# Patient Record
Sex: Female | Born: 1960 | Hispanic: No | Marital: Married | Smoking: Never smoker
Health system: Southern US, Community
[De-identification: ages and names within clinical notes are randomized; demographics above are authoritative.]

## PROBLEM LIST (undated history)

## (undated) DIAGNOSIS — D649 Anemia, unspecified: Secondary | ICD-10-CM

## (undated) DIAGNOSIS — Z9889 Other specified postprocedural states: Secondary | ICD-10-CM

## (undated) DIAGNOSIS — H269 Unspecified cataract: Secondary | ICD-10-CM

## (undated) DIAGNOSIS — G4730 Sleep apnea, unspecified: Secondary | ICD-10-CM

## (undated) DIAGNOSIS — R112 Nausea with vomiting, unspecified: Secondary | ICD-10-CM

## (undated) DIAGNOSIS — C801 Malignant (primary) neoplasm, unspecified: Secondary | ICD-10-CM

## (undated) DIAGNOSIS — K760 Fatty (change of) liver, not elsewhere classified: Secondary | ICD-10-CM

## (undated) DIAGNOSIS — E119 Type 2 diabetes mellitus without complications: Secondary | ICD-10-CM

## (undated) DIAGNOSIS — I10 Essential (primary) hypertension: Secondary | ICD-10-CM

## (undated) DIAGNOSIS — K219 Gastro-esophageal reflux disease without esophagitis: Secondary | ICD-10-CM

## (undated) DIAGNOSIS — J45909 Unspecified asthma, uncomplicated: Secondary | ICD-10-CM

## (undated) DIAGNOSIS — IMO0001 Reserved for inherently not codable concepts without codable children: Secondary | ICD-10-CM

## (undated) DIAGNOSIS — D259 Leiomyoma of uterus, unspecified: Secondary | ICD-10-CM

## (undated) DIAGNOSIS — M1990 Unspecified osteoarthritis, unspecified site: Secondary | ICD-10-CM

## (undated) DIAGNOSIS — T7840XA Allergy, unspecified, initial encounter: Secondary | ICD-10-CM

## (undated) DIAGNOSIS — Z531 Procedure and treatment not carried out because of patient's decision for reasons of belief and group pressure: Secondary | ICD-10-CM

## (undated) HISTORY — PX: BREAST EXCISIONAL BIOPSY: SUR124

## (undated) HISTORY — DX: Procedure and treatment not carried out because of patient's decision for reasons of belief and group pressure: Z53.1

## (undated) HISTORY — DX: Unspecified osteoarthritis, unspecified site: M19.90

## (undated) HISTORY — DX: Leiomyoma of uterus, unspecified: D25.9

## (undated) HISTORY — DX: Unspecified cataract: H26.9

## (undated) HISTORY — DX: Anemia, unspecified: D64.9

## (undated) HISTORY — DX: Allergy, unspecified, initial encounter: T78.40XA

## (undated) HISTORY — DX: Gastro-esophageal reflux disease without esophagitis: K21.9

## (undated) HISTORY — DX: Type 2 diabetes mellitus without complications: E11.9

## (undated) HISTORY — PX: BREAST CYST ASPIRATION: SHX578

## (undated) HISTORY — DX: Reserved for inherently not codable concepts without codable children: IMO0001

## (undated) HISTORY — DX: Fatty (change of) liver, not elsewhere classified: K76.0

## (undated) HISTORY — PX: COLONOSCOPY: SHX174

---

## 1998-08-21 ENCOUNTER — Other Ambulatory Visit: Admission: RE | Admit: 1998-08-21 | Discharge: 1998-08-21 | Payer: Self-pay | Admitting: Obstetrics and Gynecology

## 1998-08-22 ENCOUNTER — Other Ambulatory Visit: Admission: RE | Admit: 1998-08-22 | Discharge: 1998-08-22 | Payer: Self-pay | Admitting: Obstetrics and Gynecology

## 1998-08-22 ENCOUNTER — Encounter (INDEPENDENT_AMBULATORY_CARE_PROVIDER_SITE_OTHER): Payer: Self-pay | Admitting: Specialist

## 1999-10-16 ENCOUNTER — Other Ambulatory Visit: Admission: RE | Admit: 1999-10-16 | Discharge: 1999-10-16 | Payer: Self-pay | Admitting: Obstetrics and Gynecology

## 2000-11-11 ENCOUNTER — Other Ambulatory Visit: Admission: RE | Admit: 2000-11-11 | Discharge: 2000-11-11 | Payer: Self-pay | Admitting: Obstetrics and Gynecology

## 2000-11-18 ENCOUNTER — Encounter: Admission: RE | Admit: 2000-11-18 | Discharge: 2000-11-18 | Payer: Self-pay | Admitting: Obstetrics and Gynecology

## 2000-11-18 ENCOUNTER — Encounter: Payer: Self-pay | Admitting: Obstetrics and Gynecology

## 2000-12-10 ENCOUNTER — Encounter: Admission: RE | Admit: 2000-12-10 | Discharge: 2000-12-10 | Payer: Self-pay | Admitting: Obstetrics and Gynecology

## 2000-12-10 ENCOUNTER — Encounter: Payer: Self-pay | Admitting: Obstetrics and Gynecology

## 2001-12-15 ENCOUNTER — Other Ambulatory Visit: Admission: RE | Admit: 2001-12-15 | Discharge: 2001-12-15 | Payer: Self-pay | Admitting: Obstetrics and Gynecology

## 2002-12-19 ENCOUNTER — Other Ambulatory Visit: Admission: RE | Admit: 2002-12-19 | Discharge: 2002-12-19 | Payer: Self-pay | Admitting: Obstetrics and Gynecology

## 2004-01-04 ENCOUNTER — Ambulatory Visit: Payer: Self-pay | Admitting: Licensed Clinical Social Worker

## 2004-01-18 ENCOUNTER — Ambulatory Visit: Payer: Self-pay | Admitting: Licensed Clinical Social Worker

## 2004-06-24 ENCOUNTER — Ambulatory Visit: Payer: Self-pay | Admitting: Internal Medicine

## 2004-07-02 ENCOUNTER — Ambulatory Visit: Payer: Self-pay | Admitting: Internal Medicine

## 2004-09-01 ENCOUNTER — Ambulatory Visit: Payer: Self-pay | Admitting: Internal Medicine

## 2004-09-02 ENCOUNTER — Encounter: Admission: RE | Admit: 2004-09-02 | Discharge: 2004-09-02 | Payer: Self-pay | Admitting: Internal Medicine

## 2004-09-11 ENCOUNTER — Encounter: Admission: RE | Admit: 2004-09-11 | Discharge: 2004-09-11 | Payer: Self-pay | Admitting: Internal Medicine

## 2005-04-10 ENCOUNTER — Ambulatory Visit: Payer: Self-pay | Admitting: Internal Medicine

## 2005-04-28 ENCOUNTER — Ambulatory Visit: Payer: Self-pay | Admitting: Internal Medicine

## 2005-09-16 ENCOUNTER — Encounter: Admission: RE | Admit: 2005-09-16 | Discharge: 2005-09-16 | Payer: Self-pay | Admitting: Internal Medicine

## 2006-07-14 ENCOUNTER — Ambulatory Visit: Payer: Self-pay | Admitting: Internal Medicine

## 2006-07-14 LAB — CONVERTED CEMR LAB
ALT: 25 units/L (ref 0–40)
AST: 25 units/L (ref 0–37)
Albumin: 3.6 g/dL (ref 3.5–5.2)
Alkaline Phosphatase: 85 units/L (ref 39–117)
BUN: 7 mg/dL (ref 6–23)
Basophils Absolute: 0 10*3/uL (ref 0.0–0.1)
Basophils Relative: 0.6 % (ref 0.0–1.0)
Bilirubin, Direct: 0.1 mg/dL (ref 0.0–0.3)
CO2: 28 meq/L (ref 19–32)
Calcium: 9.4 mg/dL (ref 8.4–10.5)
Chloride: 104 meq/L (ref 96–112)
Cholesterol: 214 mg/dL (ref 0–200)
Creatinine, Ser: 0.7 mg/dL (ref 0.4–1.2)
Direct LDL: 128.9 mg/dL
Eosinophils Absolute: 0.6 10*3/uL (ref 0.0–0.6)
Eosinophils Relative: 8 % — ABNORMAL HIGH (ref 0.0–5.0)
GFR calc Af Amer: 115 mL/min
GFR calc non Af Amer: 95 mL/min
Glucose, Bld: 99 mg/dL (ref 70–99)
HCT: 39.6 % (ref 36.0–46.0)
HDL: 53.4 mg/dL (ref >39.0)
Hemoglobin: 13.3 g/dL (ref 12.0–15.0)
Lymphocytes Relative: 35.6 % (ref 12.0–46.0)
MCHC: 33.7 g/dL (ref 30.0–36.0)
MCV: 93.2 fL (ref 78.0–100.0)
Monocytes Absolute: 0.6 10*3/uL (ref 0.2–0.7)
Monocytes Relative: 8.9 % (ref 3.0–11.0)
Neutro Abs: 3.3 10*3/uL (ref 1.4–7.7)
Neutrophils Relative %: 46.9 % (ref 43.0–77.0)
Platelets: 306 10*3/uL (ref 150–400)
Potassium: 4.1 meq/L (ref 3.5–5.1)
RBC: 4.25 M/uL (ref 3.87–5.11)
RDW: 13 % (ref 11.5–14.6)
Sodium: 139 meq/L (ref 135–145)
TSH: 2.6 microintl units/mL (ref 0.35–5.50)
Total Bilirubin: 0.6 mg/dL (ref 0.3–1.2)
Total CHOL/HDL Ratio: 4
Total Protein: 7.3 g/dL (ref 6.0–8.3)
Triglycerides: 93 mg/dL (ref 0–149)
VLDL: 19 mg/dL (ref 0–40)
WBC: 7 10*3/uL (ref 4.5–10.5)

## 2006-07-21 ENCOUNTER — Ambulatory Visit: Payer: Self-pay | Admitting: Internal Medicine

## 2006-07-27 ENCOUNTER — Encounter: Admission: RE | Admit: 2006-07-27 | Discharge: 2006-07-27 | Payer: Self-pay | Admitting: Internal Medicine

## 2006-08-17 ENCOUNTER — Other Ambulatory Visit: Admission: RE | Admit: 2006-08-17 | Discharge: 2006-08-17 | Payer: Self-pay | Admitting: Obstetrics and Gynecology

## 2006-10-14 ENCOUNTER — Ambulatory Visit: Payer: Self-pay | Admitting: Internal Medicine

## 2006-10-14 DIAGNOSIS — L259 Unspecified contact dermatitis, unspecified cause: Secondary | ICD-10-CM | POA: Insufficient documentation

## 2007-04-05 ENCOUNTER — Encounter: Payer: Self-pay | Admitting: Internal Medicine

## 2007-05-03 ENCOUNTER — Encounter: Payer: Self-pay | Admitting: Internal Medicine

## 2007-07-28 ENCOUNTER — Encounter: Admission: RE | Admit: 2007-07-28 | Discharge: 2007-07-28 | Payer: Self-pay | Admitting: Internal Medicine

## 2007-12-13 ENCOUNTER — Other Ambulatory Visit: Admission: RE | Admit: 2007-12-13 | Discharge: 2007-12-13 | Payer: Self-pay | Admitting: Obstetrics and Gynecology

## 2008-03-27 ENCOUNTER — Ambulatory Visit: Payer: Self-pay | Admitting: Internal Medicine

## 2008-03-27 DIAGNOSIS — G4730 Sleep apnea, unspecified: Secondary | ICD-10-CM | POA: Insufficient documentation

## 2008-04-18 ENCOUNTER — Encounter: Payer: Self-pay | Admitting: Pulmonary Disease

## 2008-04-18 ENCOUNTER — Encounter: Payer: Self-pay | Admitting: Internal Medicine

## 2008-04-18 ENCOUNTER — Ambulatory Visit (HOSPITAL_BASED_OUTPATIENT_CLINIC_OR_DEPARTMENT_OTHER): Admission: RE | Admit: 2008-04-18 | Discharge: 2008-04-18 | Payer: Self-pay | Admitting: Internal Medicine

## 2008-04-28 ENCOUNTER — Ambulatory Visit: Payer: Self-pay | Admitting: Pulmonary Disease

## 2008-05-10 ENCOUNTER — Encounter: Payer: Self-pay | Admitting: Internal Medicine

## 2008-06-01 ENCOUNTER — Ambulatory Visit: Payer: Self-pay | Admitting: Pulmonary Disease

## 2008-06-01 DIAGNOSIS — J309 Allergic rhinitis, unspecified: Secondary | ICD-10-CM | POA: Insufficient documentation

## 2008-06-01 DIAGNOSIS — G4733 Obstructive sleep apnea (adult) (pediatric): Secondary | ICD-10-CM | POA: Insufficient documentation

## 2008-06-01 DIAGNOSIS — I10 Essential (primary) hypertension: Secondary | ICD-10-CM | POA: Insufficient documentation

## 2008-06-01 DIAGNOSIS — R51 Headache: Secondary | ICD-10-CM

## 2008-06-01 DIAGNOSIS — R519 Headache, unspecified: Secondary | ICD-10-CM | POA: Insufficient documentation

## 2008-07-04 ENCOUNTER — Ambulatory Visit: Payer: Self-pay | Admitting: Pulmonary Disease

## 2008-07-30 ENCOUNTER — Encounter: Admission: RE | Admit: 2008-07-30 | Discharge: 2008-07-30 | Payer: Self-pay | Admitting: Obstetrics and Gynecology

## 2008-08-01 ENCOUNTER — Telehealth (INDEPENDENT_AMBULATORY_CARE_PROVIDER_SITE_OTHER): Payer: Self-pay | Admitting: *Deleted

## 2008-08-12 ENCOUNTER — Ambulatory Visit: Payer: Self-pay | Admitting: Diagnostic Radiology

## 2008-08-12 ENCOUNTER — Emergency Department (HOSPITAL_BASED_OUTPATIENT_CLINIC_OR_DEPARTMENT_OTHER): Admission: EM | Admit: 2008-08-12 | Discharge: 2008-08-12 | Payer: Self-pay | Admitting: Emergency Medicine

## 2008-09-03 ENCOUNTER — Telehealth: Payer: Self-pay | Admitting: Internal Medicine

## 2008-10-23 ENCOUNTER — Ambulatory Visit: Payer: Self-pay | Admitting: Family Medicine

## 2008-11-20 ENCOUNTER — Encounter: Payer: Self-pay | Admitting: Pulmonary Disease

## 2009-01-16 ENCOUNTER — Ambulatory Visit: Payer: Self-pay | Admitting: Internal Medicine

## 2009-01-18 ENCOUNTER — Encounter: Admission: RE | Admit: 2009-01-18 | Discharge: 2009-01-18 | Payer: Self-pay | Admitting: Internal Medicine

## 2009-01-21 LAB — CONVERTED CEMR LAB
Alkaline Phosphatase: 108 units/L (ref 39–117)
BUN: 8 mg/dL (ref 6–23)
Basophils Absolute: 0 10*3/uL (ref 0.0–0.1)
Basophils Relative: 0.5 % (ref 0.0–3.0)
Calcium: 9.1 mg/dL (ref 8.4–10.5)
Creatinine, Ser: 0.7 mg/dL (ref 0.4–1.2)
Eosinophils Relative: 7.4 % — ABNORMAL HIGH (ref 0.0–5.0)
GFR calc non Af Amer: 94.25 mL/min (ref >60)
HCT: 38.4 % (ref 36.0–46.0)
Hemoglobin: 12.9 g/dL (ref 12.0–15.0)
Lymphocytes Relative: 32.4 % (ref 12.0–46.0)
MCV: 96.1 fL (ref 78.0–100.0)
Monocytes Absolute: 0.7 10*3/uL (ref 0.1–1.0)
Monocytes Relative: 9.2 % (ref 3.0–12.0)
Neutro Abs: 3.6 10*3/uL (ref 1.4–7.7)
Neutrophils Relative %: 50.5 % (ref 43.0–77.0)
Platelets: 255 10*3/uL (ref 150.0–400.0)
Total Bilirubin: 0.6 mg/dL (ref 0.3–1.2)

## 2009-01-22 ENCOUNTER — Ambulatory Visit: Payer: Self-pay | Admitting: Internal Medicine

## 2009-01-24 LAB — CONVERTED CEMR LAB
ALT: 51 units/L — ABNORMAL HIGH (ref 0–35)
Albumin: 3.5 g/dL (ref 3.5–5.2)
Alkaline Phosphatase: 94 units/L (ref 39–117)

## 2009-02-07 ENCOUNTER — Encounter: Payer: Self-pay | Admitting: Internal Medicine

## 2009-02-16 HISTORY — PX: CHOLECYSTECTOMY: SHX55

## 2009-03-07 ENCOUNTER — Encounter: Payer: Self-pay | Admitting: Pulmonary Disease

## 2009-03-08 ENCOUNTER — Ambulatory Visit (HOSPITAL_COMMUNITY): Admission: RE | Admit: 2009-03-08 | Discharge: 2009-03-08 | Payer: Self-pay | Admitting: Surgery

## 2009-03-13 ENCOUNTER — Encounter (INDEPENDENT_AMBULATORY_CARE_PROVIDER_SITE_OTHER): Payer: Self-pay | Admitting: Surgery

## 2009-03-13 ENCOUNTER — Ambulatory Visit (HOSPITAL_COMMUNITY): Admission: RE | Admit: 2009-03-13 | Discharge: 2009-03-14 | Payer: Self-pay | Admitting: Surgery

## 2009-06-17 ENCOUNTER — Ambulatory Visit: Payer: Self-pay | Admitting: Pulmonary Disease

## 2009-07-31 ENCOUNTER — Ambulatory Visit: Payer: Self-pay | Admitting: Diagnostic Radiology

## 2009-07-31 ENCOUNTER — Ambulatory Visit (HOSPITAL_BASED_OUTPATIENT_CLINIC_OR_DEPARTMENT_OTHER): Admission: RE | Admit: 2009-07-31 | Discharge: 2009-07-31 | Payer: Self-pay | Admitting: Obstetrics and Gynecology

## 2009-10-16 ENCOUNTER — Ambulatory Visit: Payer: Self-pay

## 2009-10-16 ENCOUNTER — Ambulatory Visit: Payer: Self-pay | Admitting: Internal Medicine

## 2009-10-16 DIAGNOSIS — R609 Edema, unspecified: Secondary | ICD-10-CM | POA: Insufficient documentation

## 2009-10-18 ENCOUNTER — Telehealth: Payer: Self-pay | Admitting: Internal Medicine

## 2009-10-22 ENCOUNTER — Ambulatory Visit: Payer: Self-pay

## 2009-10-22 ENCOUNTER — Encounter: Payer: Self-pay | Admitting: Internal Medicine

## 2009-10-23 ENCOUNTER — Telehealth: Payer: Self-pay | Admitting: Internal Medicine

## 2009-10-24 ENCOUNTER — Telehealth (INDEPENDENT_AMBULATORY_CARE_PROVIDER_SITE_OTHER): Payer: Self-pay | Admitting: *Deleted

## 2009-10-30 ENCOUNTER — Ambulatory Visit: Payer: Self-pay | Admitting: Internal Medicine

## 2009-11-01 ENCOUNTER — Ambulatory Visit: Payer: Self-pay | Admitting: Cardiology

## 2009-11-21 ENCOUNTER — Telehealth: Payer: Self-pay | Admitting: Internal Medicine

## 2009-11-26 ENCOUNTER — Ambulatory Visit: Payer: Self-pay | Admitting: Internal Medicine

## 2009-11-26 DIAGNOSIS — E669 Obesity, unspecified: Secondary | ICD-10-CM | POA: Insufficient documentation

## 2010-01-07 ENCOUNTER — Telehealth: Payer: Self-pay | Admitting: Internal Medicine

## 2010-02-24 ENCOUNTER — Ambulatory Visit
Admission: RE | Admit: 2010-02-24 | Discharge: 2010-02-24 | Payer: Self-pay | Source: Home / Self Care | Attending: Internal Medicine | Admitting: Internal Medicine

## 2010-02-24 ENCOUNTER — Other Ambulatory Visit: Payer: Self-pay | Admitting: Internal Medicine

## 2010-02-26 ENCOUNTER — Telehealth (INDEPENDENT_AMBULATORY_CARE_PROVIDER_SITE_OTHER): Payer: Self-pay | Admitting: *Deleted

## 2010-02-28 LAB — BASIC METABOLIC PANEL
BUN: 14 mg/dL (ref 6–23)
CO2: 32 mEq/L (ref 19–32)
Calcium: 9.2 mg/dL (ref 8.4–10.5)
Chloride: 96 mEq/L (ref 96–112)
Creatinine, Ser: 0.8 mg/dL (ref 0.4–1.2)
GFR: 93.27 mL/min (ref >60.00)
Glucose, Bld: 109 mg/dL — ABNORMAL HIGH (ref 70–99)
Potassium: 4.6 mEq/L (ref 3.5–5.1)
Sodium: 136 mEq/L (ref 135–145)

## 2010-02-28 LAB — URIC ACID: Uric Acid, Serum: 7.5 mg/dL — ABNORMAL HIGH (ref 2.4–7.0)

## 2010-03-12 ENCOUNTER — Telehealth: Payer: Self-pay | Admitting: Internal Medicine

## 2010-03-16 LAB — CONVERTED CEMR LAB
AST: 26 units/L (ref 0–37)
Albumin: 3.2 g/dL — ABNORMAL LOW (ref 3.5–5.2)
Basophils Absolute: 0 10*3/uL (ref 0.0–0.1)
Bilirubin, Direct: 0.1 mg/dL (ref 0.0–0.3)
CO2: 31 meq/L (ref 19–32)
Calcium: 8.9 mg/dL (ref 8.4–10.5)
Creatinine, Ser: 0.6 mg/dL (ref 0.4–1.2)
Eosinophils Absolute: 0.4 10*3/uL (ref 0.0–0.7)
Eosinophils Relative: 4.5 % (ref 0.0–5.0)
Ferritin: 8.9 ng/mL — ABNORMAL LOW (ref 10.0–291.0)
Hemoglobin: 11.8 g/dL — ABNORMAL LOW (ref 12.0–15.0)
Lymphocytes Relative: 31.1 % (ref 12.0–46.0)
MCHC: 32.8 g/dL (ref 30.0–36.0)
MCV: 91.3 fL (ref 78.0–100.0)
Neutro Abs: 5.2 10*3/uL (ref 1.4–7.7)
Platelets: 293 10*3/uL (ref 150.0–400.0)
Potassium: 4 meq/L (ref 3.5–5.1)
RBC: 3.94 M/uL (ref 3.87–5.11)
Sodium: 139 meq/L (ref 135–145)
Total Bilirubin: 0.2 mg/dL — ABNORMAL LOW (ref 0.3–1.2)
WBC: 9.7 10*3/uL (ref 4.5–10.5)

## 2010-03-18 NOTE — Assessment & Plan Note (Signed)
Summary: SWELLING CONTINUES/PS   Vital Signs:  Patient profile:   50 year old female Menstrual status:  irregular Weight:      194 pounds Temp:     98.4 degrees F BP sitting:   148 / 100  Vitals Entered By: Lynann Beaver CMA (November 26, 2009 8:04 AM) CC: Swellling continues Is Patient Diabetic? No Pain Assessment Patient in pain? no        Primary Care Provider:  Birdie Sons MD  CC:  Swellling continues.  History of Present Illness:  Follow-Up Visit      This is a 50 year old woman who presents for Follow-up visit.  The patient denies chest pain and palpitations.  Since the last visit the patient notes no new problems or concerns she continues to complain of swelling in feet. reviewed previous notes and lab work. She reports 2 days ago swelling in feet was bad The patient reports taking meds as prescribed.  When questioned about possible medication side effects, the patient notes none.    no CP, SOB All other systems reviewed and were negative   Current Problems (verified): 1)  Edema- Localized  (ICD-782.3) 2)  Obstructive Sleep Apnea  (ICD-327.23) 3)  Headache, Chronic  (ICD-784.0) 4)  Hypertension  (ICD-401.9) 5)  Allergic Rhinitis  (ICD-477.9) 6)  Sleep Apnea  (ICD-780.57) 7)  Contact Dermatitis  (ICD-692.9)  Current Medications (verified): 1)  Excedrin Extra Strength 250-250-65 Mg Tabs (Aspirin-Acetaminophen-Caffeine) .... As Needed 2)  Ferrous Sulfate 325 (65 Fe) Mg Tabs (Ferrous Sulfate) .... By Mouth Bid  Allergies (verified): No Known Drug Allergies  Past History:  Past Medical History: NO BLOOD PRODUCTS breast cysts HEADACHE, CHRONIC (ICD-784.0) HYPERTENSION (ICD-401.9) ALLERGIC RHINITIS (ICD-477.9) SLEEP APNEA (ICD-780.57)    Family History: asthma: mother, sister, brothers heart disease: father deceased with heart disease DM--father and brother  Physical Exam  General:  alert and well-developed.   Head:  normocephalic and atraumatic.     Eyes:  pupils equal and pupils round.   Ears:  R ear normal and L ear normal.   Neck:  No deformities, masses, or tenderness noted. Chest Wall:  No deformities, masses, or tenderness noted. Lungs:  Normal respiratory effort, chest expands symmetrically. Lungs are clear to auscultation, no crackles or wheezes. Heart:  Normal rate and regular rhythm. S1 and S2 normal without gallop, murmur, click, rub or other extra sounds. Abdomen:  Bowel sounds positive,abdomen soft and non-tender without masses, organomegaly or hernias noted. overweight Skin:  turgor normal and color normal.     Impression & Recommendations:  Problem # 1:  EDEMA- LOCALIZED (ICD-782.3)  discussed I doubt of significant concern, she has essentially no swelling today hctz---side effects discussed  Her updated medication list for this problem includes:    Hydrochlorothiazide 25 Mg Tabs (Hydrochlorothiazide) .Marland Kitchen... Take 1 tab by mouth every morning  Problem # 2:  HYPERTENSION (ICD-401.9)  not treated need to treat today main treatment should be weight loss  BP today: 148/100 Prior BP: 142/86 (10/16/2009)  Labs Reviewed: K+: 4.0 (10/16/2009) Creat: : 0.6 (10/16/2009)   Chol: 214 (07/14/2006)   HDL: 53.4 (07/14/2006)   LDL: DEL (07/14/2006)   TG: 93 (07/14/2006)  Her updated medication list for this problem includes:    Hydrochlorothiazide 25 Mg Tabs (Hydrochlorothiazide) .Marland Kitchen... Take 1 tab by mouth every morning  Problem # 3:  OBESITY (ICD-278.00)  discussed need for weight loss she would like to learn more about optifast  Orders: Nutrition Referral (Nutrition)  Complete Medication  List: 1)  Excedrin Extra Strength 250-250-65 Mg Tabs (Aspirin-acetaminophen-caffeine) .... As needed 2)  Ferrous Sulfate 325 (65 Fe) Mg Tabs (Ferrous sulfate) .... By mouth bid 3)  Hydrochlorothiazide 25 Mg Tabs (Hydrochlorothiazide) .... Take 1 tab by mouth every morning  Patient Instructions: 1)  see me 6  weeks Prescriptions: HYDROCHLOROTHIAZIDE 25 MG  TABS (HYDROCHLOROTHIAZIDE) Take 1 tab by mouth every morning  #90 x 3   Entered and Authorized by:   Birdie Sons MD   Signed by:   Birdie Sons MD on 11/26/2009   Method used:   Electronically to        [facility] [address] [id number]* (retail)       [address]       [place], [place]  [address]       Ph: [phone]       Fax: [phone]   RxID:   [id number]

## 2010-03-18 NOTE — Progress Notes (Signed)
Summary: doppler results LMTCB 9-9  Phone Note Call from Patient   Caller: Patient Call For: Birdie Sons MD Summary of Call: Pt is asking for doppler results.  Called to have results faxed today. 10/24/[phone] Initial call taken by: Lynann Beaver CMA,  October 23, 2009 1:05 PM  Follow-up for Phone Call        Pt is calling back for results and to let Dr. Cato Mulligan know her arm is no better. Follow-up by: Lynann Beaver CMA,  October 24, 2009 11:00 AM  Additional Follow-up for Phone Call Additional follow up Details #1::        ct chest with contrast -- subclavian vein stenosis/clot  doppler ultrasonund ok Additional Follow-up by: Birdie Sons MD,  October 24, 2009 6:00 PM    Additional Follow-up for Phone Call Additional follow up Details #2::    LMTCB Rudy Jew, RN  October 25, 2009 8:23 AM Scheduler called & is waiting on CXR before the CT can be scheduled.   Patient also given info that leg doppers were okay. Follow-up by: Rudy Jew, RN,  October 25, 2009 2:33 PM

## 2010-03-18 NOTE — Assessment & Plan Note (Signed)
Summary: rov for osa   Copy to:  Birdie Sons Primary Provider/Referring Provider:  Birdie Sons MD  CC:  Pt is here for 1 yr f/u appt.  Pt states she is only wearing her cpap machine 1 night per week. Pt states she is having "difficulty adjusting to machine."  pt wonders if the mask could be causing this.    .  History of Present Illness: the pt comes in today for f/u of her moderate osa.  She is on cpap, but only wearing for 2hrs a night except for an occasional night.  She is able to get to sleep easily with the ramp, but once she awakens during the night, she is unable to return to sleep until she pulls the mask off.  She is NOT using the ramp button during the night to help with return to sleep, and also feels that she would like to try different mask that doesn't cover face.  Overall, she is not sure if she will be able to tolerate this treatment longterm.  Her weight is up 3 pounds since last visit.  Current Medications (verified): 1)  Excedrin Extra Strength 250-250-65 Mg Tabs (Aspirin-Acetaminophen-Caffeine) .... As Needed  Allergies (verified): No Known Drug Allergies  Review of Systems      See HPI  Vital Signs:  Patient profile:   50 year old female Menstrual status:  irregular Height:      63 inches Weight:      195.25 pounds BMI:     34.71 O2 Sat:      92 % on Room air Temp:     98.2 degrees F oral Pulse rate:   99 / minute BP sitting:   144 / 98  (left arm) Cuff size:   large  Vitals Entered By: Arman Filter LPN (Jun 17, 8336 9:58 AM)  O2 Flow:  Room air CC: Pt is here for 1 yr f/u appt.  Pt states she is only wearing her cpap machine 1 night per week. Pt states she is having "difficulty adjusting to machine."  pt wonders if the mask could be causing this.     Comments Medications reviewed with patient Arman Filter LPN  Jun 18, 2503 9:58 AM    Physical Exam  General:  obese female in nad Nose:  no skin breakdown or pressure necrosis   Impression &  Recommendations:  Problem # 1:  OBSTRUCTIVE SLEEP APNEA (ICD-327.23) the pt has been noncompliant with cpap use, and feels it is possibly due to mask or pressure intolerance.  She is getting to sleep fine using ramp, but has not been using to return to sleep.  After a long discussion, the pt isn't sure this is a viable treatment for her, but would like to try a different mask.  I also stressed to her the importance of weight loss.  I have also discussed with her different treatment options, including nocturnal oxygen (won't help her sleep better or be more alert the next day) and dental appliance.  I have given her an information sheet on dental appliance to consider.  Time spent with pt today was .  Other Orders: Est. Patient Level III (39767) DME Referral (DME)  Patient Instructions: 1)  will get dme to show you different types of masks.  If you are unable to keep mouth closed during sleep, you may need a chin strap with a nasal mask. 2)  use ramp button to help you get back to sleep at  night once you awaken. 3)  if unable to adjust to cpap, can consider oxygen at night vs. trying dental appliance 4)  work on weight loss 5)  please call and give [place] update as to your progress in 3 weeks.  If doing well, will see you again in one year.

## 2010-03-18 NOTE — Assessment & Plan Note (Signed)
Summary: arm pain/dm   Vital Signs:  Patient profile:   50 year old female Menstrual status:  irregular Temp:     98.6 degrees F oral Pulse rate:   88 / minute Pulse rhythm:   regular Resp:     12 per minute BP sitting:   142 / 86  (left arm) Cuff size:   regular  Vitals Entered By: Gladis Riffle, RN (October 16, 2009 11:16 AM) CC: c/o pain under left arm x 10 days, worse last 6 days with swelling Is Patient Diabetic? No Pain Assessment Patient in pain? yes     Location: under left arm Intensity: 3 Type: aching Onset of pain  10 days ago   Primary Care Provider:  Birdie Sons MD  CC:  c/o pain under left arm x 10 days and worse last 6 days with swelling.  History of Present Illness: 10 days ago---discomfort under left arm has noted a knot under left arm which has now "spread" down medial aspect of arm minimal pain. No trauma. No erythema. No fever.  In addition she complains of new onset lower extremity edema. She relates this to the same time he has when she had a difficulty with her arm. She has no pain in her legs. No other concerns related to her legs.  She denies any complaints and a complete review of systems.  Preventive Screening-Counseling & Management  Alcohol-Tobacco     Smoking Status: never  Current Medications (verified): 1)  Excedrin Extra Strength 250-250-65 Mg Tabs (Aspirin-Acetaminophen-Caffeine) .... As Needed  Allergies (verified): No Known Drug Allergies  Past History:  Past Surgical History: Cholecystectomy--january 2011  Physical Exam  General:  well-developed well-nourished female in no acute distress. HEENT exam atraumatic, normocephalic neck is supple without lymphadenopathy, thyromegaly, jugular venous distention or carotid bruits. Chest her to auscultation. Cardiac exam S1-S2 are normal without murmurs or chills. Lymph node exam there's no axillary adenopathy. No cervical adenopathy or inguinal adenopathy. She does have a venous cord  palpable from her left axilla to mid brachial vein area. The left is alert and oriented.   Impression & Recommendations:  Problem # 1:  ACUTE VENOUS EMBO & THROMB DEEP VEINS UPPER EXT (ICD-453.82) exam clear to me whether she has a subclavian DVT or this is more of a superficial thrombophlebitis. I think she needs further evaluation. Schedule Doppler exam while she's in the office. Orders: Specimen Handling ([address]) Doppler Referral (Doppler)  Problem # 2:  EDEMA- LOCALIZED (ICD-782.3) she complains of bilateral or edema. Unclear etiology. On examination the edema is not very impressive. Will workup the possibility of an upper extremity DVT. Watchful waiting in regards to the edema. Orders: Venipuncture ([address]) Specimen Handling (09811) TLB-BMP (Basic Metabolic Panel-BMET) (80048-METABOL) TLB-CBC Platelet - w/Differential (85025-CBCD) TLB-Hepatic/Liver Function Pnl (80076-HEPATIC)  Complete Medication List: 1)  Excedrin Extra Strength 250-250-65 Mg Tabs (Aspirin-acetaminophen-caffeine) .... As needed 2)  Ferrous Sulfate 325 (65 Fe) Mg Tabs (Ferrous sulfate) .... By mouth bid

## 2010-03-18 NOTE — Miscellaneous (Signed)
Summary: Orders Update  Clinical Lists Changes  Orders: Added new Test order of Venous Duplex Upper Extremity (Venous Duplex Upp) - Signed 

## 2010-03-18 NOTE — Progress Notes (Signed)
Summary: US dopplers    Phone Note Call from Patient Call back at [phone]   Summary of Call: Feet are swollen consistently since Sunday.  No fluid pill.  Has cut out salt.  Hard to elevate at work.   Initial call taken by: Rudy Jew, RN,  October 18, 2009 12:01 PM  Follow-up for Phone Call        she needs further evaluation. Unclear to me what is causing the lower extremity edema. I thank somehow it must be related to the upper extremity blood clot that we discussed earlier. I like her to go for ultrasound Doppler of both lower extremities. Evaluate for DVT. If that is unrevealing then I think she needs a CT of her abdomen and pelvis to look for something that might be impeding blood flow of her legs. Follow-up by: Birdie Sons MD,  October 18, 2009 1:48 PM  Additional Follow-up for Phone Call Additional follow up Details #1::        Phone Call Completed Additional Follow-up by: Rudy Jew, RN,  October 18, 2009 2:07 PM

## 2010-03-18 NOTE — Letter (Signed)
Summary: CMN for CPAP Supplies/HCS  CMN for CPAP Supplies/HCS   Imported By: Sherian Rein 03/13/2009 15:13:08  _____________________________________________________________________  External Attachment:    Type:   Image     Comment:   External Document

## 2010-03-18 NOTE — Progress Notes (Signed)
   Preliminary Report of Doppler faxed to De[facility]/ [facility]. Cala Bradford [place]  October 24, 2009 11:40 AM'

## 2010-03-18 NOTE — Miscellaneous (Signed)
Summary: Orders Update  Clinical Lists Changes  Orders: Added new Test order of Venous Duplex Lower Extremity (Venous Duplex Lower) - Signed 

## 2010-03-18 NOTE — Progress Notes (Signed)
Summary: feet swelling  Phone Note Call from Patient Call back at Home Phone [phone]   Caller: Patient Call For: swords Summary of Call: feet is swelling again taking HCTZ 25mg .  Should she increase? Initial call taken by: Alfred Levins, CMA,  January 07, 2010 9:58 AM  Follow-up for Phone Call        no need to increase hydrochlorothiazide. If her feet swelling is worse I'd like to reassess. Schedule office visit, no urgency. Follow-up by: Birdie Sons MD,  January 08, 2010 7:21 AM  Additional Follow-up for Phone Call Additional follow up Details #1::        l/m to call and make appt Additional Follow-up by: Alfred Levins, CMA,  January 08, 2010 10:43 AM

## 2010-03-18 NOTE — Progress Notes (Signed)
Summary: swelling  Phone Note Call from Patient   Caller: Patient Call For: Yolanda Sons MD Summary of Call: Pt wants to know what kinds of things were ruled out from her lab work as her feet are still swollen, and what to do next? [phone] Initial call taken by: Lynann Beaver CMA,  November 21, 2009 2:20 PM  Follow-up for Phone Call        liver disease , kidney disease, anemia  make f/u appt to review swelling Follow-up by: Yolanda Sons MD,  November 25, 2009 9:07 AM  Additional Follow-up for Phone Call Additional follow up Details #1::        Phone Call Completed Additional Follow-up by: Rudy Jew, RN,  November 25, 2009 1:54 PM

## 2010-03-18 NOTE — Letter (Signed)
Summary: [facility]   Imported By: Maryln Gottron 02/28/2009 13:37:48  _____________________________________________________________________  External Attachment:    Type:   Image     Comment:   External Document

## 2010-03-20 NOTE — Progress Notes (Signed)
  Phone Note Outgoing Call   Call placed by: Gwenn T. Call placed to: Patient Details for Reason: LABS Summary of Call: Called and left a message because I don't think she came to the lab on 02-24-10. Waiting to hear back.  Follow-up for Phone Call        Patient called and said she would be in for labs on Friday. Follow-up by: Pryor Curia T.

## 2010-03-20 NOTE — Progress Notes (Signed)
Summary: lab question  Phone Note Call from Patient Call back at Home Phone [phone]   Caller: Patient Call For: swords Summary of Call: I finally got a hold of pt to tell her about her lab results.  Her uric acid was elevated and she said "what gout?"  Her OV was for a BP check on 02/24/10 Initial call taken by: Alfred Levins, CMA,  March 12, 2010 4:45 PM  Follow-up for Phone Call         no need to worry about the elevated uric acid level.  I would recommend weight loss. Follow-up by: Birdie Sons MD,  March 13, 2010 5:37 AM  Additional Follow-up for Phone Call Additional follow up Details #1::        pt aware Additional Follow-up by: Alfred Levins, CMA,  March 13, 2010 1:34 PM

## 2010-03-20 NOTE — Assessment & Plan Note (Signed)
Summary: 6 WK FUP/CJR pt rsc/njr   Vital Signs:  Patient profile:   50 year old female Menstrual status:  irregular Weight:      191 pounds BMI:     33.96 Temp:     98.6 degrees F oral Pulse rate:   84 / minute Pulse rhythm:   regular BP sitting:   122 / 86  (left arm) Cuff size:   large  Vitals Entered By: Alfred Levins, CMA (February 24, 2010 8:40 AM) CC: f/u on bp, C-V Risk Management   Primary Care Provider:  Birdie Sons MD  CC:  f/u on bp and C-V Risk Management.  History of Present Illness: Recentbronchitis---treated with prednisone---doing better  htn---tolerating meds without difficulty  edema--had been intermittent---resolved  All other systems reviewed and were negative   Cardiovascular Risk History:      Positive major cardiovascular risk factors include hypertension.  Negative major cardiovascular risk factors include female age less than 42 years old and non-tobacco-user status.    Current Medications (verified): 1)  Excedrin Extra Strength 250-250-65 Mg Tabs (Aspirin-Acetaminophen-Caffeine) .... As Needed 2)  Hydrochlorothiazide 25 Mg  Tabs (Hydrochlorothiazide) .... Take 1 Tab By Mouth Every Morning 3)  Prednisone 20 Mg Tabs (Prednisone) .Marland Kitchen.. 1 By Mouth Once Daily  Allergies (verified): No Known Drug Allergies  Past History:  Past Medical History: Last updated: 11/26/2009 NO BLOOD PRODUCTS breast cysts HEADACHE, CHRONIC (ICD-784.0) HYPERTENSION (ICD-401.9) ALLERGIC RHINITIS (ICD-477.9) SLEEP APNEA (ICD-780.57)    Past Surgical History: Last updated: 10/16/2009 Cholecystectomy--january 2011  Family History: Last updated: 11/26/2009 asthma: mother, sister, brothers heart disease: father deceased with heart disease DM--father and brother  Social History: Last updated: 06/01/2008 Religion affecting [employer] blood products pt is married. Pt does not have any children. pt works in Warehouse manager.   Risk Factors: Smoking Status: never  (10/16/2009)  Physical Exam  General:   overweight female in no acute distress. HEENT exam atraumatic, normocephalic symmetrical muscles are intact. Neck is supple. Chest  with few expiratory wheezes. cardiac exam S1-S2 are regular.  abdominal exam overweight white female sounds, soft. Extremities no edema.   Impression & Recommendations:  Problem # 1:  HYPERTENSION (ICD-401.9) Assessment Improved  adequate control. Continue current medications. Her updated medication list for this problem includes:    Hydrochlorothiazide 25 Mg Tabs (Hydrochlorothiazide) .Marland Kitchen... Take 1 tab by mouth every morning  BP today: 122/86 Prior BP: 148/100 (11/26/2009)  Labs Reviewed: K+: 4.0 (10/16/2009) Creat: : 0.6 (10/16/2009)   Chol: 214 (07/14/2006)   HDL: 53.4 (07/14/2006)   LDL: DEL (07/14/2006)   TG: 93 (07/14/2006)  Problem # 2:  EDEMA- LOCALIZED (ICD-782.3) Assessment: Improved  Her updated medication list for this problem includes:    Hydrochlorothiazide 25 Mg Tabs (Hydrochlorothiazide) .Marland Kitchen... Take 1 tab by mouth every morning  Problem # 3:  ACUTE BRONCHOSPASM (ICD-519.11) Assessment: Unchanged probably improving  trial proair as needed wheezing  Complete Medication List: 1)  Excedrin Extra Strength 250-250-65 Mg Tabs (Aspirin-acetaminophen-caffeine) .... As needed 2)  Hydrochlorothiazide 25 Mg Tabs (Hydrochlorothiazide) .... Take 1 tab by mouth every morning 3)  Proair Hfa 108 (90 Base) Mcg/act Aers (Albuterol sulfate) .... 2 puffs every 6 hours as needed for wheezing or shortness of breath  Other Orders: Venipuncture (04540)  Cardiovascular Risk Assessment/Plan:      The patient's hypertensive risk group is category A: No risk factors and no target organ damage.  Today's blood pressure is 122/86.     Patient Instructions: 1)  Please schedule a  follow-up appointment in 6 months. Prescriptions: PROAIR HFA 108 (90 BASE) MCG/ACT  AERS (ALBUTEROL SULFATE) 2 puffs every 6 hours as needed  for wheezing or shortness of breath  #1 x 0   Entered and Authorized by:   Birdie Sons MD   Signed by:   Birdie Sons MD on 02/24/2010   Method used:   Electronically to        [facility] [address] [id number]* (retail)       [address]       [place], [place]  [address]       Ph: [phone]       Fax: [phone]   RxID:   [id number]    Orders Added: 1)  Est. Patient Level III [44010] 2)  Venipuncture [27253] 3)  Est. Patient Level IV [66440]

## 2010-05-04 LAB — COMPREHENSIVE METABOLIC PANEL
Albumin: 3.6 g/dL (ref 3.5–5.2)
Alkaline Phosphatase: 97 U/L (ref 39–117)
BUN: 9 mg/dL (ref 6–23)
Calcium: 9.2 mg/dL (ref 8.4–10.5)
Chloride: 101 mEq/L (ref 96–112)
Creatinine, Ser: 0.65 mg/dL (ref 0.4–1.2)
GFR calc Af Amer: >60 mL/min (ref >60)
Glucose, Bld: 158 mg/dL — ABNORMAL HIGH (ref 70–99)
Potassium: 4.5 mEq/L (ref 3.5–5.1)
Sodium: 139 mEq/L (ref 135–145)
Total Bilirubin: 0.3 mg/dL (ref 0.3–1.2)

## 2010-05-04 LAB — DIFFERENTIAL
Basophils Relative: 0 % (ref 0–1)
Eosinophils Absolute: 0.4 10*3/uL (ref 0.0–0.7)
Lymphs Abs: 2.6 10*3/uL (ref 0.7–4.0)
Neutro Abs: 4.3 10*3/uL (ref 1.7–7.7)

## 2010-05-04 LAB — URINALYSIS, ROUTINE W REFLEX MICROSCOPIC
Bilirubin Urine: NEGATIVE
Nitrite: NEGATIVE
Specific Gravity, Urine: 1.017 (ref 1.005–1.030)

## 2010-05-04 LAB — CBC
HCT: 38.5 % (ref 36.0–46.0)
Hemoglobin: 12.6 g/dL (ref 12.0–15.0)
RBC: 4.13 MIL/uL (ref 3.87–5.11)
WBC: 8 10*3/uL (ref 4.0–10.5)

## 2010-05-04 LAB — URINE MICROSCOPIC-ADD ON

## 2010-05-09 ENCOUNTER — Other Ambulatory Visit: Payer: Self-pay | Admitting: *Deleted

## 2010-05-09 MED ORDER — HYDROCHLOROTHIAZIDE 25 MG PO TABS: 25.0000 mg | ORAL_TABLET | Freq: Every day | ORAL | Status: DC

## 2010-05-14 ENCOUNTER — Other Ambulatory Visit: Payer: Self-pay | Admitting: *Deleted

## 2010-05-26 LAB — DIFFERENTIAL
Lymphocytes Relative: 32 % (ref 12–46)
Lymphs Abs: 2.8 10*3/uL (ref 0.7–4.0)
Monocytes Relative: 7 % (ref 3–12)
Neutro Abs: 4.6 10*3/uL (ref 1.7–7.7)
Neutrophils Relative %: 53 % (ref 43–77)

## 2010-05-26 LAB — BASIC METABOLIC PANEL
CO2: 31 mEq/L (ref 19–32)
Chloride: 103 mEq/L (ref 96–112)
Creatinine, Ser: 0.8 mg/dL (ref 0.4–1.2)
GFR calc Af Amer: >60 mL/min (ref >60)
GFR calc non Af Amer: >60 mL/min (ref >60)
Potassium: 4.5 mEq/L (ref 3.5–5.1)
Sodium: 141 mEq/L (ref 135–145)

## 2010-05-26 LAB — TSH: TSH: 2.073 u[IU]/mL (ref 0.350–4.500)

## 2010-05-26 LAB — CBC
MCV: 92.6 fL (ref 78.0–100.0)
Platelets: 267 10*3/uL (ref 150–400)
RBC: 4.35 MIL/uL (ref 3.87–5.11)

## 2010-05-26 LAB — POCT CARDIAC MARKERS
CKMB, poc: <1 ng/mL — ABNORMAL LOW (ref 1.0–8.0)
Myoglobin, poc: 33.9 ng/mL (ref 12–200)
Troponin i, poc: <0.05 ng/mL (ref 0.00–0.09)

## 2010-05-26 LAB — T4: T4, Total: 8.2 ug/dL (ref 5.0–12.5)

## 2010-06-30 ENCOUNTER — Other Ambulatory Visit: Payer: Self-pay | Admitting: Internal Medicine

## 2010-06-30 DIAGNOSIS — Z1231 Encounter for screening mammogram for malignant neoplasm of breast: Secondary | ICD-10-CM

## 2010-07-01 NOTE — Procedures (Signed)
[name], Yolanda NO.:  [id number]   MEDICAL RECORD NO.:  [id number]          PATIENT TYPE:  OUT   LOCATION:  SLEEP CENTER                 FACILITY[facility]   PHYSICIAN:  Barbaraann Share, MD,FCCPDATE OF BIRTH:  [birth year]   DATE OF STUDY:  04/18/2008                            NOCTURNAL POLYSOMNOGRAM   REFERRING PHYSICIAN:  Bruce H. Swords, MD   INDICATION FOR STUDY:  Hypersomnia with sleep apnea.   EPWORTH SCORE:  13.   SLEEP ARCHITECTURE:  The patient had total sleep time of 280 minutes  with no significant slow wave sleep noted and significantly decreased  REM at 45 minutes.  Sleep onset latency was normal at 23 minutes, and  REM onset was actually normal at 62 minutes.  Sleep efficiency was 4 at  79%.   RESPIRATORY DATA:  The patient was found to have 32 apneas and 116  hypopneas, giving her an apnea-hypopnea index of 32 events per hour.  The events were not positional, but they were clearly worse during REM.  There was loud snoring noted throughout.  It should be noted that the  patient did not meet split night protocol secondary to the majority of  her events occurring well after midnight.   OXYGEN DATA:  The patient had O2 desaturation as low as 59% with her  obstructive events.   CARDIAC:  No clinically significant arrhythmias were seen.   MOVEMENT/PARASOMNIA:  The patient did not have any significant leg jerks  or abnormal behaviors noted.   IMPRESSION/RECOMMENDATIONS:  Moderate obstructive sleep apnea-hypopnea  syndrome with an apnea-hypopnea index of 32 events per hour and oxygen  desaturation as low as 59%.  The patient did not meet split night  protocol secondary to the majority of her events occurring well after  midnight.  Treatment for this degree of sleep apnea can include a trial  of weight loss alone if applicable, upper airway surgery, oral  appliance, and also continuous positive airway pressure.  Clinical  correlation is  suggested.      Barbaraann Share, MD,FCCP  Diplomate, American Board of Sleep  Medicine  Electronically Signed    [facility]  D:  04/28/2008 13:52:36  T:  04/29/2008 01:11:48  Job:  [id number]

## 2010-08-05 ENCOUNTER — Ambulatory Visit (HOSPITAL_BASED_OUTPATIENT_CLINIC_OR_DEPARTMENT_OTHER)
Admission: RE | Admit: 2010-08-05 | Discharge: 2010-08-05 | Disposition: A | Discharge status: Home / Self Care | Payer: BC Managed Care – PPO | Source: Ambulatory Visit | Attending: Internal Medicine | Admitting: Internal Medicine

## 2010-08-05 DIAGNOSIS — Z1231 Encounter for screening mammogram for malignant neoplasm of breast: Secondary | ICD-10-CM | POA: Insufficient documentation

## 2010-10-03 ENCOUNTER — Other Ambulatory Visit: Payer: Self-pay | Admitting: Internal Medicine

## 2010-11-06 ENCOUNTER — Other Ambulatory Visit: Payer: Self-pay | Admitting: Internal Medicine

## 2011-01-02 ENCOUNTER — Other Ambulatory Visit: Payer: Self-pay | Admitting: Internal Medicine

## 2011-04-03 ENCOUNTER — Other Ambulatory Visit: Payer: Self-pay | Admitting: Internal Medicine

## 2011-08-28 ENCOUNTER — Other Ambulatory Visit: Payer: Self-pay | Admitting: Obstetrics & Gynecology

## 2011-08-28 DIAGNOSIS — Z139 Encounter for screening, unspecified: Secondary | ICD-10-CM

## 2011-09-02 ENCOUNTER — Ambulatory Visit (HOSPITAL_BASED_OUTPATIENT_CLINIC_OR_DEPARTMENT_OTHER)
Admission: RE | Admit: 2011-09-02 | Discharge: 2011-09-02 | Disposition: A | Discharge status: Home / Self Care | Payer: BC Managed Care – PPO | Source: Ambulatory Visit | Attending: Obstetrics & Gynecology | Admitting: Obstetrics & Gynecology

## 2011-09-02 DIAGNOSIS — Z139 Encounter for screening, unspecified: Secondary | ICD-10-CM

## 2011-09-02 DIAGNOSIS — Z1231 Encounter for screening mammogram for malignant neoplasm of breast: Secondary | ICD-10-CM | POA: Insufficient documentation

## 2011-09-15 ENCOUNTER — Telehealth: Payer: Self-pay | Admitting: Internal Medicine

## 2011-09-15 NOTE — Telephone Encounter (Signed)
Pt calling regarding edema to both feet x 3 weeks, swelling is down when she gets up in the am. Afebrile. Dr. Cato Mulligan pt. Emergent s/s for Edema, Atraumatic r/o per protocol except for see in 24hrs due to edema newly worse, appt sch with Dr. Caryl Never 09/16/11 at 11:45 am. Call back parameters given.

## 2011-09-16 ENCOUNTER — Ambulatory Visit (INDEPENDENT_AMBULATORY_CARE_PROVIDER_SITE_OTHER): Payer: BC Managed Care – PPO | Admitting: Family Medicine

## 2011-09-16 ENCOUNTER — Encounter: Payer: Self-pay | Admitting: Family Medicine

## 2011-09-16 VITALS — BP 150/96 | Temp 99.0°F | Wt 200.0 lb

## 2011-09-16 DIAGNOSIS — R600 Localized edema: Secondary | ICD-10-CM

## 2011-09-16 DIAGNOSIS — G4733 Obstructive sleep apnea (adult) (pediatric): Secondary | ICD-10-CM

## 2011-09-16 DIAGNOSIS — I10 Essential (primary) hypertension: Secondary | ICD-10-CM

## 2011-09-16 DIAGNOSIS — R609 Edema, unspecified: Secondary | ICD-10-CM

## 2011-09-16 LAB — POCT URINALYSIS DIPSTICK
Bilirubin, UA: NEGATIVE
Ketones, UA: NEGATIVE
Leukocytes, UA: NEGATIVE
Nitrite, UA: NEGATIVE
Protein, UA: NEGATIVE
pH, UA: 7

## 2011-09-16 LAB — BASIC METABOLIC PANEL
BUN: 10 mg/dL (ref 6–23)
Calcium: 9.8 mg/dL (ref 8.4–10.5)
Creatinine, Ser: 0.7 mg/dL (ref 0.4–1.2)

## 2011-09-16 LAB — HEPATIC FUNCTION PANEL
Albumin: 3.8 g/dL (ref 3.5–5.2)
Total Bilirubin: 0.4 mg/dL (ref 0.3–1.2)

## 2011-09-16 LAB — TSH: TSH: 1.23 u[IU]/mL (ref 0.35–5.50)

## 2011-09-16 MED ORDER — HYDROCHLOROTHIAZIDE 25 MG PO TABS: 25.0000 mg | ORAL_TABLET | Freq: Every day | ORAL | Status: DC

## 2011-09-16 NOTE — Progress Notes (Signed)
  Subjective:    Patient ID: Yolanda [name], female    DOB: 04/10/1960, 51 y.o.   MRN: [id number]  HPI  Patient seen with bilateral leg and foot edema for 3 weeks. Similar symptoms in the past. Takes HCTZ 25 mg daily. Denies any dyspnea. No orthopnea or PND. No regular exercise. She has history of obstructive sleep apnea but does not use CPAP. She's had intolerance of various types of masks. No nonsteroidal use. Generally low-sodium diet. Previous venous Dopplers 2 years ago no evidence for DVT.  Patient has very strong family history of type 2 diabetes. Father and 2 brothers with type 2 diabetes. She has some thirst which has been present for several months. No weight loss. Occasional urine frequency.    Review of Systems  Constitutional: Positive for fatigue. Negative for fever, chills and appetite change.  Respiratory: Negative for cough and shortness of breath.   Cardiovascular: Positive for leg swelling. Negative for chest pain and palpitations.  Gastrointestinal: Negative for abdominal pain.  Genitourinary: Positive for frequency.  Neurological: Negative for dizziness.       Objective:   Physical Exam  Constitutional: She appears well-developed and well-nourished.  Neck: Neck supple. No thyromegaly present.  Cardiovascular: Normal rate and regular rhythm.   Pulmonary/Chest: Effort normal and breath sounds normal. No respiratory distress. She has no wheezes. She has no rales.  Musculoskeletal: She exhibits edema.       Patient has trace to 1+ edema feet ankles and lower legs bilaterally  Lymphadenopathy:    She has no cervical adenopathy.          Assessment & Plan:  Bilateral leg edema. Question venous stasis. Question related to obstructive sleep apnea currently untreated. Obtain screening labs. She's had previous mild low albumin of 3.2. If labs normal consider venous compression and elevation.  Hypertension. Currently suboptimal control on HCTZ. Work on weight loss.  Followup with primary 2 weeks and reassess then

## 2011-09-17 NOTE — Progress Notes (Signed)
Quick Note:  Pt informed on personally identified VM ______ 

## 2011-10-20 ENCOUNTER — Ambulatory Visit: Payer: BC Managed Care – PPO | Admitting: Internal Medicine

## 2012-07-28 ENCOUNTER — Other Ambulatory Visit: Payer: Self-pay | Admitting: Obstetrics & Gynecology

## 2012-07-28 DIAGNOSIS — Z1231 Encounter for screening mammogram for malignant neoplasm of breast: Secondary | ICD-10-CM

## 2012-08-01 ENCOUNTER — Encounter: Payer: Self-pay | Admitting: Obstetrics & Gynecology

## 2012-08-01 ENCOUNTER — Ambulatory Visit (INDEPENDENT_AMBULATORY_CARE_PROVIDER_SITE_OTHER): Payer: BC Managed Care – PPO | Admitting: Obstetrics & Gynecology

## 2012-08-01 VITALS — BP 130/86 | HR 78 | Resp 16 | Ht 63.0 in | Wt 178.2 lb

## 2012-08-01 DIAGNOSIS — E119 Type 2 diabetes mellitus without complications: Secondary | ICD-10-CM

## 2012-08-01 DIAGNOSIS — D219 Benign neoplasm of connective and other soft tissue, unspecified: Secondary | ICD-10-CM

## 2012-08-01 DIAGNOSIS — Z01419 Encounter for gynecological examination (general) (routine) without abnormal findings: Secondary | ICD-10-CM

## 2012-08-01 DIAGNOSIS — Z124 Encounter for screening for malignant neoplasm of cervix: Secondary | ICD-10-CM

## 2012-08-01 DIAGNOSIS — D259 Leiomyoma of uterus, unspecified: Secondary | ICD-10-CM

## 2012-08-01 LAB — POCT HEMOGLOBIN: Hemoglobin: 13.1 g/dL (ref 12.2–16.2)

## 2012-08-01 NOTE — Addendum Note (Signed)
Addended by: Jerene Bears on: 08/01/2012 04:22 PM   Modules accepted: Orders

## 2012-08-01 NOTE — Progress Notes (Signed)
52 y.o. G0P0 MarriedAfrican AmericanF here for annual exam.  No bleeding since November, 2013.  Having mild hot flashes and night sweats.  Sleep is okay.  Not really interested in being on hormones.  New diagnosis of diabetes--[facility].  Last HbA1C was 7.5.  Goal is 6.0.  Was 10 with initial diagnosis.  Down ~20lbs from 11/13 due to dietary changes.  Had eye exam 3/14.  Reports daily nausea with emesis in the morning, going on for months now.  Has been told not related to Glucophage.  Has not had a colonoscopy due to not wanting to be "put to sleep".    Patient's last menstrual period was 07/28/2010.          Sexually active: yes  The current method of family planning is post menopausal status.    Exercising: no  The patient does not participate in regular exercise at present. Smoker:  no  Health Maintenance: Pap:  2012 History of abnormal Pap:  no MMG:  08/2011 normal Colonoscopy: never done BMD:   none TDaP: 6/08 Dr. Cato Mulligan Screening Labs: declined , Hb today: declined, Urine today: declined Has these done at [facility]   reports that she has never smoked. She does not have any smokeless tobacco history on file. She reports that she drinks about 0.6 ounces of alcohol per week. She reports that she does not use illicit drugs.  Past Medical History  Diagnosis Date  . Diabetes mellitus without complication     type II  . Fibroid     Past Surgical History  Procedure Laterality Date  . Cholecystectomy  2011    Current Outpatient Prescriptions  Medication Sig Dispense Refill  . hydrochlorothiazide (HYDRODIURIL) 25 MG tablet Take 1 tablet (25 mg total) by mouth daily.  30 tablet  5  . metFORMIN (GLUCOPHAGE) 1000 MG tablet Take 1,000 mg by mouth 2 (two) times daily with a meal.      . BAYER CONTOUR NEXT TEST test strip        No current facility-administered medications for this visit.    Family History  Problem Relation Age of Onset  . Asthma Mother    . Diabetes Father   . Asthma Brother   . Asthma Brother   . Diabetes Brother   . Diabetes Brother   . Heart Problems Sister     ROS:  Pertinent items are noted in HPI.  Otherwise, a comprehensive ROS was negative.  Exam:   BP 130/86  Pulse 78  Resp 16  Ht 5\' 3"  (1.6 m)  Wt 178 lb 3.2 oz (80.831 kg)  BMI 31.57 kg/m2  LMP 07/28/2010  Weight change:-18lb 2012  Height: 5\' 3"  (160 cm)  Ht Readings from Last 3 Encounters:  08/01/12 5\' 3"  (1.6 m)  06/17/09 5\' 3"  (1.6 m)  07/04/08 5\' 3"  (1.6 m)    General appearance: alert, cooperative and appears stated age Head: Normocephalic, without obvious abnormality, atraumatic Neck: no adenopathy, supple, symmetrical, trachea midline and thyroid normal to inspection and palpation Lungs: clear to auscultation bilaterally Breasts: normal appearance, no masses or tenderness Heart: regular rate and rhythm Abdomen: soft, non-tender; bowel sounds normal; no masses,  no organomegaly Extremities: extremities normal, atraumatic, no cyanosis or edema Skin: Skin color, texture, turgor normal. No rashes or lesions Lymph nodes: Cervical, supraclavicular, and axillary nodes normal. No abnormal inguinal nodes palpated Neurologic: Grossly normal   Pelvic: External genitalia:  no lesions  Urethra:  normal appearing urethra with no masses, tenderness or lesions              Bartholins and Skenes: normal                 Vagina: normal appearing vagina with normal color and discharge, no lesions              Cervix: absent              Pap taken: yes Bimanual Exam:  Uterus:  about 10 weeks in size, decreased from prior visit              Adnexa: normal adnexa and no mass, fullness, tenderness               Rectovaginal: Confirms               Anus:  normal sphincter tone, no lesions  A:  Well Woman with normal exam H/O fibroids with u/s 11/12 with uterus measuring 16cm Diabetes, requests referral to new endocrinologist  P:   Mammogram  yearly.  D/w pt 3D.  She will consider.  Has appt already for July pap smear with HR HPV today. Release of records from [facility] [place] for records about diabetes Referral to Dr. Loreta Ave for screening colonoscopy return annually or prn  An After Visit Summary was printed and given to the patient.

## 2012-08-01 NOTE — Patient Instructions (Signed)

## 2012-08-01 NOTE — Addendum Note (Signed)
Addended by: Jerene Bears on: 08/01/2012 01:19 PM   Modules accepted: Orders

## 2012-08-02 ENCOUNTER — Other Ambulatory Visit: Payer: Self-pay | Admitting: Obstetrics & Gynecology

## 2012-08-02 DIAGNOSIS — Z1231 Encounter for screening mammogram for malignant neoplasm of breast: Secondary | ICD-10-CM

## 2012-08-03 ENCOUNTER — Telehealth: Payer: Self-pay | Admitting: Orthopedic Surgery

## 2012-08-03 NOTE — Telephone Encounter (Signed)
Spoke with pt about appt with Dr. Talmage Nap 09-22-12 at 9:30. Phone number and address given. Pt agreeable.

## 2012-08-03 NOTE — Telephone Encounter (Signed)
Spoke with pt about appt with Dr. Loreta Ave 08-18-12 at 2 pm. Phone number given. Pt agreeable.

## 2012-08-25 ENCOUNTER — Other Ambulatory Visit: Payer: Self-pay | Admitting: Gastroenterology

## 2012-08-25 DIAGNOSIS — R112 Nausea with vomiting, unspecified: Secondary | ICD-10-CM

## 2012-09-02 ENCOUNTER — Ambulatory Visit
Admission: RE | Admit: 2012-09-02 | Discharge: 2012-09-02 | Disposition: A | Discharge status: Home / Self Care | Payer: BC Managed Care – PPO | Source: Ambulatory Visit | Attending: Obstetrics & Gynecology | Admitting: Obstetrics & Gynecology

## 2012-09-02 ENCOUNTER — Ambulatory Visit (HOSPITAL_BASED_OUTPATIENT_CLINIC_OR_DEPARTMENT_OTHER): Payer: BC Managed Care – PPO

## 2012-09-02 DIAGNOSIS — Z1231 Encounter for screening mammogram for malignant neoplasm of breast: Secondary | ICD-10-CM

## 2012-09-06 ENCOUNTER — Encounter (HOSPITAL_COMMUNITY)
Admission: RE | Admit: 2012-09-06 | Discharge: 2012-09-06 | Disposition: A | Discharge status: Home / Self Care | Payer: BC Managed Care – PPO | Source: Ambulatory Visit | Attending: Gastroenterology | Admitting: Gastroenterology

## 2012-09-06 DIAGNOSIS — R112 Nausea with vomiting, unspecified: Secondary | ICD-10-CM

## 2012-09-06 MED ORDER — TECHNETIUM TC 99M SULFUR COLLOID
2.0000 | Freq: Once | INTRAVENOUS | Status: AC | PRN
  Administered 2012-09-06: 2 via ORAL

## 2012-11-02 LAB — HM COLONOSCOPY

## 2012-12-22 ENCOUNTER — Other Ambulatory Visit: Payer: Self-pay

## 2013-06-28 ENCOUNTER — Encounter: Payer: Self-pay | Admitting: Obstetrics & Gynecology

## 2013-07-25 ENCOUNTER — Other Ambulatory Visit: Payer: Self-pay

## 2013-07-25 DIAGNOSIS — Z1231 Encounter for screening mammogram for malignant neoplasm of breast: Secondary | ICD-10-CM

## 2013-08-08 ENCOUNTER — Encounter: Payer: Self-pay | Admitting: Obstetrics & Gynecology

## 2013-08-10 ENCOUNTER — Ambulatory Visit (INDEPENDENT_AMBULATORY_CARE_PROVIDER_SITE_OTHER): Payer: BC Managed Care – PPO | Admitting: Obstetrics & Gynecology

## 2013-08-10 ENCOUNTER — Encounter: Payer: Self-pay | Admitting: Obstetrics & Gynecology

## 2013-08-10 VITALS — BP 128/80 | HR 60 | Ht 63.0 in | Wt 180.0 lb

## 2013-08-10 DIAGNOSIS — M25559 Pain in unspecified hip: Secondary | ICD-10-CM

## 2013-08-10 DIAGNOSIS — M25552 Pain in left hip: Secondary | ICD-10-CM

## 2013-08-10 DIAGNOSIS — R6889 Other general symptoms and signs: Secondary | ICD-10-CM

## 2013-08-10 DIAGNOSIS — Z01419 Encounter for gynecological examination (general) (routine) without abnormal findings: Secondary | ICD-10-CM

## 2013-08-10 DIAGNOSIS — E2839 Other primary ovarian failure: Secondary | ICD-10-CM

## 2013-08-10 DIAGNOSIS — M79609 Pain in unspecified limb: Secondary | ICD-10-CM

## 2013-08-10 DIAGNOSIS — Z0000 Encounter for general adult medical examination without abnormal findings: Secondary | ICD-10-CM

## 2013-08-10 DIAGNOSIS — R899 Unspecified abnormal finding in specimens from other organs, systems and tissues: Secondary | ICD-10-CM

## 2013-08-10 DIAGNOSIS — M79643 Pain in unspecified hand: Secondary | ICD-10-CM

## 2013-08-10 LAB — COMPREHENSIVE METABOLIC PANEL
ALBUMIN: 4.3 g/dL (ref 3.5–5.2)
ALK PHOS: 106 U/L (ref 39–117)
ALT: 44 U/L — AB (ref 0–35)
AST: 27 U/L (ref 0–37)
BUN: 10 mg/dL (ref 6–23)
CO2: 30 mEq/L (ref 19–32)
Calcium: 9.8 mg/dL (ref 8.4–10.5)
Chloride: 99 mEq/L (ref 96–112)
Creat: 0.64 mg/dL (ref 0.50–1.10)
Glucose, Bld: 155 mg/dL — ABNORMAL HIGH (ref 70–99)
POTASSIUM: 4.2 meq/L (ref 3.5–5.3)
SODIUM: 140 meq/L (ref 135–145)
TOTAL PROTEIN: 7.4 g/dL (ref 6.0–8.3)
Total Bilirubin: 0.4 mg/dL (ref 0.2–1.2)

## 2013-08-10 LAB — LIPID PANEL
Cholesterol: 187 mg/dL (ref 0–200)
HDL: 71 mg/dL (ref >39)
LDL CALC: 95 mg/dL (ref 0–99)
Total CHOL/HDL Ratio: 2.6 Ratio
Triglycerides: 106 mg/dL (ref <150)
VLDL: 21 mg/dL (ref 0–40)

## 2013-08-10 LAB — POCT URINALYSIS DIPSTICK
GLUCOSE UA: NEGATIVE
Ketones, UA: NEGATIVE
NITRITE UA: NEGATIVE
RBC UA: NEGATIVE
Urobilinogen, UA: NEGATIVE
pH, UA: 6

## 2013-08-10 LAB — HEMOGLOBIN, FINGERSTICK: Hemoglobin, fingerstick: 13.4 g/dL (ref 12.0–16.0)

## 2013-08-10 NOTE — Progress Notes (Signed)
53 y.o. G0P0 MarriedAfrican AmericanF here for annual exam.  No vaginal bleeding in 1 1/2 years.  Sees endocrinologist yearly.  Having some vaginal dryness.  D/W pt lubrication and OTC products for dryness.  Not really interested in hormonal therapy at this time.    hbA1C about a month ago was 6.7.    Feels like she is haivng a lot more issues with her hands.  Joints feel "knotty/knobby" to pt.  Left hip is bothering her as well.  Would like to see specialist.  Patient's last menstrual period was 12/28/2011.          Sexually active: yes  The current method of family planning is none.    Exercising: no  The patient does not participate in regular exercise at present. Smoker:  no  Health Maintenance: Pap:  08/01/2012, WNL.  Neg HR HPV. History of abnormal Pap:  no MMG:  Per Pt scheduled for next mth Colonoscopy:  2014, polyps, Dr. Collene Mares, f/u 5 years BMD:  Pt wants to schedule   TDaP:  07/2006, Dr. Leanne Chang Screening Labs: today, Hb today: 13.4, Urine today: WBC-2+, PH-6, PROTEIN-trace, BILIRUBIN-2+   reports that she has never smoked. She has never used smokeless tobacco. She reports that she drinks alcohol. She reports that she does not use illicit drugs.  Past Medical History  Diagnosis Date  . Diabetes mellitus without complication     type II  . Fibroid uterus   . Refusal of blood transfusions as patient is Jehovah's Witness     NO BLOOD PRODUCTS    Past Surgical History  Procedure Laterality Date  . Cholecystectomy  1/11    Current Outpatient Prescriptions  Medication Sig Dispense Refill  . BAYER CONTOUR NEXT TEST test strip       . clindamycin (CLEOCIN) 150 MG capsule Take by mouth QID.      Marland Kitchen etodolac (LODINE) 400 MG tablet Take by mouth 3 (three) times daily.      . hydrochlorothiazide (HYDRODIURIL) 25 MG tablet Take 1 tablet (25 mg total) by mouth daily.  30 tablet  5  . metFORMIN (GLUCOPHAGE) 1000 MG tablet Take 1,000 mg by mouth 2 (two) times daily with a meal.        No current facility-administered medications for this visit.    Family History  Problem Relation Age of Onset  . Asthma Mother   . Diabetes Father   . Asthma Brother   . Asthma Brother   . Diabetes Brother   . Diabetes Brother   . Heart Problems Sister   . Hypertension Brother   . Hypertension Sister   . Heart attack Father   . Osteopenia Sister     ROS:  Pertinent items are noted in HPI.  Otherwise, a comprehensive ROS was negative.  Exam:   BP 128/80  Pulse 60  Ht 5\' 3"  (1.6 m)  Wt 180 lb (81.647 kg)  BMI 31.89 kg/m2  LMP 12/28/2011  Height: 5\' 3"  (160 cm)  Ht Readings from Last 3 Encounters:  08/10/13 5\' 3"  (1.6 m)  08/01/12 5\' 3"  (1.6 m)  06/17/09 5\' 3"  (1.6 m)    General appearance: alert, cooperative and appears stated age Head: Normocephalic, without obvious abnormality, atraumatic Neck: no adenopathy, supple, symmetrical, trachea midline and thyroid normal to inspection and palpation Lungs: clear to auscultation bilaterally Breasts: normal appearance, no masses or tenderness Heart: regular rate and rhythm Abdomen: soft, non-tender; bowel sounds normal; no masses,  no organomegaly Extremities: extremities normal,  atraumatic, no cyanosis or edema Skin: Skin color, texture, turgor normal. No rashes or lesions Lymph nodes: Cervical, supraclavicular, and axillary nodes normal. No abnormal inguinal nodes palpated Neurologic: Grossly normal   Pelvic: External genitalia:  no lesions              Urethra:  normal appearing urethra with no masses, tenderness or lesions              Bartholins and Skenes: normal                 Vagina: normal appearing vagina with normal color and discharge, no lesions              Cervix: no lesions              Pap taken: no Bimanual Exam:  Uterus:  enlarged, 8-10 weeks size, significantly decreased in size, mobile              Adnexa: normal adnexa and no mass, fullness, tenderness               Rectovaginal: Confirms                Anus:  normal sphincter tone, no lesions  A:  Well Woman with normal exam  H/O fibroids.  Much smaller on exam today Diabetes.  Sees endocrinologist yearly. H/O anemia.  Normal hb today. Increased joint pain in hands and left hip pain.  P: Mammogram yearly. D/w pt 3D.  Has appt already for July  pap smear with HR HPV 2014.  No pap today. CMP, TSH, Vit D,  Lipids Release of records from Dr. Collene Mares for screening colonoscopy  Referral to Dr. Lynann Bologna regarding joint pain. BMD ordered. return annually or prn  An After Visit Summary was printed and given to the patient.

## 2013-08-11 LAB — TSH: TSH: 2.406 u[IU]/mL (ref 0.350–4.500)

## 2013-08-11 LAB — VITAMIN D 25 HYDROXY (VIT D DEFICIENCY, FRACTURES): VIT D 25 HYDROXY: 23 ng/mL — AB (ref 30–89)

## 2013-08-16 NOTE — Addendum Note (Signed)
Addended by: Megan Salon on: 08/16/2013 06:19 AM   Modules accepted: Orders

## 2013-08-17 ENCOUNTER — Other Ambulatory Visit: Payer: Self-pay

## 2013-08-17 NOTE — Telephone Encounter (Signed)
Lmtcb//kn 

## 2013-08-17 NOTE — Telephone Encounter (Signed)
Message copied by Robley Fries on Thu Aug 17, 2013  2:22 PM ------      Message from: Megan Salon      Created: Wed Aug 16, 2013  6:17 AM       Inform pt glucose with labs was 155.  I didn't check a HbA1C as she reported it was <7 about a month ago.  One liver test was mildly elevated.  Has been elevated in the past as well as normal.  Should be watched and repeated.  Does she want to do that here or with PCP?  Cholesterol fine.  TSH normal.  Vit D 23.  Should take 50K Vit D weekly.  Repeat 3 months.  Could repeat liver and Vit D at same time if ok with her.  Orders placed. ------

## 2013-08-17 NOTE — Telephone Encounter (Signed)
Patient returning Kelly's call. °

## 2013-08-22 NOTE — Telephone Encounter (Signed)
Lmtcb//kn 

## 2013-08-25 ENCOUNTER — Other Ambulatory Visit: Payer: Self-pay | Admitting: Certified Nurse Midwife

## 2013-08-25 MED ORDER — CLOBETASOL PROPIONATE 0.05 % EX OINT: 1.0000 "application " | TOPICAL_OINTMENT | Freq: Two times a day (BID) | CUTANEOUS | Status: DC

## 2013-08-25 MED ORDER — VITAMIN D (ERGOCALCIFEROL) 1.25 MG (50000 UNIT) PO CAPS: 50000.0000 [IU] | ORAL_CAPSULE | ORAL | Status: DC

## 2013-08-25 NOTE — Telephone Encounter (Signed)
Sent clobetasol to pharmacy

## 2013-08-25 NOTE — Telephone Encounter (Signed)
Patient notified of all results. Will watch OTC medication/alcohol and has repeat lab work scheduled for 11/14/13. Rx for vitamin D sent to pharmacy-would you please sign off on this? Also, asking if we were the practice that has given her clobetasol ointment. I pulled her chart and it looks like we gave her Rx on 06/05/09. States she has a little itchy area and this seems to work well. I have not put this order in, if ok to refill, will you put this in? Please advise.

## 2013-09-05 ENCOUNTER — Ambulatory Visit
Admission: RE | Admit: 2013-09-05 | Discharge: 2013-09-05 | Disposition: A | Discharge status: Home / Self Care | Payer: BC Managed Care – PPO | Source: Ambulatory Visit

## 2013-09-05 ENCOUNTER — Ambulatory Visit
Admission: RE | Admit: 2013-09-05 | Discharge: 2013-09-05 | Disposition: A | Discharge status: Home / Self Care | Payer: BC Managed Care – PPO | Source: Ambulatory Visit | Attending: Obstetrics & Gynecology | Admitting: Obstetrics & Gynecology

## 2013-09-05 DIAGNOSIS — E2839 Other primary ovarian failure: Secondary | ICD-10-CM

## 2013-09-05 DIAGNOSIS — Z1231 Encounter for screening mammogram for malignant neoplasm of breast: Secondary | ICD-10-CM

## 2013-09-08 ENCOUNTER — Ambulatory Visit: Payer: BC Managed Care – PPO | Admitting: Obstetrics & Gynecology

## 2013-11-14 ENCOUNTER — Other Ambulatory Visit (INDEPENDENT_AMBULATORY_CARE_PROVIDER_SITE_OTHER): Payer: BC Managed Care – PPO

## 2013-11-14 ENCOUNTER — Other Ambulatory Visit: Payer: Self-pay | Admitting: Obstetrics & Gynecology

## 2013-11-14 DIAGNOSIS — R899 Unspecified abnormal finding in specimens from other organs, systems and tissues: Secondary | ICD-10-CM

## 2013-11-14 DIAGNOSIS — R6889 Other general symptoms and signs: Secondary | ICD-10-CM

## 2013-11-14 LAB — COMPREHENSIVE METABOLIC PANEL
ALK PHOS: 135 U/L — AB (ref 39–117)
ALT: 32 U/L (ref 0–35)
AST: 23 U/L (ref 0–37)
Albumin: 4.1 g/dL (ref 3.5–5.2)
BILIRUBIN TOTAL: 0.2 mg/dL (ref 0.2–1.2)
BUN: 16 mg/dL (ref 6–23)
CO2: 25 mEq/L (ref 19–32)
Calcium: 10.1 mg/dL (ref 8.4–10.5)
Chloride: 97 mEq/L (ref 96–112)
Creat: 0.64 mg/dL (ref 0.50–1.10)
Glucose, Bld: 144 mg/dL — ABNORMAL HIGH (ref 70–99)
Potassium: 4.4 mEq/L (ref 3.5–5.3)
SODIUM: 138 meq/L (ref 135–145)
TOTAL PROTEIN: 7.3 g/dL (ref 6.0–8.3)

## 2013-11-15 LAB — HEMOGLOBIN A1C
Hgb A1c MFr Bld: 7.2 % — ABNORMAL HIGH (ref <5.7)
Mean Plasma Glucose: 160 mg/dL — ABNORMAL HIGH (ref <117)

## 2013-11-15 LAB — VITAMIN D 25 HYDROXY (VIT D DEFICIENCY, FRACTURES): VIT D 25 HYDROXY: 22 ng/mL — AB (ref 30–89)

## 2013-11-17 ENCOUNTER — Telehealth: Payer: Self-pay

## 2013-11-17 ENCOUNTER — Other Ambulatory Visit: Payer: Self-pay | Admitting: Obstetrics and Gynecology

## 2013-11-17 MED ORDER — VITAMIN D (ERGOCALCIFEROL) 1.25 MG (50000 UNIT) PO CAPS
ORAL_CAPSULE | ORAL | Status: DC
Start: 2013-11-17 — End: 2014-09-07

## 2013-11-17 NOTE — Telephone Encounter (Signed)
Message copied by Jasmine Awe on Fri Nov 17, 2013 11:38 AM ------      Message from: [place], BROOK E      Created: Thu Nov 16, 2013  5:35 PM       Please contact patient regarding her elevated hemoglobin A1C.      This appears to have been previously elevated also.       I recommend patient see her PCP for diabetes care.      We can refer her to a PCP or endocrinology if she does not have one.            Her vitamin D is also low.      Has she been taking her Vit d 50,000 units weekly? ------

## 2013-11-17 NOTE — Telephone Encounter (Signed)
I recommend restarting the Vitamin D 50,000 units and take every one pill orally other week until next annual exam. Her vitamin D is still very low.  I will put in an order for this to her pharmacy.

## 2013-11-17 NOTE — Telephone Encounter (Signed)
Spoke with patient. Advised of message as seen below from Dr.Silva. Patient is agreeable and verbalizes understanding.  Routing to provider for final review. Patient agreeable to disposition. Will close encounter.  

## 2013-11-17 NOTE — Telephone Encounter (Signed)
Spoke with patient. Advised of message as seen below from [place]. Patient is agreeable and verbalizes understanding. Patient has an upcoming appointment with [facility]. Patient states that she has completed her rx for Vitamin D 50,000 units. "It was only prescribed to me for a few weeks." Advised patient would let Dr.Silva know and give her a call back with further recommendations. Patient is agreeable. Patient is currently using pharmacy on file.

## 2013-12-01 ENCOUNTER — Other Ambulatory Visit: Payer: Self-pay

## 2014-02-21 ENCOUNTER — Other Ambulatory Visit: Payer: Self-pay | Admitting: *Deleted

## 2014-02-21 ENCOUNTER — Encounter: Payer: Self-pay | Admitting: Obstetrics and Gynecology

## 2014-02-21 ENCOUNTER — Telehealth: Payer: Self-pay | Admitting: Obstetrics & Gynecology

## 2014-02-21 ENCOUNTER — Ambulatory Visit (INDEPENDENT_AMBULATORY_CARE_PROVIDER_SITE_OTHER): Payer: 59 | Admitting: Obstetrics and Gynecology

## 2014-02-21 VITALS — BP 134/82 | HR 80 | Ht 63.0 in | Wt 191.2 lb

## 2014-02-21 DIAGNOSIS — N950 Postmenopausal bleeding: Secondary | ICD-10-CM

## 2014-02-21 LAB — CBC
HEMATOCRIT: 41 % (ref 36.0–46.0)
HEMOGLOBIN: 13.2 g/dL (ref 12.0–15.0)
MCH: 28.8 pg (ref 26.0–34.0)
MCHC: 32.2 g/dL (ref 30.0–36.0)
MCV: 89.5 fL (ref 78.0–100.0)
MPV: 10.5 fL (ref 8.6–12.4)
PLATELETS: 376 10*3/uL (ref 150–400)
RBC: 4.58 MIL/uL (ref 3.87–5.11)
RDW: 15.1 % (ref 11.5–15.5)
WBC: 9.6 10*3/uL (ref 4.0–10.5)

## 2014-02-21 NOTE — Patient Instructions (Signed)
Endometrial Biopsy, Care After Refer to this sheet in the next few weeks. These instructions provide you with information on caring for yourself after your procedure. Your health care provider may also give you more specific instructions. Your treatment has been planned according to current medical practices, but problems sometimes occur. Call your health care provider if you have any problems or questions after your procedure. WHAT TO EXPECT AFTER THE PROCEDURE After your procedure, it is typical to have the following:  You may have mild cramping and a small amount of vaginal bleeding for a few days after the procedure. This is normal. HOME CARE INSTRUCTIONS  Only take over-the-counter or prescription medicine as directed by your health care provider.  Do not douche, use tampons, or have sexual intercourse until your health care provider approves.  Follow your health care provider's instructions regarding any activity restrictions, such as strenuous exercise or heavy lifting. SEEK MEDICAL CARE IF:  You have heavy bleeding or bleeding longer than 2 days after the procedure.  You have bad smelling drainage from your vagina.  You have a fever and chills.  Youhave severe lower stomach (abdominal) pain. SEEK IMMEDIATE MEDICAL CARE IF:  You have severe cramps in your stomach or back.  You pass large blood clots.  Your bleeding increases.  You become weak or lightheaded, or you pass out. Document Released: 11/23/2012 Document Reviewed: 11/23/2012 ExitCare Patient Information 2015 [employer]. This information is not intended to replace advice given to you by your health care provider. Make sure you discuss any questions you have with your health care provider.  

## 2014-02-21 NOTE — Telephone Encounter (Signed)
Spoke with patient. Advised that per benefit quote received, if she has PUS only, her oop expectation will only be a $20 copay. If she has SHGM, her oop expectation will be $322.62. Patient agreeable.

## 2014-02-21 NOTE — Progress Notes (Signed)
Patient ID: Yolanda [name], female   DOB: 29-Sep-1960, 54 y.o.   MRN: [id number] GYNECOLOGY VISIT  PCP:     Referring provider:   HPI: 54 y.o.   Married  [place] American  female   G0P0 with Patient's last menstrual period was 12/28/2011.   here for postmenopausal bleeding x2 days.  Slight cramps.  History of fibroids.  Last ultrasound 01/14/11 and showed 7 fibroids, larges was 59 mm and intramural with subserosal component.  EMS was 5.6 mm.  Ovaries showed right ovary with 10 mm smooth cyst.  Left ovary normal.  Endometrial biopsy in 2012 showing fragmented endometrium with glandular and stromal collapse. No hyperplasia or malignancy noted.    Never took HRT.  GYNECOLOGIC HISTORY: Patient's last menstrual period was 12/28/2011. Sexually active:  yes Partner preference: female Contraception: postmenopausal  Menopausal hormone therapy: no DES exposure:  no Blood transfusions: no  Sexually transmitted diseases:  no  GYN procedures and prior surgeries:  no Last mammogram: 09-05-13 heterogeneously dense/nl:[facility]                Last pap and high risk HPV testing:  08-01-12 wnl:neg HR HPV  History of abnormal pap smear:  no   OB History    Gravida Para Term Preterm AB TAB SAB Ectopic Multiple Living   0                LIFESTYLE: Exercise:    no           Tobacco:   no Alcohol:    occ Drug use:  no  Patient Active Problem List   Diagnosis Date Noted  . OBESITY 11/26/2009  . EDEMA- LOCALIZED 10/16/2009  . OBSTRUCTIVE SLEEP APNEA 06/01/2008  . HYPERTENSION 06/01/2008  . ALLERGIC RHINITIS 06/01/2008  . HEADACHE, CHRONIC 06/01/2008  . SLEEP APNEA 03/27/2008  . CONTACT DERMATITIS 10/14/2006    Past Medical History  Diagnosis Date  . Diabetes mellitus without complication     type II  . Fibroid uterus   . Refusal of blood transfusions as patient is Jehovah's Witness     NO BLOOD PRODUCTS    Past Surgical History  Procedure Laterality Date  . Cholecystectomy   1/11    Current Outpatient Prescriptions  Medication Sig Dispense Refill  . BAYER CONTOUR NEXT TEST test strip     . clobetasol ointment (TEMOVATE) 7.56 % Apply 1 application topically 2 (two) times daily. 30 g 0  . hydrochlorothiazide (HYDRODIURIL) 25 MG tablet Take 1 tablet (25 mg total) by mouth daily. 30 tablet 5  . metFORMIN (GLUCOPHAGE-XR) 500 MG 24 hr tablet   4  . Vitamin D, Ergocalciferol, (DRISDOL) 50000 UNITS CAPS capsule Take one capsule (50,000 units) by mouth every 14 days. (Patient not taking: Reported on 02/21/2014) 26 capsule 0   No current facility-administered medications for this visit.     ALLERGIES: Review of patient's allergies indicates no known allergies.  Family History  Problem Relation Age of Onset  . Asthma Mother   . Diabetes Father   . Asthma Brother   . Asthma Brother   . Diabetes Brother   . Diabetes Brother   . Heart Problems Sister   . Hypertension Brother   . Hypertension Sister   . Heart attack Father   . Osteopenia Sister     History   Social History  . Marital Status: Married    Spouse Name: N/A    Number of Children: N/A  . Years  of Education: N/A   Occupational History  . Not on file.   Social History Main Topics  . Smoking status: Never Smoker   . Smokeless tobacco: Never Used  . Alcohol Use: 0.0 oz/week    0 Not specified per week     Comment: occ  . Drug Use: No  . Sexual Activity:    Partners: Male    Birth Control/ Protection: Post-menopausal   Other Topics Concern  . Not on file   Social History Narrative    ROS:  Pertinent items are noted in HPI.  PHYSICAL EXAMINATION:    BP 134/82 mmHg  Pulse 80  Ht 5\' 3"  (1.6 m)  Wt 191 lb 3.2 oz (86.728 kg)  BMI 33.88 kg/m2  LMP 12/28/2011   Wt Readings from Last 3 Encounters:  02/21/14 191 lb 3.2 oz (86.728 kg)  08/10/13 180 lb (81.647 kg)  08/01/12 178 lb 3.2 oz (80.831 kg)     Ht Readings from Last 3 Encounters:  02/21/14 5\' 3"  (1.6 m)  08/10/13 5\' 3"  (1.6  m)  08/01/12 5\' 3"  (1.6 m)    General appearance: alert, cooperative and appears stated age    Pelvic: External genitalia:  no lesions              Urethra:  normal appearing urethra with no masses, tenderness or lesions              Bartholins and Skenes: normal                 Vagina: normal appearing vagina with normal color and discharge, no lesions.  Red menstrual like flow - moderate.               Cervix: normal appearance                 Bimanual Exam:  Uterus:  uterus is 8 week size , shape, consistency and nontender                                      Adnexa: normal adnexa in size, nontender and no masses                                    Procedure - endometrial biopsy Consent performed. Speculum place in vagina.  Sterile prep of cervix with Hibiclens.  Pipelle placed to     7     cm without difficulty twice. Tissue obtained and sent to pathology.  GPA rush specimen.  Speculum removed.  No complications.  ASSESSMENT  Postmenopausal bleeding. Bleeding is of moderate nature and does raise the question of menstruation.  History of fibroids.   PLAN  Follow up EMB.  Will check FSH and estradiol. Return for pelvic ultrasound tomorrow.  Sonohysterogram if indicated and desired by Dr. Sabra Heck.   An After Visit Summary was printed and given to the patient.  25 minutes face to face time of which over 50% was spent in counseling.

## 2014-02-21 NOTE — Telephone Encounter (Signed)
Routing to provider for final review. Patient agreeable to disposition. Will close encounter.     

## 2014-02-21 NOTE — Telephone Encounter (Signed)
Patient hasn't had a cycle in three years, has started bleeding. Last seen 08/11/13.

## 2014-02-21 NOTE — Telephone Encounter (Signed)
Spoke with patient. Post menopausal. Hx of fibroids.  States that yesterday, began bleeding and describes as heavy bleeding. Changing regular pad q 2 hours. Has intermittent diffuse abdominal pain, describes as "minor." Patient thinks pain could be from fibroid.   Advised patient office visit recommended. Can come today at 1100 with Dr. Quincy Simmonds. Scheduled.  Patient agreeable.  Routing to provider for final review. Patient agreeable to disposition. Will close encounter

## 2014-02-22 ENCOUNTER — Other Ambulatory Visit: Payer: Self-pay | Admitting: Obstetrics & Gynecology

## 2014-02-22 ENCOUNTER — Ambulatory Visit (INDEPENDENT_AMBULATORY_CARE_PROVIDER_SITE_OTHER): Payer: 59 | Admitting: Obstetrics & Gynecology

## 2014-02-22 ENCOUNTER — Ambulatory Visit (INDEPENDENT_AMBULATORY_CARE_PROVIDER_SITE_OTHER): Payer: 59

## 2014-02-22 VITALS — BP 136/78

## 2014-02-22 DIAGNOSIS — N950 Postmenopausal bleeding: Secondary | ICD-10-CM

## 2014-02-22 DIAGNOSIS — D251 Intramural leiomyoma of uterus: Secondary | ICD-10-CM

## 2014-02-22 LAB — ESTRADIOL: ESTRADIOL: 25.7 pg/mL

## 2014-02-22 LAB — FOLLICLE STIMULATING HORMONE: FSH: 31 m[IU]/mL

## 2014-02-22 NOTE — Progress Notes (Signed)
54 y.o. Yolanda [name] here for a pelvic ultrasound with sonohystogram due to PMP bleeding.  FSh 48 yesterday and endometrial biopsy done by Dr. Quincy Simmonds showed secretory endometrium and an endometrial polyp but no evidence of cancer.  Hemoglobin was 13.2   Patient's last menstrual period was 12/28/2011.  Contraception:  None  Technique:  Both transabdominal and transvaginal ultrasound examinations of the pelvis were performed. Transabdominal technique was performed for global imaging of the pelvis including uterus, ovaries, adnexal regions, and pelvic cul-de-sac.  It was necessary to proceed with endovaginal exam following the abdominal ultrasound transabdominal exam to visualize the endometrium and adnexa.  Color and duplex Doppler ultrasound was utilized to evaluate blood flow to the ovaries.   FINDINGS: Uterus: 9.0 x 5.7 x 5.1cm iwht multiple fibroids.  largst are 4.4 and 4.cm at anterior and posterior sides of uterus Endometrium: 6.77mm Adnexa:  Left: 2.6 x 2.4 x 1.9cm     Right:  3.1 x 1.1 x 1.3cm Cul de sac: no free fluid  SHSG:  After obtaining appropriate verbal consent from patient, the cervix was visualized using a speculum, and prepped with betadine.  A tenaculum was applied to the cervix.  Dilation of the cervix was not necessary. The catheter was passed into the uterus and sterile saline introduced, with the following findings: endocervical polyp 20 x 18mm and two probable polyps at fundus that are difficult to clearly delineate.  Images reviewed with pt as well as spouse.  Given findings of polyps on ultrasound and on endometrial biopsy, recommend proceeding with hysteroscopy.  Procedure discussed with patient.  Recovery and pain management discussed.  Risks discussed including but not limited to bleeding, rare risk of transfusion, infection, 1% risk of uterine perforation with risks of fluid deficit causing cardiac arrythmia, cerebral swelling and/or need to stop procedure early.   Fluid emboli and rare risk of death discussed.  DVT/PE, rare risk of risk of bowel/bladder/ureteral/vascular injury.  Patient aware if pathology abnormal she may need additional treatment.  All questions answered.    Assessment:  PMP bleeding and endometrial polyps  Plan:  Plan hysteroscopy with polyp resection, D&C.  Pt will call and let me know when desires to schedule.  ~25 minutes spent with patient >50% of time was in face to face discussion of above.

## 2014-02-23 ENCOUNTER — Encounter: Payer: Self-pay | Admitting: Obstetrics & Gynecology

## 2014-02-23 ENCOUNTER — Telehealth: Payer: Self-pay | Admitting: Obstetrics & Gynecology

## 2014-02-23 DIAGNOSIS — R748 Abnormal levels of other serum enzymes: Secondary | ICD-10-CM | POA: Insufficient documentation

## 2014-02-23 DIAGNOSIS — E669 Obesity, unspecified: Secondary | ICD-10-CM | POA: Insufficient documentation

## 2014-02-23 DIAGNOSIS — D259 Leiomyoma of uterus, unspecified: Secondary | ICD-10-CM | POA: Insufficient documentation

## 2014-02-23 DIAGNOSIS — E119 Type 2 diabetes mellitus without complications: Secondary | ICD-10-CM | POA: Insufficient documentation

## 2014-02-23 DIAGNOSIS — E1169 Type 2 diabetes mellitus with other specified complication: Secondary | ICD-10-CM | POA: Insufficient documentation

## 2014-02-23 NOTE — Telephone Encounter (Signed)
Patient calling stating she will need to "postpone" her surgery. She want to know what the next available Monday is for surgery.

## 2014-02-23 NOTE — Telephone Encounter (Signed)
Return call to patient. Requesting to reschedule surgery. Needs time to discuss with her employer. Date options discussed and surgery rescheduled to 03/13/2014. Surgical instruction sheet reviewed and printed copy mailed to patient.  Spoke to [place] in central scheduling to change date of case.  Routing to provider for final review. Patient agreeable to disposition. Will close encounter

## 2014-02-26 ENCOUNTER — Telehealth: Payer: Self-pay | Admitting: Obstetrics & Gynecology

## 2014-02-26 NOTE — Telephone Encounter (Signed)
Spoke with patient. Advised that per benefit quote received, she will be responsible to pay $291.35 for the surgeons portion of her surgery. Payment is due immediately since surgery is scheduled 01.26.2016. Payment is normally due 3 weeks prior to scheduled surgery date. Patient agreeable. Will mail check today.

## 2014-02-26 NOTE — Telephone Encounter (Signed)
Routing to provider for final review. Patient agreeable to disposition. Will close encounter.     

## 2014-03-05 ENCOUNTER — Other Ambulatory Visit: Payer: Self-pay | Admitting: Obstetrics & Gynecology

## 2014-03-05 NOTE — Patient Instructions (Addendum)
Your procedure is scheduled on:  Tuesday, March 13, 2014  Enter through the Main Entrance of [facility] at:  6:00 am  Pick up the phone at the desk and dial [phone].  Call this number if you have problems the morning of surgery: [phone].  Remember: Do NOT eat food:  After midnight Monday Do NOT drink clear liquids after:  After midnight Monday Take these medicines the morning of surgery with a SIP OF WATER:  Hydrochlorothiazide *Do not take Metformin 24 hours prior to surgery.  Do NOT wear jewelry (body piercing), metal hair clips/bobby pins, make-up, or nail polish. Do NOT wear lotions, powders, or perfumes.  You may wear deoderant. Do NOT shave for 48 hours prior to surgery. Do NOT bring valuables to the hospital. Contacts, dentures, or bridgework may not be worn into surgery. Have a responsible adult drive you home and stay with you for 24 hours after your procedure

## 2014-03-06 ENCOUNTER — Other Ambulatory Visit: Payer: Self-pay

## 2014-03-06 ENCOUNTER — Encounter (HOSPITAL_COMMUNITY)
Admission: RE | Admit: 2014-03-06 | Discharge: 2014-03-06 | Disposition: A | Discharge status: Home / Self Care | Payer: 59 | Source: Ambulatory Visit | Attending: Obstetrics & Gynecology | Admitting: Obstetrics & Gynecology

## 2014-03-06 ENCOUNTER — Encounter (HOSPITAL_COMMUNITY): Payer: Self-pay

## 2014-03-06 DIAGNOSIS — D259 Leiomyoma of uterus, unspecified: Secondary | ICD-10-CM | POA: Insufficient documentation

## 2014-03-06 DIAGNOSIS — N840 Polyp of corpus uteri: Secondary | ICD-10-CM | POA: Diagnosis not present

## 2014-03-06 DIAGNOSIS — N950 Postmenopausal bleeding: Secondary | ICD-10-CM | POA: Diagnosis not present

## 2014-03-06 DIAGNOSIS — Z01818 Encounter for other preprocedural examination: Secondary | ICD-10-CM | POA: Diagnosis present

## 2014-03-06 HISTORY — DX: Essential (primary) hypertension: I10

## 2014-03-06 HISTORY — DX: Unspecified asthma, uncomplicated: J45.909

## 2014-03-06 HISTORY — DX: Other specified postprocedural states: R11.2

## 2014-03-06 HISTORY — DX: Sleep apnea, unspecified: G47.30

## 2014-03-06 HISTORY — DX: Nausea with vomiting, unspecified: R11.2

## 2014-03-06 HISTORY — DX: Other specified postprocedural states: Z98.890

## 2014-03-06 LAB — CBC
HCT: 41.3 % (ref 36.0–46.0)
HEMOGLOBIN: 12.9 g/dL (ref 12.0–15.0)
MCH: 29.1 pg (ref 26.0–34.0)
MCHC: 31.2 g/dL (ref 30.0–36.0)
MCV: 93.2 fL (ref 78.0–100.0)
Platelets: 308 10*3/uL (ref 150–400)
RBC: 4.43 MIL/uL (ref 3.87–5.11)
RDW: 14.4 % (ref 11.5–15.5)
WBC: 8.4 10*3/uL (ref 4.0–10.5)

## 2014-03-06 LAB — BASIC METABOLIC PANEL
Anion gap: 7 (ref 5–15)
BUN: 16 mg/dL (ref 6–23)
CALCIUM: 9.5 mg/dL (ref 8.4–10.5)
CO2: 32 mmol/L (ref 19–32)
CREATININE: 0.56 mg/dL (ref 0.50–1.10)
Chloride: 100 mEq/L (ref 96–112)
GFR calc Af Amer: >90 mL/min (ref >90)
GLUCOSE: 156 mg/dL — AB (ref 70–99)
Potassium: 4 mmol/L (ref 3.5–5.1)
SODIUM: 139 mmol/L (ref 135–145)

## 2014-03-12 ENCOUNTER — Encounter (HOSPITAL_COMMUNITY): Payer: Self-pay | Admitting: Anesthesiology

## 2014-03-12 NOTE — Anesthesia Preprocedure Evaluation (Addendum)
Anesthesia Evaluation  Patient identified by MRN, date of birth, ID band Patient awake    Reviewed: Allergy & Precautions, NPO status , Patient's Chart, lab work & pertinent test results  History of Anesthesia Complications (+) PONV and history of anesthetic complications  Airway Mallampati: I  TM Distance: >3 FB Neck ROM: Full    Dental no notable dental hx. (+) Teeth Intact   Pulmonary asthma , sleep apnea ,  breath sounds clear to auscultation  Pulmonary exam normal       Cardiovascular hypertension, Pt. on medications Rhythm:Regular Rate:Normal     Neuro/Psych  Headaches, negative psych ROS   GI/Hepatic negative GI ROS, Neg liver ROS,   Endo/Other  diabetes, Well Controlled, Type 2, Oral Hypoglycemic AgentsObesity  Renal/GU negative Renal ROS  negative genitourinary   Musculoskeletal negative musculoskeletal ROS (+)   Abdominal (+) + obese,   Peds  Hematology negative hematology ROS (+)   Anesthesia Other Findings Hypertrophied pharyngeal tonsils  Reproductive/Obstetrics (+) Pregnancy                            Anesthesia Physical Anesthesia Plan  ASA: III  Anesthesia Plan: General   Post-op Pain Management:    Induction: Intravenous  Airway Management Planned: LMA  Additional Equipment:   Intra-op Plan:   Post-operative Plan: Extubation in OR  Informed Consent: I have reviewed the patients History and Physical, chart, labs and discussed the procedure including the risks, benefits and alternatives for the proposed anesthesia with the patient or authorized representative who has indicated his/her understanding and acceptance.   Dental advisory given  Plan Discussed with: Anesthesiologist, CRNA and Surgeon  Anesthesia Plan Comments:         Anesthesia Quick Evaluation

## 2014-03-13 ENCOUNTER — Encounter (HOSPITAL_COMMUNITY)
Admission: RE | Disposition: A | Discharge status: Home / Self Care | Payer: Self-pay | Source: Ambulatory Visit | Attending: Obstetrics & Gynecology

## 2014-03-13 ENCOUNTER — Ambulatory Visit (HOSPITAL_COMMUNITY): Payer: 59 | Admitting: Anesthesiology

## 2014-03-13 ENCOUNTER — Ambulatory Visit (HOSPITAL_COMMUNITY)
Admission: RE | Admit: 2014-03-13 | Discharge: 2014-03-13 | Disposition: A | Discharge status: Home / Self Care | Payer: 59 | Source: Ambulatory Visit | Attending: Obstetrics & Gynecology | Admitting: Obstetrics & Gynecology

## 2014-03-13 ENCOUNTER — Encounter (HOSPITAL_COMMUNITY): Payer: Self-pay | Admitting: *Deleted

## 2014-03-13 DIAGNOSIS — G4730 Sleep apnea, unspecified: Secondary | ICD-10-CM | POA: Insufficient documentation

## 2014-03-13 DIAGNOSIS — N950 Postmenopausal bleeding: Secondary | ICD-10-CM

## 2014-03-13 DIAGNOSIS — E119 Type 2 diabetes mellitus without complications: Secondary | ICD-10-CM | POA: Diagnosis not present

## 2014-03-13 DIAGNOSIS — Z833 Family history of diabetes mellitus: Secondary | ICD-10-CM | POA: Insufficient documentation

## 2014-03-13 DIAGNOSIS — D259 Leiomyoma of uterus, unspecified: Secondary | ICD-10-CM | POA: Insufficient documentation

## 2014-03-13 DIAGNOSIS — E669 Obesity, unspecified: Secondary | ICD-10-CM | POA: Diagnosis not present

## 2014-03-13 DIAGNOSIS — N840 Polyp of corpus uteri: Secondary | ICD-10-CM

## 2014-03-13 DIAGNOSIS — Z79899 Other long term (current) drug therapy: Secondary | ICD-10-CM | POA: Diagnosis not present

## 2014-03-13 DIAGNOSIS — J45909 Unspecified asthma, uncomplicated: Secondary | ICD-10-CM | POA: Diagnosis not present

## 2014-03-13 DIAGNOSIS — I10 Essential (primary) hypertension: Secondary | ICD-10-CM | POA: Diagnosis not present

## 2014-03-13 DIAGNOSIS — Z8249 Family history of ischemic heart disease and other diseases of the circulatory system: Secondary | ICD-10-CM | POA: Insufficient documentation

## 2014-03-13 DIAGNOSIS — Z6833 Body mass index (BMI) 33.0-33.9, adult: Secondary | ICD-10-CM | POA: Diagnosis not present

## 2014-03-13 DIAGNOSIS — N924 Excessive bleeding in the premenopausal period: Secondary | ICD-10-CM | POA: Insufficient documentation

## 2014-03-13 HISTORY — PX: DILATATION & CURRETTAGE/HYSTEROSCOPY WITH RESECTOCOPE: SHX5572

## 2014-03-13 LAB — PREGNANCY, URINE: PREG TEST UR: NEGATIVE

## 2014-03-13 LAB — GLUCOSE, CAPILLARY
GLUCOSE-CAPILLARY: 128 mg/dL — AB (ref 70–99)
Glucose-Capillary: 140 mg/dL — ABNORMAL HIGH (ref 70–99)

## 2014-03-13 SURGERY — DILATATION & CURETTAGE/HYSTEROSCOPY WITH RESECTOCOPE
Anesthesia: General | Site: Vagina

## 2014-03-13 MED ORDER — ONDANSETRON HCL 4 MG/2ML IJ SOLN
INTRAMUSCULAR | Status: AC
  Filled 2014-03-13: qty 2

## 2014-03-13 MED ORDER — OXYCODONE HCL 5 MG/5ML PO SOLN: 5.0000 mg | Freq: Once | ORAL | Status: DC | PRN

## 2014-03-13 MED ORDER — LIDOCAINE HCL (CARDIAC) 20 MG/ML IV SOLN
INTRAVENOUS | Status: AC
  Filled 2014-03-13: qty 5

## 2014-03-13 MED ORDER — DEXAMETHASONE SODIUM PHOSPHATE 10 MG/ML IJ SOLN: INTRAMUSCULAR | Status: DC | PRN

## 2014-03-13 MED ORDER — DEXAMETHASONE SODIUM PHOSPHATE 10 MG/ML IJ SOLN
INTRAMUSCULAR | Status: DC | PRN
  Administered 2014-03-13: 4 mg via INTRAVENOUS

## 2014-03-13 MED ORDER — SCOPOLAMINE 1 MG/3DAYS TD PT72
MEDICATED_PATCH | TRANSDERMAL | Status: AC
  Administered 2014-03-13: 1.5 mg via TRANSDERMAL
  Filled 2014-03-13: qty 1

## 2014-03-13 MED ORDER — METOCLOPRAMIDE HCL 5 MG/ML IJ SOLN: 10.0000 mg | Freq: Once | INTRAMUSCULAR | Status: DC | PRN

## 2014-03-13 MED ORDER — OXYCODONE HCL 5 MG PO TABS: 5.0000 mg | ORAL_TABLET | Freq: Once | ORAL | Status: DC | PRN

## 2014-03-13 MED ORDER — DEXAMETHASONE SODIUM PHOSPHATE 4 MG/ML IJ SOLN
INTRAMUSCULAR | Status: AC
  Filled 2014-03-13: qty 1

## 2014-03-13 MED ORDER — MIDAZOLAM HCL 2 MG/2ML IJ SOLN
INTRAMUSCULAR | Status: DC | PRN
  Administered 2014-03-13: 2 mg via INTRAVENOUS

## 2014-03-13 MED ORDER — MEPERIDINE HCL 25 MG/ML IJ SOLN: 6.2500 mg | INTRAMUSCULAR | Status: DC | PRN

## 2014-03-13 MED ORDER — ONDANSETRON HCL 4 MG/2ML IJ SOLN
INTRAMUSCULAR | Status: DC | PRN
  Administered 2014-03-13: 4 mg via INTRAVENOUS

## 2014-03-13 MED ORDER — ONDANSETRON HCL 4 MG/2ML IJ SOLN: INTRAMUSCULAR | Status: DC | PRN

## 2014-03-13 MED ORDER — LACTATED RINGERS IV SOLN
INTRAVENOUS | Status: DC
  Administered 2014-03-13 (×2): via INTRAVENOUS

## 2014-03-13 MED ORDER — LIDOCAINE-EPINEPHRINE 1 %-1:100000 IJ SOLN
INTRAMUSCULAR | Status: AC
  Filled 2014-03-13: qty 1

## 2014-03-13 MED ORDER — PROPOFOL 10 MG/ML IV BOLUS
INTRAVENOUS | Status: AC
  Filled 2014-03-13: qty 40

## 2014-03-13 MED ORDER — CEFAZOLIN SODIUM-DEXTROSE 2-3 GM-% IV SOLR
2.0000 g | INTRAVENOUS | Status: AC
  Administered 2014-03-13: 2 g via INTRAVENOUS

## 2014-03-13 MED ORDER — MIDAZOLAM HCL 2 MG/2ML IJ SOLN
INTRAMUSCULAR | Status: DC | PRN
Start: 2014-03-13 — End: 2014-03-13

## 2014-03-13 MED ORDER — LIDOCAINE HCL (CARDIAC) 20 MG/ML IV SOLN
INTRAVENOUS | Status: DC | PRN
  Administered 2014-03-13: 50 mg via INTRAVENOUS

## 2014-03-13 MED ORDER — PROPOFOL 10 MG/ML IV BOLUS
INTRAVENOUS | Status: DC | PRN
  Administered 2014-03-13: 200 mg via INTRAVENOUS

## 2014-03-13 MED ORDER — FENTANYL CITRATE 0.05 MG/ML IJ SOLN
INTRAMUSCULAR | Status: DC | PRN
  Administered 2014-03-13: 50 ug via INTRAVENOUS

## 2014-03-13 MED ORDER — MIDAZOLAM HCL 2 MG/2ML IJ SOLN
INTRAMUSCULAR | Status: AC
  Filled 2014-03-13: qty 2

## 2014-03-13 MED ORDER — OXYCODONE HCL 5 MG PO TABS: 5.0000 mg | ORAL_TABLET | ORAL | Status: DC | PRN

## 2014-03-13 MED ORDER — FENTANYL CITRATE 0.05 MG/ML IJ SOLN: INTRAMUSCULAR | Status: DC | PRN

## 2014-03-13 MED ORDER — GLYCINE 1.5 % IR SOLN
Status: DC | PRN
  Administered 2014-03-13: 3000 mL

## 2014-03-13 MED ORDER — SCOPOLAMINE 1 MG/3DAYS TD PT72
1.0000 | MEDICATED_PATCH | Freq: Once | TRANSDERMAL | Status: DC
  Administered 2014-03-13: 1.5 mg via TRANSDERMAL

## 2014-03-13 MED ORDER — CEFAZOLIN SODIUM-DEXTROSE 2-3 GM-% IV SOLR
INTRAVENOUS | Status: AC
  Filled 2014-03-13: qty 50

## 2014-03-13 MED ORDER — HYDROCODONE-ACETAMINOPHEN 5-325 MG PO TABS: 1.0000 | ORAL_TABLET | Freq: Four times a day (QID) | ORAL | Status: DC | PRN

## 2014-03-13 MED ORDER — PHENYLEPHRINE 40 MCG/ML (10ML) SYRINGE FOR IV PUSH (FOR BLOOD PRESSURE SUPPORT)
PREFILLED_SYRINGE | INTRAVENOUS | Status: AC
  Filled 2014-03-13: qty 30

## 2014-03-13 MED ORDER — LIDOCAINE-EPINEPHRINE 1 %-1:100000 IJ SOLN
INTRAMUSCULAR | Status: DC | PRN
Start: 2014-03-13 — End: 2014-03-13
  Administered 2014-03-13: 10 mL

## 2014-03-13 MED ORDER — FENTANYL CITRATE 0.05 MG/ML IJ SOLN: 25.0000 ug | INTRAMUSCULAR | Status: DC | PRN

## 2014-03-13 MED ORDER — KETOROLAC TROMETHAMINE 30 MG/ML IJ SOLN
INTRAMUSCULAR | Status: DC | PRN
Start: 2014-03-13 — End: 2014-03-13
  Administered 2014-03-13: 30 mg via INTRAVENOUS

## 2014-03-13 MED ORDER — FENTANYL CITRATE 0.05 MG/ML IJ SOLN
INTRAMUSCULAR | Status: AC
  Filled 2014-03-13: qty 2

## 2014-03-13 MED ORDER — PHENYLEPHRINE HCL 10 MG/ML IJ SOLN
INTRAMUSCULAR | Status: DC | PRN
  Administered 2014-03-13: 80 ug via INTRAVENOUS
  Administered 2014-03-13 (×2): 40 ug via INTRAVENOUS
  Administered 2014-03-13 (×4): 80 ug via INTRAVENOUS

## 2014-03-13 SURGICAL SUPPLY — 18 items
CANISTER SUCT 3000ML (MISCELLANEOUS) ×2 IMPLANT
CATH ROBINSON RED A/P 16FR (CATHETERS) ×2 IMPLANT
CLOTH BEACON ORANGE TIMEOUT ST (SAFETY) ×2 IMPLANT
CONTAINER PREFILL 10% NBF 60ML (FORM) ×4 IMPLANT
DILATOR CANAL MILEX (MISCELLANEOUS) IMPLANT
ELECT REM PT RETURN 9FT ADLT (ELECTROSURGICAL) ×2
ELECTRODE REM PT RTRN 9FT ADLT (ELECTROSURGICAL) IMPLANT
GLOVE BIOGEL PI IND STRL 7.0 (GLOVE) ×1 IMPLANT
GLOVE BIOGEL PI INDICATOR 7.0 (GLOVE) ×1
GLOVE ECLIPSE 6.5 STRL STRAW (GLOVE) ×4 IMPLANT
GOWN STRL REUS W/TWL LRG LVL3 (GOWN DISPOSABLE) ×4 IMPLANT
LOOP ANGLED CUTTING 22FR (CUTTING LOOP) IMPLANT
PACK VAGINAL MINOR WOMEN LF (CUSTOM PROCEDURE TRAY) ×2 IMPLANT
PAD OB MATERNITY 4.3X12.25 (PERSONAL CARE ITEMS) ×2 IMPLANT
TOWEL OR 17X24 6PK STRL BLUE (TOWEL DISPOSABLE) ×4 IMPLANT
TUBING AQUILEX INFLOW (TUBING) ×2 IMPLANT
TUBING AQUILEX OUTFLOW (TUBING) ×2 IMPLANT
WATER STERILE IRR 1000ML POUR (IV SOLUTION) ×2 IMPLANT

## 2014-03-13 NOTE — H&P (Signed)
Yolanda [name] is an 54 y.o. female G0 MWD here for hysteroscopy, polyp resection, D&C due to PMP bleeding.  Pt did have an in-office endometrial biopsy that was negative for abnormality but polyp tissue was noted.  On SHGM, she had several polyps.  Pt does have known fibroids.    Pt has been counseled about risks and benfits on 02/22/14 and is documented in office note.  Pt aware that she could follow conservatively and not proceed as well as try cyclic progesterone.  She has chosen to proceed with surgical procedure today.  All questions.  Pertinent Gynecological History: Menses: post-menopausal Bleeding: dysfunctional uterine bleeding Contraception: post menopausal status DES exposure: denies Blood transfusions: none Sexually transmitted diseases: no past history Previous GYN Procedures: none  Last mammogram: normal Date: 7/15 Last pap: normal Date: 6/14 OB History: G0, P0   Menstrual History: Patient's last menstrual period was 12/28/2011.    Past Medical History  Diagnosis Date  . Diabetes mellitus without complication     type II  . Fibroid uterus   . Refusal of blood transfusions as patient is Jehov[employer]'s Witness     NO BLOOD PRODUCTS  . Hypertension   . Sleep apnea   . Asthma     with respiratory infection  . PONV (postoperative nausea and vomiting)     pt stopped breathing during colonoscopy    Past Surgical History  Procedure Laterality Date  . Cholecystectomy  1/11  . Colonoscopy      Family History  Problem Relation Age of Onset  . Asthma Mother   . Diabetes Father   . Asthma Brother   . Asthma Brother   . Diabetes Brother   . Diabetes Brother   . Heart Problems Sister   . Hypertension Brother   . Hypertension Sister   . Heart attack Father   . Osteopenia Sister     Social History:  reports that she has never smoked. She has never used smokeless tobacco. She reports that she drinks alcohol. She reports that she does not use illicit  drugs.  Allergies: No Known Allergies  Prescriptions prior to admission  Medication Sig Dispense Refill Last Dose  . Cholecalciferol (VITAMIN D PO) Take 1 capsule by mouth daily.     . hydrochlorothiazide (HYDRODIURIL) 25 MG tablet Take 1 tablet (25 mg total) by mouth daily. 30 tablet 5 03/12/2014 at 0900  . metFORMIN (GLUCOPHAGE-XR) 500 MG 24 hr tablet Take 1,000 mg by mouth 2 (two) times daily.   03/12/2014 at 0900  . BAYER CONTOUR NEXT TEST test strip    Taking  . clobetasol ointment (TEMOVATE) 2.35 % Apply 1 application topically 2 (two) times daily. (Patient not taking: Reported on 02/23/2014) 30 g 0 Taking  . Vitamin D, Ergocalciferol, (DRISDOL) 50000 UNITS CAPS capsule Take one capsule (50,000 units) by mouth every 14 days. (Patient not taking: Reported on 02/21/2014) 26 capsule 0 Not Taking    Review of Systems  All other systems reviewed and are negative.   Blood pressure 146/93, resp. rate 20, last menstrual period 12/28/2011, SpO2 92 %. Physical Exam  Constitutional: She is oriented to person, place, and time. She appears well-developed and well-nourished.  Cardiovascular: Normal rate and regular rhythm.   Respiratory: Effort normal and breath sounds normal.  GI: Soft. Bowel sounds are normal.  Neurological: She is alert and oriented to person, place, and time.  Skin: Skin is warm and dry.  Psychiatric: She has a normal mood and affect.  Results for orders placed or performed during the hospital encounter of 03/13/14 (from the past 24 hour(s))  Pregnancy, urine     Status: None   Collection Time: 03/13/14  5:55 AM  Result Value Ref Range   Preg Test, Ur NEGATIVE NEGATIVE  Glucose, capillary     Status: Abnormal   Collection Time: 03/13/14  6:19 AM  Result Value Ref Range   Glucose-Capillary 128 (H) 70 - 99 mg/dL    No results found.  Assessment/Plan: 20 G0 MAAF here for hysteroscopy, polyp resection, D&C due to PMP bleeding and endometrial polyps on SHGM.  Pt here  with spouse and ready to proceed.  Hale Bogus SUZANNE 03/13/2014, 6:45 AM

## 2014-03-13 NOTE — Op Note (Signed)
03/13/2014  7:58 AM  PATIENT:  Yolanda [name]  54 y.o. female  PRE-OPERATIVE DIAGNOSIS:  PMB, endometial polyp, uterine fibroids  POST-OPERATIVE DIAGNOSIS:  PMB, endometrial polyp, uterine fibroids  PROCEDURE:  Procedure(s): DILATATION & CURETTAGE/HYSTEROSCOPY   SURGEON:  Makia Bossi SUZANNE  ASSISTANTS: OR staff   ANESTHESIA:   general  ESTIMATED BLOOD LOSS: 10cc  BLOOD ADMINISTERED:none   FLUIDS: 1000cc LR  UOP: 20cc with I&O cath  SPECIMEN:  Endometrial curettings  DISPOSITION OF SPECIMEN:  PATHOLOGY  FINDINGS: fluffy appearing endometrium, anterior fibroid just visible with minimal intracavitary component  DESCRIPTION OF OPERATION: Patient was taken to the operating room.  She is placed in the supine position. SCDs were on her lower extremities and functioning properly. General anesthesia with an LMA was administered without difficulty.   Legs were then placed in the [place] in the low lithotomy position. The legs were lifted to the high lithotomy position and the Betadine prep was used on the inner thighs perineum and vagina x3. Patient was draped in a normal standard fashion. An in and out catheterization with a red rubber Foley catheter was performed. Approximately 20 cc of clear urine was noted. A bivalve speculum was placed the vagina. The anterior lip of the cervix was grasped with single-tooth tenaculum.  A paracervical block of 1% lidocaine mixed one-to-one with epinephrine (1:100,000 units).  10 cc was used total. The cervix is dilated up to #[facility] dilators. The endometrial cavity sounded to 8.5 cm.   A 2.9 millimeter diagnostic hysteroscope was obtained. 1.5% glycine was used as a hysteroscopic fluid. The hysteroscope was advanced through the endocervical canal into the endometrial cavity. The tubal ostia were noted bilaterally.  Several small polyp appearing lesions noted but these were all small.  The hysteroscope was removed. A #1 toothed curette was  used to curette the cavity until rough gritty texture was noted in all quadrants. With revisualization of the hysteroscope this area was now removed.  There now appears to be a very small portion of fibroid that can be seen.  There is minimal intracavitary component.  This fibroid on ultrasound measures around 2cm.  I am certain that I will not be able to resect this completely and with only a small portion of it in the cavity, I did not proceed with resection.  At this point, procedure was ended.  The fluid deficit was 30 cc. The tenaculum was removed from the anterior lip of the cervix. The speculum was returned vagina. The prep was cleansed of the patient's skin. The legs are positioned back in the supine position. Sponge, lap, needle, initially counts were correct x2. Patient was taken to recovery in stable condition.  COUNTS:  YES  PLAN OF CARE: Transfer to PACU

## 2014-03-13 NOTE — Anesthesia Procedure Notes (Signed)
Procedure Name: LMA Insertion Date/Time: 03/13/2014 7:21 AM Performed by: Jonna Munro Pre-anesthesia Checklist: Patient identified, Emergency Drugs available, Suction available, Patient being monitored and Timeout performed Patient Re-evaluated:Patient Re-evaluated prior to inductionOxygen Delivery Method: Circle system utilized Preoxygenation: Pre-oxygenation with 100% oxygen Intubation Type: IV induction LMA Size: 4.0 Number of attempts: 1 Placement Confirmation: positive ETCO2 and breath sounds checked- equal and bilateral Dental Injury: Teeth and Oropharynx as per pre-operative assessment

## 2014-03-13 NOTE — Discharge Instructions (Addendum)
Post-surgical Instructions, Outpatient Surgery  You may expect to feel dizzy, weak, and drowsy for as long as 24 hours after receiving the medicine that made you sleep (anesthetic). For the first 24 hours after your surgery:    Do not drive a car, ride a bicycle, participate in physical activities, or take public transportation until you are done taking narcotic pain medicines or as directed by Dr. Sabra Heck.   Do not drink alcohol or take tranquilizers.   Do not take medicine that has not been prescribed by your physicians.   Do not sign important papers or make important decisions while on narcotic pain medicines.   Have a responsible person with you.   PAIN MANAGEMENT  Motrin 800mg .  (This is the same as 4-200mg  over the counter tablets of Motrin or ibuprofen.)  You may take this every eight hours or as needed for cramping.    Vicodin 5/325mg .  For more severe pain, take one or two tablets every four to six hours as needed for pain control.  (Remember that narcotic pain medications increase your risk of constipation.  If this becomes a problem, you may take an over the counter stool softener like Colace 100mg  up to four times a day.)  DO'S AND DON'T'S  Do not take a tub bath for one week.  You may shower on the first day after your surgery  Do not do any heavy lifting for one to two weeks.  This increases the chance of bleeding.  Do move around as you feel able.  Stairs are fine.  You may begin to exercise again as you feel able.  Do not lift any weights for two weeks.  Do not put anything in the vagina for two weeks--no tampons, intercourse, or douching.    REGULAR MEDIATIONS/VITAMINS:  You may restart all of your regular medications as prescribed.  You may restart all of your vitamins as you normally take them.    PLEASE CALL OR SEEK MEDICAL CARE IF:  You have persistent nausea and vomiting.   You have trouble eating or drinking.   You have an oral temperature above 100.5.    You have constipation that is not helped by adjusting diet or increasing fluid intake. Pain medicines are a common cause of constipation.   You have heavy vaginal bleeding  You have redness or drainage from your incision(s) or there is increasing pain or tenderness near or in the surgical site.     DISCHARGE INSTRUCTIONS: D&C / D&E The following instructions have been prepared to help you care for yourself upon your return home.   Personal hygiene:  Use sanitary pads for vaginal drainage, not tampons.  Shower the day after your procedure.  NO tub baths, pools or Jacuzzis for 2-3 weeks.  Wipe front to back after using the bathroom.  Activity and limitations:  Do NOT drive or operate any equipment for 24 hours. The effects of anesthesia are still present and drowsiness may result.  Do NOT rest in bed all day.  Walking is encouraged.  Walk up and down stairs slowly.  You may resume your normal activity in one to two days or as indicated by your physician.  Sexual activity: NO intercourse for at least 2 weeks after the procedure, or as indicated by your physician.  Diet: Eat a light meal as desired this evening. You may resume your usual diet tomorrow.  Return to work: You may resume your work activities in one to two days or as  indicated by your doctor.  What to expect after your surgery: Expect to have vaginal bleeding/discharge for 2-3 days and spotting for up to 10 days. It is not unusual to have soreness for up to 1-2 weeks. You may have a slight burning sensation when you urinate for the first day. Mild cramps may continue for a couple of days. You may have a regular period in 2-6 weeks.  Call your doctor for any of the following:  Excessive vaginal bleeding, saturating and changing one pad every hour.  Inability to urinate 6 hours after discharge from hospital.  Pain not relieved by pain medication.  Fever of 100.4 F or greater.  Unusual vaginal discharge or  odor.   Call for an appointment:    Patients signature: ______________________  Nurses signature ________________________  Support person's signature_______________________    DO NOT TAKE ANY IBUPROFEN PRODUCTS UNTIL 2 PM TODAY.

## 2014-03-13 NOTE — Anesthesia Postprocedure Evaluation (Signed)
Anesthesia Post Note  Patient: Yolanda [name]  Procedure(s) Performed: Procedure(s) (LRB): DILATATION & CURETTAGE/HYSTEROSCOPY  (N/A)  Anesthesia type: General  Patient location: PACU  Post pain: Pain level controlled  Post assessment: Post-op Vital signs reviewed  Last Vitals:  Filed Vitals:   03/13/14 0815  BP: 126/70  Pulse: 87  Temp:   Resp: 13    Post vital signs: Reviewed  Level of consciousness: sedated  Complications: No apparent anesthesia complications

## 2014-03-13 NOTE — Transfer of Care (Signed)
Immediate Anesthesia Transfer of Care Note  Patient: Yolanda [name]  Procedure(s) Performed: Procedure(s): DILATATION & CURETTAGE/HYSTEROSCOPY  (N/A)  Patient Location: PACU  Anesthesia Type:General  Level of Consciousness: awake, alert  and oriented  Airway & Oxygen Therapy: Patient Spontanous Breathing and Patient connected to nasal cannula oxygen  Post-op Assessment: Report given to PACU RN and Post -op Vital signs reviewed and stable  Post vital signs: Reviewed and stable  Complications: No apparent anesthesia complications

## 2014-03-14 ENCOUNTER — Encounter (HOSPITAL_COMMUNITY): Payer: Self-pay | Admitting: Obstetrics & Gynecology

## 2014-03-26 ENCOUNTER — Ambulatory Visit (INDEPENDENT_AMBULATORY_CARE_PROVIDER_SITE_OTHER): Payer: 59 | Admitting: Obstetrics & Gynecology

## 2014-03-26 ENCOUNTER — Encounter: Payer: Self-pay | Admitting: Obstetrics & Gynecology

## 2014-03-26 VITALS — BP 128/80 | HR 64 | Resp 16 | Wt 189.4 lb

## 2014-03-26 DIAGNOSIS — N840 Polyp of corpus uteri: Secondary | ICD-10-CM

## 2014-03-26 NOTE — Progress Notes (Signed)
Post Operative Visit  Procedure: D&C, hysteroscopy Days Post-op: 2 weeks  Subjective: Doing well.  No vaginal bleeding in about 10 days.  Still having ocasional cramping.  This is not requiring pain medication.  Reviewed pathology reports and pictures with pt.  Finding of fibroid pressing into anterior endometrial cavity discussed.  No fevers.  No back pain.  No vaginal discharge.    Objective: BP 128/80 mmHg  Pulse 64  Resp 16  Wt 189 lb 6.4 oz (85.911 kg)  LMP 02/21/2014  EXAM General: alert and cooperative GI: soft, non-tender; bowel sounds normal; no masses,  no organomegaly Extremities: extremities normal, atraumatic, no cyanosis or edema Vaginal Bleeding: minimal  Gyn: NAEFG, cervix close, scant discharge, no CMT  Assessment: s/p hysteroscopic polyp resection, D&C due to endometrial polyps  Plan: AEX scheduled for 09/07/14 with me.  Pt knows to call with any vaginal bleeding.

## 2014-08-01 ENCOUNTER — Other Ambulatory Visit: Payer: Self-pay

## 2014-08-01 DIAGNOSIS — Z1231 Encounter for screening mammogram for malignant neoplasm of breast: Secondary | ICD-10-CM

## 2014-08-13 ENCOUNTER — Other Ambulatory Visit: Payer: Self-pay

## 2014-09-07 ENCOUNTER — Encounter: Payer: Self-pay | Admitting: Obstetrics & Gynecology

## 2014-09-07 ENCOUNTER — Ambulatory Visit (INDEPENDENT_AMBULATORY_CARE_PROVIDER_SITE_OTHER): Payer: 59 | Admitting: Obstetrics & Gynecology

## 2014-09-07 VITALS — BP 134/80 | HR 72 | Resp 16 | Ht 63.75 in | Wt 181.0 lb

## 2014-09-07 DIAGNOSIS — Z124 Encounter for screening for malignant neoplasm of cervix: Secondary | ICD-10-CM | POA: Diagnosis not present

## 2014-09-07 DIAGNOSIS — Z01419 Encounter for gynecological examination (general) (routine) without abnormal findings: Secondary | ICD-10-CM

## 2014-09-07 DIAGNOSIS — I10 Essential (primary) hypertension: Secondary | ICD-10-CM

## 2014-09-07 DIAGNOSIS — E119 Type 2 diabetes mellitus without complications: Secondary | ICD-10-CM | POA: Diagnosis not present

## 2014-09-07 DIAGNOSIS — Z0000 Encounter for general adult medical examination without abnormal findings: Secondary | ICD-10-CM

## 2014-09-07 NOTE — Progress Notes (Signed)
54 y.o. G0P0 MarriedAfrican AmericanF here for annual exam.  No vaginal bleeding.  Was seen in ER due to pain in her leg.  Was diagnosed as muscle spasm.  Pt reports still mildly there but does not want any additional follow up at this time.  It is much better.  No vaginal bleeding since January.    Needs PCP referral.  Pt does need blood work today.   Patient's last menstrual period was 02/21/2014.          Sexually active: Yes.    The current method of family planning is post menopausal status.    Exercising: Yes.    walking and stretching Smoker:  no  Health Maintenance: Pap:  08/01/12 WNL/negative HR HPV History of abnormal Pap:  no MMG:  09/05/13 3D-normal, scheduled for next week Colonoscopy:  2014-repeat in 5 years Dr Collene Mares BMD:   09/05/13-normal TDaP:  6/08 Screening Labs: today, Hb today: 12.9, Urine today: not able to give sample   reports that she has never smoked. She has never used smokeless tobacco. She reports that she drinks alcohol. She reports that she does not use illicit drugs.  Past Medical History  Diagnosis Date  . Diabetes mellitus without complication     type II  . Fibroid uterus   . Refusal of blood transfusions as patient is Jehovah's Witness     NO BLOOD PRODUCTS  . Hypertension   . Sleep apnea   . Asthma     with respiratory infection  . PONV (postoperative nausea and vomiting)     pt stopped breathing during colonoscopy    Past Surgical History  Procedure Laterality Date  . Cholecystectomy  1/11  . Colonoscopy    . Dilatation & currettage/hysteroscopy with resectocope N/A 03/13/2014    Procedure: DILATATION & CURETTAGE/HYSTEROSCOPY ;  Surgeon: Lyman Speller, MD;  Location: [facility];  Service: Gynecology;  Laterality: N/A;    Current Outpatient Prescriptions  Medication Sig Dispense Refill  . BAYER CONTOUR NEXT TEST test strip     . Cholecalciferol (VITAMIN D PO) Take 1 capsule by mouth daily.    . hydrochlorothiazide (HYDRODIURIL) 25 MG  tablet Take 1 tablet (25 mg total) by mouth daily. 30 tablet 5  . metFORMIN (GLUCOPHAGE-XR) 500 MG 24 hr tablet Take 1,000 mg by mouth 2 (two) times daily.    . clobetasol ointment (TEMOVATE) 5.46 % Apply 1 application topically 2 (two) times daily. (Patient not taking: Reported on 02/23/2014) 30 g 0   No current facility-administered medications for this visit.    Family History  Problem Relation Age of Onset  . Asthma Mother   . Diabetes Father   . Asthma Brother   . Asthma Brother   . Diabetes Brother   . Diabetes Brother   . Heart Problems Sister   . Hypertension Brother   . Hypertension Sister   . Heart attack Father   . Osteopenia Sister     ROS:  Pertinent items are noted in HPI.  Otherwise, a comprehensive ROS was negative.  Exam:   LMP 02/21/2014  Weight change: +3#  Height:      Ht Readings from Last 3 Encounters:  03/06/14 5\' 3"  (1.6 m)  02/21/14 5\' 3"  (1.6 m)  08/10/13 5\' 3"  (1.6 m)    General appearance: alert, cooperative and appears stated age Head: Normocephalic, without obvious abnormality, atraumatic Neck: no adenopathy, supple, symmetrical, trachea midline and thyroid normal to inspection and palpation Lungs: clear to auscultation bilaterally  Breasts: normal appearance, no masses or tenderness Heart: regular rate and rhythm Abdomen: soft, non-tender; bowel sounds normal; no masses,  no organomegaly Extremities: extremities normal, atraumatic, no cyanosis or edema Skin: Skin color, texture, turgor normal. No rashes or lesions Lymph nodes: Cervical, supraclavicular, and axillary nodes normal. No abnormal inguinal nodes palpated Neurologic: Grossly normal   Pelvic: External genitalia:  no lesions              Urethra:  normal appearing urethra with no masses, tenderness or lesions              Bartholins and Skenes: normal                 Vagina: normal appearing vagina with normal color and discharge, no lesions              Cervix: no lesions               Pap taken: Yes.   Bimanual Exam:  Uterus:  enlarged, 10 weeks, mobile weeks size              Adnexa: no mass, fullness, tenderness               Rectovaginal: Confirms               Anus:  normal sphincter tone, no lesions  Chaperone was present for exam.  A:  Well Woman with normal exam H/O fibroids Diabetes Hypertension H/O hysteroscopy with polyp resection 1/16 Jehovah's witness  P: Mammogram yearly. Has appt scheduled for next week. Pap smear only.  pap smear with HR HPV 2014 Referral to PCP.  Order placed.  TSH, Vit D, Lipid, HbA1C, CMP return annually or prn

## 2014-09-08 LAB — HEMOGLOBIN A1C
HEMOGLOBIN A1C: 7.4 % — AB (ref <5.7)
Mean Plasma Glucose: 166 mg/dL — ABNORMAL HIGH (ref <117)

## 2014-09-08 LAB — LIPID PANEL
Cholesterol: 175 mg/dL (ref 0–200)
HDL: 66 mg/dL (ref >=46)
LDL CALC: 84 mg/dL (ref 0–99)
TRIGLYCERIDES: 126 mg/dL (ref <150)
Total CHOL/HDL Ratio: 2.7 Ratio
VLDL: 25 mg/dL (ref 0–40)

## 2014-09-08 LAB — COMPREHENSIVE METABOLIC PANEL
ALT: 31 U/L (ref 0–35)
AST: 23 U/L (ref 0–37)
Albumin: 4.1 g/dL (ref 3.5–5.2)
Alkaline Phosphatase: 119 U/L — ABNORMAL HIGH (ref 39–117)
BUN: 15 mg/dL (ref 6–23)
CHLORIDE: 102 meq/L (ref 96–112)
CO2: 27 meq/L (ref 19–32)
CREATININE: 0.6 mg/dL (ref 0.50–1.10)
Calcium: 10.1 mg/dL (ref 8.4–10.5)
Glucose, Bld: 172 mg/dL — ABNORMAL HIGH (ref 70–99)
Potassium: 4.6 mEq/L (ref 3.5–5.3)
Sodium: 142 mEq/L (ref 135–145)
Total Bilirubin: 0.3 mg/dL (ref 0.2–1.2)
Total Protein: 6.8 g/dL (ref 6.0–8.3)

## 2014-09-08 LAB — VITAMIN D 25 HYDROXY (VIT D DEFICIENCY, FRACTURES): Vit D, 25-Hydroxy: 34 ng/mL (ref 30–100)

## 2014-09-08 LAB — TSH: TSH: 2.732 u[IU]/mL (ref 0.350–4.500)

## 2014-09-10 ENCOUNTER — Telehealth: Payer: Self-pay

## 2014-09-10 NOTE — Telephone Encounter (Signed)
Lmtcb//kn 

## 2014-09-10 NOTE — Telephone Encounter (Signed)
-----   Message from Megan Salon, MD sent at 09/10/2014 11:50 AM EDT ----- Inform pt Vit D better at 34.  Continue the OTC Vit D she is taking.  TSH normal.  Lipids fine.  CMP showed elevated glucose and elevated HbA1C.  She will not be surprised by this.  Referred to PCP was placed with office visit.  Please let her know I want to know if referral not done for her by the end of the week.  Thanks.

## 2014-09-11 ENCOUNTER — Ambulatory Visit
Admission: RE | Admit: 2014-09-11 | Discharge: 2014-09-11 | Disposition: A | Discharge status: Home / Self Care | Payer: 59 | Source: Ambulatory Visit

## 2014-09-11 DIAGNOSIS — Z1231 Encounter for screening mammogram for malignant neoplasm of breast: Secondary | ICD-10-CM

## 2014-09-11 LAB — IPS PAP SMEAR ONLY

## 2014-09-17 ENCOUNTER — Telehealth: Payer: Self-pay | Admitting: Obstetrics & Gynecology

## 2014-09-24 NOTE — Telephone Encounter (Signed)
Patient notified of all results.//kn 

## 2015-01-16 ENCOUNTER — Telehealth: Payer: Self-pay | Admitting: Infectious Diseases

## 2015-01-16 NOTE — Telephone Encounter (Signed)
Called by ENT for recurrent infection Will try to get her scheduled into ID clinic.

## 2015-01-28 ENCOUNTER — Encounter: Payer: Self-pay | Admitting: Internal Medicine

## 2015-01-28 ENCOUNTER — Ambulatory Visit (INDEPENDENT_AMBULATORY_CARE_PROVIDER_SITE_OTHER): Payer: 59 | Admitting: Internal Medicine

## 2015-01-28 VITALS — BP 145/82 | HR 109 | Temp 98.0°F | Ht 63.0 in | Wt 187.5 lb

## 2015-01-28 DIAGNOSIS — H6092 Unspecified otitis externa, left ear: Secondary | ICD-10-CM | POA: Insufficient documentation

## 2015-01-28 DIAGNOSIS — Z23 Encounter for immunization: Secondary | ICD-10-CM

## 2015-01-28 MED ORDER — CLOTRIMAZOLE 1 % EX SOLN: 1.0000 "application " | Freq: Two times a day (BID) | CUTANEOUS | Status: DC

## 2015-01-28 NOTE — Assessment & Plan Note (Signed)
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 I will change clotrimazole cream to 1% solution to get better penetration deeper into the canal. She tells me that she had repeat cultures done last week. We have requested those records from Dr. Berle Mull office. She will follow-up with me in about 10 days.

## 2015-01-28 NOTE — Addendum Note (Signed)
Addended by: Lorne Skeens D on: 01/28/2015 04:53 PM   Modules accepted: Orders

## 2015-01-28 NOTE — Progress Notes (Addendum)
[facility]  Reason for Consult: External otitis of her left ear Referring Physician: Dr. Lorane Gell  Patient Active Problem List   Diagnosis Date Noted  . External otitis of left ear 01/28/2015    Priority: High  . Diabetes ([place]) 02/23/2014  . Abnormal liver enzymes 02/23/2014  . Intramural leiomyoma of uterus 02/23/2014  . OBESITY 11/26/2009  . EDEMA- LOCALIZED 10/16/2009  . OBSTRUCTIVE SLEEP APNEA 06/01/2008  . HYPERTENSION 06/01/2008  . ALLERGIC RHINITIS 06/01/2008  . HEADACHE, CHRONIC 06/01/2008  . Essential (primary) hypertension 06/01/2008  . SLEEP APNEA 03/27/2008  . CONTACT DERMATITIS 10/14/2006    Patient's Medications  New Prescriptions   CLOTRIMAZOLE (LOTRIMIN) 1 % EXTERNAL SOLUTION    Apply 1 application topically 2 (two) times daily. Apply 2-3 drops in left ear 2 times daily.  Previous Medications   BAYER CONTOUR NEXT TEST TEST STRIP       CHOLECALCIFEROL (VITAMIN D PO)    Take 1 capsule by mouth daily.   CLOBETASOL OINTMENT (TEMOVATE) 0.05 %    Apply 1 application topically 2 (two) times daily.   HYDROCHLOROTHIAZIDE (HYDRODIURIL) 25 MG TABLET    Take 1 tablet (25 mg total) by mouth daily.   METFORMIN (GLUCOPHAGE-XR) 500 MG 24 HR TABLET    Take 1,000 mg by mouth 2 (two) times daily.  Modified Medications   No medications on file  Discontinued Medications   CLOTRIMAZOLE (LOTRIMIN AF) 1 % CREAM    Apply 1 application topically daily.    Recommendations: 1. Change clotrimazole cream to clotrimazole 1% solution, 2-3 drops in left ear twice daily 2. Continue regular cleaning of that year by Dr. Ernesto Rutherford 3. 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 I will change clotrimazole cream to 1% solution to get better penetration deeper into the canal. She tells me that she had repeat cultures done last week. We have requested those records from Dr. Berle Mull office. She will  follow-up with me in about 10 days.   HPI: Yolanda [name] is a 54 y.o. female who had sudden onset of drainage from her left ear on 11/12/2014. She describes the drainage as clear and sticky, "like glue". She has had slight intermittent left ear pain. She's had no change in her hearing or ringing in her ears. She recalls having some sort of ear infection about 10 years ago but cannot remember what her symptoms were or if it was her left ear or her right ear. She has diabetes and tells me that a recent hemoglobin A1c was 6.5. She saw Dr. Ernesto Rutherford on 11/13/2014. He has been cleaning her here on a weekly basis. Cultures of debris from her left ear on 12/04/2014 grew a sensitive enterococcus and Aspergillus species. She has been treated with oral doxycycline, ciprofloxacin, levofloxacin and fluconazole but states that these did not help. She tried Ciprodex eardrops but found that this made the drainage even more sticky and hard to remove. She started on clotrimazole cream about 1-1/2 weeks ago. She applies a small amount of the cream to a Q-tip and places it in her ear. She states that she only sticks the very tip of the Q-tip into her ear. She is applying that once daily.  Review of Systems: Review of Systems  Constitutional: Negative for fever, chills, weight loss and diaphoresis.  HENT: Positive for ear discharge and ear pain. Negative for hearing loss and tinnitus.  Respiratory: Negative for cough and sputum production.   Cardiovascular: Negative for chest pain.  Skin:       She does describe some itching of her left external canal.  Neurological: Negative for headaches.      Past Medical History  Diagnosis Date  . Diabetes mellitus without complication ([place])     type II  . Fibroid uterus   . Refusal of blood transfusions as patient is Jehovah's Witness     NO BLOOD PRODUCTS  . Hypertension   . Sleep apnea   . Asthma     with respiratory infection  . PONV (postoperative nausea and  vomiting)     pt stopped breathing during colonoscopy    Social History  Substance Use Topics  . Smoking status: Never Smoker   . Smokeless tobacco: Never Used  . Alcohol Use: 0.0 oz/week    0 Standard drinks or equivalent per week     Comment: occ    Family History  Problem Relation Age of Onset  . Asthma Mother   . Diabetes Father   . Asthma Brother   . Asthma Brother   . Diabetes Brother   . Diabetes Brother   . Heart Problems Sister   . Hypertension Brother   . Hypertension Sister   . Heart attack Father   . Osteopenia Sister    No Known Allergies  OBJECTIVE: Filed Vitals:   01/28/15 1539  BP: 145/82  Pulse: 109  Temp: 98 F (36.7 C)  TempSrc: Oral  Height: 5\' 3"  (1.6 m)  Weight: 187 lb 8 oz (85.049 kg)   Body mass index is 33.22 kg/(m^2).   Physical Exam  Constitutional:  She is smiling and in no distress.  HENT:  Mouth/Throat: No oropharyngeal exudate.  There is thick white exudate obscuring about half of her left external canal. She has slight tenderness with pressure over the tragus. Her right ear appears normal.  Eyes: Conjunctivae are normal.  There is thick white exudate obscuring about half of her external canal. She has slight tenderness with pressure over the tragus.    Microbiology: No results found for this or any previous visit (from the past 240 hour(s)).  Michel Bickers, MD [facility] [phone] pager   [phone] cell 01/28/2015, 4:17 PM

## 2015-02-06 ENCOUNTER — Ambulatory Visit (INDEPENDENT_AMBULATORY_CARE_PROVIDER_SITE_OTHER): Payer: 59 | Admitting: Internal Medicine

## 2015-02-06 VITALS — BP 114/80 | HR 91 | Temp 98.2°F | Wt 184.9 lb

## 2015-02-06 DIAGNOSIS — H6092 Unspecified otitis externa, left ear: Secondary | ICD-10-CM | POA: Diagnosis not present

## 2015-02-06 NOTE — Assessment & Plan Note (Signed)
She is improving on therapy for probable fungal external otitis. I will have her continue the clotrimazole drops and follow-up with me next month.

## 2015-02-06 NOTE — Progress Notes (Signed)
[facility]  Patient Active Problem List   Diagnosis Date Noted  . External otitis of left ear 01/28/2015    Priority: High  . Diabetes ([place]) 02/23/2014  . Abnormal liver enzymes 02/23/2014  . Intramural leiomyoma of uterus 02/23/2014  . OBESITY 11/26/2009  . EDEMA- LOCALIZED 10/16/2009  . OBSTRUCTIVE SLEEP APNEA 06/01/2008  . HYPERTENSION 06/01/2008  . ALLERGIC RHINITIS 06/01/2008  . HEADACHE, CHRONIC 06/01/2008  . Essential (primary) hypertension 06/01/2008  . SLEEP APNEA 03/27/2008  . CONTACT DERMATITIS 10/14/2006    Patient's Medications  New Prescriptions   No medications on file  Previous Medications   BAYER CONTOUR NEXT TEST TEST STRIP       CHOLECALCIFEROL (VITAMIN D PO)    Take 1 capsule by mouth daily. Reported on 02/06/2015   CLOBETASOL OINTMENT (TEMOVATE) 0.05 %    Apply 1 application topically 2 (two) times daily.   CLOTRIMAZOLE (LOTRIMIN) 1 % EXTERNAL SOLUTION    Apply 1 application topically 2 (two) times daily. Apply 2-3 drops in left ear 2 times daily.   HYDROCHLOROTHIAZIDE (HYDRODIURIL) 25 MG TABLET    Take 1 tablet (25 mg total) by mouth daily.   METFORMIN (GLUCOPHAGE-XR) 500 MG 24 HR TABLET    Take 1,000 mg by mouth 2 (two) times daily.  Modified Medications   No medications on file  Discontinued Medications   No medications on file    Subjective: Yolanda [name] is in for her initial follow-up visit. She is feeling better. She is not having any left ear pain, tinnitus or difficulty hearing. She started using the clotrimazole solution drops in her left ear 10 days ago.  Review of Systems: Review of Systems  Constitutional: Negative for fever.  HENT: Negative for ear pain, hearing loss and tinnitus.     Past Medical History  Diagnosis Date  . Diabetes mellitus without complication ([place])     type II  . Fibroid uterus   . Refusal of blood transfusions as patient is Jehovah's Witness     NO BLOOD PRODUCTS  .  Hypertension   . Sleep apnea   . Asthma     with respiratory infection  . PONV (postoperative nausea and vomiting)     pt stopped breathing during colonoscopy    Social History  Substance Use Topics  . Smoking status: Never Smoker   . Smokeless tobacco: Never Used  . Alcohol Use: 0.0 oz/week    0 Standard drinks or equivalent per week     Comment: occ    Family History  Problem Relation Age of Onset  . Asthma Mother   . Diabetes Father   . Asthma Brother   . Asthma Brother   . Diabetes Brother   . Diabetes Brother   . Heart Problems Sister   . Hypertension Brother   . Hypertension Sister   . Heart attack Father   . Osteopenia Sister     No Known Allergies  Objective: Filed Vitals:   02/06/15 1556  BP: 114/80  Pulse: 91  Temp: 98.2 F (36.8 C)  Weight: 184 lb 14.4 oz (83.87 kg)   Body mass index is 32.76 kg/(m^2).  Physical Exam  Constitutional:  She is pleasant and in good spirits.  HENT:  Right Ear: External ear normal.  Mouth/Throat: No oropharyngeal exudate.  She has a little bit of redness and scaling in the external canal. The white exudate is significantly decreased from her previous visit.  The central portion of her tympanic membrane is visible and appears relatively normal.    Lab Results    Problem List Items Addressed This Visit      High   External otitis of left ear - Primary    She is improving on therapy for probable fungal external otitis. I will have her continue the clotrimazole drops and follow-up with me next month.          Michel Bickers, MD [facility] [phone] pager   [phone] cell 02/06/2015, 4:12 PM

## 2015-02-21 ENCOUNTER — Encounter: Payer: Self-pay | Admitting: Internal Medicine

## 2015-03-04 ENCOUNTER — Encounter: Payer: Self-pay | Admitting: Family Medicine

## 2015-03-04 ENCOUNTER — Ambulatory Visit (INDEPENDENT_AMBULATORY_CARE_PROVIDER_SITE_OTHER): Payer: 59 | Admitting: Family Medicine

## 2015-03-04 VITALS — BP 138/84 | HR 80 | Temp 98.8°F | Ht 63.0 in | Wt 187.5 lb

## 2015-03-04 DIAGNOSIS — E119 Type 2 diabetes mellitus without complications: Secondary | ICD-10-CM | POA: Diagnosis not present

## 2015-03-04 DIAGNOSIS — E669 Obesity, unspecified: Secondary | ICD-10-CM

## 2015-03-04 DIAGNOSIS — R51 Headache: Secondary | ICD-10-CM

## 2015-03-04 DIAGNOSIS — R519 Headache, unspecified: Secondary | ICD-10-CM

## 2015-03-04 DIAGNOSIS — M79643 Pain in unspecified hand: Secondary | ICD-10-CM

## 2015-03-04 DIAGNOSIS — H6092 Unspecified otitis externa, left ear: Secondary | ICD-10-CM

## 2015-03-04 DIAGNOSIS — M1990 Unspecified osteoarthritis, unspecified site: Secondary | ICD-10-CM

## 2015-03-04 DIAGNOSIS — I10 Essential (primary) hypertension: Secondary | ICD-10-CM | POA: Diagnosis not present

## 2015-03-04 MED ORDER — LISINOPRIL 5 MG PO TABS: 5.0000 mg | ORAL_TABLET | Freq: Every day | ORAL | Status: DC

## 2015-03-04 NOTE — Patient Instructions (Addendum)
Salonpas and aspercreme gel, Fatty acid vitamins, krill oil, Probiotic  Hydrogen peroxide 2 -5 drops in ears daily after showering.  Basic Carbohydrate Counting for Diabetes Mellitus Carbohydrate counting is a method for keeping track of the amount of carbohydrates you eat. Eating carbohydrates naturally increases the level of sugar (glucose) in your blood, so it is important for you to know the amount that is okay for you to have in every meal. Carbohydrate counting helps keep the level of glucose in your blood within normal limits. The amount of carbohydrates allowed is different for every person. A dietitian can help you calculate the amount that is right for you. Once you know the amount of carbohydrates you can have, you can count the carbohydrates in the foods you want to eat. Carbohydrates are found in the following foods:  Grains, such as breads and cereals.  Dried beans and soy products.  Starchy vegetables, such as potatoes, peas, and corn.  Fruit and fruit juices.  Milk and yogurt.  Sweets and snack foods, such as cake, cookies, candy, chips, soft drinks, and fruit drinks. CARBOHYDRATE COUNTING There are two ways to count the carbohydrates in your food. You can use either of the methods or a combination of both. Reading the "Nutrition Facts" on [place] The "Nutrition Facts" is an area that is included on the labels of almost all packaged food and beverages in the [place]. It includes the serving size of that food or beverage and information about the nutrients in each serving of the food, including the grams (g) of carbohydrate per serving.  Decide the number of servings of this food or beverage that you will be able to eat or drink. Multiply that number of servings by the number of grams of carbohydrate that is listed on the label for that serving. The total will be the amount of carbohydrates you will be having when you eat or drink this food or beverage. Learning  Standard Serving Sizes of Food When you eat food that is not packaged or does not include "Nutrition Facts" on the label, you need to measure the servings in order to count the amount of carbohydrates.A serving of most carbohydrate-rich foods contains about 15 g of carbohydrates. The following list includes serving sizes of carbohydrate-rich foods that provide 15 g ofcarbohydrate per serving:   1 slice of bread (1 oz) or 1 six-inch tortilla.    of a hamburger bun or English muffin.  4-6 crackers.   cup unsweetened dry cereal.    cup hot cereal.   cup rice or pasta.    cup mashed potatoes or  of a large baked potato.  1 cup fresh fruit or one small piece of fruit.    cup canned or frozen fruit or fruit juice.  1 cup milk.   cup plain fat-free yogurt or yogurt sweetened with artificial sweeteners.   cup cooked dried beans or starchy vegetable, such as peas, corn, or potatoes.  Decide the number of standard-size servings that you will eat. Multiply that number of servings by 15 (the grams of carbohydrates in that serving). For example, if you eat 2 cups of strawberries, you will have eaten 2 servings and 30 g of carbohydrates (2 servings x 15 g = 30 g). For foods such as soups and casseroles, in which more than one food is mixed in, you will need to count the carbohydrates in each food that is included. EXAMPLE OF CARBOHYDRATE COUNTING Sample Dinner  3 oz chicken  breast.   cup of brown rice.   cup of corn.  1 cup milk.   1 cup strawberries with sugar-free whipped topping.  Carbohydrate Calculation Step 1: Identify the foods that contain carbohydrates:   Rice.   Corn.   Milk.   Strawberries. Step 2:Calculate the number of servings eaten of each:   2 servings of rice.   1 serving of corn.   1 serving of milk.   1 serving of strawberries. Step 3: Multiply each of those number of servings by 15 g:   2 servings of rice x 15 g = 30 g.    1 serving of corn x 15 g = 15 g.   1 serving of milk x 15 g = 15 g.   1 serving of strawberries x 15 g = 15 g. Step 4: Add together all of the amounts to find the total grams of carbohydrates eaten: 30 g + 15 g + 15 g + 15 g = 75 g.   This information is not intended to replace advice given to you by your health care provider. Make sure you discuss any questions you have with your health care provider.   Document Released: 02/02/2005 Document Revised: 02/23/2014 Document Reviewed: 12/30/2012 Elsevier Interactive Patient Education [employer].

## 2015-03-04 NOTE — Progress Notes (Signed)
Subjective:    Patient ID: Yolanda [name], female    DOB: 1960/04/22, 55 y.o.   MRN: [id number]  Chief Complaint  Patient presents with  . Establish Care    HPI Patient is in today for new patient appointment. She is in today complaining mostly of some pain. She has some diffuse joint pain for the past year but no warmth or erythema. No swelling. She also notes some increase in left heel pain recently. No injury. Blood sugars have been running in the 120s to 160s. She denies polyuria or polydipsia. Has had a rash over her left ear and has been using clotrimazole with some relief. Denies CP/palp/SOB/HA/congestion/fevers/GI or GU c/o. Taking meds as prescribed  Past Medical History  Diagnosis Date  . Diabetes mellitus without complication ([place])     type II  . Fibroid uterus   . Refusal of blood transfusions as patient is Jehovah's Witness     NO BLOOD PRODUCTS  . Hypertension   . Sleep apnea   . Asthma     with respiratory infection  . PONV (postoperative nausea and vomiting)     pt stopped breathing during colonoscopy  . Osteoarthritis 03/10/2015    Past Surgical History  Procedure Laterality Date  . Cholecystectomy  1/11  . Colonoscopy    . Dilatation & currettage/hysteroscopy with resectocope N/A 03/13/2014    Procedure: DILATATION & CURETTAGE/HYSTEROSCOPY ;  Surgeon: Lyman Speller, MD;  Location: [facility];  Service: Gynecology;  Laterality: N/A;    Family History  Problem Relation Age of Onset  . Asthma Mother   . Diabetes Mother   . Diabetes Father   . Heart attack Father   . Diabetes Brother   . Diabetes Brother   . Asthma Brother   . Heart Problems Sister   . Asthma Sister   . Hypertension Sister   . Osteopenia Sister   . Heart disease Maternal Grandfather     Social History   Social History  . Marital Status: Married    Spouse Name: N/A  . Number of Children: N/A  . Years of Education: N/A   Occupational History  . Not on file.   Social  History Main Topics  . Smoking status: Never Smoker   . Smokeless tobacco: Never Used  . Alcohol Use: 0.0 oz/week    0 Standard drinks or equivalent per week     Comment: occ  . Drug Use: No  . 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 Not on file   Social History Narrative    Outpatient Prescriptions Prior to Visit  Medication Sig Dispense Refill  . hydrochlorothiazide (HYDRODIURIL) 25 MG tablet Take 1 tablet (25 mg total) by mouth daily. 30 tablet 5  . metFORMIN (GLUCOPHAGE-XR) 500 MG 24 hr tablet Take 1,000 mg by mouth 2 (two) times daily.    Marland Kitchen BAYER CONTOUR NEXT TEST test strip     . Cholecalciferol (VITAMIN D PO) Take 1 capsule by mouth daily. Reported on 02/06/2015    . clobetasol ointment (TEMOVATE) [id number] % Apply 1 application topically 2 (two) times daily. (Patient not taking: Reported on 02/06/2015) 30 g 0  . clotrimazole (LOTRIMIN) 1 % external solution Apply 1 application topically 2 (two) times daily. Apply 2-3 drops in left ear  2 times daily. 30 mL 1   No facility-administered medications prior to visit.    No Known Allergies  Review of Systems  Constitutional: Positive for malaise/fatigue. Negative for fever and chills.  HENT: Negative for congestion and hearing loss.   Eyes: Negative for discharge.  Respiratory: Negative for cough, sputum production and shortness of breath.   Cardiovascular: Negative for chest pain, palpitations and leg swelling.  Gastrointestinal: Negative for heartburn, nausea, vomiting, abdominal pain, diarrhea, constipation and blood in stool.  Genitourinary: Negative for dysuria, urgency, frequency and hematuria.  Musculoskeletal: Positive for joint pain. Negative for myalgias, back pain and falls.  Skin: Negative for rash.  Neurological: Negative for  dizziness, sensory change, loss of consciousness, weakness and headaches.  Endo/Heme/Allergies: Negative for environmental allergies. Does not bruise/bleed easily.  Psychiatric/Behavioral: Negative for depression and suicidal ideas. The patient is not nervous/anxious and does not have insomnia.        Objective:    Physical Exam  Constitutional: She is oriented to person, place, and time. She appears well-developed and well-nourished. No distress.  HENT:  Head: Normocephalic and atraumatic.  Eyes: Conjunctivae are normal.  Neck: Normal range of motion. Neck supple. No thyromegaly present.  Cardiovascular: Normal rate, regular rhythm and normal heart sounds.   No murmur heard. Pulmonary/Chest: Effort normal and breath sounds normal. No respiratory distress.  Abdominal: Soft. Bowel sounds are normal. She exhibits no distension and no mass. There is no tenderness.  Musculoskeletal: She exhibits no edema.  Lymphadenopathy:    She has no cervical adenopathy.  Neurological: She is alert and oriented to person, place, and time.  Skin: Skin is warm and dry.  Psychiatric: She has a normal mood and affect. Her behavior is normal.    BP 138/84 mmHg  Pulse 80  Temp(Src) 98.8 F (37.1 C) (Oral)  Ht 5\' 3"  (1.6 m)  Wt 187 lb 8 oz (85.049 kg)  BMI 33.22 kg/m2  SpO2 96%  LMP 02/21/2014 Wt Readings from Last 3 Encounters:  03/04/15 187 lb 8 oz (85.049 kg)  02/06/15 184 lb 14.4 oz (83.87 kg)  01/28/15 187 lb 8 oz (85.049 kg)     Lab Results  Component Value Date   WBC 10.2 03/04/2015   HGB 13.0 03/04/2015   HCT 41.1 03/04/2015   PLT 294.0 03/04/2015   GLUCOSE 94 03/04/2015   CHOL 179 03/04/2015   TRIG 84.0 03/04/2015   HDL 66.40 03/04/2015   LDLDIRECT 128.9 07/14/2006   LDLCALC 96 03/04/2015   ALT 46* 03/04/2015   AST 27 03/04/2015   NA 140 03/04/2015   K 4.3 03/04/2015   CL 101 03/04/2015   CREATININE 0.61 03/04/2015   BUN 16 03/04/2015   CO2 29 03/04/2015   TSH 2.14  03/04/2015   HGBA1C 7.4* 03/04/2015   MICROALBUR 0.7 03/04/2015    Lab Results  Component Value Date   TSH 2.14 03/04/2015   Lab Results  Component Value Date   WBC 10.2 03/04/2015   HGB 13.0 03/04/2015   HCT 41.1 03/04/2015   MCV 92.0 03/04/2015   PLT 294.0 03/04/2015   Lab Results  Component Value Date   NA 140 03/04/2015   K 4.3 03/04/2015   CO2 29 03/04/2015   GLUCOSE 94 03/04/2015   BUN 16 03/04/2015   CREATININE 0.61 03/04/2015   BILITOT 0.2 03/04/2015   ALKPHOS 126* 03/04/2015   AST 27 03/04/2015   ALT 46* 03/04/2015   PROT 7.4 03/04/2015   ALBUMIN 4.1 03/04/2015  CALCIUM 10.1 03/04/2015   ANIONGAP 7 03/06/2014   GFR 107.90 03/04/2015   Lab Results  Component Value Date   CHOL 179 03/04/2015   Lab Results  Component Value Date   HDL 66.40 03/04/2015   Lab Results  Component Value Date   LDLCALC 96 03/04/2015   Lab Results  Component Value Date   TRIG 84.0 03/04/2015   Lab Results  Component Value Date   CHOLHDL 3 03/04/2015   Lab Results  Component Value Date   HGBA1C 7.4* 03/04/2015       Assessment & Plan:   Problem List Items Addressed This Visit    Diabetes ([place])    hgba1c acceptable, minimize simple carbs. Increase exercise as tolerated. Continue current meds, mildly elevated but not enough to warrant changing meds      Relevant Medications   lisinopril (PRINIVIL,ZESTRIL) 5 MG tablet   Other Relevant Orders   Hemoglobin A1c (Completed)   Microalbumin / creatinine urine ratio (Completed)   RESOLVED: Essential (primary) hypertension   Relevant Medications   lisinopril (PRINIVIL,ZESTRIL) 5 MG tablet   Other Relevant Orders   CBC (Completed)   Comprehensive metabolic panel (Completed)   TSH (Completed)   Lipid panel (Completed)   Essential hypertension    Well controlled, no changes to meds. Encouraged heart healthy diet such as the DASH diet and exercise as tolerated.       Relevant Medications   lisinopril  (PRINIVIL,ZESTRIL) 5 MG tablet   External otitis of left ear    Using clotrimazole       Headache    Encouraged increased hydration, 64 ounces of clear fluids daily. Minimize alcohol and caffeine. Eat small frequent meals with lean proteins and complex carbs. Avoid high and low blood sugars. Get adequate sleep, 7-8 hours a night. Needs exercise daily preferably in the morning.      OBESITY    Encouraged DASH diet, decrease po intake and increase exercise as tolerated. Needs 7-8 hours of sleep nightly. Avoid trans fats, eat small, frequent meals every 4-5 hours with lean proteins, complex carbs and healthy fats. Minimize simple carbs      Osteoarthritis    Diffuse joint discomfort occuring over past year. No swelling or redness. Encouraged Krill oil and curcumen caps as well as increased exercise       Other Visit Diagnoses    Pain, joint, hand, unspecified laterality    -  Primary    Relevant Orders    Rheumatoid factor (Completed)       I have discontinued Ms. Blakeney's BAYER CONTOUR NEXT TEST, clobetasol ointment, Cholecalciferol (VITAMIN D PO), and clotrimazole. I am also having her start on lisinopril. Additionally, I am having her maintain her hydrochlorothiazide and metFORMIN.  Meds ordered this encounter  Medications  . lisinopril (PRINIVIL,ZESTRIL) 5 MG tablet    Sig: Take 1 tablet (5 mg total) by mouth daily.    Dispense:  30 tablet    Refill:  [place], MD

## 2015-03-04 NOTE — Assessment & Plan Note (Signed)
Encouraged DASH diet, decrease po intake and increase exercise as tolerated. Needs 7-8 hours of sleep nightly. Avoid trans fats, eat small, frequent meals every 4-5 hours with lean proteins, complex carbs and healthy fats. Minimize simple carbs 

## 2015-03-04 NOTE — Progress Notes (Signed)
Pre visit review using our clinic review tool, if applicable. No additional management support is needed unless otherwise documented below in the visit note. 

## 2015-03-05 LAB — COMPREHENSIVE METABOLIC PANEL
ALT: 46 U/L — AB (ref 0–35)
AST: 27 U/L (ref 0–37)
Albumin: 4.1 g/dL (ref 3.5–5.2)
Alkaline Phosphatase: 126 U/L — ABNORMAL HIGH (ref 39–117)
BILIRUBIN TOTAL: 0.2 mg/dL (ref 0.2–1.2)
BUN: 16 mg/dL (ref 6–23)
CO2: 29 meq/L (ref 19–32)
CREATININE: 0.61 mg/dL (ref 0.40–1.20)
Calcium: 10.1 mg/dL (ref 8.4–10.5)
Chloride: 101 mEq/L (ref 96–112)
GFR: 107.9 mL/min (ref >60.00)
GLUCOSE: 94 mg/dL (ref 70–99)
Potassium: 4.3 mEq/L (ref 3.5–5.1)
Sodium: 140 mEq/L (ref 135–145)
Total Protein: 7.4 g/dL (ref 6.0–8.3)

## 2015-03-05 LAB — CBC
HCT: 41.1 % (ref 36.0–46.0)
Hemoglobin: 13 g/dL (ref 12.0–15.0)
MCHC: 31.5 g/dL (ref 30.0–36.0)
MCV: 92 fl (ref 78.0–100.0)
PLATELETS: 294 10*3/uL (ref 150.0–400.0)
RBC: 4.47 Mil/uL (ref 3.87–5.11)
RDW: 15.1 % (ref 11.5–15.5)
WBC: 10.2 10*3/uL (ref 4.0–10.5)

## 2015-03-05 LAB — LIPID PANEL
CHOL/HDL RATIO: 3
Cholesterol: 179 mg/dL (ref 0–200)
HDL: 66.4 mg/dL (ref >39.00)
LDL CALC: 96 mg/dL (ref 0–99)
NonHDL: 112.31
TRIGLYCERIDES: 84 mg/dL (ref 0.0–149.0)
VLDL: 16.8 mg/dL (ref 0.0–40.0)

## 2015-03-05 LAB — MICROALBUMIN / CREATININE URINE RATIO
CREATININE, U: 33.7 mg/dL
MICROALB/CREAT RATIO: 2.1 mg/g (ref 0.0–30.0)
Microalb, Ur: 0.7 mg/dL (ref 0.0–1.9)

## 2015-03-05 LAB — RHEUMATOID FACTOR

## 2015-03-05 LAB — HEMOGLOBIN A1C: HEMOGLOBIN A1C: 7.4 % — AB (ref 4.6–6.5)

## 2015-03-05 LAB — TSH: TSH: 2.14 u[IU]/mL (ref 0.35–4.50)

## 2015-03-06 ENCOUNTER — Ambulatory Visit: Payer: 59 | Admitting: Internal Medicine

## 2015-03-10 ENCOUNTER — Encounter: Payer: Self-pay | Admitting: Family Medicine

## 2015-03-10 DIAGNOSIS — M1990 Unspecified osteoarthritis, unspecified site: Secondary | ICD-10-CM

## 2015-03-10 HISTORY — DX: Unspecified osteoarthritis, unspecified site: M19.90

## 2015-03-10 NOTE — Assessment & Plan Note (Signed)
Encouraged increased hydration, 64 ounces of clear fluids daily. Minimize alcohol and caffeine. Eat small frequent meals with lean proteins and complex carbs. Avoid high and low blood sugars. Get adequate sleep, 7-8 hours a night. Needs exercise daily preferably in the morning.  

## 2015-03-10 NOTE — Assessment & Plan Note (Signed)
Well controlled, no changes to meds. Encouraged heart healthy diet such as the DASH diet and exercise as tolerated.  °

## 2015-03-10 NOTE — Assessment & Plan Note (Signed)
hgba1c acceptable, minimize simple carbs. Increase exercise as tolerated. Continue current meds, mildly elevated but not enough to warrant changing meds

## 2015-03-10 NOTE — Assessment & Plan Note (Signed)
Using clotrimazole

## 2015-03-10 NOTE — Assessment & Plan Note (Signed)
Diffuse joint discomfort occuring over past year. No swelling or redness. Encouraged Krill oil and curcumen caps as well as increased exercise

## 2015-03-25 ENCOUNTER — Telehealth: Payer: Self-pay | Admitting: Family Medicine

## 2015-03-25 NOTE — Telephone Encounter (Signed)
Patient on schedule to see Dr. Birdie Riddle tomorrow

## 2015-03-25 NOTE — Telephone Encounter (Signed)
[facility] - Client TELEPHONE ADVICE RECORD [facility]  Patient Name: Yolanda [name]  DOB: 09-25-1960    Initial Comment Caller states, excessive coughing and shortness of breath after starting a new Rx    Nurse Assessment  Nurse: Wynetta Emery, RN, Baker Janus Date/Time (Eastern Time): 03/25/2015 10:19:07 AM  Confirm and document reason for call. If symptomatic, describe symptoms. You must click the next button to save text entered. ---[name] was started on lisinopril last Wednesday and has started coughing and shortness of breath feels she coughs so hard that she is going to pass out.  Has the patient traveled out of the country within the last 30 days? ---No  Does the patient have any new or worsening symptoms? ---Yes  Will a triage be completed? ---Yes  Related visit to physician within the last 2 weeks? ---No  Does the PT have any chronic conditions? (i.e. diabetes, asthma, etc.) ---Yes  List chronic conditions. ---HTN  Is this a behavioral health or substance abuse call? ---No     Guidelines    Guideline Title Affirmed Question Affirmed Notes  Cough - Acute Non-Productive [1] Continuous (nonstop) coughing interferes with work or school AND [2] no improvement using cough treatment per protocol    Final Disposition User   See Physician within [place], RN, [place]    Comments  NOTE Newton MD APPT MADE 2/08-2015 945 am With [name] has nothing available -- placed on lisinopril on Wednesday and now having coughin and shortness of breath with this.   Referrals  REFERRED TO PCP OFFICE   Disagree/Comply: Comply

## 2015-03-25 NOTE — Telephone Encounter (Signed)
Pt called in with excessive coughing and SOB since starting new med, transferred to [facility] with [facility].

## 2015-03-26 ENCOUNTER — Encounter: Payer: Self-pay | Admitting: Family Medicine

## 2015-03-26 ENCOUNTER — Ambulatory Visit (INDEPENDENT_AMBULATORY_CARE_PROVIDER_SITE_OTHER): Payer: 59 | Admitting: Family Medicine

## 2015-03-26 VITALS — BP 124/80 | HR 81 | Temp 98.2°F | Ht 63.0 in | Wt 186.6 lb

## 2015-03-26 DIAGNOSIS — E119 Type 2 diabetes mellitus without complications: Secondary | ICD-10-CM | POA: Diagnosis not present

## 2015-03-26 DIAGNOSIS — I10 Essential (primary) hypertension: Secondary | ICD-10-CM

## 2015-03-26 NOTE — Patient Instructions (Signed)
Follow up as scheduled w/ Dr Charlett Blake STOP the lisinopril due to cough Continue the HCTZ daily Call with any questions or concerns Hang in there!!!

## 2015-03-26 NOTE — Progress Notes (Signed)
   Subjective:    Patient ID: Yolanda [name], female    DOB: 11/16/1960, 55 y.o.   MRN: [id number]  HPI Medication reaction- pt reports she started Lisinopril 6 days ago and within 2 days developed dry cough, some wheezing.  Stopped medication on Sunday.  Cough is improving since stopping Lisinopril.  Took left over tessalon this AM.  Pt was started on Lisinopril to protect her kidneys in the setting of DM.     Review of Systems For ROS see HPI     Objective:   Physical Exam  Constitutional: She is oriented to person, place, and time. She appears well-developed and well-nourished. No distress.  HENT:  Head: Normocephalic and atraumatic.  Eyes: Conjunctivae and EOM are normal. Pupils are equal, round, and reactive to light.  Neck: Normal range of motion. Neck supple.  Cardiovascular: Normal rate, regular rhythm, normal heart sounds and intact distal pulses.   No murmur heard. Pulmonary/Chest: Effort normal and breath sounds normal. No respiratory distress.  Musculoskeletal: She exhibits no edema.  Neurological: She is alert and oriented to person, place, and time.  Skin: Skin is warm and dry.  Psychiatric: She has a normal mood and affect. Her behavior is normal.  Vitals reviewed.         Assessment & Plan:

## 2015-03-26 NOTE — Assessment & Plan Note (Signed)
Pt was placed on ACE for renal protection but developed cough almost immediately w/ Lisinopril.  Discussed switching to ARB but pt is not interested at this time.  Pt prefers to f/u w/ PCP in April to discuss next steps.  Reviewed recent Microalbumin w/ pt and feel it is appropriate to wait at this time since labs were normal.

## 2015-03-26 NOTE — Assessment & Plan Note (Signed)
Adequate control w/o ACE.  Pt to continue HCTZ and f/u w/ PCP as scheduled.

## 2015-03-26 NOTE — Progress Notes (Signed)
Pre visit review using our clinic review tool, if applicable. No additional management support is needed unless otherwise documented below in the visit note. 

## 2015-05-21 ENCOUNTER — Encounter: Payer: Self-pay | Admitting: Obstetrics & Gynecology

## 2015-05-21 ENCOUNTER — Ambulatory Visit (INDEPENDENT_AMBULATORY_CARE_PROVIDER_SITE_OTHER): Payer: 59 | Admitting: Obstetrics & Gynecology

## 2015-05-21 VITALS — BP 110/76 | HR 96 | Temp 98.2°F | Resp 16 | Ht 63.0 in | Wt 189.0 lb

## 2015-05-21 DIAGNOSIS — R102 Pelvic and perineal pain: Secondary | ICD-10-CM

## 2015-05-21 DIAGNOSIS — N764 Abscess of vulva: Secondary | ICD-10-CM | POA: Diagnosis not present

## 2015-05-21 LAB — POCT URINALYSIS DIPSTICK
Bilirubin, UA: NEGATIVE
Glucose, UA: NEGATIVE
Ketones, UA: NEGATIVE
LEUKOCYTES UA: NEGATIVE
NITRITE UA: NEGATIVE
PH UA: 7
PROTEIN UA: NEGATIVE
RBC UA: NEGATIVE
Urobilinogen, UA: NEGATIVE

## 2015-05-21 MED ORDER — SULFAMETHOXAZOLE-TRIMETHOPRIM 800-160 MG PO TABS: 1.0000 | ORAL_TABLET | Freq: Two times a day (BID) | ORAL | Status: DC

## 2015-05-24 LAB — WOUND CULTURE

## 2015-05-27 ENCOUNTER — Encounter: Payer: Self-pay | Admitting: Obstetrics & Gynecology

## 2015-05-27 ENCOUNTER — Ambulatory Visit (INDEPENDENT_AMBULATORY_CARE_PROVIDER_SITE_OTHER): Payer: 59 | Admitting: Obstetrics & Gynecology

## 2015-05-27 VITALS — BP 126/80 | HR 80 | Resp 16 | Ht 63.0 in | Wt 187.0 lb

## 2015-05-27 DIAGNOSIS — N764 Abscess of vulva: Secondary | ICD-10-CM

## 2015-05-27 NOTE — Progress Notes (Signed)
Subjective:     Patient ID: Yolanda [name], female   DOB: 1960-06-16, 55 y.o.   MRN: [id number]  HPI 55 yo G11 M AA Female here for follow up after having I&D of left labial abscess on 05/21/15.  Skin culture grew nothing specific.  No MRSA was present.  Pt has about 24 hours of antibiotics left.  Reports no drainage.  Has looked with a mirror and thinks she still may see some "pus".  No fevers.  Feels the area is softer and has no tenderness.  Culture reviewed with pt.  Review of Systems  All other systems reviewed and are negative.      Objective:   Physical Exam  Constitutional: She appears well-developed and well-nourished.  Genitourinary:     Skin: Skin is warm and dry.  Psychiatric: She has a normal mood and affect.       Assessment:     Left labial abscess, healing well.  Culture with no specific bacteria.  Diabetes, on oral medication.  Last HbA1C was 7.4 on 03/04/15    Plan:     Pt will finish antibiotics.  Topical neosporin or other antibiotic ointment can be used.  No additional follow up needed unless lesion starts to worsen. Pt has AEX scheduled for the summer and will return for this.

## 2015-05-27 NOTE — Progress Notes (Signed)
Subjective:     Patient ID: SHONDA LIKENS, female   DOB: [birth year], 55 y.o.   MRN: [id number]  HPI 55 yo G6 M AA female here for complaint of left vulvar "mass" that she noticed two or three days ago, starting over the weekend.  Area is mildly tender but seems to be enlarging.  Denies drainage.  Denies fever.  Has not tried to use anything on the lesion.    H/O diabetes with last HbA1C of 7.4 in January, 2017.  On oral metformin for this.    Review of Systems  All other systems reviewed and are negative.      Objective:   Physical Exam  Constitutional: She is oriented to person, place, and time. She appears well-developed and well-nourished.  Abdominal: Soft. Bowel sounds are normal.  Genitourinary: Vagina normal.    There is no rash, tenderness, lesion or injury on the right labia. There is tenderness (mild) and lesion on the left labia.  Lymphadenopathy:       Right: No inguinal adenopathy present.       Left: No inguinal adenopathy present.  Neurological: She is alert and oriented to person, place, and time.  Skin: Skin is warm and dry.  Psychiatric: She has a normal mood and affect.   Procedure:  Due to size and erythema, drainage recommended.  Verbal consent obtained.  Lesion cleansed with Betadine x 3.  Lidocaine 1% used to anesthestize the skin where I&D will be done.  0.8cc used.  Lot: [id number].  Exp: 04/16/16.  Using #11 blade, lesion opened with purulent drainage noted.  Lesion proved with sterile Q tip.  No loculations noted.  Culture was obtained.  Lesion not deep enough for packing.  Pt tolerated procedure well.    Assessment:     Left labial abscess, s/p I&D today Diabetes     Plan:     Culture pending Bactrim DS BID x 7 days Hot compresses twice daily and tub bath with Epson salt daily recommended.  Follow-up on Friday, if possible.   Pt given precautions for returning with worsening symptoms.  Voices understanding.

## 2015-06-04 ENCOUNTER — Ambulatory Visit: Payer: 59 | Admitting: Family Medicine

## 2015-06-10 ENCOUNTER — Encounter: Payer: Self-pay | Admitting: Family Medicine

## 2015-06-10 ENCOUNTER — Ambulatory Visit (INDEPENDENT_AMBULATORY_CARE_PROVIDER_SITE_OTHER): Payer: 59 | Admitting: Family Medicine

## 2015-06-10 VITALS — BP 122/82 | HR 90 | Temp 98.0°F | Ht 63.0 in | Wt 190.1 lb

## 2015-06-10 DIAGNOSIS — Z23 Encounter for immunization: Secondary | ICD-10-CM

## 2015-06-10 DIAGNOSIS — Z1159 Encounter for screening for other viral diseases: Secondary | ICD-10-CM

## 2015-06-10 DIAGNOSIS — M8949 Other hypertrophic osteoarthropathy, multiple sites: Secondary | ICD-10-CM

## 2015-06-10 DIAGNOSIS — R51 Headache: Secondary | ICD-10-CM | POA: Diagnosis not present

## 2015-06-10 DIAGNOSIS — Z0289 Encounter for other administrative examinations: Secondary | ICD-10-CM

## 2015-06-10 DIAGNOSIS — M150 Primary generalized (osteo)arthritis: Secondary | ICD-10-CM

## 2015-06-10 DIAGNOSIS — R519 Headache, unspecified: Secondary | ICD-10-CM

## 2015-06-10 DIAGNOSIS — M159 Polyosteoarthritis, unspecified: Secondary | ICD-10-CM

## 2015-06-10 DIAGNOSIS — I10 Essential (primary) hypertension: Secondary | ICD-10-CM | POA: Diagnosis not present

## 2015-06-10 DIAGNOSIS — E119 Type 2 diabetes mellitus without complications: Secondary | ICD-10-CM

## 2015-06-10 DIAGNOSIS — E669 Obesity, unspecified: Secondary | ICD-10-CM

## 2015-06-10 NOTE — Progress Notes (Signed)
Pre visit review using our clinic review tool, if applicable. No additional management support is needed unless otherwise documented below in the visit note. 

## 2015-06-10 NOTE — Progress Notes (Signed)
Subjective:    Patient ID: Yolanda [name], female    DOB: 09-Oct-1960, 55 y.o.   MRN: [id number]  Chief Complaint  Patient presents with  . Follow-up    HPI Patient is in today for follow up. Patient reports her blood sugars have been running around 120- 130's.  Patient reports she still has been having joint pain in hands but manages the pain. No polyuria or polydipsia. No recent injury or hospitalization. Denies CP/palp/SOB/HA/congestion/fevers/GI or GU c/o. Taking meds as prescribed  Past Medical History  Diagnosis Date  . Diabetes mellitus without complication ([place])     type II  . Fibroid uterus   . Refusal of blood transfusions as patient is Jehovah's Witness     NO BLOOD PRODUCTS  . Hypertension   . Sleep apnea   . Asthma     with respiratory infection  . PONV (postoperative nausea and vomiting)     pt stopped breathing during colonoscopy  . Osteoarthritis 03/10/2015    Past Surgical History  Procedure Laterality Date  . Cholecystectomy  1/11  . Colonoscopy    . Dilatation & currettage/hysteroscopy with resectocope N/A 03/13/2014    Procedure: DILATATION & CURETTAGE/HYSTEROSCOPY ;  Surgeon: Lyman Speller, MD;  Location: [facility];  Service: Gynecology;  Laterality: N/A;    Family History  Problem Relation Age of Onset  . Asthma Mother   . Diabetes Mother   . Diabetes Father   . Heart attack Father   . Diabetes Brother   . Diabetes Brother   . Asthma Brother   . Heart Problems Sister   . Asthma Sister   . Hypertension Sister   . Osteopenia Sister   . Heart disease Maternal Grandfather     Social History   Social History  . Marital Status: Married    Spouse Name: N/A  . Number of Children: N/A  . Years of Education: N/A   Occupational History  . Not on file.   Social History Main Topics  . Smoking status: Never Smoker   . Smokeless tobacco: Never Used  . Alcohol Use: 0.0 oz/week    0 Standard drinks or equivalent per week     Comment: occ    . Drug Use: No  . 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 Not on file   Social History Narrative    Outpatient Prescriptions Prior to Visit  Medication Sig Dispense Refill  . hydrochlorothiazide (HYDRODIURIL) 25 MG tablet Take 1 tablet (25 mg total) by mouth daily. 30 tablet 5  . metFORMIN (GLUCOPHAGE-XR) 500 MG 24 hr tablet Take 1,000 mg by mouth 2 (two) times daily.    Marland Kitchen sulfamethoxazole-trimethoprim (BACTRIM DS) 800-160 MG tablet Take 1 tablet by mouth 2 (two) times daily. 14 tablet 0   No facility-administered medications prior to visit.    Allergies  Allergen Reactions  . Lisinopril Cough    With SOB and wheezing    Review of Systems  Constitutional: Negative for fever and malaise/fatigue.  HENT: Negative for congestion.   Eyes: Negative for blurred vision.  Respiratory: Negative for shortness of breath.   Cardiovascular: Negative for chest pain, palpitations and leg swelling.  Gastrointestinal: Negative for nausea, abdominal pain and blood in stool.  Genitourinary: Negative for dysuria and frequency.  Musculoskeletal: Positive for joint pain (in hands). Negative for falls.  Skin: Negative for rash.  Neurological: Negative for dizziness, loss of consciousness and headaches.  Endo/Heme/Allergies: Negative for environmental allergies.  Psychiatric/Behavioral: Negative for depression. The patient is not nervous/anxious.        Objective:    Physical Exam  Constitutional: She is oriented to person, place, and time. She appears well-developed and well-nourished. No distress.  HENT:  Head: Normocephalic and atraumatic.  Eyes: Conjunctivae are normal.  Neck: Neck supple. No thyromegaly present.  Cardiovascular: Normal rate, regular rhythm and normal heart sounds.   No murmur  heard. Pulmonary/Chest: Effort normal and breath sounds normal. No respiratory distress.  Abdominal: Soft. Bowel sounds are normal. She exhibits no distension and no mass. There is no tenderness.  Musculoskeletal: She exhibits no edema.  Lymphadenopathy:    She has no cervical adenopathy.  Neurological: She is alert and oriented to person, place, and time.  Skin: Skin is warm and dry.  Psychiatric: She has a normal mood and affect. Her behavior is normal.    BP 122/82 mmHg  Pulse 90  Temp(Src) 98 F (36.7 C) (Oral)  Ht 5\' 3"  (1.6 m)  Wt 190 lb 2 oz (86.24 kg)  BMI 33.69 kg/m2  SpO2 97%  LMP 02/21/2014 Wt Readings from Last 3 Encounters:  06/10/15 190 lb 2 oz (86.24 kg)  05/27/15 187 lb (84.823 kg)  05/21/15 189 lb (85.73 kg)     Lab Results  Component Value Date   WBC 10.2 03/04/2015   HGB 13.0 03/04/2015   HCT 41.1 03/04/2015   PLT 294.0 03/04/2015   GLUCOSE 94 03/04/2015   CHOL 179 03/04/2015   TRIG 84.0 03/04/2015   HDL 66.40 03/04/2015   LDLDIRECT 128.9 07/14/2006   LDLCALC 96 03/04/2015   ALT 46* 03/04/2015   AST 27 03/04/2015   NA 140 03/04/2015   K 4.3 03/04/2015   CL 101 03/04/2015   CREATININE 0.61 03/04/2015   BUN 16 03/04/2015   CO2 29 03/04/2015   TSH 2.14 03/04/2015   HGBA1C 7.4* 03/04/2015   MICROALBUR 0.7 03/04/2015    Lab Results  Component Value Date   TSH 2.14 03/04/2015   Lab Results  Component Value Date   WBC 10.2 03/04/2015   HGB 13.0 03/04/2015   HCT 41.1 03/04/2015   MCV 92.0 03/04/2015   PLT 294.0 03/04/2015   Lab Results  Component Value Date   NA 140 03/04/2015   K 4.3 03/04/2015   CO2 29 03/04/2015   GLUCOSE 94 03/04/2015   BUN 16 03/04/2015   CREATININE 0.61 03/04/2015   BILITOT 0.2 03/04/2015   ALKPHOS 126* 03/04/2015   AST 27 03/04/2015   ALT 46* 03/04/2015   PROT 7.4 03/04/2015   ALBUMIN 4.1 03/04/2015   CALCIUM 10.1 03/04/2015   ANIONGAP 7 03/06/2014   GFR 107.90 03/04/2015   Lab Results  Component  Value Date   CHOL 179 03/04/2015   Lab Results  Component Value Date   HDL 66.40 03/04/2015   Lab Results  Component Value Date   LDLCALC 96 03/04/2015   Lab Results  Component Value Date   TRIG 84.0 03/04/2015   Lab Results  Component Value Date   CHOLHDL 3 03/04/2015   Lab Results  Component Value Date   HGBA1C 7.4* 03/04/2015       Assessment & Plan:   Problem List Items Addressed This Visit    None      I have  discontinued Ms. Belizaire's sulfamethoxazole-trimethoprim. I am also having her maintain her hydrochlorothiazide and metFORMIN.  No orders of the defined types were placed in this encounter.     Jan Fireman, [place]

## 2015-06-10 NOTE — Patient Instructions (Addendum)
NOW Probiotic Vitamin. Available at [employer]. Basic Carbohydrate Counting for Diabetes Mellitus Carbohydrate counting is a method for keeping track of the amount of carbohydrates you eat. Eating carbohydrates naturally increases the level of sugar (glucose) in your blood, so it is important for you to know the amount that is okay for you to have in every meal. Carbohydrate counting helps keep the level of glucose in your blood within normal limits. The amount of carbohydrates allowed is different for every person. A dietitian can help you calculate the amount that is right for you. Once you know the amount of carbohydrates you can have, you can count the carbohydrates in the foods you want to eat. Carbohydrates are found in the following foods:  Grains, such as breads and cereals.  Dried beans and soy products.  Starchy vegetables, such as potatoes, peas, and corn.  Fruit and fruit juices.  Milk and yogurt.  Sweets and snack foods, such as cake, cookies, candy, chips, soft drinks, and fruit drinks. CARBOHYDRATE COUNTING There are two ways to count the carbohydrates in your food. You can use either of the methods or a combination of both. Reading the "Nutrition Facts" on [place] The "Nutrition Facts" is an area that is included on the labels of almost all packaged food and beverages in the [place]. It includes the serving size of that food or beverage and information about the nutrients in each serving of the food, including the grams (g) of carbohydrate per serving.  Decide the number of servings of this food or beverage that you will be able to eat or drink. Multiply that number of servings by the number of grams of carbohydrate that is listed on the label for that serving. The total will be the amount of carbohydrates you will be having when you eat or drink this food or beverage. Learning Standard Serving Sizes of Food When you eat food that is not packaged or does not  include "Nutrition Facts" on the label, you need to measure the servings in order to count the amount of carbohydrates.A serving of most carbohydrate-rich foods contains about 15 g of carbohydrates. The following list includes serving sizes of carbohydrate-rich foods that provide 15 g ofcarbohydrate per serving:   1 slice of bread (1 oz) or 1 six-inch tortilla.    of a hamburger bun or English muffin.  4-6 crackers.   cup unsweetened dry cereal.    cup hot cereal.   cup rice or pasta.    cup mashed potatoes or  of a large baked potato.  1 cup fresh fruit or one small piece of fruit.    cup canned or frozen fruit or fruit juice.  1 cup milk.   cup plain fat-free yogurt or yogurt sweetened with artificial sweeteners.   cup cooked dried beans or starchy vegetable, such as peas, corn, or potatoes.  Decide the number of standard-size servings that you will eat. Multiply that number of servings by 15 (the grams of carbohydrates in that serving). For example, if you eat 2 cups of strawberries, you will have eaten 2 servings and 30 g of carbohydrates (2 servings x 15 g = 30 g). For foods such as soups and casseroles, in which more than one food is mixed in, you will need to count the carbohydrates in each food that is included. EXAMPLE OF CARBOHYDRATE COUNTING Sample Dinner  3 oz chicken breast.   cup of brown rice.   cup of corn.  1 cup  milk.   1 cup strawberries with sugar-free whipped topping.  Carbohydrate Calculation Step 1: Identify the foods that contain carbohydrates:   Rice.   Corn.   Milk.   Strawberries. Step 2:Calculate the number of servings eaten of each:   2 servings of rice.   1 serving of corn.   1 serving of milk.   1 serving of strawberries. Step 3: Multiply each of those number of servings by 15 g:   2 servings of rice x 15 g = 30 g.   1 serving of corn x 15 g = 15 g.   1 serving of milk x 15 g = 15 g.   1  serving of strawberries x 15 g = 15 g. Step 4: Add together all of the amounts to find the total grams of carbohydrates eaten: 30 g + 15 g + 15 g + 15 g = 75 g.   This information is not intended to replace advice given to you by your health care provider. Make sure you discuss any questions you have with your health care provider.   Document Released: 02/02/2005 Document Revised: 02/23/2014 Document Reviewed: 12/30/2012 Elsevier Interactive Patient Education [employer].

## 2015-06-12 DIAGNOSIS — E785 Hyperlipidemia, unspecified: Secondary | ICD-10-CM | POA: Insufficient documentation

## 2015-06-24 NOTE — Assessment & Plan Note (Signed)
Encouraged increased hydration, 64 ounces of clear fluids daily. Minimize alcohol and caffeine. Eat small frequent meals with lean proteins and complex carbs. Avoid high and low blood sugars. Get adequate sleep, 7-8 hours a night. Needs exercise daily preferably in the morning.  

## 2015-06-24 NOTE — Assessment & Plan Note (Signed)
Well controlled, no changes to meds. Encouraged heart healthy diet such as the DASH diet and exercise as tolerated.  °

## 2015-06-24 NOTE — Assessment & Plan Note (Signed)
Encouraged DASH diet, decrease po intake and increase exercise as tolerated. Needs 7-8 hours of sleep nightly. Avoid trans fats, eat small, frequent meals every 4-5 hours with lean proteins, complex carbs and healthy fats. Minimize simple carbs 

## 2015-06-24 NOTE — Assessment & Plan Note (Signed)
Encouraged increased exercise and topical treatments, heart healthy diet

## 2015-08-28 ENCOUNTER — Telehealth: Payer: Self-pay | Admitting: Family Medicine

## 2015-08-28 NOTE — Telephone Encounter (Signed)
Patient was charged a No Show Fee for 06/10/2015 but patient was actually seen on that day and made a follow up appointment after being seen.

## 2015-08-30 ENCOUNTER — Other Ambulatory Visit: Payer: Self-pay | Admitting: Family Medicine

## 2015-08-30 DIAGNOSIS — Z1231 Encounter for screening mammogram for malignant neoplasm of breast: Secondary | ICD-10-CM

## 2015-09-02 ENCOUNTER — Other Ambulatory Visit: Payer: 59

## 2015-09-09 ENCOUNTER — Ambulatory Visit: Payer: 59 | Admitting: Family Medicine

## 2015-09-09 NOTE — Telephone Encounter (Signed)
Left patient a message that fee was waived

## 2015-09-30 ENCOUNTER — Ambulatory Visit (HOSPITAL_BASED_OUTPATIENT_CLINIC_OR_DEPARTMENT_OTHER)
Admission: RE | Admit: 2015-09-30 | Discharge: 2015-09-30 | Disposition: A | Discharge status: Home / Self Care | Payer: 59 | Source: Ambulatory Visit | Attending: Family Medicine | Admitting: Family Medicine

## 2015-09-30 DIAGNOSIS — Z1231 Encounter for screening mammogram for malignant neoplasm of breast: Secondary | ICD-10-CM | POA: Insufficient documentation

## 2015-12-06 ENCOUNTER — Ambulatory Visit (INDEPENDENT_AMBULATORY_CARE_PROVIDER_SITE_OTHER): Payer: 59 | Admitting: Obstetrics & Gynecology

## 2015-12-06 ENCOUNTER — Encounter: Payer: Self-pay | Admitting: Obstetrics & Gynecology

## 2015-12-06 VITALS — BP 128/80 | HR 86 | Resp 16 | Ht 62.75 in | Wt 189.0 lb

## 2015-12-06 DIAGNOSIS — F3289 Other specified depressive episodes: Secondary | ICD-10-CM | POA: Diagnosis not present

## 2015-12-06 DIAGNOSIS — Z0000 Encounter for general adult medical examination without abnormal findings: Secondary | ICD-10-CM

## 2015-12-06 DIAGNOSIS — R4589 Other symptoms and signs involving emotional state: Secondary | ICD-10-CM

## 2015-12-06 DIAGNOSIS — Z205 Contact with and (suspected) exposure to viral hepatitis: Secondary | ICD-10-CM

## 2015-12-06 DIAGNOSIS — N951 Menopausal and female climacteric states: Secondary | ICD-10-CM | POA: Diagnosis not present

## 2015-12-06 DIAGNOSIS — Z01419 Encounter for gynecological examination (general) (routine) without abnormal findings: Secondary | ICD-10-CM | POA: Diagnosis not present

## 2015-12-06 DIAGNOSIS — F329 Major depressive disorder, single episode, unspecified: Secondary | ICD-10-CM

## 2015-12-06 LAB — CBC
HEMATOCRIT: 39.5 % (ref 35.0–45.0)
Hemoglobin: 12.7 g/dL (ref 11.7–15.5)
MCH: 29.1 pg (ref 27.0–33.0)
MCHC: 32.2 g/dL (ref 32.0–36.0)
MCV: 90.4 fL (ref 80.0–100.0)
MPV: 10.6 fL (ref 7.5–12.5)
Platelets: 331 10*3/uL (ref 140–400)
RBC: 4.37 MIL/uL (ref 3.80–5.10)
RDW: 14.3 % (ref 11.0–15.0)
WBC: 8.5 10*3/uL (ref 3.8–10.8)

## 2015-12-06 LAB — TSH: TSH: 2.37 mIU/L

## 2015-12-06 NOTE — Patient Instructions (Signed)
Dr. Crissie Reese [phone] [address] [place], [place] [address]

## 2015-12-06 NOTE — Progress Notes (Signed)
56 y.o. G0P0 MarriedAfrican AmericanF here for annual exam.  Denies vaginal bleeding.  Does need some blood work today.  Reports she is feeling some change in mood.  Feels like she is "fighting depression".  She wonders if this is related to menopause.  Denies homicidal or suicidal ideations.  States "it's nothing like that".  Not sure she wants medication but would like to know if menopause is contributing.  Also, she states husband reports pt's "vagina is dry".  Doesn't feel like she gets lubricated well with intercourse.  Denies pain.  Patient's last menstrual period was 02/21/2014.          Sexually active: Yes.    The current method of family planning is post menopausal status.    Exercising: Yes.    Elliptical  Smoker:  no  Health Maintenance: Pap:  09/07/14 Neg. 08/01/12 Neg. HR HPV:neg  History of abnormal Pap:  no MMG:  10/01/15 BIRADS1:neg  Colonoscopy:  11/02/12 Polyp. F/u 5 years  BMD:   09/05/13 Normal  TDaP:  07/2006  Pneumonia vaccine(s):  05/2015 Zostavax:   No Hep C testing: will do today Screening Labs: PCP, Urine today: PCP   reports that she has never smoked. She has never used smokeless tobacco. She reports that she drinks alcohol. She reports that she does not use drugs.  Past Medical History:  Diagnosis Date  . Asthma    with respiratory infection  . Diabetes mellitus without complication ([place])    type II  . Fibroid uterus   . Hypertension   . Osteoarthritis 03/10/2015  . PONV (postoperative nausea and vomiting)    pt stopped breathing during colonoscopy  . Refusal of blood transfusions as patient is Jehovah's Witness    NO BLOOD PRODUCTS  . Sleep apnea     Past Surgical History:  Procedure Laterality Date  . CHOLECYSTECTOMY  1/11  . COLONOSCOPY    . DILATATION & CURRETTAGE/HYSTEROSCOPY WITH RESECTOCOPE N/A 03/13/2014   Procedure: DILATATION & CURETTAGE/HYSTEROSCOPY ;  Surgeon: Lyman Speller, MD;  Location: [facility];  Service: Gynecology;  Laterality: N/A;     Current Outpatient Prescriptions  Medication Sig Dispense Refill  . albuterol (PROVENTIL HFA;VENTOLIN HFA) 108 (90 Base) MCG/ACT inhaler Inhale into the lungs as needed.    . hydrochlorothiazide (HYDRODIURIL) 25 MG tablet Take 1 tablet (25 mg total) by mouth daily. 30 tablet 5  . metFORMIN (GLUCOPHAGE-XR) 500 MG 24 hr tablet Take 1,000 mg by mouth 2 (two) times daily.    . Pediatric Multivitamins-Fl (MULTI VIT/FL PO) Take by mouth daily.     No current facility-administered medications for this visit.     Family History  Problem Relation Age of Onset  . Asthma Mother   . Diabetes Mother   . Diabetes Father   . Heart attack Father   . Diabetes Brother   . Diabetes Brother   . Asthma Brother   . Heart Problems Sister   . Asthma Sister   . Hypertension Sister   . Heart disease Maternal Grandfather   . Osteopenia Sister     ROS:  Pertinent items are noted in HPI.  Otherwise, a comprehensive ROS was negative.  Exam:   BP 128/80 (BP Location: Right Arm, Patient Position: Sitting, Cuff Size: Large)   Pulse 86   Resp 16   Ht 5' 2.75" (1.594 m)   Wt 189 lb (85.7 kg)   LMP 02/21/2014 Comment: postmenopausal bleeding  BMI 33.75 kg/m   Weight change: +8#  Height: 5' 2.75" (159.4 cm)  Ht Readings from Last 3 Encounters:  12/06/15 5' 2.75" (1.594 m)  06/10/15 5\' 3"  (1.6 m)  05/27/15 5\' 3"  (1.6 m)    General appearance: alert, cooperative and appears stated age Head: Normocephalic, without obvious abnormality, atraumatic Neck: no adenopathy, supple, symmetrical, trachea midline and thyroid normal to inspection and palpation Lungs: clear to auscultation bilaterally Breasts: normal appearance, no masses or tenderness Heart: regular rate and rhythm Abdomen: soft, non-tender; bowel sounds normal; no masses,  no organomegaly Extremities: extremities normal, atraumatic, no cyanosis or edema Skin: Skin color, texture, turgor normal. No rashes or lesions Lymph nodes: Cervical,  supraclavicular, and axillary nodes normal. No abnormal inguinal nodes palpated Neurologic: Grossly normal  Pelvic: External genitalia:  no lesions              Urethra:  normal appearing urethra with no masses, tenderness or lesions              Bartholins and Skenes: normal                 Vagina: normal appearing vagina with normal color and discharge, no lesions              Cervix: no lesions              Pap taken: No. Bimanual Exam:  Uterus:  normal size, contour, position, consistency, mobility, non-tender              Adnexa: normal adnexa and no mass, fullness, tenderness               Rectovaginal: Confirms               Anus:  normal sphincter tone, no lesions  Chaperone was present for exam.  A:  Well Woman with normal exam H/O fibroids, smaller on exam today Diabetes Hypertension H/O hysteroscopy with polyp resection 1/16 Large pendulous breasts Jehovah's witness  P: Mammogram yearly. Has appt scheduled for next week. Names given for breast reduction. No pap smear obtained today.  Neg pap 2016.  Pap smear with HR HPV 2014 TSH, CBC, Hep C antibody obtained today FSH and estradiol obtained today.  She may need a follow up appt to discuss HRT options vs antidepressants considering symptoms. AEX 1 year

## 2015-12-07 LAB — ESTRADIOL

## 2015-12-07 LAB — HEPATITIS C ANTIBODY: HCV AB: NEGATIVE

## 2015-12-07 LAB — FOLLICLE STIMULATING HORMONE: FSH: 71.6 m[IU]/mL

## 2015-12-11 ENCOUNTER — Telehealth: Payer: Self-pay | Admitting: *Deleted

## 2015-12-11 NOTE — Telephone Encounter (Signed)
Left message to call Sharee Pimple at [phone].    Notes Recorded by Megan Salon, MD on 12/11/2015 at 8:08 AM EDT Please let pt know her estradiol was very low and her [facility] is clearly in menopausal range. This has changed since the last time this was tested which may explain her symptoms. She is considering HRT. If she desires this, probably should return to discuss options and risks.

## 2015-12-11 NOTE — Telephone Encounter (Signed)
Spoke with patient, advised of lab results and recommendations as seen below, per Dr. Sabra Heck. Patient scheduled for 12/12/15 at 3:00pm to discuss HRT.  Patient verbalizes understanding and is agreeable.  Routing to provider for final review. Patient is agreeable to disposition. Will close encounter.

## 2015-12-11 NOTE — Telephone Encounter (Signed)
Patient is returning a call to Jill. °

## 2015-12-12 ENCOUNTER — Encounter: Payer: Self-pay | Admitting: Obstetrics & Gynecology

## 2015-12-12 ENCOUNTER — Other Ambulatory Visit: Payer: Self-pay | Admitting: Obstetrics & Gynecology

## 2015-12-12 ENCOUNTER — Ambulatory Visit (INDEPENDENT_AMBULATORY_CARE_PROVIDER_SITE_OTHER): Payer: 59 | Admitting: Obstetrics & Gynecology

## 2015-12-12 VITALS — BP 118/78 | HR 84 | Ht 63.0 in | Wt 190.0 lb

## 2015-12-12 DIAGNOSIS — N951 Menopausal and female climacteric states: Secondary | ICD-10-CM | POA: Diagnosis not present

## 2015-12-12 DIAGNOSIS — F3289 Other specified depressive episodes: Secondary | ICD-10-CM

## 2015-12-12 DIAGNOSIS — R4589 Other symptoms and signs involving emotional state: Secondary | ICD-10-CM

## 2015-12-12 DIAGNOSIS — F329 Major depressive disorder, single episode, unspecified: Secondary | ICD-10-CM

## 2015-12-12 MED ORDER — FLUOXETINE HCL 10 MG PO TABS: ORAL_TABLET | ORAL | 1 refills | Status: DC

## 2015-12-12 NOTE — Progress Notes (Signed)
55 y.o.  G77P0 Married female here to discuss intitation of HRT versus other options.  Pt was seen for AEX on 12/06/15.  At that time, she described increased symptoms of depression that she felt were related to other menopausal symptoms.  Her last bleeding episode was 1/16.  Since that time, she reports increased hot flashes which are very manageable.  Her husband has noted some increased vaginal dryness.  Pt denies pain with intercourse.  From mood standpoint, she denies suicidal or homocidal ideation.  As she feels very "hormonal" to her, she wants to discuss HRT.  Pt has known hx of fibroids and hx of menorrhagia in the past.  Pt also has hypertension and diabetes controlled with oral agent.  Discussed with patient risks and benefits and specifically the WHI study including but not limited to risks of increased risks of heart disease, MI, stroke, DVT, and breast cancer.  Increased risks of gall bladder disease and change in cholesterol panels also discussed.  Possibility of bleeding was discussed as patient does not have a uterus and fibroids.  Benefits of improved quality of life, improved bone density and decreased risks of colon cancer also discussed.   We then discussed just treatment of depression symptoms.  SSRI and SNRI class of medications discussed.  Side effects reviewed.  Questions answered.  Pt wants to know "my opinion".  Due to fibroids and risk of bleeding, I would prefer trial of SSRI first.  Pt comfortable with this.    Past Medical History:  Diagnosis Date  . Asthma    with respiratory infection  . Diabetes mellitus without complication ([place])    type II  . Fibroid uterus   . Hypertension   . Osteoarthritis 03/10/2015  . PONV (postoperative nausea and vomiting)    pt stopped breathing during colonoscopy  . Refusal of blood transfusions as patient is Jehovah's Witness    NO BLOOD PRODUCTS  . Sleep apnea     Recent lab work:  [facility]: 71.6 (12/05/15).  Prior was 41 about a year  ago        Assessment:  Symptomatic menopausal symptoms with some depression symptoms  Plan:  Will start with Prozac 10mg  daily.  Side effects reviewed.  After two weeks, pt will increase to 20mg  daily.  Pt is going to return for recheck or call with update in 1 month.   ~20 minutes spent with patient >50% of time was in face to face discussion of above.

## 2015-12-12 NOTE — Patient Instructions (Signed)
Replens vaginal moisturizer.  Coconut oil twice weekly.  Prozac 10mg  daily for 14 days.  Then increase to 20mg .  Give an update in month.

## 2015-12-13 NOTE — Telephone Encounter (Signed)
Medication refill request: Prozac  Last AEX:  12/06/15 SM Next AEX: 03/19/17 SM  Last MMG (if hormonal medication request): 10/01/15 BIRADS1:neg  Refill authorized: 12/12/15 #[id number] / 1 R. Patient requesting 90 days supply.   Please advise.

## 2016-01-08 ENCOUNTER — Encounter: Payer: Self-pay | Admitting: Obstetrics & Gynecology

## 2016-01-10 ENCOUNTER — Other Ambulatory Visit: Payer: Self-pay | Admitting: Obstetrics & Gynecology

## 2016-04-12 ENCOUNTER — Other Ambulatory Visit: Payer: Self-pay | Admitting: Obstetrics & Gynecology

## 2016-04-13 MED ORDER — FLUOXETINE HCL 10 MG PO TABS: 10.0000 mg | ORAL_TABLET | Freq: Every day | ORAL | 0 refills | Status: DC

## 2016-04-13 NOTE — Telephone Encounter (Signed)
Medication refill request: prozac  Last AEX:  12/06/15 SM Next AEX: 03/19/17 SM  Last MMG (if hormonal medication request): 09/30/15 BIRADS1:neg  Refill authorized: 12/13/15 #[id number]. Today please advise.   Routed to Dr. Quincy Simmonds

## 2016-07-14 ENCOUNTER — Other Ambulatory Visit: Payer: Self-pay | Admitting: Obstetrics and Gynecology

## 2016-07-14 NOTE — Telephone Encounter (Signed)
Medication refill request: Fluoxetine Last AEX:  12/06/15 SM Next AEX: 03/19/17 SM Last MMG (if hormonal medication request): 09/30/15 BIRADS1, Density C, [facility] Refill authorized: 04/13/16 #[id number]. Please advise. Thank you.

## 2016-09-01 ENCOUNTER — Other Ambulatory Visit: Payer: Self-pay | Admitting: Family Medicine

## 2016-09-01 DIAGNOSIS — Z1231 Encounter for screening mammogram for malignant neoplasm of breast: Secondary | ICD-10-CM

## 2016-10-20 ENCOUNTER — Ambulatory Visit (HOSPITAL_BASED_OUTPATIENT_CLINIC_OR_DEPARTMENT_OTHER)
Admission: RE | Admit: 2016-10-20 | Discharge: 2016-10-20 | Disposition: A | Discharge status: Home / Self Care | Payer: BLUE CROSS/BLUE SHIELD | Source: Ambulatory Visit | Attending: Family Medicine | Admitting: Family Medicine

## 2016-10-20 ENCOUNTER — Encounter (HOSPITAL_BASED_OUTPATIENT_CLINIC_OR_DEPARTMENT_OTHER): Payer: Self-pay

## 2016-10-20 DIAGNOSIS — Z1231 Encounter for screening mammogram for malignant neoplasm of breast: Secondary | ICD-10-CM | POA: Diagnosis not present

## 2016-10-20 DIAGNOSIS — R928 Other abnormal and inconclusive findings on diagnostic imaging of breast: Secondary | ICD-10-CM | POA: Insufficient documentation

## 2016-10-21 ENCOUNTER — Other Ambulatory Visit: Payer: Self-pay | Admitting: Family Medicine

## 2016-10-21 DIAGNOSIS — R928 Other abnormal and inconclusive findings on diagnostic imaging of breast: Secondary | ICD-10-CM

## 2016-10-22 ENCOUNTER — Telehealth: Payer: Self-pay | Admitting: *Deleted

## 2016-10-22 NOTE — Telephone Encounter (Signed)
Received Physician Orders from [facility]; forwarded to provider/SLS 09/06

## 2016-10-27 ENCOUNTER — Ambulatory Visit
Admission: RE | Admit: 2016-10-27 | Discharge: 2016-10-27 | Disposition: A | Discharge status: Home / Self Care | Payer: BLUE CROSS/BLUE SHIELD | Source: Ambulatory Visit | Attending: Family Medicine | Admitting: Family Medicine

## 2016-10-27 ENCOUNTER — Other Ambulatory Visit: Payer: Self-pay | Admitting: Family Medicine

## 2016-10-27 DIAGNOSIS — R928 Other abnormal and inconclusive findings on diagnostic imaging of breast: Secondary | ICD-10-CM

## 2016-10-27 DIAGNOSIS — N6489 Other specified disorders of breast: Secondary | ICD-10-CM

## 2016-10-28 ENCOUNTER — Ambulatory Visit
Admission: RE | Admit: 2016-10-28 | Discharge: 2016-10-28 | Disposition: A | Discharge status: Home / Self Care | Payer: BLUE CROSS/BLUE SHIELD | Source: Ambulatory Visit | Attending: Family Medicine | Admitting: Family Medicine

## 2016-10-28 ENCOUNTER — Other Ambulatory Visit: Payer: Self-pay | Admitting: Family Medicine

## 2016-10-28 DIAGNOSIS — N6489 Other specified disorders of breast: Secondary | ICD-10-CM

## 2016-11-13 ENCOUNTER — Other Ambulatory Visit: Payer: Self-pay | Admitting: General Surgery

## 2016-11-13 DIAGNOSIS — R928 Other abnormal and inconclusive findings on diagnostic imaging of breast: Secondary | ICD-10-CM

## 2016-12-16 ENCOUNTER — Other Ambulatory Visit: Payer: Self-pay | Admitting: General Surgery

## 2016-12-16 DIAGNOSIS — R928 Other abnormal and inconclusive findings on diagnostic imaging of breast: Secondary | ICD-10-CM

## 2016-12-23 ENCOUNTER — Other Ambulatory Visit: Payer: Self-pay

## 2016-12-23 ENCOUNTER — Encounter (HOSPITAL_BASED_OUTPATIENT_CLINIC_OR_DEPARTMENT_OTHER): Payer: Self-pay | Admitting: *Deleted

## 2016-12-28 ENCOUNTER — Encounter (HOSPITAL_BASED_OUTPATIENT_CLINIC_OR_DEPARTMENT_OTHER)
Admission: RE | Admit: 2016-12-28 | Discharge: 2016-12-28 | Disposition: A | Discharge status: Home / Self Care | Payer: BLUE CROSS/BLUE SHIELD | Source: Ambulatory Visit | Attending: General Surgery | Admitting: General Surgery

## 2016-12-28 DIAGNOSIS — J45909 Unspecified asthma, uncomplicated: Secondary | ICD-10-CM | POA: Diagnosis not present

## 2016-12-28 DIAGNOSIS — E119 Type 2 diabetes mellitus without complications: Secondary | ICD-10-CM | POA: Diagnosis not present

## 2016-12-28 DIAGNOSIS — N6032 Fibrosclerosis of left breast: Secondary | ICD-10-CM | POA: Diagnosis not present

## 2016-12-28 DIAGNOSIS — Z79899 Other long term (current) drug therapy: Secondary | ICD-10-CM | POA: Diagnosis not present

## 2016-12-28 DIAGNOSIS — N6489 Other specified disorders of breast: Secondary | ICD-10-CM | POA: Diagnosis not present

## 2016-12-28 DIAGNOSIS — D242 Benign neoplasm of left breast: Secondary | ICD-10-CM | POA: Diagnosis present

## 2016-12-28 DIAGNOSIS — Z7984 Long term (current) use of oral hypoglycemic drugs: Secondary | ICD-10-CM | POA: Diagnosis not present

## 2016-12-28 DIAGNOSIS — I10 Essential (primary) hypertension: Secondary | ICD-10-CM | POA: Diagnosis not present

## 2016-12-28 DIAGNOSIS — N6022 Fibroadenosis of left breast: Secondary | ICD-10-CM | POA: Diagnosis not present

## 2016-12-28 DIAGNOSIS — G4730 Sleep apnea, unspecified: Secondary | ICD-10-CM | POA: Diagnosis not present

## 2016-12-28 LAB — BASIC METABOLIC PANEL
Anion gap: 7 (ref 5–15)
BUN: 7 mg/dL (ref 6–20)
CHLORIDE: 100 mmol/L — AB (ref 101–111)
CO2: 31 mmol/L (ref 22–32)
CREATININE: 0.62 mg/dL (ref 0.44–1.00)
Calcium: 9.2 mg/dL (ref 8.9–10.3)
GFR calc Af Amer: >60 mL/min (ref >60)
GFR calc non Af Amer: >60 mL/min (ref >60)
GLUCOSE: 127 mg/dL — AB (ref 65–99)
POTASSIUM: 4.4 mmol/L (ref 3.5–5.1)
Sodium: 138 mmol/L (ref 135–145)

## 2016-12-28 LAB — CBC
HEMATOCRIT: 42.1 % (ref 36.0–46.0)
Hemoglobin: 13.2 g/dL (ref 12.0–15.0)
MCH: 29.6 pg (ref 26.0–34.0)
MCHC: 31.4 g/dL (ref 30.0–36.0)
MCV: 94.4 fL (ref 78.0–100.0)
Platelets: 294 10*3/uL (ref 150–400)
RBC: 4.46 MIL/uL (ref 3.87–5.11)
RDW: 14.2 % (ref 11.5–15.5)
WBC: 8.4 10*3/uL (ref 4.0–10.5)

## 2016-12-28 NOTE — Progress Notes (Signed)
Anesthesia consult per Dr. Oren Bracket ,EKG reviewed,  CBC ordered, instructed pt to bring over night bag in case pt needs to stay for 23 hour observation for sleep apnea. Dr. Marlowe Aschoff office notified of plan.  Ensure pre surgery drink given with instructions to complete by 0500 dos, pt verbalized understanding.

## 2016-12-29 ENCOUNTER — Ambulatory Visit
Admission: RE | Admit: 2016-12-29 | Discharge: 2016-12-29 | Disposition: A | Discharge status: Home / Self Care | Payer: BLUE CROSS/BLUE SHIELD | Source: Ambulatory Visit | Attending: General Surgery | Admitting: General Surgery

## 2016-12-29 DIAGNOSIS — R928 Other abnormal and inconclusive findings on diagnostic imaging of breast: Secondary | ICD-10-CM

## 2016-12-29 NOTE — H&P (Signed)
Yolanda [name] Location: [facility] Patient #: [id number] DOB: [birth year] Married / Language: English / Race: Black or African American Female   History of Present Illness  The patient is a 56 year old female who presents with a complaint of Breast problems. Patient is a 56 year old female referred by Dr. Jeanmarie Plant for a abnormal left mammogram. Patient has been having normal screening mammograms. She had a screening mammogram several weeks ago and architectural distortion was seen. This persisted on her diagnostic mammograms and ultrasound. 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 She was recommended to have an excisional biopsy. Patient has no history of cancer. She has no family history of breast cancer of which she is aware. 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   Pathology 10/27/2016 Breast, left, needle core biopsy, upper outer quadrant - FIBROADENOMA. - THERE IS NO EVIDENCE OF MALIGNANCY. 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 This was found to be discordant by Dr. Kristopher Oppenheim and excision is recommended.  dx mammo/us 10/27/2016 ACR Breast Density Category c: The breast tissue is heterogeneously dense, which may obscure small masses.  FINDINGS: The left breast distortion persists on additional imaging.  On physical exam, no suspicious lumps are identified.  Targeted ultrasound is performed, showing fibrocystic changes with no suspicious masses.  IMPRESSION: Persistent distortion in the upper outer left breast.  RECOMMENDATION: Recommend stereotactic biopsy of the persistent distortion.  Screening mammogram 10/20/16 ACR Breast Density Category c: The breast tissue is heterogeneously dense, which may obscure small masses.  FINDINGS: In the left breast, possible distortion warrants further evaluation. In the right breast, no findings suspicious for malignancy. Images were  processed with CAD.  IMPRESSION: Further evaluation is suggested for possible distortion in the left breast.   Past Surgical History  Breast Biopsy  Left. Colon Polyp Removal - Colonoscopy  Foot Surgery  Left. Gallbladder Surgery - Laparoscopic   Diagnostic Studies History Colonoscopy  1-5 years ago Mammogram  within last year Pap Smear  1-5 years ago  Allergies  No Known Drug Allergies  Allergies Reconciled   Medication History FLUoxetine HCl (10MG  Tablet, Oral) Active. HydroCHLOROthiazide (25MG  Tablet, Oral) Active. Jardiance (10MG  Tablet, Oral) Active. MetFORMIN HCl ER (500MG  Tablet ER 24HR, Oral) Active. Medications Reconciled  Social History Alcohol use  Occasional alcohol use. Caffeine use  Coffee. No drug use  Tobacco use  Never smoker.  Family History Diabetes Mellitus  Brother, Father, Mother. Heart Disease  Father, Sister. Heart disease in female family member before age 29  Heart disease in female family member before age 39   Pregnancy / Birth History Age at menarche  72 years. Gravida  0 Para  0  Other Problems  Asthma  Diabetes Mellitus  Hemorrhoids  Sleep Apnea     Review of Systems  General Not Present- Appetite Loss, Chills, Fatigue, Fever, Night Sweats, Weight Gain and Weight Loss. Skin Not Present- Change in Wart/Mole, Dryness, Hives, Jaundice, New Lesions, Non-Healing Wounds, Rash and Ulcer. HEENT Present- Visual Disturbances and Wears glasses/contact lenses. Not Present- Earache, Hearing Loss, Hoarseness, Nose Bleed, Oral Ulcers, Ringing in the Ears, Seasonal Allergies, Sinus Pain, Sore Throat and Yellow Eyes. Respiratory Present- Snoring and Wheezing. Not Present- Bloody sputum, Chronic Cough and Difficulty Breathing. Breast Present- Breast Pain. Not Present- Breast Mass, Nipple Discharge and Skin Changes. Cardiovascular Present- Swelling of Extremities. Not Present- Chest Pain, Difficulty Breathing Lying  Down, Leg Cramps, Palpitations, Rapid Heart  Rate and Shortness of Breath. Gastrointestinal Present- Nausea and Vomiting. Not Present- Abdominal Pain, Bloating, Bloody Stool, Change in Bowel Habits, Chronic diarrhea, Constipation, Difficulty Swallowing, Excessive gas, Gets full quickly at meals, Hemorrhoids, Indigestion and Rectal Pain. Female Genitourinary Present- Nocturia. Not Present- Frequency, Painful Urination, Pelvic Pain and Urgency. Musculoskeletal Present- Swelling of Extremities. Not Present- Back Pain, Joint Pain, Joint Stiffness, Muscle Pain and Muscle Weakness. Neurological Not Present- Decreased Memory, Fainting, Headaches, Numbness, Seizures, Tingling, Tremor, Trouble walking and Weakness. Psychiatric Present- Depression. Not Present- Anxiety, Bipolar, Change in Sleep Pattern, Fearful and Frequent crying. Endocrine Present- Cold Intolerance. Not Present- Excessive Hunger, Hair Changes, Heat Intolerance, Hot flashes and New Diabetes. Hematology Not Present- Blood Thinners, Easy Bruising, Excessive bleeding, Gland problems, HIV and Persistent Infections.  Vitals Weight: 181.6 lb Height: 63in Body Surface Area: 1.86 m Body Mass Index: 32.17 kg/m  Temp.: 97.86F  Pulse: 97 (Regular)  BP: 140/92 (Sitting, Left Arm, Standard)       Physical Exam General Mental Status-Alert. General Appearance-Consistent with stated age. Hydration-Well hydrated. Voice-Normal.  Head and Neck Head-normocephalic, atraumatic with no lesions or palpable masses. Trachea-midline. Thyroid Gland Characteristics - normal size and consistency.  Eye Eyeball - Bilateral-Extraocular movements intact. Sclera/Conjunctiva - Bilateral-No scleral icterus.  Chest and Lung Exam Chest and lung exam reveals -quiet, even and easy respiratory effort with no use of accessory muscles and on auscultation, normal breath sounds, no adventitious sounds and normal vocal  resonance. Inspection Chest Wall - Normal. Back - normal.  Breast Note: ptotic breasts bilaterally. Large breasts. heterogeneously dense. No palpable masses. No skin dimpling or nipple retraction. No nipple discharge. No LAD on either side. left lateral biopsy site healing well.   Cardiovascular Cardiovascular examination reveals -normal heart sounds, regular rate and rhythm with no murmurs and normal pedal pulses bilaterally.  Abdomen Inspection Inspection of the abdomen reveals - No Hernias. Palpation/Percussion Palpation and Percussion of the abdomen reveal - Soft, Non Tender, No Rebound tenderness, No Rigidity (guarding) and No hepatosplenomegaly. Auscultation Auscultation of the abdomen reveals - Bowel sounds normal.  Neurologic Neurologic evaluation reveals -alert and oriented x 3 with no impairment of recent or remote memory. Mental Status-Normal.  Musculoskeletal Global Assessment -Note: no gross deformities.  Normal Exam - Left-Upper Extremity Strength Normal and Lower Extremity Strength Normal. Normal Exam - Right-Upper Extremity Strength Normal and Lower Extremity Strength Normal.  Lymphatic Head & Neck  General Head & Neck Lymphatics: Bilateral - Description - Normal. Axillary  General Axillary Region: Bilateral - Description - Normal. Tenderness - Non Tender. Femoral & Inguinal  Generalized Femoral & Inguinal Lymphatics: Bilateral - Description - No Generalized lymphadenopathy.    Assessment & Plan ABNORMAL MAMMOGRAM OF LEFT BREAST (R92.8) Impression: Pt has an abnormal mammogram and discordant biopsy. Recommend excisional biopsy.  Discussed seed placement with patient. Reviewed risks with patient.  Discussed risk of infection, seroma, possiblity of cancer, heart or lung issues, blood clot, and more.  Pt has sleep apnea and does not use CPAP. She is also Jehovah's Witness and does not want to receive blood.  Will set this up. Discussed  post op restrictions. Current Plans Pt Education - [facility] Breast Biopsy [facility]: discussed with patient and provided information. Schedule for Surgery   Signed by Stark Klein, MD

## 2016-12-29 NOTE — Anesthesia Preprocedure Evaluation (Addendum)
Anesthesia Evaluation  Patient identified by MRN, date of birth, ID band Patient awake    Reviewed: Allergy & Precautions, NPO status , Patient's Chart, lab work & pertinent test results  History of Anesthesia Complications (+) PONV and history of anesthetic complications  Airway Mallampati: II  TM Distance: >3 FB Neck ROM: Full    Dental  (+) Dental Advisory Given   Pulmonary asthma , sleep apnea ,    breath sounds clear to auscultation       Cardiovascular hypertension, Pt. on medications  Rhythm:Regular Rate:Normal     Neuro/Psych negative neurological ROS     GI/Hepatic negative GI ROS, Neg liver ROS,   Endo/Other  diabetes, Type 2, Oral Hypoglycemic Agents  Renal/GU negative Renal ROS     Musculoskeletal  (+) Arthritis ,   Abdominal   Peds  Hematology negative hematology ROS (+)   Anesthesia Other Findings   Reproductive/Obstetrics                           Lab Results  Component Value Date   WBC 8.4 12/28/2016   HGB 13.2 12/28/2016   HCT 42.1 12/28/2016   MCV 94.4 12/28/2016   PLT 294 12/28/2016   Lab Results  Component Value Date   CREATININE 0.62 12/28/2016   BUN 7 12/28/2016   NA 138 12/28/2016   K 4.4 12/28/2016   CL 100 (L) 12/28/2016   CO2 31 12/28/2016    Anesthesia Physical Anesthesia Plan  ASA: II  Anesthesia Plan: General   Post-op Pain Management:    Induction: Intravenous  PONV Risk Score and Plan: 4 or greater and Ondansetron, Dexamethasone, Midazolam, Scopolamine patch - Pre-op and Treatment may vary due to age or medical condition  Airway Management Planned: LMA  Additional Equipment:   Intra-op Plan:   Post-operative Plan: Extubation in OR  Informed Consent: I have reviewed the patients History and Physical, chart, labs and discussed the procedure including the risks, benefits and alternatives for the proposed anesthesia with the patient  or authorized representative who has indicated his/her understanding and acceptance.   Dental advisory given  Plan Discussed with: CRNA  Anesthesia Plan Comments:        Anesthesia Quick Evaluation

## 2016-12-30 ENCOUNTER — Ambulatory Visit
Admission: RE | Admit: 2016-12-30 | Discharge: 2016-12-30 | Disposition: A | Discharge status: Home / Self Care | Payer: BLUE CROSS/BLUE SHIELD | Source: Ambulatory Visit | Attending: General Surgery | Admitting: General Surgery

## 2016-12-30 ENCOUNTER — Ambulatory Visit (HOSPITAL_BASED_OUTPATIENT_CLINIC_OR_DEPARTMENT_OTHER): Payer: BLUE CROSS/BLUE SHIELD | Admitting: Anesthesiology

## 2016-12-30 ENCOUNTER — Ambulatory Visit (HOSPITAL_BASED_OUTPATIENT_CLINIC_OR_DEPARTMENT_OTHER)
Admission: RE | Admit: 2016-12-30 | Discharge: 2016-12-30 | Disposition: A | Discharge status: Home / Self Care | Payer: BLUE CROSS/BLUE SHIELD | Source: Ambulatory Visit | Attending: General Surgery | Admitting: General Surgery

## 2016-12-30 ENCOUNTER — Encounter (HOSPITAL_BASED_OUTPATIENT_CLINIC_OR_DEPARTMENT_OTHER)
Admission: RE | Disposition: A | Discharge status: Home / Self Care | Payer: Self-pay | Source: Ambulatory Visit | Attending: General Surgery

## 2016-12-30 ENCOUNTER — Encounter (HOSPITAL_BASED_OUTPATIENT_CLINIC_OR_DEPARTMENT_OTHER): Payer: Self-pay | Admitting: *Deleted

## 2016-12-30 DIAGNOSIS — N6489 Other specified disorders of breast: Secondary | ICD-10-CM | POA: Insufficient documentation

## 2016-12-30 DIAGNOSIS — R928 Other abnormal and inconclusive findings on diagnostic imaging of breast: Secondary | ICD-10-CM

## 2016-12-30 DIAGNOSIS — Z7984 Long term (current) use of oral hypoglycemic drugs: Secondary | ICD-10-CM | POA: Insufficient documentation

## 2016-12-30 DIAGNOSIS — Z79899 Other long term (current) drug therapy: Secondary | ICD-10-CM | POA: Insufficient documentation

## 2016-12-30 DIAGNOSIS — N6022 Fibroadenosis of left breast: Secondary | ICD-10-CM | POA: Insufficient documentation

## 2016-12-30 DIAGNOSIS — I10 Essential (primary) hypertension: Secondary | ICD-10-CM | POA: Insufficient documentation

## 2016-12-30 DIAGNOSIS — G4730 Sleep apnea, unspecified: Secondary | ICD-10-CM | POA: Insufficient documentation

## 2016-12-30 DIAGNOSIS — E119 Type 2 diabetes mellitus without complications: Secondary | ICD-10-CM | POA: Insufficient documentation

## 2016-12-30 DIAGNOSIS — N6032 Fibrosclerosis of left breast: Secondary | ICD-10-CM | POA: Insufficient documentation

## 2016-12-30 DIAGNOSIS — J45909 Unspecified asthma, uncomplicated: Secondary | ICD-10-CM | POA: Insufficient documentation

## 2016-12-30 HISTORY — PX: RADIOACTIVE SEED GUIDED EXCISIONAL BREAST BIOPSY: SHX6490

## 2016-12-30 LAB — GLUCOSE, CAPILLARY
Glucose-Capillary: 151 mg/dL — ABNORMAL HIGH (ref 65–99)
Glucose-Capillary: 114 mg/dL — ABNORMAL HIGH (ref 65–99)

## 2016-12-30 SURGERY — RADIOACTIVE SEED GUIDED BREAST BIOPSY
Anesthesia: General | Site: Breast | Laterality: Left

## 2016-12-30 MED ORDER — BUPIVACAINE-EPINEPHRINE (PF) 0.5% -1:200000 IJ SOLN
INTRAMUSCULAR | Status: AC
  Filled 2016-12-30: qty 90

## 2016-12-30 MED ORDER — DEXAMETHASONE SODIUM PHOSPHATE 10 MG/ML IJ SOLN
INTRAMUSCULAR | Status: AC
  Filled 2016-12-30: qty 1

## 2016-12-30 MED ORDER — OXYCODONE HCL 5 MG PO TABS: 5.0000 mg | ORAL_TABLET | Freq: Four times a day (QID) | ORAL | 0 refills | Status: DC | PRN

## 2016-12-30 MED ORDER — EPHEDRINE SULFATE 50 MG/ML IJ SOLN
INTRAMUSCULAR | Status: DC | PRN
  Administered 2016-12-30: 25 mg via INTRAVENOUS

## 2016-12-30 MED ORDER — DEXAMETHASONE SODIUM PHOSPHATE 4 MG/ML IJ SOLN
INTRAMUSCULAR | Status: DC | PRN
  Administered 2016-12-30: 10 mg via INTRAVENOUS

## 2016-12-30 MED ORDER — PROPOFOL 10 MG/ML IV BOLUS
INTRAVENOUS | Status: AC
  Filled 2016-12-30: qty 20

## 2016-12-30 MED ORDER — PROPOFOL 10 MG/ML IV BOLUS
INTRAVENOUS | Status: DC | PRN
  Administered 2016-12-30: 200 mg via INTRAVENOUS

## 2016-12-30 MED ORDER — PROMETHAZINE HCL 25 MG/ML IJ SOLN: 6.2500 mg | INTRAMUSCULAR | Status: DC | PRN

## 2016-12-30 MED ORDER — MIDAZOLAM HCL 5 MG/5ML IJ SOLN
INTRAMUSCULAR | Status: DC | PRN
  Administered 2016-12-30: 2 mg via INTRAVENOUS

## 2016-12-30 MED ORDER — CEFAZOLIN SODIUM-DEXTROSE 2-4 GM/100ML-% IV SOLN
2.0000 g | INTRAVENOUS | Status: AC
  Administered 2016-12-30: 2 g via INTRAVENOUS

## 2016-12-30 MED ORDER — BUPIVACAINE-EPINEPHRINE (PF) 0.5% -1:200000 IJ SOLN
INTRAMUSCULAR | Status: DC | PRN
  Administered 2016-12-30: 20 mL via INTRAMUSCULAR

## 2016-12-30 MED ORDER — PHENYLEPHRINE 40 MCG/ML (10ML) SYRINGE FOR IV PUSH (FOR BLOOD PRESSURE SUPPORT)
PREFILLED_SYRINGE | INTRAVENOUS | Status: AC
  Filled 2016-12-30: qty 10

## 2016-12-30 MED ORDER — EPHEDRINE 5 MG/ML INJ
INTRAVENOUS | Status: AC
  Filled 2016-12-30: qty 20

## 2016-12-30 MED ORDER — OXYCODONE HCL 5 MG/5ML PO SOLN: 5.0000 mg | Freq: Once | ORAL | Status: DC | PRN

## 2016-12-30 MED ORDER — PHENYLEPHRINE HCL 10 MG/ML IJ SOLN
INTRAMUSCULAR | Status: DC | PRN
  Administered 2016-12-30 (×4): 80 ug via INTRAVENOUS

## 2016-12-30 MED ORDER — FENTANYL CITRATE (PF) 100 MCG/2ML IJ SOLN
INTRAMUSCULAR | Status: DC | PRN
Start: 2016-12-30 — End: 2016-12-30
  Administered 2016-12-30: 25 ug via INTRAVENOUS
  Administered 2016-12-30 (×2): 50 ug via INTRAVENOUS

## 2016-12-30 MED ORDER — OXYCODONE HCL 5 MG PO TABS: 5.0000 mg | ORAL_TABLET | Freq: Once | ORAL | Status: DC | PRN

## 2016-12-30 MED ORDER — HYDROMORPHONE HCL 1 MG/ML IJ SOLN: 0.2500 mg | INTRAMUSCULAR | Status: DC | PRN

## 2016-12-30 MED ORDER — SCOPOLAMINE 1 MG/3DAYS TD PT72
1.0000 | MEDICATED_PATCH | Freq: Once | TRANSDERMAL | Status: DC | PRN
  Administered 2016-12-30: 1.5 mg via TRANSDERMAL

## 2016-12-30 MED ORDER — FENTANYL CITRATE (PF) 100 MCG/2ML IJ SOLN
INTRAMUSCULAR | Status: AC
  Filled 2016-12-30: qty 2

## 2016-12-30 MED ORDER — CEFAZOLIN SODIUM-DEXTROSE 2-4 GM/100ML-% IV SOLN
INTRAVENOUS | Status: AC
  Filled 2016-12-30: qty 100

## 2016-12-30 MED ORDER — FENTANYL CITRATE (PF) 100 MCG/2ML IJ SOLN: 50.0000 ug | INTRAMUSCULAR | Status: DC | PRN

## 2016-12-30 MED ORDER — ONDANSETRON HCL 4 MG/2ML IJ SOLN
INTRAMUSCULAR | Status: AC
  Filled 2016-12-30: qty 2

## 2016-12-30 MED ORDER — SCOPOLAMINE 1 MG/3DAYS TD PT72
MEDICATED_PATCH | TRANSDERMAL | Status: AC
  Filled 2016-12-30: qty 1

## 2016-12-30 MED ORDER — CHLORHEXIDINE GLUCONATE CLOTH 2 % EX PADS: 6.0000 | MEDICATED_PAD | Freq: Once | CUTANEOUS | Status: DC

## 2016-12-30 MED ORDER — MIDAZOLAM HCL 2 MG/2ML IJ SOLN: 1.0000 mg | INTRAMUSCULAR | Status: DC | PRN

## 2016-12-30 MED ORDER — LIDOCAINE HCL (CARDIAC) 20 MG/ML IV SOLN
INTRAVENOUS | Status: DC | PRN
  Administered 2016-12-30: 30 mg via INTRAVENOUS

## 2016-12-30 MED ORDER — LACTATED RINGERS IV SOLN
INTRAVENOUS | Status: DC
  Administered 2016-12-30 (×3): via INTRAVENOUS

## 2016-12-30 MED ORDER — ONDANSETRON HCL 4 MG/2ML IJ SOLN
INTRAMUSCULAR | Status: DC | PRN
  Administered 2016-12-30: 4 mg via INTRAVENOUS

## 2016-12-30 MED ORDER — MIDAZOLAM HCL 2 MG/2ML IJ SOLN
INTRAMUSCULAR | Status: AC
  Filled 2016-12-30: qty 2

## 2016-12-30 MED ORDER — LIDOCAINE HCL (PF) 1 % IJ SOLN
INTRAMUSCULAR | Status: AC
  Filled 2016-12-30: qty 90

## 2016-12-30 MED ORDER — FENTANYL CITRATE (PF) 100 MCG/2ML IJ SOLN: 25.0000 ug | INTRAMUSCULAR | Status: DC | PRN

## 2016-12-30 MED ORDER — KETOROLAC TROMETHAMINE 30 MG/ML IJ SOLN: 30.0000 mg | Freq: Once | INTRAMUSCULAR | Status: DC | PRN

## 2016-12-30 SURGICAL SUPPLY — 54 items
ADH SKN CLS APL DERMABOND .7 (GAUZE/BANDAGES/DRESSINGS) ×1
BINDER BREAST XXLRG (GAUZE/BANDAGES/DRESSINGS) ×1 IMPLANT
BLADE SURG 10 STRL SS (BLADE) ×2 IMPLANT
BLADE SURG 15 STRL LF DISP TIS (BLADE) IMPLANT
BLADE SURG 15 STRL SS (BLADE) ×2
CANISTER SUC SOCK COL 7IN (MISCELLANEOUS) IMPLANT
CANISTER SUCT 1200ML W/VALVE (MISCELLANEOUS) ×2 IMPLANT
CHLORAPREP W/TINT 26ML (MISCELLANEOUS) ×2 IMPLANT
CLIP VESOCCLUDE LG 6/CT (CLIP) ×2 IMPLANT
COVER BACK TABLE 60X90IN (DRAPES) ×2 IMPLANT
COVER MAYO STAND STRL (DRAPES) ×2 IMPLANT
COVER PROBE W GEL 5X96 (DRAPES) ×2 IMPLANT
DECANTER SPIKE VIAL GLASS SM (MISCELLANEOUS) IMPLANT
DERMABOND ADVANCED (GAUZE/BANDAGES/DRESSINGS) ×1
DERMABOND ADVANCED .7 DNX12 (GAUZE/BANDAGES/DRESSINGS) ×1 IMPLANT
DEVICE DUBIN W/COMP PLATE 8390 (MISCELLANEOUS) ×2 IMPLANT
DRAPE LAPAROSCOPIC ABDOMINAL (DRAPES) ×2 IMPLANT
DRAPE UTILITY XL STRL (DRAPES) ×2 IMPLANT
ELECT COATED BLADE 2.86 ST (ELECTRODE) ×2 IMPLANT
ELECT REM PT RETURN 9FT ADLT (ELECTROSURGICAL) ×2
ELECTRODE REM PT RTRN 9FT ADLT (ELECTROSURGICAL) ×1 IMPLANT
GAUZE SPONGE 4X4 12PLY STRL LF (GAUZE/BANDAGES/DRESSINGS) ×2 IMPLANT
GLOVE BIO SURGEON STRL SZ 6 (GLOVE) ×2 IMPLANT
GLOVE BIO SURGEON STRL SZ 6.5 (GLOVE) ×1 IMPLANT
GLOVE BIOGEL PI IND STRL 6.5 (GLOVE) ×1 IMPLANT
GLOVE BIOGEL PI IND STRL 7.0 (GLOVE) IMPLANT
GLOVE BIOGEL PI INDICATOR 6.5 (GLOVE) ×1
GLOVE BIOGEL PI INDICATOR 7.0 (GLOVE) ×1
GOWN STRL REUS W/ TWL LRG LVL3 (GOWN DISPOSABLE) ×1 IMPLANT
GOWN STRL REUS W/TWL 2XL LVL3 (GOWN DISPOSABLE) ×2 IMPLANT
GOWN STRL REUS W/TWL LRG LVL3 (GOWN DISPOSABLE) ×2
KIT MARKER MARGIN INK (KITS) ×2 IMPLANT
LIGHT WAVEGUIDE WIDE FLAT (MISCELLANEOUS) IMPLANT
NDL HYPO 25X1 1.5 SAFETY (NEEDLE) ×1 IMPLANT
NEEDLE HYPO 25X1 1.5 SAFETY (NEEDLE) ×2 IMPLANT
NS IRRIG 1000ML POUR BTL (IV SOLUTION) ×2 IMPLANT
PACK BASIN DAY SURGERY FS (CUSTOM PROCEDURE TRAY) ×2 IMPLANT
PENCIL BUTTON HOLSTER BLD 10FT (ELECTRODE) ×2 IMPLANT
SLEEVE SCD COMPRESS KNEE MED (MISCELLANEOUS) ×2 IMPLANT
SPONGE LAP 18X18 X RAY DECT (DISPOSABLE) ×2 IMPLANT
STAPLER VISISTAT 35W (STAPLE) IMPLANT
STRIP CLOSURE SKIN 1/2X4 (GAUZE/BANDAGES/DRESSINGS) ×2 IMPLANT
SUT MON AB 4-0 PC3 18 (SUTURE) ×2 IMPLANT
SUT SILK 2 0 SH (SUTURE) IMPLANT
SUT VIC AB 2-0 SH 18 (SUTURE) IMPLANT
SUT VIC AB 3-0 SH 27 (SUTURE) ×2
SUT VIC AB 3-0 SH 27X BRD (SUTURE) ×1 IMPLANT
SUT VICRYL 3-0 CR8 SH (SUTURE) IMPLANT
SYR BULB 3OZ (MISCELLANEOUS) ×2 IMPLANT
SYR CONTROL 10ML LL (SYRINGE) ×2 IMPLANT
TOWEL OR 17X24 6PK STRL BLUE (TOWEL DISPOSABLE) ×2 IMPLANT
TOWEL OR NON WOVEN STRL DISP B (DISPOSABLE) IMPLANT
TUBE CONNECTING 20X1/4 (TUBING) ×2 IMPLANT
YANKAUER SUCT BULB TIP NO VENT (SUCTIONS) ×2 IMPLANT

## 2016-12-30 NOTE — Interval H&P Note (Signed)
History and Physical Interval Note:  12/30/2016 8:30 AM  Yolanda [name]  has presented today for surgery, with the diagnosis of LEFT BREAST ABNORMAL MAMMOGRAM  The various methods of treatment have been discussed with the patient and family. After consideration of risks, benefits and other options for treatment, the patient has consented to  Procedure(s): RADIOACTIVE SEED GUIDED EXCISIONAL BREAST BIOPSY (Left) as a surgical intervention .  The patient's history has been reviewed, patient examined, no change in status, stable for surgery.  I have reviewed the patient's chart and labs.  Questions were answered to the patient's satisfaction.     Kamber Vignola

## 2016-12-30 NOTE — Transfer of Care (Signed)
Immediate Anesthesia Transfer of Care Note  Patient: Yolanda [name]  Procedure(s) Performed: RADIOACTIVE SEED GUIDED EXCISIONAL BREAST BIOPSY (Left Breast)  Patient Location: PACU  Anesthesia Type:General  Level of Consciousness: sedated  Airway & Oxygen Therapy: Patient Spontanous Breathing and Patient connected to face mask oxygen  Post-op Assessment: Report given to RN and Post -op Vital signs reviewed and stable  Post vital signs: Reviewed and stable  Last Vitals:  Vitals:   12/30/16 0753  BP: (!) 140/98  Pulse: 91  Temp: 36.9 C  SpO2: 98%    Last Pain:  Vitals:   12/30/16 0753  TempSrc: Oral         Complications: No apparent anesthesia complications

## 2016-12-30 NOTE — Discharge Instructions (Signed)
°Post Anesthesia Home Care Instructions ° °Activity: °Get plenty of rest for the remainder of the day. A responsible individual must stay with you for 24 hours following the procedure.  °For the next 24 hours, DO NOT: °-Drive a car °-Operate machinery °-Drink alcoholic beverages °-Take any medication unless instructed by your physician °-Make any legal decisions or sign important papers. ° °Meals: °Start with liquid foods such as gelatin or soup. Progress to regular foods as tolerated. Avoid greasy, spicy, heavy foods. If nausea and/or vomiting occur, drink only clear liquids until the nausea and/or vomiting subsides. Call your physician if vomiting continues. ° °Special Instructions/Symptoms: °Your throat may feel dry or sore from the anesthesia or the breathing tube placed in your throat during surgery. If this causes discomfort, gargle with warm salt water. The discomfort should disappear within 24 hours. ° °If you had a scopolamine patch placed behind your ear for the management of post- operative nausea and/or vomiting: ° °1. The medication in the patch is effective for 72 hours, after which it should be removed.  Wrap patch in a tissue and discard in the trash. Wash hands thoroughly with soap and water. °2. You may remove the patch earlier than 72 hours if you experience unpleasant side effects which may include dry mouth, dizziness or visual disturbances. °3. Avoid touching the patch. Wash your hands with soap and water after contact with the patch. °   °[facility] °Office Phone Number [phone] ° °BREAST BIOPSY/ PARTIAL MASTECTOMY: POST OP INSTRUCTIONS ° °Always review your discharge instruction sheet given to you by the facility where your surgery was performed. ° °IF YOU HAVE DISABILITY OR FAMILY LEAVE FORMS, YOU MUST BRING THEM TO THE OFFICE FOR PROCESSING.  DO NOT GIVE THEM TO YOUR DOCTOR. ° °1. A prescription for pain medication may be given to you upon discharge.  Take your pain  medication as prescribed, if needed.  If narcotic pain medicine is not needed, then you may take acetaminophen (Tylenol) or ibuprofen (Advil) as needed. °2. Take your usually prescribed medications unless otherwise directed °3. If you need a refill on your pain medication, please contact your pharmacy.  They will contact our office to request authorization.  Prescriptions will not be filled after 5pm or on week-ends. °4. You should eat very light the first 24 hours after surgery, such as soup, crackers, pudding, etc.  Resume your normal diet the day after surgery. °5. Most patients will experience some swelling and bruising in the breast.  Ice packs and a good support bra will help.  Swelling and bruising can take several days to resolve.  °6. It is common to experience some constipation if taking pain medication after surgery.  Increasing fluid intake and taking a stool softener will usually help or prevent this problem from occurring.  A mild laxative (Milk of Magnesia or Miralax) should be taken according to package directions if there are no bowel movements after 48 hours. °7. Unless discharge instructions indicate otherwise, you may remove your bandages 48 hours after surgery, and you may shower at that time.  You may have steri-strips (small skin tapes) in place directly over the incision.  These strips should be left on the skin for 7-10 days.   Any sutures or staples will be removed at the office during your follow-up visit. °8. ACTIVITIES:  You may resume regular daily activities (gradually increasing) beginning the next day.  Wearing a good support bra or sports bra (or the breast binder) minimizes   pain and swelling.  You may have sexual intercourse when it is comfortable. °a. You may drive when you no longer are taking prescription pain medication, you can comfortably wear a seatbelt, and you can safely maneuver your car and apply brakes. °b. RETURN TO WORK:  __________1 week_______________ °9. You should  see your doctor in the office for a follow-up appointment approximately two weeks after your surgery.  Your doctor’s nurse will typically make your follow-up appointment when she calls you with your pathology report.  Expect your pathology report 2-3 business days after your surgery.  You may call to check if you do not hear from us after three days. ° ° °WHEN TO CALL YOUR DOCTOR: °1. Fever over 101.0 °2. Nausea and/or vomiting. °3. Extreme swelling or bruising. °4. Continued bleeding from incision. °5. Increased pain, redness, or drainage from the incision. ° °The clinic staff is available to answer your questions during regular business hours.  Please don’t hesitate to call and ask to speak to one of the nurses for clinical concerns.  If you have a medical emergency, go to the nearest emergency room or call 911.  A surgeon from [facility] is always on call at the hospital. ° °For further questions, please visit [email or web address]  ° °

## 2016-12-30 NOTE — Anesthesia Procedure Notes (Signed)
Procedure Name: LMA Insertion Date/Time: 12/30/2016 8:47 AM Performed by: Marrianne Mood, CRNA Pre-anesthesia Checklist: Patient identified, Emergency Drugs available, Suction available, Patient being monitored and Timeout performed Patient Re-evaluated:Patient Re-evaluated prior to induction Oxygen Delivery Method: Circle system utilized Preoxygenation: Pre-oxygenation with 100% oxygen Induction Type: IV induction Ventilation: Mask ventilation without difficulty LMA: LMA inserted LMA Size: 4.0 Number of attempts: 1 Airway Equipment and Method: Bite block Placement Confirmation: positive ETCO2 Tube secured with: Tape Dental Injury: Teeth and Oropharynx as per pre-operative assessment

## 2016-12-30 NOTE — Anesthesia Postprocedure Evaluation (Signed)
Anesthesia Post Note  Patient: Yolanda [name]  Procedure(s) Performed: RADIOACTIVE SEED GUIDED EXCISIONAL BREAST BIOPSY (Left Breast)     Patient location during evaluation: PACU Anesthesia Type: General Level of consciousness: awake and alert Pain management: pain level controlled Vital Signs Assessment: post-procedure vital signs reviewed and stable Respiratory status: spontaneous breathing, nonlabored ventilation, respiratory function stable and patient connected to nasal cannula oxygen Cardiovascular status: blood pressure returned to baseline and stable Postop Assessment: no apparent nausea or vomiting Anesthetic complications: no    Last Vitals:  Vitals:   12/30/16 1030 12/30/16 1115  BP: (!) 165/90 (!) 165/90  Pulse: 96 96  Resp: 18 18  Temp: 36.7 C 36.7 C  SpO2: 98% 98%    Last Pain:  Vitals:   12/30/16 0753  TempSrc: Oral                 Tiajuana Amass

## 2016-12-30 NOTE — Op Note (Signed)
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 The risks, benefits, complications, treatment options, and expected outcomes were discussed with the patient. The possibilities of bleeding, infection, the need for additional procedures, failure to diagnose a condition, and creating a complication requiring transfusion or operation were discussed with the patient. The patient concurred with the proposed plan, giving informed consent.  The site of surgery properly noted/marked. The patient was taken to Operating Room # 8, identified, and the procedure verified as Left Breast Seed localized excisional biopsy. A Time Out was held and the above information confirmed.  The left breast and chest were prepped and draped in standard fashion. The excisional biopsy was performed by creating a circumareolar incision near the previously placed radioactive seed.  Dissection was carried down to around the point of maximum signal intensity. The cautery was used to perform the dissection.  One clip was used to mark the biopsy cavity. The specimen was inked with the margin marker paint kit.    Specimen radiography confirmed inclusion of the seed.  Additional inferior margin was taken and this included the clip.  This specimen was also marked with the margin marker paint kit.  The background signal in the breast was zero.  Hemostasis was achieved with cautery. The wound was irrigated and closed with 3-0 vicryl in layers and 4-0 monocryl subcuticular suture.      Sterile dressings were applied. At the end of the operation, all sponge,  instrument, and needle counts were correct.  Findings: grossly clear surgical margins and no adenopathy  Estimated Blood Loss:  min         Specimens: left breast tissue with seed.  Additional inferior margin with clip.         Complications:  None; patient tolerated the procedure well.         Disposition: PACU - hemodynamically stable.         Condition: stable

## 2016-12-31 ENCOUNTER — Encounter (HOSPITAL_BASED_OUTPATIENT_CLINIC_OR_DEPARTMENT_OTHER): Payer: Self-pay | Admitting: General Surgery

## 2016-12-31 NOTE — Progress Notes (Signed)
Please let patient know that pathology is benign.

## 2017-01-11 NOTE — Addendum Note (Signed)
Addendum  created 01/11/17 0939 by Suzette Battiest, MD   Intraprocedure Staff edited

## 2017-01-18 ENCOUNTER — Encounter: Payer: Self-pay | Admitting: Oncology

## 2017-01-18 ENCOUNTER — Telehealth: Payer: Self-pay | Admitting: Oncology

## 2017-01-18 NOTE — Telephone Encounter (Signed)
Appt has been scheduled for the pt to see Dr. Jana Hakim on 12/17 at 4pm. Pt aware to arrive 30 minutes early. Letter mailed to the pt.

## 2017-01-22 ENCOUNTER — Encounter: Payer: Self-pay | Admitting: Family Medicine

## 2017-01-22 ENCOUNTER — Ambulatory Visit (INDEPENDENT_AMBULATORY_CARE_PROVIDER_SITE_OTHER): Payer: BLUE CROSS/BLUE SHIELD | Admitting: Family Medicine

## 2017-01-22 DIAGNOSIS — J351 Hypertrophy of tonsils: Secondary | ICD-10-CM | POA: Diagnosis not present

## 2017-01-22 DIAGNOSIS — Z23 Encounter for immunization: Secondary | ICD-10-CM | POA: Diagnosis not present

## 2017-01-22 DIAGNOSIS — E119 Type 2 diabetes mellitus without complications: Secondary | ICD-10-CM

## 2017-01-22 DIAGNOSIS — E669 Obesity, unspecified: Secondary | ICD-10-CM | POA: Diagnosis not present

## 2017-01-22 DIAGNOSIS — I10 Essential (primary) hypertension: Secondary | ICD-10-CM | POA: Diagnosis not present

## 2017-01-22 DIAGNOSIS — D242 Benign neoplasm of left breast: Secondary | ICD-10-CM | POA: Diagnosis not present

## 2017-01-22 DIAGNOSIS — G4733 Obstructive sleep apnea (adult) (pediatric): Secondary | ICD-10-CM

## 2017-01-22 DIAGNOSIS — D249 Benign neoplasm of unspecified breast: Secondary | ICD-10-CM | POA: Insufficient documentation

## 2017-01-22 LAB — LIPID PANEL
CHOLESTEROL: 175 mg/dL (ref 0–200)
HDL: 61.4 mg/dL (ref >39.00)
LDL Cholesterol: 84 mg/dL (ref 0–99)
NonHDL: 113.27
Total CHOL/HDL Ratio: 3
Triglycerides: 144 mg/dL (ref 0.0–149.0)
VLDL: 28.8 mg/dL (ref 0.0–40.0)

## 2017-01-22 LAB — COMPREHENSIVE METABOLIC PANEL
ALBUMIN: 4.3 g/dL (ref 3.5–5.2)
ALK PHOS: 127 U/L — AB (ref 39–117)
ALT: 58 U/L — ABNORMAL HIGH (ref 0–35)
AST: 36 U/L (ref 0–37)
BUN: 15 mg/dL (ref 6–23)
CO2: 31 mEq/L (ref 19–32)
Calcium: 9.6 mg/dL (ref 8.4–10.5)
Chloride: 100 mEq/L (ref 96–112)
Creatinine, Ser: 0.59 mg/dL (ref 0.40–1.20)
GFR: 111.38 mL/min (ref >60.00)
Glucose, Bld: 104 mg/dL — ABNORMAL HIGH (ref 70–99)
POTASSIUM: 4 meq/L (ref 3.5–5.1)
Sodium: 139 mEq/L (ref 135–145)
TOTAL PROTEIN: 7.1 g/dL (ref 6.0–8.3)
Total Bilirubin: 0.3 mg/dL (ref 0.2–1.2)

## 2017-01-22 LAB — CBC
HEMATOCRIT: 40.7 % (ref 36.0–46.0)
HEMOGLOBIN: 12.9 g/dL (ref 12.0–15.0)
MCHC: 31.8 g/dL (ref 30.0–36.0)
MCV: 93.5 fl (ref 78.0–100.0)
PLATELETS: 277 10*3/uL (ref 150.0–400.0)
RBC: 4.35 Mil/uL (ref 3.87–5.11)
RDW: 14.6 % (ref 11.5–15.5)
WBC: 6.6 10*3/uL (ref 4.0–10.5)

## 2017-01-22 LAB — TSH: TSH: 2.82 u[IU]/mL (ref 0.35–4.50)

## 2017-01-22 LAB — HEMOGLOBIN A1C: Hgb A1c MFr Bld: 7.2 % — ABNORMAL HIGH (ref 4.6–6.5)

## 2017-01-22 NOTE — Patient Instructions (Addendum)
Shingrix is the new shingles shot, 2 shots over 2-6 months  Encouraged increased hydration and fiber in diet. Daily probiotics. If bowels not moving can use MOM 2 tbls po in 4 oz of warm prune juice by mouth every 2-3 days. If no results then repeat in 4 hours with  Dulcolax suppository pr, may repeat again in 4 more hours as needed. Seek care if symptoms worsen. Consider daily Miralax and/or Dulcolax if symptoms persist.  Carbohydrate Counting for Diabetes Mellitus, Adult Carbohydrate counting is a method for keeping track of how many carbohydrates you eat. Eating carbohydrates naturally increases the amount of sugar (glucose) in the blood. Counting how many carbohydrates you eat helps keep your blood glucose within normal limits, which helps you manage your diabetes (diabetes mellitus). It is important to know how many carbohydrates you can safely have in each meal. This is different for every person. A diet and nutrition specialist (registered dietitian) can help you make a meal plan and calculate how many carbohydrates you should have at each meal and snack. Carbohydrates are found in the following foods:  Grains, such as breads and cereals.  Dried beans and soy products.  Starchy vegetables, such as potatoes, peas, and corn.  Fruit and fruit juices.  Milk and yogurt.  Sweets and snack foods, such as cake, cookies, candy, chips, and soft drinks.  How do I count carbohydrates? There are two ways to count carbohydrates in food. You can use either of the methods or a combination of both. Reading "Nutrition Facts" on packaged food The "Nutrition Facts" list is included on the labels of almost all packaged foods and beverages in the [place] It includes:  The serving size.  Information about nutrients in each serving, including the grams (g) of carbohydrate per serving.  To use the "Nutrition Facts":  Decide how many servings you will have.  Multiply the number of servings by the number  of carbohydrates per serving.  The resulting number is the total amount of carbohydrates that you will be having.  Learning standard serving sizes of other foods When you eat foods containing carbohydrates that are not packaged or do not include "Nutrition Facts" on the label, you need to measure the servings in order to count the amount of carbohydrates:  Measure the foods that you will eat with a food scale or measuring cup, if needed.  Decide how many standard-size servings you will eat.  Multiply the number of servings by 15. Most carbohydrate-rich foods have about 15 g of carbohydrates per serving. ? For example, if you eat 8 oz (170 g) of strawberries, you will have eaten 2 servings and 30 g of carbohydrates (2 servings x 15 g = 30 g).  For foods that have more than one food mixed, such as soups and casseroles, you must count the carbohydrates in each food that is included.  The following list contains standard serving sizes of common carbohydrate-rich foods. Each of these servings has about 15 g of carbohydrates:   hamburger bun or  English muffin.   oz (15 mL) syrup.   oz (14 g) jelly.  1 slice of bread.  1 six-inch tortilla.  3 oz (85 g) cooked rice or pasta.  4 oz (113 g) cooked dried beans.  4 oz (113 g) starchy vegetable, such as peas, corn, or potatoes.  4 oz (113 g) hot cereal.  4 oz (113 g) mashed potatoes or  of a large baked potato.  4 oz (113 g) canned  or frozen fruit.  4 oz (120 mL) fruit juice.  4-6 crackers.  6 chicken nuggets.  6 oz (170 g) unsweetened dry cereal.  6 oz (170 g) plain fat-free yogurt or yogurt sweetened with artificial sweeteners.  8 oz (240 mL) milk.  8 oz (170 g) fresh fruit or one small piece of fruit.  24 oz (680 g) popped popcorn.  Example of carbohydrate counting Sample meal  3 oz (85 g) chicken breast.  6 oz (170 g) brown rice.  4 oz (113 g) corn.  8 oz (240 mL) milk.  8 oz (170 g) strawberries with  sugar-free whipped topping. Carbohydrate calculation 1. Identify the foods that contain carbohydrates: ? Rice. ? Corn. ? Milk. ? Strawberries. 2. Calculate how many servings you have of each food: ? 2 servings rice. ? 1 serving corn. ? 1 serving milk. ? 1 serving strawberries. 3. Multiply each number of servings by 15 g: ? 2 servings rice x 15 g = 30 g. ? 1 serving corn x 15 g = 15 g. ? 1 serving milk x 15 g = 15 g. ? 1 serving strawberries x 15 g = 15 g. 4. Add together all of the amounts to find the total grams of carbohydrates eaten: ? 30 g + 15 g + 15 g + 15 g = 75 g of carbohydrates total. This information is not intended to replace advice given to you by your health care provider. Make sure you discuss any questions you have with your health care provider. Document Released: 02/02/2005 Document Revised: 08/23/2015 Document Reviewed: 07/17/2015 Elsevier Interactive Patient Education  [employer].

## 2017-01-22 NOTE — Assessment & Plan Note (Signed)
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

## 2017-01-22 NOTE — Assessment & Plan Note (Addendum)
Declines referral to ENT today, has seen Dr Ernesto Rutherford in past, 2-3 plus b/l

## 2017-01-22 NOTE — Assessment & Plan Note (Addendum)
Not using CPAP. Has been more than 5 years agrees to new consultation with new MD

## 2017-01-22 NOTE — Assessment & Plan Note (Signed)
Well controlled, no changes to meds. Encouraged heart healthy diet such as the DASH diet and exercise as tolerated.  °

## 2017-01-22 NOTE — Assessment & Plan Note (Addendum)
hgba1c acceptable, minimize simple carbs. Increase exercise as tolerated. fasting sugars ranging from 120 to 160. Check hgba1c

## 2017-01-22 NOTE — Assessment & Plan Note (Addendum)
Encouraged DASH diet, decrease po intake and increase exercise as tolerated. Needs 7-8 hours of sleep nightly. Avoid trans fats, eat small, frequent meals every 4-5 hours with lean proteins, complex carbs and healthy fats. Minimize simple carbs.consider bariatric referral 

## 2017-01-22 NOTE — Progress Notes (Signed)
Subjective:  I acted as a Education administrator for Dr. Charlett Blake. [place], [place]  Patient ID: Yolanda [name], female    DOB: [birth year], 56 y.o.   MRN: [id number]  No chief complaint on file.   HPI  Patient is in today for a follow up visit for her Diabetes and is feeling well. No recent febrile illness or acute hospitalizations. She underwent a partial mestectomy on the left on November 14 but the pathology was good. She is relieved the site is healing well. No polyuria or polydipsia. Denies CP/palp/SOB/HA/congestion/fevers/GI or GU c/o. Taking meds as prescribed  Patient Care Team: Mosie Lukes, MD as PCP - General (Family Medicine) Thornell Sartorius, MD as Consulting Physician (Otolaryngology) Michel Bickers, MD as Consulting Physician (Infectious Diseases) Megan Salon, MD as Consulting Physician (Gynecology) Christain Sacramento, [place] as Referring Physician (Optometry) Seth Bake, Danella Deis, MD (Family Medicine)   Past Medical History:  Diagnosis Date  . Asthma    with respiratory infection  . Diabetes mellitus without complication ([place])    type II  . Fibroid uterus   . Hypertension   . Osteoarthritis 03/10/2015  . PONV (postoperative nausea and vomiting)    pt stopped breathing during colonoscopy  . Refusal of blood transfusions as patient is Jehovah's Witness    NO BLOOD PRODUCTS  . Sleep apnea     Past Surgical History:  Procedure Laterality Date  . BREAST CYST ASPIRATION    . CHOLECYSTECTOMY  1/11  . COLONOSCOPY    . DILATATION & CURRETTAGE/HYSTEROSCOPY WITH RESECTOCOPE N/A 03/13/2014   Procedure: DILATATION & CURETTAGE/HYSTEROSCOPY ;  Surgeon: Lyman Speller, MD;  Location: [facility];  Service: Gynecology;  Laterality: N/A;  . RADIOACTIVE SEED GUIDED EXCISIONAL BREAST BIOPSY Left 12/30/2016   Procedure: RADIOACTIVE SEED GUIDED EXCISIONAL BREAST BIOPSY;  Surgeon: Stark Klein, MD;  Location: [facility];  Service: General;  Laterality: Left;    Family History    Problem Relation Age of Onset  . Asthma Mother   . Diabetes Mother   . Diabetes Father   . Heart attack Father   . Diabetes Brother   . Diabetes Brother   . Asthma Brother   . Heart Problems Sister   . Asthma Sister   . Hypertension Sister   . Heart disease Maternal Grandfather   . Osteopenia Sister     Social History   Socioeconomic History  . Marital status: Married    Spouse name: Not on file  . Number of children: Not on file  . Years of education: Not on file  . Highest education level: Not on file  Social Needs  . Financial resource strain: Not on file  . Food insecurity - worry: Not on file  . Food insecurity - inability: Not on file  . Transportation needs - medical: Not on file  . Transportation needs - non-medical: Not on file  Occupational History  . Not on file  Tobacco Use  . Smoking status: Never Smoker  . Smokeless tobacco: Never Used  Substance and Sexual Activity  . Alcohol use: Yes    Alcohol/week: 0.0 oz    Comment: occ  . Drug use: No  . 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 Not on file  Social History Narrative  . Not on file  Outpatient Medications Prior to Visit  Medication Sig Dispense Refill  . albuterol (PROVENTIL HFA;VENTOLIN HFA) 108 (90 Base) MCG/ACT inhaler Inhale into the lungs as needed.    . B-D ULTRA-FINE 33 LANCETS MISC Check BG 2 times/day    . glucose blood (ONE TOUCH ULTRA TEST) test strip Check BG 2 times/day.  Dx E11.65    . hydrochlorothiazide (HYDRODIURIL) 25 MG tablet Take 1 tablet (25 mg total) by mouth daily. 30 tablet 5  . metFORMIN (GLUCOPHAGE-XR) 500 MG 24 hr tablet Take 1,000 mg by mouth 2 (two) times daily.    . Multiple Vitamin (MULTIVITAMIN) LIQD Take 5 mLs by mouth daily.    . Empagliflozin (JARDIANCE PO) Take by  mouth.    Marland Kitchen FLUoxetine (PROZAC) 10 MG tablet TAKE 1 TABLET BY MOUTH DAILY 90 tablet 3  . oxyCODONE (OXY IR/ROXICODONE) 5 MG immediate release tablet Take 1-2 tablets (5-10 mg total) every 6 (six) hours as needed by mouth for moderate pain or severe pain. 15 tablet 0   No facility-administered medications prior to visit.     Allergies  Allergen Reactions  . Lisinopril Cough    With SOB and wheezing    Review of Systems  Constitutional: Positive for malaise/fatigue. Negative for fever.  HENT: Negative for congestion.   Eyes: Negative for blurred vision.  Respiratory: Negative for shortness of breath.   Cardiovascular: Negative for chest pain, palpitations and leg swelling.  Gastrointestinal: Negative for abdominal pain, blood in stool and nausea.  Genitourinary: Negative for dysuria and frequency.  Musculoskeletal: Negative for falls.  Skin: Negative for rash.  Neurological: Negative for dizziness, loss of consciousness and headaches.  Endo/Heme/Allergies: Negative for environmental allergies.  Psychiatric/Behavioral: Negative for depression. The patient is not nervous/anxious.        Objective:    Physical Exam  Constitutional: She is oriented to person, place, and time. She appears well-developed and well-nourished. No distress.  HENT:  Head: Normocephalic and atraumatic.  Nose: Nose normal.  Eyes: Right eye exhibits no discharge. Left eye exhibits no discharge.  Neck: Normal range of motion. Neck supple.  Cardiovascular: Normal rate and regular rhythm.  No murmur heard. Pulmonary/Chest: Effort normal and breath sounds normal.  Abdominal: Soft. Bowel sounds are normal. There is no tenderness.  Musculoskeletal: She exhibits no edema.  Neurological: She is alert and oriented to person, place, and time.  Skin: Skin is warm and dry.  Psychiatric: She has a normal mood and affect.  Nursing note and vitals reviewed.   BP 130/84 (BP Location: Left Arm, Patient Position:  Sitting, Cuff Size: Normal)   Pulse 74   Temp 98.1 F (36.7 C) (Oral)   Resp 18   Wt 186 lb 3.2 oz (84.5 kg)   LMP 02/21/2014 Comment: postmenopausal bleeding  SpO2 96%   BMI 32.98 kg/m  Wt Readings from Last 3 Encounters:  01/22/17 186 lb 3.2 oz (84.5 kg)  12/30/16 183 lb (83 kg)  12/12/15 190 lb (86.2 kg)   BP Readings from Last 3 Encounters:  01/22/17 130/84  12/30/16 (!) 165/90  12/12/15 118/78     Immunization History  Administered Date(s) Administered  . Pneumococcal Conjugate-13 06/10/2015  . Td 07/21/2006    Health Maintenance  Topic Date Due  . PNEUMOCOCCAL POLYSACCHARIDE VACCINE (1) 05/02/1961  . OPHTHALMOLOGY EXAM  05/02/1969  . HIV Screening  05/03/1974  . HEMOGLOBIN A1C  09/01/2015  . URINE MICROALBUMIN  03/03/2016  . FOOT EXAM  06/09/2016  . TETANUS/TDAP  07/20/2016  .  INFLUENZA VACCINE  09/24/2017 (Originally 09/16/2016)  . PAP SMEAR  09/06/2017  . MAMMOGRAM  10/21/2018  . COLONOSCOPY  11/03/2022  . Hepatitis C Screening  Completed    Lab Results  Component Value Date   WBC 8.4 12/28/2016   HGB 13.2 12/28/2016   HCT 42.1 12/28/2016   PLT 294 12/28/2016   GLUCOSE 127 (H) 12/28/2016   CHOL 179 03/04/2015   TRIG 84.0 03/04/2015   HDL 66.40 03/04/2015   LDLDIRECT 128.9 07/14/2006   LDLCALC 96 03/04/2015   ALT 46 (H) 03/04/2015   AST 27 03/04/2015   NA 138 12/28/2016   K 4.4 12/28/2016   CL 100 (L) 12/28/2016   CREATININE 0.62 12/28/2016   BUN 7 12/28/2016   CO2 31 12/28/2016   TSH 2.37 12/06/2015   HGBA1C 7.4 (H) 03/04/2015   MICROALBUR 0.7 03/04/2015    Lab Results  Component Value Date   TSH 2.37 12/06/2015   Lab Results  Component Value Date   WBC 8.4 12/28/2016   HGB 13.2 12/28/2016   HCT 42.1 12/28/2016   MCV 94.4 12/28/2016   PLT 294 12/28/2016   Lab Results  Component Value Date   NA 138 12/28/2016   K 4.4 12/28/2016   CO2 31 12/28/2016   GLUCOSE 127 (H) 12/28/2016   BUN 7 12/28/2016   CREATININE 0.62 12/28/2016    BILITOT 0.2 03/04/2015   ALKPHOS 126 (H) 03/04/2015   AST 27 03/04/2015   ALT 46 (H) 03/04/2015   PROT 7.4 03/04/2015   ALBUMIN 4.1 03/04/2015   CALCIUM 9.2 12/28/2016   ANIONGAP 7 12/28/2016   GFR 107.90 03/04/2015   Lab Results  Component Value Date   CHOL 179 03/04/2015   Lab Results  Component Value Date   HDL 66.40 03/04/2015   Lab Results  Component Value Date   LDLCALC 96 03/04/2015   Lab Results  Component Value Date   TRIG 84.0 03/04/2015   Lab Results  Component Value Date   CHOLHDL 3 03/04/2015   Lab Results  Component Value Date   HGBA1C 7.4 (H) 03/04/2015         Assessment & Plan:   Problem List Items Addressed This Visit    OBESITY    Encouraged DASH diet, decrease po intake and increase exercise as tolerated. Needs 7-8 hours of sleep nightly. Avoid trans fats, eat small, frequent meals every 4-5 hours with lean proteins, complex carbs and healthy fats. Minimize simple carbs.consider bariatric referral.       OBSTRUCTIVE SLEEP APNEA    Not using CPAP. Has been more than 5 years agrees to new consultation with new MD      Relevant Orders   Ambulatory referral to Pulmonology   Essential hypertension    Well controlled, no changes to meds. Encouraged heart healthy diet such as the DASH diet and exercise as tolerated.       Relevant Orders   CBC   Comprehensive metabolic panel   TSH   Diabetes ([place])    hgba1c acceptable, minimize simple carbs. Increase exercise as tolerated. fasting sugars ranging from 120 to 160. 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 Febles's FLUoxetine, Empagliflozin (JARDIANCE PO), and oxyCODONE. I am also having her  maintain her hydrochlorothiazide, metFORMIN, albuterol, glucose blood, B-D ULTRA-FINE 33 LANCETS, and multivitamin.  No orders of the defined types were placed in this encounter.   CMA served as Education administrator during this visit. History, Physical and Plan performed by medical provider. Documentation and orders reviewed and attested to.  Penni Homans, MD

## 2017-01-29 NOTE — Progress Notes (Signed)
Yolanda [name]  [phone] Fax:([phone]) [phone]     ID: Yolanda [name] DOB: 1960-08-01  MR#: [id number]  HCW#:[id number]  Patient Care Team: Mosie Lukes, MD as PCP - General (Family Medicine) Thornell Sartorius, MD as Consulting Physician (Otolaryngology) Michel Bickers, MD as Consulting Physician (Infectious Diseases) Megan Salon, MD as Consulting Physician (Gynecology) Christain Sacramento, English as Referring Physician (Optometry) Seth Bake, Danella Deis, MD (Family Medicine) Shawnie Dapper OTHER MD:  CHIEF COMPLAINT: breast cancer high risk  CURRENT TREATMENT: Considering antiestrogens   HISTORY OF CURRENT ILLNESS: The patient had bilateral screening mammography with tomography at the breast center 10/20/2016.  A possible area of distortion was noted in the breast.  On 10/27/2016 she underwent left breast diagnostic mammography with tomography and left breast ultrasonography.  The breast density was category C.  There was a persistent distortion in the left breast, which was not palpable.  Ultrasound showed only fibrocystic changes with no suspicious masses.  Accordingly biopsy was obtained October 28, 2016, this showed ([id number]) fibroadenoma.  However this was found to be discordant.  Excision was recommended, and after radioactive seed placement 12/29/2016, on 12/30/2016 the patient underwent left lumpectomy, with the pathology ([id number]) showing atypical lobular hyperplasia.  There was no evidence of malignancy.  The patient's subsequent history is as detailed below.  INTERVAL HISTORY: Yolanda [name] was evaluated in the breast clinic 02/01/2017 accompanied by her sister in law.  The patient did well with her lumpectomy, without unusual pain bleeding fever or other complications.   REVIEW OF SYSTEMS: Yolanda [name] reports that she is well overall. She reports that she has diabetes and cataracts. She notes that she might have asthma symtoms when she gets sick. She  notes that she felt heart palpatations once when she drank too much caffine. She notes that she has had a bunionectomy and gallbladder removal.  She denies unusual headaches, visual changes, nausea, vomiting, or dizziness. There has been no unusual cough, phlegm production, or pleurisy. This been no change in bowel or bladder habits. She denies unexplained fatigue or unexplained weight loss, bleeding, rash, or fever. A detailed review of systems was otherwise stable.    PAST MEDICAL HISTORY: Past Medical History:  Diagnosis Date  . Asthma    with respiratory infection  . Diabetes mellitus without complication ([place])    type II  . Fibroid uterus   . Hypertension   . Osteoarthritis 03/10/2015  . PONV (postoperative nausea and vomiting)    pt stopped breathing during colonoscopy  . Refusal of blood transfusions as patient is Jehovah's Witness    NO BLOOD PRODUCTS  . Sleep apnea     PAST SURGICAL HISTORY: Past Surgical History:  Procedure Laterality Date  . BREAST CYST ASPIRATION    . CHOLECYSTECTOMY  1/11  . COLONOSCOPY    . DILATATION & CURRETTAGE/HYSTEROSCOPY WITH RESECTOCOPE N/A 03/13/2014   Procedure: DILATATION & CURETTAGE/HYSTEROSCOPY ;  Surgeon: Lyman Speller, MD;  Location: [facility];  Service: Gynecology;  Laterality: N/A;  . RADIOACTIVE SEED GUIDED EXCISIONAL BREAST BIOPSY Left 12/30/2016   Procedure: RADIOACTIVE SEED GUIDED EXCISIONAL BREAST BIOPSY;  Surgeon: Stark Klein, MD;  Location: [facility];  Service: General;  Laterality: Left;    FAMILY HISTORY Family History  Problem Relation Age of Onset  . Asthma Mother   . Diabetes Mother   . Diabetes Father   . Heart attack Father   . Diabetes Brother   . Diabetes Brother   . Asthma  Brother   . Heart Problems Sister   . Asthma Sister   . Hypertension Sister   . Heart disease Maternal Grandfather   . Osteopenia Sister    She notes the her mother, 49, is lving. Her father was 3 when he died from  heart complications. She has 4 brothers and 2 sisters. She notes no history of cancer in the family.  GYNECOLOGIC HISTORY:  Patient's last menstrual period was 02/21/2014.  Menarche: [age]. GxP0.  She no longer has periods with her last one being 4 years ago. She ever took hormone replcement. She took birth control the 1st year of her marriage and denies complications from that.   SOCIAL HISTORY:  Yolanda [name] is originally from [place], and grew up in [place].  She does clerical work for a Administrator, [employer].  Her husband, Production assistant, radio, is a Marketing executive for [employer]. She is jehovah's witness.     ADVANCED DIRECTIVES:    HEALTH MAINTENANCE: Social History   Tobacco Use  . Smoking status: Never Smoker  . Smokeless tobacco: Never Used  Substance Use Topics  . Alcohol use: Yes    Alcohol/week: 0.0 oz    Comment: occ  . Drug use: No     Colonoscopy: last one completed by Dr. Young Berry  PAP:  Bone density: last completed 2015   Allergies  Allergen Reactions  . Lisinopril Cough    With SOB and wheezing    Current Outpatient Medications  Medication Sig Dispense Refill  . albuterol (PROVENTIL HFA;VENTOLIN HFA) 108 (90 Base) MCG/ACT inhaler Inhale into the lungs as needed.    . B-D ULTRA-FINE 33 LANCETS MISC Check BG 2 times/day    . Barberry-Oreg Grape-Goldenseal (BERBERINE COMPLEX PO) Take by mouth.    Marland Kitchen glucose blood (ONE TOUCH ULTRA TEST) test strip Check BG 2 times/day.  Dx E11.65    . hydrochlorothiazide (HYDRODIURIL) 25 MG tablet Take 1 tablet (25 mg total) by mouth daily. 30 tablet 5  . magnesium 30 MG tablet Take 30 mg by mouth daily.    . metFORMIN (GLUCOPHAGE-XR) 500 MG 24 hr tablet Take 1,000 mg by mouth 2 (two) times daily.    . Multiple Vitamin (MULTIVITAMIN) LIQD Take 5 mLs by mouth daily.     No current facility-administered medications for this visit.     OBJECTIVE: Middle-aged woman in no acute distress  Vitals:   02/01/17 1555  BP:  (!) 152/94  Pulse: 75  Resp: 18  Temp: (!) 97.3 F (36.3 C)  SpO2: 98%     Body mass index is 32.91 kg/m.   Wt Readings from Last 3 Encounters:  02/01/17 185 lb 12.8 oz (84.3 kg)  01/22/17 186 lb 3.2 oz (84.5 kg)  12/30/16 183 lb (83 kg)      ECOG FS:0 - Asymptomatic  Ocular: Sclerae unicteric, pupils round and equal Ear-nose-throat: Oropharynx clear and moist Lymphatic: No cervical or supraclavicular adenopathy Lungs no rales or rhonchi Heart regular rate and rhythm Abd soft, nontender, positive bowel sounds MSK no focal spinal tenderness, no joint edema Neuro: non-focal, well-oriented, appropriate affect Breasts: The right breast is benign.  The left breast is status post lumpectomy.  There is no dehiscence, erythema, or swelling.  Both axillae are benign.   LAB RESULTS:  CMP     Component Value Date/Time   NA 139 01/22/2017 0841   K 4.0 01/22/2017 0841   CL 100 01/22/2017 0841   CO2 31 01/22/2017 0841   GLUCOSE 104 (  H) 01/22/2017 0841   BUN 15 01/22/2017 0841   CREATININE 0.59 01/22/2017 0841   CREATININE 0.60 09/07/2014 0950   CALCIUM 9.6 01/22/2017 0841   PROT 7.1 01/22/2017 0841   ALBUMIN 4.3 01/22/2017 0841   AST 36 01/22/2017 0841   ALT 58 (H) 01/22/2017 0841   ALKPHOS 127 (H) 01/22/2017 0841   BILITOT 0.3 01/22/2017 0841   GFRNONAA >60 12/28/2016 0800   GFRAA >60 12/28/2016 0800    No results found for: TOTALPROTELP, ALBUMINELP, A1GS, A2GS, BETS, BETA2SER, GAMS, MSPIKE, SPEI  No results found for: KPAFRELGTCHN, LAMBDASER, [facility]  Lab Results  Component Value Date   WBC 6.6 01/22/2017   NEUTROABS 5.2 10/16/2009   HGB 12.9 01/22/2017   HCT 40.7 01/22/2017   MCV 93.5 01/22/2017   PLT 277.0 01/22/2017    @LASTCHEMISTRY @  No results found for: LABCA2  No components found for: HDQQIW979  No results for input(s): INR in the last 168 hours.  No results found for: LABCA2  No results found for: GXQ119  No results found for:  ERD408  No results found for: XKG818  No results found for: CA2729  No components found for: HGQUANT  No results found for: CEA1 / No results found for: CEA1   No results found for: AFPTUMOR  No results found for: CHROMOGRNA  No results found for: PSA1  No visits with results within 3 Day(s) from this visit.  Latest known visit with results is:  Office Visit on 01/22/2017  Component Date Value Ref Range Status  . Hgb A1c MFr Bld 01/22/2017 7.2* 4.6 - 6.5 % Final   Glycemic Control Guidelines for People with Diabetes:Non Diabetic:  <6%Goal of Therapy: <7%Additional Action Suggested:  >8%   . WBC 01/22/2017 6.6  4.0 - 10.5 K/uL Final  . RBC 01/22/2017 4.35  3.87 - 5.11 Mil/uL Final  . Platelets 01/22/2017 277.0  150.0 - 400.0 K/uL Final  . Hemoglobin 01/22/2017 12.9  12.0 - 15.0 g/dL Final  . HCT 01/22/2017 40.7  36.0 - 46.0 % Final  . MCV 01/22/2017 93.5  78.0 - 100.0 fl Final  . MCHC 01/22/2017 31.8  30.0 - 36.0 g/dL Final  . RDW 01/22/2017 14.6  11.5 - 15.5 % Final  . Sodium 01/22/2017 139  135 - 145 mEq/L Final  . Potassium 01/22/2017 4.0  3.5 - 5.1 mEq/L Final  . Chloride 01/22/2017 100  96 - 112 mEq/L Final  . CO2 01/22/2017 31  19 - 32 mEq/L Final  . Glucose, Bld 01/22/2017 104* 70 - 99 mg/dL Final  . BUN 01/22/2017 15  6 - 23 mg/dL Final  . Creatinine, Ser 01/22/2017 0.59  0.40 - 1.20 mg/dL Final  . Total Bilirubin 01/22/2017 0.3  0.2 - 1.2 mg/dL Final  . Alkaline Phosphatase 01/22/2017 127* 39 - 117 U/L Final  . AST 01/22/2017 36  0 - 37 U/L Final  . ALT 01/22/2017 58* 0 - 35 U/L Final  . Total Protein 01/22/2017 7.1  6.0 - 8.3 g/dL Final  . Albumin 01/22/2017 4.3  3.5 - 5.2 g/dL Final  . Calcium 01/22/2017 9.6  8.4 - 10.5 mg/dL Final  . GFR 01/22/2017 111.38  >60.00 mL/min Final  . Cholesterol 01/22/2017 175  0 - 200 mg/dL Final   ATP III Classification       Desirable:  < 200 mg/dL               Borderline High:  200 - 239 mg/dL  High:  > = 240 mg/dL  .  Triglycerides 01/22/2017 144.0  0.0 - 149.0 mg/dL Final   Normal:  <150 mg/dLBorderline High:  150 - 199 mg/dL  . HDL 01/22/2017 61.40  >39.00 mg/dL Final  . VLDL 01/22/2017 28.8  0.0 - 40.0 mg/dL Final  . LDL Cholesterol 01/22/2017 84  0 - 99 mg/dL Final  . Total CHOL/HDL Ratio 01/22/2017 3   Final                  Men          Women1/2 Average Risk     3.4          3.3Average Risk          5.0          4.42X Average Risk          9.6          7.13X Average Risk          15.0          11.0                      . NonHDL 01/22/2017 113.27   Final   NOTE:  Non-HDL goal should be 30 mg/dL higher than patient's LDL goal (i.e. LDL goal of < 70 mg/dL, would have non-HDL goal of < 100 mg/dL)  . TSH 01/22/2017 2.82  0.35 - 4.50 uIU/mL Final    (this displays the last labs from the last 3 days)  No results found for: TOTALPROTELP, ALBUMINELP, A1GS, A2GS, BETS, BETA2SER, GAMS, MSPIKE, SPEI (this displays SPEP labs)  No results found for: KPAFRELGTCHN, LAMBDASER, KAPLAMBRATIO (kappa/lambda light chains)  No results found for: HGBA, HGBA2QUANT, HGBFQUANT, HGBSQUAN (Hemoglobinopathy evaluation)   No results found for: LDH  Lab Results  Component Value Date   IRON 38 (L) 10/16/2009   IRONPCTSAT 10.2 (L) 10/16/2009   (Iron and TIBC)  Lab Results  Component Value Date   FERRITIN 8.9 (L) 10/16/2009    Urinalysis    Component Value Date/Time   COLORURINE YELLOW 03/11/2009 1100   APPEARANCEUR CLEAR 03/11/2009 1100   LABSPEC 1.017 03/11/2009 1100   PHURINE 7.0 03/11/2009 1100   GLUCOSEU NEGATIVE 03/11/2009 1100   HGBUR LARGE (A) 03/11/2009 1100   BILIRUBINUR N 05/21/2015 1614   KETONESUR NEGATIVE 03/11/2009 1100   PROTEINUR N 05/21/2015 1614   PROTEINUR NEGATIVE 03/11/2009 1100   UROBILINOGEN negative 05/21/2015 1614   UROBILINOGEN 0.2 03/11/2009 1100   NITRITE N 05/21/2015 1614   NITRITE NEGATIVE 03/11/2009 1100   LEUKOCYTESUR Negative 05/21/2015 1614     STUDIES: Outside  studies discussed with the patient  ELIGIBLE FOR AVAILABLE RESEARCH PROTOCOL: no  ASSESSMENT: 56 y.o. [place] woman status post left lumpectomy 12/30/2016 showing atypical lobular hyperplasia  PLAN: We spent the better part of today's 50-minute appointment discussing the biology of breast cancer in general, and the specifics of atypical lobular hyperplasia [ALH] more specifically.  Yolanda [name] understands ALH means, first, relatively unrestricted growth of breast cells and, in addition, some morphologically different features from simple hyperplasia. Atypical lobular hyperplasia is not breast cancer but can be associated with invasive cancer and for that reason she had to undergo surgery.  Luckily in her case no invasive disease was noted. However Yolanda [name] is a marker of breast cancer risk. The risk of developing breast cancer in patients with ALH approaches 1% per year. The new cancer can develop in either breast. One half  of those tumors would be invasive.  Working from the patient's age to the average survival of women today in the [place] I think Yolanda [name] can expect to live an additional 30 years of risk. Using the high end of the risk spectrum for purposes of discussion, this would give her a 30% chance of developing breast cancer in her lifetime. This is approximately 3 times the normal risk.  Cashlyn has essentially two ways of lowering that risk. One of them is bilateral mastectomies. This is not recommended for this indication and I do not believe it would be covered by insurance even if she wanted it which she doesn't. The other, more reasonable approach would be to take anti-estrogens. This could be tamoxifen, raloxifene/Evista, or an aromatase inhibitor  We then discussed the possible toxicities, side effects and complications of the tamoxifen group as opposed to the aromatase inhibitors. All information was given to Yolanda [name] in writing. Any of those agents if taken for 5 years would essentially cut the risk of  developing a new breast cancer in half.  We also discussed intensified screening. This would be adding yearly MRI to yearly mammography with tomography. This approach greatly increases sensitivity. Any breast cancer that may develop should be found at the earliest possible stage. However intensified screening has not been documented to improve survival in this population. There are issues regarding cost even if insurance coverage is obtained. There also concerns regarding false positives especially in the first or second year of MRI screening.  Emaly has a good understanding of all these options. At a minimum she understands she needs to have yearly mammography with tomography and a yearly or biannual physician breast exam.   As for the additional options just discussed, she could not decide whether she wanted to pursue them further or not. If she decides to try and anti-estrogens she will let me know, I will be glad to prescribe it for her, and I would see her after a few months to make sure she is tolerating it well.   If she decides against trying antiestrogens then further follow-up here would not be needed.  Accordingly at this point no return appointment has been scheduled. I will be available to Yolanda [name] in the future if any further questions or concerns regarding breast cancer were to develop.   Yolanda [name], Yolanda Dad, MD  02/01/17 4:46 PM Medical Oncology and Hematology [facility] [address] [place], [place] [address] Tel. [phone]    Fax. [phone]  This document serves as a record of services personally performed by Lurline Del, MD. It was created on his behalf by Sheron Nightingale, a trained medical scribe. The creation of this record is based on the scribe's personal observations and the provider's statements to them.   I have reviewed the above documentation for accuracy and completeness, and I agree with the above.

## 2017-02-01 ENCOUNTER — Ambulatory Visit (HOSPITAL_BASED_OUTPATIENT_CLINIC_OR_DEPARTMENT_OTHER): Payer: BLUE CROSS/BLUE SHIELD | Admitting: Oncology

## 2017-02-01 DIAGNOSIS — N6092 Unspecified benign mammary dysplasia of left breast: Secondary | ICD-10-CM

## 2017-02-03 ENCOUNTER — Telehealth: Payer: Self-pay | Admitting: Oncology

## 2017-02-03 NOTE — Telephone Encounter (Signed)
No 12/17 los °

## 2017-03-18 ENCOUNTER — Institutional Professional Consult (permissible substitution): Payer: BLUE CROSS/BLUE SHIELD | Admitting: Pulmonary Disease

## 2017-03-18 NOTE — Progress Notes (Signed)
57 y.o. G0P0 MarriedCaucasianF here for annual exam.  Had atypical lobular hyperplasia that was diagnosed in the fall.  Had lumpectomy.  Saw Dr. Jana Hakim.  Has decided again anti-estrogen therapy.  Have discussed this with pt today.  I disagree with her decision and have asked her ot reconsider this.  She feels confident in her decison.  MRI discussed.  Would do this alternately with her MMG.  She is claustrophobic but would consider open MRI.  Wants to "pray on this".    Denies vaginal bleeding.    Last HbA1C was 6.8.  Saw endocrinology last Friday.  Has resigned from work.  "taking a break".      Stopped anti-depressant.  Decided she does not need it.  Patient's last menstrual period was 02/21/2014.          Sexually active: No.  The current method of family planning is post menopausal status.    Exercising: No.  exercise Smoker:  no  Health Maintenance: Pap:  09/07/14 negative  History of abnormal Pap:  no MMG:  Bilateral 9/18, breast biopsy done left side Colonoscopy:  2014 f/u 65yrs BMD:   2015 TDaP:  2018 Pneumonia vaccine(s):  2017 Shingrix:   Not done Hep C testing: Hep c neg 2017 Screening Labs: PCP   reports that  has never smoked. she has never used smokeless tobacco. She reports that she does not drink alcohol or use drugs.  Past Medical History:  Diagnosis Date  . Asthma    with respiratory infection  . Diabetes mellitus without complication ([place])    type II  . Fibroid uterus   . Hypertension   . Osteoarthritis 03/10/2015  . PONV (postoperative nausea and vomiting)    pt stopped breathing during colonoscopy  . Refusal of blood transfusions as patient is Jehovah's Witness    NO BLOOD PRODUCTS  . Sleep apnea     Past Surgical History:  Procedure Laterality Date  . BREAST CYST ASPIRATION    . CHOLECYSTECTOMY  1/11  . COLONOSCOPY    . DILATATION & CURRETTAGE/HYSTEROSCOPY WITH RESECTOCOPE N/A 03/13/2014   Procedure: DILATATION & CURETTAGE/HYSTEROSCOPY ;   Surgeon: Lyman Speller, MD;  Location: [facility];  Service: Gynecology;  Laterality: N/A;  . RADIOACTIVE SEED GUIDED EXCISIONAL BREAST BIOPSY Left 12/30/2016   Procedure: RADIOACTIVE SEED GUIDED EXCISIONAL BREAST BIOPSY;  Surgeon: Stark Klein, MD;  Location: [facility];  Service: General;  Laterality: Left;    Current Outpatient Medications  Medication Sig Dispense Refill  . albuterol (PROVENTIL HFA;VENTOLIN HFA) 108 (90 Base) MCG/ACT inhaler Inhale into the lungs as needed.    . B-D ULTRA-FINE 33 LANCETS MISC Check BG 2 times/day    . Barberry-Oreg Grape-Goldenseal (BERBERINE COMPLEX PO) Take by mouth.    Marland Kitchen glucose blood (ONE TOUCH ULTRA TEST) test strip Check BG 2 times/day.  Dx E11.65    . metFORMIN (GLUCOPHAGE-XR) 500 MG 24 hr tablet Take 1,000 mg by mouth 2 (two) times daily.    . Multiple Vitamin (MULTIVITAMIN) LIQD Take 5 mLs by mouth daily.    . hydrochlorothiazide (HYDRODIURIL) 25 MG tablet Take 1 tablet (25 mg total) by mouth daily. (Patient not taking: Reported on 03/19/2017) 30 tablet 5   No current facility-administered medications for this visit.     Family History  Problem Relation Age of Onset  . Asthma Mother   . Diabetes Mother   . Diabetes Father   . Heart attack Father   . Diabetes Brother   .  Diabetes Brother   . Asthma Brother   . Heart Problems Sister   . Asthma Sister   . Hypertension Sister   . Heart disease Maternal Grandfather   . Osteopenia Sister     ROS:  Pertinent items are noted in HPI.  Otherwise, a comprehensive ROS was negative.  Exam:   BP 122/86   Pulse 68   Resp 16   Ht 5' 2.75" (1.594 m)   Wt 181 lb (82.1 kg)   LMP 02/21/2014   BMI 32.32 kg/m   Weight change: -8#   Height: 5' 2.75" (159.4 cm)  Ht Readings from Last 3 Encounters:  03/19/17 5' 2.75" (1.594 m)  02/01/17 5\' 3"  (1.6 m)  12/30/16 5\' 3"  (1.6 m)    General appearance: alert, cooperative and appears stated age Head: Normocephalic, without obvious  abnormality, atraumatic Neck: no adenopathy, supple, symmetrical, trachea midline and thyroid normal to inspection and palpation Lungs: clear to auscultation bilaterally Breasts: normal appearance, no masses or tenderness, pendulous, well healed scar left breast Heart: regular rate and rhythm Abdomen: soft, non-tender; bowel sounds normal; no masses,  no organomegaly Extremities: extremities normal, atraumatic, no cyanosis or edema Skin: Skin color, texture, turgor normal. No rashes or lesions Lymph nodes: Cervical, supraclavicular, and axillary nodes normal. No abnormal inguinal nodes palpated Neurologic: Grossly normal   Pelvic: External genitalia:  no lesions              Urethra:  normal appearing urethra with no masses, tenderness or lesions              Bartholins and Skenes: normal                 Vagina: normal appearing vagina with normal color and discharge, no lesions              Cervix: no lesions              Pap taken: Yes.   Bimanual Exam:  Uterus:  About 8 weeks in size, mobile              Adnexa: normal adnexa and no mass, fullness, tenderness               Rectovaginal: Confirms               Anus:  normal sphincter tone, no lesions  Chaperone was present for exam.  A:  Well Woman with normal exam PMP, no HRT H/O fibroid uteru Hypertension Diabetes, on Glucophage H/o hysteroscopy with polyp resection 1/16 Atypical lobular hyperplasia Jehovah's witness Vaginal dryness  P:   Mammogram guidelines reviewed.  Discussed with pt yearly MRIs as well as she has declined anti-estrogens.  I really feel that increased surveillance is important.  She states she will consider this and let me know. Pap and HR HPV obtained today Lab work and vaccines is currently UTD return annually or prn

## 2017-03-19 ENCOUNTER — Other Ambulatory Visit: Payer: Self-pay

## 2017-03-19 ENCOUNTER — Encounter: Payer: Self-pay | Admitting: Obstetrics & Gynecology

## 2017-03-19 ENCOUNTER — Ambulatory Visit (INDEPENDENT_AMBULATORY_CARE_PROVIDER_SITE_OTHER): Payer: 59 | Admitting: Obstetrics & Gynecology

## 2017-03-19 ENCOUNTER — Other Ambulatory Visit (HOSPITAL_COMMUNITY)
Admission: RE | Admit: 2017-03-19 | Discharge: 2017-03-19 | Disposition: A | Discharge status: Home / Self Care | Payer: BLUE CROSS/BLUE SHIELD | Source: Ambulatory Visit | Attending: Obstetrics & Gynecology | Admitting: Obstetrics & Gynecology

## 2017-03-19 VITALS — BP 122/86 | HR 68 | Resp 16 | Ht 62.75 in | Wt 181.0 lb

## 2017-03-19 DIAGNOSIS — Z01419 Encounter for gynecological examination (general) (routine) without abnormal findings: Secondary | ICD-10-CM

## 2017-03-19 DIAGNOSIS — Z124 Encounter for screening for malignant neoplasm of cervix: Secondary | ICD-10-CM | POA: Diagnosis not present

## 2017-03-23 LAB — CYTOLOGY - PAP
Diagnosis: NEGATIVE
HPV: NOT DETECTED

## 2017-03-25 ENCOUNTER — Institutional Professional Consult (permissible substitution): Payer: BLUE CROSS/BLUE SHIELD | Admitting: Pulmonary Disease

## 2017-04-15 ENCOUNTER — Ambulatory Visit (INDEPENDENT_AMBULATORY_CARE_PROVIDER_SITE_OTHER): Payer: BLUE CROSS/BLUE SHIELD | Admitting: Pulmonary Disease

## 2017-04-15 ENCOUNTER — Encounter: Payer: Self-pay | Admitting: Pulmonary Disease

## 2017-04-15 VITALS — BP 151/94 | HR 83 | Ht 63.0 in | Wt 185.4 lb

## 2017-04-15 DIAGNOSIS — G4733 Obstructive sleep apnea (adult) (pediatric): Secondary | ICD-10-CM | POA: Diagnosis not present

## 2017-04-15 DIAGNOSIS — J309 Allergic rhinitis, unspecified: Secondary | ICD-10-CM | POA: Diagnosis not present

## 2017-04-15 DIAGNOSIS — J342 Deviated nasal septum: Secondary | ICD-10-CM | POA: Diagnosis not present

## 2017-04-15 DIAGNOSIS — J351 Hypertrophy of tonsils: Secondary | ICD-10-CM

## 2017-04-15 MED ORDER — FLUTICASONE PROPIONATE 50 MCG/ACT NA SUSP: 1.0000 | Freq: Every day | NASAL | 2 refills | Status: DC

## 2017-04-15 NOTE — Patient Instructions (Signed)
Continue saline nasal spray daily Flonase 1 spray in each nostril daily You can use over the counter antihistamine pill daily for one week, then as needed Will arrange for home sleep study Will call to arrange for follow up after sleep study reviewed

## 2017-04-15 NOTE — Progress Notes (Signed)
[facility]  Chief Complaint  Patient presents with  . sleep consult    Pt referred by Dr. Charlett Blake MD. Pt had sleep study over 78yrs ago, diagnosis with severe sleep apnea had a cpap machine could not use it. Pt tried several masks, didn't work well for pt, she stopped using cpap machine 4 yrs ago.    Vital signs: BP (!) 151/94 (BP Location: Left Arm, Cuff Size: Normal)   Pulse 83   Ht 5\' 3"  (1.6 m)   Wt 185 lb 6.4 oz (84.1 kg)   LMP 02/21/2014   SpO2 96%   BMI 32.84 kg/m   History of Present Illness: Yolanda [name] is a 57 y.o. female for evaluation of sleep problems.  She had sleep study in 2010.  She was found to have severe sleep apnea.  She hasn't used CPAP in years.  She was seen recently by her PCP.  She was noted to have more fatigue, and advised that she should have follow up sleep assessment.  Her husband says she snores, and will stop breathing.  This is worse on her back.  She gets tired in the afternoon.  She tried full face mask when she used CPAP.  Her main issue was getting a moldy smell from machine no matter how often she cleaned it.  She is also claustrophobic and concerned about wearing anything on her face although she does wear glasses.  She goes to sleep at 10 pm.  She falls asleep within minutes.  She wakes up 1 or 2 times to use the bathroom.  She gets out of bed at 7 am.  She feels tired in the morning.  She denies morning headache.  She does not use anything to help her fall sleep or stay awake.  She denies sleep walking, sleep talking, bruxism, or nightmares.  There is no history of restless legs.  She denies sleep hallucinations, sleep paralysis, or cataplexy.  The Epworth score is 6 out of 24.  She has history of nasal septal deviation and tonsillar hypertrophy.  She gets chronic sinus congestion and post nasal drip.  She was seen by Dr. Ernesto Rutherford with ENT before.  She uses nasal irrigation daily.  Hasn't been on any  other therapy for her sinuses.   Physical Exam:  General - pleasant Eyes - pupils reactive ENT - no sinus tenderness, no oral exudate, no LAN, nasal septal deviation to right, 4+ tonsils Cardiac - regular, no murmur Chest - no wheeze, rales Abd - soft, non tender Ext - no edema Skin - no rashes Neuro - normal strength Psych - normal mood  Discussion: She has snoring, sleep disruption, witnessed apnea, and daytime sleepiness.  She has history of severe sleep apnea.  She also has history of hypertension and diabetes.  I am almost certain she has sleep apnea still.  We discussed how sleep apnea can affect various health problems, including risks for hypertension, cardiovascular disease, and diabetes.  We also discussed how sleep disruption can increase risks for accidents, such as while driving.  Weight loss as a means of improving sleep apnea was also reviewed.  Additional treatment options discussed were CPAP therapy, oral appliance, and surgical intervention.  She has history of allergies, chronic sinus congestion, nasal septal deviation and tonsillar hypertrophy.  These could be contributing to her issues with her sleep.  Assessment/Plan:  Obstructive sleep apnea. - will arrange for home sleep study - assuming she has sleep apnea,  asked her to keep an open mind about CPAP therapy; explained that there are newer mask options and different options for cleaning CPAP machines to avoid smells  Allergic rhinitis with nasal septal deviation and tonsillar hypertrophy. - will have her continue nasal irrigation and try flonase - she can use OTC antihistamine for one week, then prn - she might be a candidate for surgical intervention of her OSA >> she was seen previously by Dr. Ernesto Rutherford   Patient Instructions  Continue saline nasal spray daily Flonase 1 spray in each nostril daily You can use over the counter antihistamine pill daily for one week, then as needed Will arrange for home sleep  study Will call to arrange for follow up after sleep study reviewed     Chesley Mires, MD [place] 04/15/2017, 10:31 AM Pager:  [phone]  Flow Sheet  Sleep tests: PSG 04/18/08 >> AHI 32, SpO2 low 59%  Review of Systems: Constitutional: Negative for fever and unexpected weight change.  HENT: Negative for congestion, dental problem, ear pain, nosebleeds, postnasal drip, rhinorrhea, sinus pressure, sneezing, sore throat and trouble swallowing.   Eyes: Negative for redness and itching.  Respiratory: Positive for shortness of breath and wheezing. Negative for cough and chest tightness.   Cardiovascular: Negative for palpitations and leg swelling.  Gastrointestinal: Negative for nausea and vomiting.  Genitourinary: Negative for dysuria.  Musculoskeletal: Negative for joint swelling.  Skin: Negative for rash.  Allergic/Immunologic: Positive for food allergies. Negative for environmental allergies and immunocompromised state.  Neurological: Negative for headaches.  Hematological: Does not bruise/bleed easily.  Psychiatric/Behavioral: Negative for dysphoric mood. The patient is not nervous/anxious.    Past Medical History: She  has a past medical history of Asthma, Diabetes mellitus without complication ([place]), Fibroid uterus, Hypertension, Osteoarthritis (03/10/2015), PONV (postoperative nausea and vomiting), Refusal of blood transfusions as patient is Jehovah's Witness, and Sleep apnea.  Past Surgical History: She  has a past surgical history that includes Cholecystectomy (1/11); Colonoscopy; Dilatation & currettage/hysteroscopy with resectoscope (N/A, 03/13/2014); Breast cyst aspiration; and Radioactive seed guided excisional breast biopsy (Left, 12/30/2016).  Family History: Her family history includes Asthma in her brother, mother, and sister; Diabetes in her brother, brother, father, and mother; Heart Problems in her sister; Heart attack in her father; Heart  disease in her maternal grandfather; Hypertension in her sister; Osteopenia in her sister.  Social History: She  reports that  has never smoked. she has never used smokeless tobacco. She reports that she does not drink alcohol or use drugs.  Medications: Allergies as of 04/15/2017      Reactions   Lisinopril Cough   With SOB and wheezing      Medication List        Accurate as of 04/15/17 10:31 AM. Always use your most recent med list.          albuterol 108 (90 Base) MCG/ACT inhaler Commonly known as:  PROVENTIL HFA;VENTOLIN HFA Inhale into the lungs as needed.   B-D ULTRA-FINE 33 LANCETS Misc Check BG 2 times/day   BERBERINE COMPLEX PO Take by mouth.   fluticasone 50 MCG/ACT nasal spray Commonly known as:  FLONASE Place 1 spray into both nostrils daily.   hydrochlorothiazide 25 MG tablet Commonly known as:  HYDRODIURIL Take 1 tablet (25 mg total) by mouth daily.   metFORMIN 500 MG 24 hr tablet Commonly known as:  GLUCOPHAGE-XR Take 1,000 mg by mouth 2 (two) times daily.   multivitamin Liqd Take 5 mLs by mouth daily.  ONE TOUCH ULTRA TEST test strip Generic drug:  glucose blood Check BG 2 times/day.  Dx E11.65

## 2017-04-15 NOTE — Progress Notes (Signed)
   Subjective:    Patient ID: Yolanda [name], female    DOB: 12/13/1960, 57 y.o.   MRN: [id number]  HPI    Review of Systems  Constitutional: Negative for fever and unexpected weight change.  HENT: Negative for congestion, dental problem, ear pain, nosebleeds, postnasal drip, rhinorrhea, sinus pressure, sneezing, sore throat and trouble swallowing.   Eyes: Negative for redness and itching.  Respiratory: Positive for shortness of breath and wheezing. Negative for cough and chest tightness.   Cardiovascular: Negative for palpitations and leg swelling.  Gastrointestinal: Negative for nausea and vomiting.  Genitourinary: Negative for dysuria.  Musculoskeletal: Negative for joint swelling.  Skin: Negative for rash.  Allergic/Immunologic: Positive for food allergies. Negative for environmental allergies and immunocompromised state.  Neurological: Negative for headaches.  Hematological: Does not bruise/bleed easily.  Psychiatric/Behavioral: Negative for dysphoric mood. The patient is not nervous/anxious.        Objective:   Physical Exam        Assessment & Plan:

## 2017-05-13 DIAGNOSIS — G4733 Obstructive sleep apnea (adult) (pediatric): Secondary | ICD-10-CM

## 2017-05-14 ENCOUNTER — Other Ambulatory Visit: Payer: Self-pay | Admitting: *Deleted

## 2017-05-14 DIAGNOSIS — G4733 Obstructive sleep apnea (adult) (pediatric): Secondary | ICD-10-CM

## 2017-05-18 ENCOUNTER — Telehealth: Payer: Self-pay | Admitting: Pulmonary Disease

## 2017-05-18 DIAGNOSIS — G4733 Obstructive sleep apnea (adult) (pediatric): Secondary | ICD-10-CM

## 2017-05-18 NOTE — Telephone Encounter (Signed)
HST 05/13/17 >> AHI 23.1, SaO2 low 71%   Will have my nurse inform pt that sleep study shows moderate sleep apnea.  Options are 1) CPAP now, 2) ROV first.  If She is agreeable to CPAP, then please send order for auto CPAP range 5 to 15 cm H2O with heated humidity and mask of choice.  Have download sent 1 month after starting CPAP and set up ROV 2 months after starting CPAP.  ROV can be with me or NP.

## 2017-05-19 NOTE — Telephone Encounter (Signed)
Called patient with results/ Recs stated by MD.Sood. She has requested to have an appointment with a doctor to review results and cpap use. She was scheduled for a appointment with TP on 05/27/17.

## 2017-05-27 ENCOUNTER — Ambulatory Visit (INDEPENDENT_AMBULATORY_CARE_PROVIDER_SITE_OTHER): Payer: 59 | Admitting: Adult Health

## 2017-05-27 ENCOUNTER — Encounter: Payer: Self-pay | Admitting: Adult Health

## 2017-05-27 DIAGNOSIS — G4733 Obstructive sleep apnea (adult) (pediatric): Secondary | ICD-10-CM | POA: Diagnosis not present

## 2017-05-27 DIAGNOSIS — E669 Obesity, unspecified: Secondary | ICD-10-CM

## 2017-05-27 NOTE — Assessment & Plan Note (Signed)
Moderate OSA -discussed treatment options.  Patient would like to proceed with CPAP.  Plan  Patient Instructions  Begin CPAP At bedtime  , goal is to wear each night for at least 4-6hr .  Work on healthy weight .  Do not drive if sleepy  Order for dream wear nasal and full face  Follow up with Dr. Halford Chessman  Or Parrett NP in 2 months and As needed

## 2017-05-27 NOTE — Progress Notes (Signed)
Reviewed and agree with assessment/plan.   Makira Holleman, MD  Pulmonary/Critical Care 02/12/2016, 12:24 PM Pager:  [phone]  

## 2017-05-27 NOTE — Addendum Note (Signed)
Addended by: Parke Poisson E on: 05/27/2017 11:18 AM   Modules accepted: Orders

## 2017-05-27 NOTE — Progress Notes (Signed)
@Patient  ID: Yolanda [name], female    DOB: 1960-12-13, 57 y.o.   MRN: [id number]  Chief Complaint  Patient presents with  . Follow-up    OSA     Referring provider: Mosie Lukes, MD  HPI: 57 yo female followed for OSA   TEST  PSG 04/18/08 >> AHI 32, SpO2 low 59% HST 04/2017 >AHI 23/hr , SaO2 low 71%  05/27/2017 Follow up: OSA  Patient returns for a follow-up for sleep apnea.  Patient had previously been diagnosed with severe sleep apnea in the past.  But was unable to tolerate CPAP.  She returned in February for sleep consult.  Patient was set up for a home sleep study that showed moderate sleep apnea. We discussed her test results and possible treatment options including weight loss, oral appliance, and CPAP. She would like to proceed with CPAP . Wants to try dream wear nasal and full face .     Allergies  Allergen Reactions  . Lisinopril Cough    With SOB and wheezing    Immunization History  Administered Date(s) Administered  . Pneumococcal Conjugate-13 06/10/2015  . Td 07/21/2006  . Tdap 01/22/2017    Past Medical History:  Diagnosis Date  . Asthma    with respiratory infection  . Diabetes mellitus without complication ([place])    type II  . Fibroid uterus   . Hypertension   . Osteoarthritis 03/10/2015  . PONV (postoperative nausea and vomiting)    pt stopped breathing during colonoscopy  . Refusal of blood transfusions as patient is Jehovah's Witness    NO BLOOD PRODUCTS  . Sleep apnea     Tobacco History: Social History   Tobacco Use  Smoking Status Never Smoker  Smokeless Tobacco Never Used   Counseling given: Not Answered   Outpatient Encounter Medications as of 05/27/2017  Medication Sig  . albuterol (PROVENTIL HFA;VENTOLIN HFA) 108 (90 Base) MCG/ACT inhaler Inhale into the lungs as needed.  . B-D ULTRA-FINE 33 LANCETS MISC Check BG 2 times/day  . Barberry-Oreg Grape-Goldenseal (BERBERINE COMPLEX PO) Take by mouth.  . fluticasone (FLONASE)  50 MCG/ACT nasal spray Place 1 spray into both nostrils daily.  Marland Kitchen glucose blood (ONE TOUCH ULTRA TEST) test strip Check BG 2 times/day.  Dx E11.65  . hydrochlorothiazide (HYDRODIURIL) 25 MG tablet Take 1 tablet (25 mg total) by mouth daily.  . metFORMIN (GLUCOPHAGE-XR) 500 MG 24 hr tablet Take 1,000 mg by mouth 2 (two) times daily.  . Multiple Vitamin (MULTIVITAMIN) LIQD Take 5 mLs by mouth daily.   No facility-administered encounter medications on file as of 05/27/2017.      Review of Systems  Constitutional:   No  weight loss, night sweats,  Fevers, chills, + fatigue, or  lassitude.  HEENT:   No headaches,  Difficulty swallowing,  Tooth/dental problems, or  Sore throat,                No sneezing, itching, ear ache, nasal congestion, post nasal drip,   CV:  No chest pain,  Orthopnea, PND, swelling in lower extremities, anasarca, dizziness, palpitations, syncope.   GI  No heartburn, indigestion, abdominal pain, nausea, vomiting, diarrhea, change in bowel habits, loss of appetite, bloody stools.   Resp:    No chest wall deformity  Skin: no rash or lesions.  GU: no dysuria, change in color of urine, no urgency or frequency.  No flank pain, no hematuria   MS:  No joint pain or swelling.  No  decreased range of motion.  No back pain.    Physical Exam  BP 136/80 (BP Location: Right Arm, Patient Position: Sitting, Cuff Size: Normal)   Pulse 78   Ht 5\' 3"  (1.6 m)   Wt 183 lb 3.2 oz (83.1 kg)   LMP 02/21/2014   SpO2 95%   BMI 32.45 kg/m   GEN: A/Ox3; pleasant , NAD obese    HEENT:  [place]/AT,  EACs-clear, TMs-wnl, NOSE-clear, THROAT-clear, no lesions, no postnasal drip or exudate noted. Class 2 MP airway , tonsils 2+  NECK:  Supple w/ fair ROM; no JVD; normal carotid impulses w/o bruits; no thyromegaly or nodules palpated; no lymphadenopathy.    RESP  Clear  P & A; w/o, wheezes/ rales/ or rhonchi. no accessory muscle use, no dullness to percussion  CARD:  RRR, no m/r/g, no  peripheral edema, pulses intact, no cyanosis or clubbing.  GI:   Soft & nt; nml bowel sounds; no organomegaly or masses detected.   Musco: Warm bil, no deformities or joint swelling noted.   Neuro: alert, no focal deficits noted.    Skin: Warm, no lesions or rashes    Lab Results:  CBC  BMET  BNP No results found for: BNP  ProBNP No results found for: PROBNP  Imaging: No results found.   Assessment & Plan:   OBSTRUCTIVE SLEEP APNEA Moderate OSA -discussed treatment options.  Patient would like to proceed with CPAP.  Plan  Patient Instructions  Begin CPAP At bedtime  , goal is to wear each night for at least 4-6hr .  Work on healthy weight .  Do not drive if sleepy  Order for dream wear nasal and full face  Follow up with Dr. Halford Chessman  Or Parrett NP in 2 months and As needed        OBESITY Wt loss      Rexene Edison, NP 05/27/2017

## 2017-05-27 NOTE — Patient Instructions (Signed)
Begin CPAP At bedtime  , goal is to wear each night for at least 4-6hr .  Work on healthy weight .  Do not drive if sleepy  Order for dream wear nasal and full face  Follow up with Dr. Halford Chessman  Or Lydell Moga NP in 2 months and As needed

## 2017-05-27 NOTE — Assessment & Plan Note (Signed)
Wt loss  

## 2017-06-10 ENCOUNTER — Telehealth: Payer: Self-pay | Admitting: Adult Health

## 2017-06-10 NOTE — Telephone Encounter (Signed)
cpap ordered on 4/11, referral in chart.  PCCs please advise on status of cpap order.  Thanks!

## 2017-06-10 NOTE — Telephone Encounter (Signed)
Called and left a message for [facility]

## 2017-06-11 NOTE — Telephone Encounter (Signed)
Heather from [employer] called and left a message on Chantel's phone at 12:15pm. Almost 24 hours from when [place] left a message for [employer] yesterday. Nira Conn stated that the patient was ready to schedule but they were having some phone number issues. I called and spoke with Primitivo Gauze and made sure they had the correct numbers that we had. Primitivo Gauze was going to call the patient right now to see if she could get her scheduled

## 2017-06-14 NOTE — Telephone Encounter (Signed)
Attempted to call the pt to see if she has been contacted. I did not receive an answer. I have left a message for pt to return our call.

## 2017-06-17 NOTE — Telephone Encounter (Signed)
Attempted to contact. Call went straight to voicemail. I have left a message for the pt to return our call.

## 2017-06-18 NOTE — Telephone Encounter (Signed)
We have attempted to contact the pt several times with no success or call back from the pt. Per triage protocol, message will be closed.  

## 2017-07-21 ENCOUNTER — Telehealth: Payer: Self-pay | Admitting: Adult Health

## 2017-07-21 NOTE — Telephone Encounter (Signed)
Patient last seen 4.11.19 by TP and new CPAP start order start  Received fax from [facility] stating that: Equipment Setup: Patient has advised she can't move forward with the order at this time.  Patient refused equipment.   TP aware Fax sent to scan

## 2017-07-23 ENCOUNTER — Encounter: Payer: Self-pay | Admitting: Family Medicine

## 2017-07-23 ENCOUNTER — Ambulatory Visit (INDEPENDENT_AMBULATORY_CARE_PROVIDER_SITE_OTHER): Payer: 59 | Admitting: Family Medicine

## 2017-07-23 VITALS — BP 152/88 | HR 89 | Temp 98.2°F | Resp 16 | Ht 63.0 in | Wt 185.0 lb

## 2017-07-23 DIAGNOSIS — M159 Polyosteoarthritis, unspecified: Secondary | ICD-10-CM

## 2017-07-23 DIAGNOSIS — E11618 Type 2 diabetes mellitus with other diabetic arthropathy: Secondary | ICD-10-CM

## 2017-07-23 DIAGNOSIS — I10 Essential (primary) hypertension: Secondary | ICD-10-CM

## 2017-07-23 DIAGNOSIS — N6092 Unspecified benign mammary dysplasia of left breast: Secondary | ICD-10-CM

## 2017-07-23 DIAGNOSIS — J309 Allergic rhinitis, unspecified: Secondary | ICD-10-CM

## 2017-07-23 DIAGNOSIS — G4733 Obstructive sleep apnea (adult) (pediatric): Secondary | ICD-10-CM | POA: Diagnosis not present

## 2017-07-23 DIAGNOSIS — M8949 Other hypertrophic osteoarthropathy, multiple sites: Secondary | ICD-10-CM

## 2017-07-23 DIAGNOSIS — M150 Primary generalized (osteo)arthritis: Secondary | ICD-10-CM

## 2017-07-23 DIAGNOSIS — E119 Type 2 diabetes mellitus without complications: Secondary | ICD-10-CM

## 2017-07-23 DIAGNOSIS — E669 Obesity, unspecified: Secondary | ICD-10-CM | POA: Diagnosis not present

## 2017-07-23 MED ORDER — ALBUTEROL SULFATE HFA 108 (90 BASE) MCG/ACT IN AERS: 1.0000 | INHALATION_SPRAY | RESPIRATORY_TRACT | 2 refills | Status: DC | PRN

## 2017-07-23 MED ORDER — MONTELUKAST SODIUM 10 MG PO TABS: 10.0000 mg | ORAL_TABLET | Freq: Every day | ORAL | 3 refills | Status: DC

## 2017-07-23 MED FILL — MONTELUKAST SOD 10 MG TAB: 10 | 30 days supply | Qty: 30 | Fill #0

## 2017-07-23 MED FILL — VENTOLIN HFA 90 MCG INHALER: 108 (90 BAS | 17 days supply | Qty: 18 | Fill #0

## 2017-07-23 NOTE — Patient Instructions (Addendum)
Tylenol/Acetaminophen ES 500 mg 1-2 three x a day. Max in 24  Hours is 3000 mg. Try taking 1 tab twice daily every day for 2 weeks if helpful can continue  Rub hands down with Lidocaine gel twice daily  Zyrtec/Cetirizine 10 mg tab daily Flonase/Fluticasone nasal spray daily  CoverMyMeds  GoodRx  Encouraged increased hydration and fiber in diet. Daily probiotics. If bowels not moving can use MOM 2 tbls po in 4 oz of warm prune juice by mouth every 2-3 days. If no results then repeat in 4 hours with  Dulcolax suppository pr, may repeat again in 4 more hours as needed. Seek care if symptoms worsen. Consider daily Miralax and/or Dulcolax if symptoms persist.  Try Miralax and Benefiber together once to twice daily   Carbohydrate Counting for Diabetes Mellitus, Adult Carbohydrate counting is a method for keeping track of how many carbohydrates you eat. Eating carbohydrates naturally increases the amount of sugar (glucose) in the blood. Counting how many carbohydrates you eat helps keep your blood glucose within normal limits, which helps you manage your diabetes (diabetes mellitus). It is important to know how many carbohydrates you can safely have in each meal. This is different for every person. A diet and nutrition specialist (registered dietitian) can help you make a meal plan and calculate how many carbohydrates you should have at each meal and snack. Carbohydrates are found in the following foods:  Grains, such as breads and cereals.  Dried beans and soy products.  Starchy vegetables, such as potatoes, peas, and corn.  Fruit and fruit juices.  Milk and yogurt.  Sweets and snack foods, such as cake, cookies, candy, chips, and soft drinks.  How do I count carbohydrates? There are two ways to count carbohydrates in food. You can use either of the methods or a combination of both. Reading "Nutrition Facts" on packaged food The "Nutrition Facts" list is included on the labels of almost  all packaged foods and beverages in the [place] It includes:  The serving size.  Information about nutrients in each serving, including the grams (g) of carbohydrate per serving.  To use the "Nutrition Facts":  Decide how many servings you will have.  Multiply the number of servings by the number of carbohydrates per serving.  The resulting number is the total amount of carbohydrates that you will be having.  Learning standard serving sizes of other foods When you eat foods containing carbohydrates that are not packaged or do not include "Nutrition Facts" on the label, you need to measure the servings in order to count the amount of carbohydrates:  Measure the foods that you will eat with a food scale or measuring cup, if needed.  Decide how many standard-size servings you will eat.  Multiply the number of servings by 15. Most carbohydrate-rich foods have about 15 g of carbohydrates per serving. ? For example, if you eat 8 oz (170 g) of strawberries, you will have eaten 2 servings and 30 g of carbohydrates (2 servings x 15 g = 30 g).  For foods that have more than one food mixed, such as soups and casseroles, you must count the carbohydrates in each food that is included.  The following list contains standard serving sizes of common carbohydrate-rich foods. Each of these servings has about 15 g of carbohydrates:   hamburger bun or  English muffin.   oz (15 mL) syrup.   oz (14 g) jelly.  1 slice of bread.  1 six-inch tortilla.  3  oz (85 g) cooked rice or pasta.  4 oz (113 g) cooked dried beans.  4 oz (113 g) starchy vegetable, such as peas, corn, or potatoes.  4 oz (113 g) hot cereal.  4 oz (113 g) mashed potatoes or  of a large baked potato.  4 oz (113 g) canned or frozen fruit.  4 oz (120 mL) fruit juice.  4-6 crackers.  6 chicken nuggets.  6 oz (170 g) unsweetened dry cereal.  6 oz (170 g) plain fat-free yogurt or yogurt sweetened with artificial  sweeteners.  8 oz (240 mL) milk.  8 oz (170 g) fresh fruit or one small piece of fruit.  24 oz (680 g) popped popcorn.  Example of carbohydrate counting Sample meal  3 oz (85 g) chicken breast.  6 oz (170 g) brown rice.  4 oz (113 g) corn.  8 oz (240 mL) milk.  8 oz (170 g) strawberries with sugar-free whipped topping. Carbohydrate calculation 1. Identify the foods that contain carbohydrates: ? Rice. ? Corn. ? Milk. ? Strawberries. 2. Calculate how many servings you have of each food: ? 2 servings rice. ? 1 serving corn. ? 1 serving milk. ? 1 serving strawberries. 3. Multiply each number of servings by 15 g: ? 2 servings rice x 15 g = 30 g. ? 1 serving corn x 15 g = 15 g. ? 1 serving milk x 15 g = 15 g. ? 1 serving strawberries x 15 g = 15 g. 4. Add together all of the amounts to find the total grams of carbohydrates eaten: ? 30 g + 15 g + 15 g + 15 g = 75 g of carbohydrates total. This information is not intended to replace advice given to you by your health care provider. Make sure you discuss any questions you have with your health care provider. Document Released: 02/02/2005 Document Revised: 08/23/2015 Document Reviewed: 07/17/2015 Elsevier Interactive Patient Education  [employer].

## 2017-07-24 LAB — RHEUMATOID FACTOR: Rhuematoid fact SerPl-aCnc: <14 IU/mL (ref <14)

## 2017-07-24 NOTE — Assessment & Plan Note (Signed)
Worst stiffness in hands with nodules noted as well. RF negative. Encouraged to use stress balls to keep hands moving and try to use Lidocaine gel to rub hands down with.

## 2017-07-24 NOTE — Assessment & Plan Note (Signed)
Is following with pulmonology

## 2017-07-24 NOTE — Assessment & Plan Note (Signed)
Encouraged DASH diet, decrease po intake and increase exercise as tolerated. Needs 7-8 hours of sleep nightly. Avoid trans fats, eat small, frequent meals every 4-5 hours with lean proteins, complex carbs and healthy fats. Minimize simple carbs 

## 2017-07-24 NOTE — Assessment & Plan Note (Signed)
Well controlled, no changes to meds. Encouraged heart healthy diet such as the DASH diet and exercise as tolerated.  °

## 2017-07-24 NOTE — Progress Notes (Signed)
-   Subjective:    Patient ID: Yolanda [name], female    DOB: December 12, 1960, 57 y.o.   MRN: [id number]  Chief Complaint  Patient presents with  . Diabetes    6 month follow up  . Hypertension  . Hyperlipidemia  . Arthritis    bilateral finger pain, worse on right hand,pain started few weeks ago    HPI Patient is in today for follow up. She feels well today but endorses trouble with allergies this year. Has been struggling with nasal congestion, PND and some chest congestion. No fevers or chills. Is also noting some worsening pain and stiffness in her hands with increased nodules on her fingers as well. No recent febrile illness or hospitalizations. Denies CP/palp/SOB/HA/congestion/fevers/GI or GU c/o. Taking meds as prescribed. Is working with pulmonology on her sleep apnea and tolerated her bresat surgery in the fall. Has recovered well.   Past Medical History:  Diagnosis Date  . Asthma    with respiratory infection  . Diabetes mellitus without complication ([place])    type II  . Fibroid uterus   . Hypertension   . Osteoarthritis 03/10/2015  . PONV (postoperative nausea and vomiting)    pt stopped breathing during colonoscopy  . Refusal of blood transfusions as patient is Jehovah's Witness    NO BLOOD PRODUCTS  . Sleep apnea     Past Surgical History:  Procedure Laterality Date  . BREAST CYST ASPIRATION    . CHOLECYSTECTOMY  1/11  . COLONOSCOPY    . DILATATION & CURRETTAGE/HYSTEROSCOPY WITH RESECTOCOPE N/A 03/13/2014   Procedure: DILATATION & CURETTAGE/HYSTEROSCOPY ;  Surgeon: Lyman Speller, MD;  Location: [facility];  Service: Gynecology;  Laterality: N/A;  . RADIOACTIVE SEED GUIDED EXCISIONAL BREAST BIOPSY Left 12/30/2016   Procedure: RADIOACTIVE SEED GUIDED EXCISIONAL BREAST BIOPSY;  Surgeon: Stark Klein, MD;  Location: [facility];  Service: General;  Laterality: Left;    Family History  Problem Relation Age of Onset  . Asthma Mother   . Diabetes  Mother   . Diabetes Father   . Heart attack Father   . Diabetes Brother   . Diabetes Brother   . Asthma Brother   . Heart Problems Sister   . Asthma Sister   . Hypertension Sister   . Heart disease Maternal Grandfather   . Osteopenia Sister     Social History   Socioeconomic History  . Marital status: Married    Spouse name: Not on file  . Number of children: Not on file  . Years of education: Not on file  . Highest education level: Not on file  Occupational History  . Not on file  Social Needs  . Financial resource strain: Not on file  . Food insecurity:    Worry: Not on file    Inability: Not on file  . Transportation needs:    Medical: Not on file    Non-medical: Not on file  Tobacco Use  . Smoking status: Never Smoker  . Smokeless tobacco: Never Used  Substance and Sexual Activity  . Alcohol use: No    Alcohol/week: 0.0 oz    Frequency: Never  . Drug use: No  . 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 Physical activity:    Days per week: Not on file  Minutes per session: Not on file  . Stress: Not on file  Relationships  . Social connections:    Talks on phone: Not on file    Gets together: Not on file    Attends religious service: Not on file    Active member of club or organization: Not on file    Attends meetings of clubs or organizations: Not on file    Relationship status: Not on file  . Intimate partner violence:    Fear of current or ex partner: Not on file    Emotionally abused: Not on file    Physically abused: Not on file    Forced sexual activity: Not on file  Other Topics Concern  . Not on file  Social History Narrative  . Not on file    Outpatient Medications Prior to Visit  Medication Sig Dispense Refill  . B-D ULTRA-FINE 33 LANCETS MISC Check BG 2  times/day    . fluticasone (FLONASE) 50 MCG/ACT nasal spray Place 1 spray into both nostrils daily. 16 g 2  . glucose blood (ONE TOUCH ULTRA TEST) test strip Check BG 2 times/day.  Dx E11.65    . hydrochlorothiazide (HYDRODIURIL) 25 MG tablet Take 1 tablet (25 mg total) by mouth daily. 30 tablet 5  . metFORMIN (GLUCOPHAGE-XR) 500 MG 24 hr tablet Take 1,000 mg by mouth 2 (two) times daily.    . Multiple Vitamin (MULTIVITAMIN) LIQD Take 5 mLs by mouth daily.    Marland Kitchen albuterol (PROVENTIL HFA;VENTOLIN HFA) 108 (90 Base) MCG/ACT inhaler Inhale into the lungs as needed.    Jolyne Loa Grape-Goldenseal (BERBERINE COMPLEX PO) Take by mouth.     No facility-administered medications prior to visit.     Allergies  Allergen Reactions  . Lisinopril Cough    With SOB and wheezing    Review of Systems  Constitutional: Positive for malaise/fatigue. Negative for fever.  HENT: Negative for congestion.   Eyes: Negative for blurred vision.  Respiratory: Negative for shortness of breath.   Cardiovascular: Negative for chest pain and palpitations.  Gastrointestinal: Negative for abdominal pain, blood in stool and nausea.  Genitourinary: Negative for dysuria and frequency.  Musculoskeletal: Negative for falls.  Skin: Negative for rash.  Neurological: Negative for dizziness, loss of consciousness and headaches.  Endo/Heme/Allergies: Negative for environmental allergies.  Psychiatric/Behavioral: Negative for depression. The patient is not nervous/anxious.        Objective:    Physical Exam  Constitutional: She is oriented to person, place, and time. No distress.  HENT:  Head: Normocephalic and atraumatic.  Eyes: Conjunctivae are normal.  Neck: Neck supple. No thyromegaly present.  Cardiovascular: Normal rate, regular rhythm and normal heart sounds.  Pulmonary/Chest: Effort normal and breath sounds normal. She has no wheezes.  Abdominal: She exhibits no distension and no mass.  Musculoskeletal:  She exhibits no edema.  Lymphadenopathy:    She has no cervical adenopathy.  Neurological: She is alert and oriented to person, place, and time.  Skin: Skin is warm and dry. No rash noted. She is not diaphoretic.  Psychiatric: Judgment normal.    BP (!) 152/88 (BP Location: Right Arm, Patient Position: Sitting, Cuff Size: Normal)   Pulse 89   Temp 98.2 F (36.8 C) (Oral)   Resp 16   Ht 5\' 3"  (1.6 m)   Wt 185 lb (83.9 kg)   LMP 02/21/2014   SpO2 99%   BMI 32.77 kg/m  Wt Readings from Last 3 Encounters:  07/23/17 185 lb (83.9  kg)  05/27/17 183 lb 3.2 oz (83.1 kg)  04/15/17 185 lb 6.4 oz (84.1 kg)     Lab Results  Component Value Date   WBC 6.6 01/22/2017   HGB 12.9 01/22/2017   HCT 40.7 01/22/2017   PLT 277.0 01/22/2017   GLUCOSE 104 (H) 01/22/2017   CHOL 175 01/22/2017   TRIG 144.0 01/22/2017   HDL 61.40 01/22/2017   LDLDIRECT 128.9 07/14/2006   LDLCALC 84 01/22/2017   ALT 58 (H) 01/22/2017   AST 36 01/22/2017   NA 139 01/22/2017   K 4.0 01/22/2017   CL 100 01/22/2017   CREATININE 0.59 01/22/2017   BUN 15 01/22/2017   CO2 31 01/22/2017   TSH 2.82 01/22/2017   HGBA1C 7.2 (H) 01/22/2017   MICROALBUR 0.7 03/04/2015    Lab Results  Component Value Date   TSH 2.82 01/22/2017   Lab Results  Component Value Date   WBC 6.6 01/22/2017   HGB 12.9 01/22/2017   HCT 40.7 01/22/2017   MCV 93.5 01/22/2017   PLT 277.0 01/22/2017   Lab Results  Component Value Date   NA 139 01/22/2017   K 4.0 01/22/2017   CO2 31 01/22/2017   GLUCOSE 104 (H) 01/22/2017   BUN 15 01/22/2017   CREATININE 0.59 01/22/2017   BILITOT 0.3 01/22/2017   ALKPHOS 127 (H) 01/22/2017   AST 36 01/22/2017   ALT 58 (H) 01/22/2017   PROT 7.1 01/22/2017   ALBUMIN 4.3 01/22/2017   CALCIUM 9.6 01/22/2017   ANIONGAP 7 12/28/2016   GFR 111.38 01/22/2017   Lab Results  Component Value Date   CHOL 175 01/22/2017   Lab Results  Component Value Date   HDL 61.40 01/22/2017   Lab Results    Component Value Date   LDLCALC 84 01/22/2017   Lab Results  Component Value Date   TRIG 144.0 01/22/2017   Lab Results  Component Value Date   CHOLHDL 3 01/22/2017   Lab Results  Component Value Date   HGBA1C 7.2 (H) 01/22/2017       Assessment & Plan:   Problem List Items Addressed This Visit    OBESITY    Encouraged DASH diet, decrease po intake and increase exercise as tolerated. Needs 7-8 hours of sleep nightly. Avoid trans fats, eat small, frequent meals every 4-5 hours with lean proteins, complex carbs and healthy fats. Minimize simple carbs      OBSTRUCTIVE SLEEP APNEA    Is following with pulmonology      Essential hypertension    Well controlled, no changes to meds. Encouraged heart healthy diet such as the DASH diet and exercise as tolerated.       Allergic rhinitis    Encouraged Zyrtec or Allegra bid, flonase and added Singulair daily      Diabetes ([place])    hgba1c acceptable, minimize simple carbs. Increase exercise as tolerated. Continue current meds. Follows with endocrinology      Osteoarthritis    Worst stiffness in hands with nodules noted as well. RF negative. Encouraged to use stress balls to keep hands moving and try to use Lidocaine gel to rub hands down with.       Atypical lobular hyperplasia (ALH) of left breast    Has recovered from her surgery in the fall and is feeling well in this regard       Other Visit Diagnoses    Arthritis associated with diabetes ([place])    -  Primary   Relevant Orders  Rheumatoid Factor (Completed)      I have discontinued Doriana P. Belvedere's Barberry-Oreg Grape-Goldenseal (BERBERINE COMPLEX PO). I have also changed her albuterol. Additionally, I am having her start on montelukast. Lastly, I am having her maintain her hydrochlorothiazide, metFORMIN, glucose blood, B-D ULTRA-FINE 33 LANCETS, multivitamin, and fluticasone.  Meds ordered this encounter  Medications  . albuterol (PROVENTIL HFA;VENTOLIN HFA)  108 (90 Base) MCG/ACT inhaler    Sig: Inhale 1-2 puffs into the lungs every 4 (four) hours as needed for wheezing or shortness of breath.    Dispense:  18 g    Refill:  2  . montelukast (SINGULAIR) 10 MG tablet    Sig: Take 1 tablet (10 mg total) by mouth at bedtime.    Dispense:  30 tablet    Refill:  3     Penni Homans, MD

## 2017-07-24 NOTE — Assessment & Plan Note (Signed)
Has recovered from her surgery in the fall and is feeling well in this regard

## 2017-07-24 NOTE — Assessment & Plan Note (Signed)
Encouraged Zyrtec or Allegra bid, flonase and added Singulair daily

## 2017-07-24 NOTE — Assessment & Plan Note (Signed)
hgba1c acceptable, minimize simple carbs. Increase exercise as tolerated. Continue current meds. Follows with endocrinology 

## 2017-07-29 ENCOUNTER — Ambulatory Visit: Payer: 59 | Admitting: Adult Health

## 2017-10-11 ENCOUNTER — Encounter: Payer: Self-pay | Admitting: Medical

## 2017-10-11 ENCOUNTER — Ambulatory Visit (INDEPENDENT_AMBULATORY_CARE_PROVIDER_SITE_OTHER): Payer: 59 | Admitting: Medical

## 2017-10-11 ENCOUNTER — Ambulatory Visit (HOSPITAL_BASED_OUTPATIENT_CLINIC_OR_DEPARTMENT_OTHER)
Admission: RE | Admit: 2017-10-11 | Discharge: 2017-10-11 | Disposition: A | Discharge status: Home / Self Care | Payer: 59 | Source: Ambulatory Visit | Attending: Medical | Admitting: Medical

## 2017-10-11 VITALS — BP 152/90 | HR 88 | Temp 98.7°F | Resp 16 | Ht 63.0 in | Wt 186.8 lb

## 2017-10-11 DIAGNOSIS — R062 Wheezing: Secondary | ICD-10-CM | POA: Diagnosis not present

## 2017-10-11 DIAGNOSIS — R05 Cough: Secondary | ICD-10-CM

## 2017-10-11 DIAGNOSIS — R059 Cough, unspecified: Secondary | ICD-10-CM

## 2017-10-11 DIAGNOSIS — J40 Bronchitis, not specified as acute or chronic: Secondary | ICD-10-CM

## 2017-10-11 MED ORDER — FLUTICASONE PROPIONATE HFA 110 MCG/ACT IN AERO: 2.0000 | INHALATION_SPRAY | Freq: Two times a day (BID) | RESPIRATORY_TRACT | 0 refills | Status: DC

## 2017-10-11 MED ORDER — AZITHROMYCIN 250 MG PO TABS: ORAL_TABLET | ORAL | 0 refills | Status: DC

## 2017-10-11 MED ORDER — FLUTICASONE PROPIONATE 50 MCG/ACT NA SUSP: 2.0000 | Freq: Every day | NASAL | 1 refills | Status: DC

## 2017-10-11 MED ORDER — BENZONATATE 100 MG PO CAPS: 100.0000 mg | ORAL_CAPSULE | Freq: Three times a day (TID) | ORAL | 0 refills | Status: DC | PRN

## 2017-10-11 MED ORDER — PREDNISONE 10 MG PO TABS: ORAL_TABLET | ORAL | 0 refills | Status: DC

## 2017-10-11 NOTE — Progress Notes (Signed)
Subjective:    Patient ID: Yolanda [name], female    DOB: 1961-01-09, 57 y.o.   MRN: [id number]  HPI  Pt in with cough for about 6 days. She states cough is both day and night. Some worsening cough at night. She states cough is productive. Pt has been wheezing. Recently using ventolin about every 3 hours.She just recently wheezing a lot. Before she got sick was not using albuterol at all. Mostly just uses when she gets sick. No fever, no chills or sweats.  Pt diabetic. Last a1c was 7.2   Her sugars have been 120-140 recently when she checks.  Pt sugar this afternoon was 146.     Review of Systems  Constitutional: Negative for chills, fatigue and fever.  HENT: Positive for congestion. Negative for sinus pressure.   Respiratory: Positive for cough and wheezing. Negative for chest tightness and shortness of breath.   Cardiovascular: Negative for chest pain and palpitations.  Gastrointestinal: Negative for abdominal pain.  Neurological: Negative for dizziness and headaches.  Hematological: Negative for adenopathy.  Psychiatric/Behavioral: Negative for behavioral problems and confusion.    Past Medical History:  Diagnosis Date  . Asthma    with respiratory infection  . Diabetes mellitus without complication ([place])    type II  . Fibroid uterus   . Hypertension   . Osteoarthritis 03/10/2015  . PONV (postoperative nausea and vomiting)    pt stopped breathing during colonoscopy  . Refusal of blood transfusions as patient is Jehovah's Witness    NO BLOOD PRODUCTS  . Sleep apnea      Social History   Socioeconomic History  . Marital status: Married    Spouse name: Not on file  . Number of children: Not on file  . Years of education: Not on file  . Highest education level: Not on file  Occupational History  . Not on file  Social Needs  . Financial resource strain: Not on file  . Food insecurity:    Worry: Not on file    Inability: Not on file  . Transportation needs:   Medical: Not on file    Non-medical: Not on file  Tobacco Use  . Smoking status: Never Smoker  . Smokeless tobacco: Never Used  Substance and Sexual Activity  . Alcohol use: No    Alcohol/week: 0.0 standard drinks    Frequency: Never  . Drug use: No  . 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 Physical activity:    Days per week: Not on file    Minutes per session: Not on file  . Stress: Not on file  Relationships  . Social connections:    Talks on phone: Not on file    Gets together: Not on file    Attends religious service: Not on file    Active member of club or organization: Not on file    Attends meetings of clubs or organizations: Not on file    Relationship status: Not on file  . Intimate partner violence:    Fear of current or ex partner: Not on file    Emotionally abused: Not on file    Physically abused: Not on file    Forced sexual activity: Not on file  Other Topics Concern  . Not on file  Social History Narrative  .  Not on file    Past Surgical History:  Procedure Laterality Date  . BREAST CYST ASPIRATION    . CHOLECYSTECTOMY  1/11  . COLONOSCOPY    . DILATATION & CURRETTAGE/HYSTEROSCOPY WITH RESECTOCOPE N/A 03/13/2014   Procedure: DILATATION & CURETTAGE/HYSTEROSCOPY ;  Surgeon: Lyman Speller, MD;  Location: [facility];  Service: Gynecology;  Laterality: N/A;  . RADIOACTIVE SEED GUIDED EXCISIONAL BREAST BIOPSY Left 12/30/2016   Procedure: RADIOACTIVE SEED GUIDED EXCISIONAL BREAST BIOPSY;  Surgeon: Stark Klein, MD;  Location: [facility];  Service: General;  Laterality: Left;    Family History  Problem Relation Age of Onset  . Asthma Mother   . Diabetes Mother   . Diabetes Father   . Heart attack Father   . Diabetes Brother   . Diabetes Brother   . Asthma  Brother   . Heart Problems Sister   . Asthma Sister   . Hypertension Sister   . Heart disease Maternal Grandfather   . Osteopenia Sister     Allergies  Allergen Reactions  . Lisinopril Cough    With SOB and wheezing    Current Outpatient Medications on File Prior to Visit  Medication Sig Dispense Refill  . albuterol (PROVENTIL HFA;VENTOLIN HFA) 108 (90 Base) MCG/ACT inhaler Inhale 1-2 puffs into the lungs every 4 (four) hours as needed for wheezing or shortness of breath. 18 g 2  . B-D ULTRA-FINE 33 LANCETS MISC Check BG 2 times/day    . fluticasone (FLONASE) 50 MCG/ACT nasal spray Place 1 spray into both nostrils daily. 16 g 2  . glucose blood (ONE TOUCH ULTRA TEST) test strip Check BG 2 times/day.  Dx E11.65    . metFORMIN (GLUCOPHAGE-XR) 500 MG 24 hr tablet Take 1,000 mg by mouth 2 (two) times daily.    . Multiple Vitamin (MULTIVITAMIN) LIQD Take 5 mLs by mouth daily.     No current facility-administered medications on file prior to visit.     BP (!) 152/90   Pulse 88   Temp 98.7 F (37.1 C) (Oral)   Resp 16   Ht 5\' 3"  (1.6 m)   Wt 186 lb 12.8 oz (84.7 kg)   LMP 02/21/2014   SpO2 96%   BMI 33.09 kg/m       Objective:   Physical Exam  General  Mental Status - Alert. General Appearance - Well groomed. Not in acute distress.  Skin Rashes- No Rashes.  HEENT Head- Normal. Ear Auditory Canal - Left- Normal. Right - Normal.Tympanic Membrane- Left- Normal. Right- Normal. Eye Sclera/Conjunctiva- Left- Normal. Right- Normal. Nose & Sinuses Nasal Mucosa- Left-  Not boggy or Congested. Right-  Not  boggy or Congested. Mouth & Throat Lips: Upper Lip- Normal: no dryness, cracking, pallor, cyanosis, or vesicular eruption. Lower Lip-Normal: no dryness, cracking, pallor, cyanosis or vesicular eruption. Buccal Mucosa- Bilateral- No Aphthous ulcers. Oropharynx- No Discharge or Erythema. Tonsils: Characteristics- Bilateral- No Erythema or Congestion. Size/Enlargement-  Bilateral- No enlargement. Discharge- bilateral-None.  Neck Neck- Supple. No Masses.   Chest and Lung Exam Auscultation: Breath Sounds:- even and unlabored, but bilateral upper lobe rhonchi.  Cardiovascular Auscultation:Rythm- Regular, rate and rhythm. Murmurs & Other Heart Sounds:Ausculatation of the heart reveal- No Murmurs.  Lymphatic Head & Neck General Head & Neck Lymphatics: Bilateral: Description- No Localized lymphadenopathy.       Assessment & Plan:  You appear to have bronchitis. Rest hydrate and tylenol for fever. I am prescribing cough medicine benzonatate, and azithromycin antibiotic. For your nasal  congestion rx flonase.  Please get chest xray today.  You have albuterol inhaler to use for wheezing. But do think prednisone taper dose needed in light of using albuterol every 2-3 hours for resent wheezing.  Also rx of flovent sent to pharmacy.  Humalog sliding scale insulin to be used only if premeal blood sugars are above 200. Making available if you need as discussed. Continue metformin. If you watch your diet you may not need to use.(humalog sample pen given).   Follow up in 7-10 days or as needed  [employer]

## 2017-10-11 NOTE — Patient Instructions (Addendum)
You appear to have bronchitis. Rest hydrate and tylenol for fever. I am prescribing cough medicine benzonatate, and azithromycin antibiotic. For your nasal congestion rx flonase.  Please get chest xray today.  You have albuterol inhaler to use for wheezing. But do think prednisone taper dose needed in light of using albuterol every 2-3 hours for resent wheezing. Also rx of flovent sent to pharmacy.  Humalog sliding scale insulin to be used only if premeal blood sugars are above 200. Making available if you need as discussed. Continue metformin. If you watch your diet you may not need to use.(humalog sample pen given).   Follow up in 7-10 days or as needed

## 2017-10-16 ENCOUNTER — Encounter: Payer: Self-pay | Admitting: Medical

## 2017-10-19 ENCOUNTER — Telehealth: Payer: Self-pay | Admitting: Medical

## 2017-10-19 NOTE — Telephone Encounter (Addendum)
I had to give a  diabetic pt sample pen sliding scale insulin  as I treated her with prednisone. He sugars never spiked up and she never had to use. She wants to bring back sample pen. Am I correct that we can't accept pen back? Not sure? Just let me know? Again she states has not used at all. Was only there as back up.

## 2017-10-25 DIAGNOSIS — E119 Type 2 diabetes mellitus without complications: Secondary | ICD-10-CM | POA: Diagnosis not present

## 2017-10-25 DIAGNOSIS — E785 Hyperlipidemia, unspecified: Secondary | ICD-10-CM | POA: Diagnosis not present

## 2017-10-25 DIAGNOSIS — I10 Essential (primary) hypertension: Secondary | ICD-10-CM | POA: Diagnosis not present

## 2017-10-26 NOTE — Telephone Encounter (Signed)
Author phoned pt. to notify her that we cannot accept sample pen back, leaving detailed VM. Author advised her to throw it out, as Chief Strategy Officer unsure how long the insulin would be good for in the event pt. Even needed it down the line.

## 2017-10-27 DIAGNOSIS — E119 Type 2 diabetes mellitus without complications: Secondary | ICD-10-CM | POA: Diagnosis not present

## 2017-11-04 LAB — LIPID PANEL: LDL Cholesterol: 144

## 2017-11-04 LAB — MICROALBUMIN, URINE: Microalb, Ur: 26

## 2017-11-04 LAB — HEPATIC FUNCTION PANEL
ALK PHOS: 131 — AB (ref 25–125)
ALT: 48 — AB (ref 7–35)
AST: 31 (ref 13–35)
Bilirubin, Total: 0.4

## 2017-11-04 LAB — BASIC METABOLIC PANEL
BUN: 14 (ref 4–21)
Creatinine: 0.6 (ref 0.5–1.1)
Glucose: 141
Potassium: 4.2 (ref 3.4–5.3)
Sodium: 138 (ref 137–147)

## 2017-11-04 LAB — TSH: TSH: 2.14 (ref 0.41–5.90)

## 2017-11-04 LAB — HEMOGLOBIN A1C: Hemoglobin A1C: 6.9

## 2017-12-07 DIAGNOSIS — G4733 Obstructive sleep apnea (adult) (pediatric): Secondary | ICD-10-CM | POA: Diagnosis not present

## 2017-12-14 DIAGNOSIS — Z1211 Encounter for screening for malignant neoplasm of colon: Secondary | ICD-10-CM | POA: Diagnosis not present

## 2017-12-14 DIAGNOSIS — K5900 Constipation, unspecified: Secondary | ICD-10-CM | POA: Diagnosis not present

## 2017-12-14 DIAGNOSIS — R748 Abnormal levels of other serum enzymes: Secondary | ICD-10-CM | POA: Diagnosis not present

## 2017-12-15 ENCOUNTER — Other Ambulatory Visit: Payer: Self-pay | Admitting: Gastroenterology

## 2017-12-15 DIAGNOSIS — R7989 Other specified abnormal findings of blood chemistry: Secondary | ICD-10-CM

## 2017-12-15 DIAGNOSIS — R945 Abnormal results of liver function studies: Principal | ICD-10-CM

## 2017-12-21 ENCOUNTER — Ambulatory Visit
Admission: RE | Admit: 2017-12-21 | Discharge: 2017-12-21 | Disposition: A | Discharge status: Home / Self Care | Payer: 59 | Source: Ambulatory Visit | Attending: Gastroenterology | Admitting: Gastroenterology

## 2017-12-21 DIAGNOSIS — R7989 Other specified abnormal findings of blood chemistry: Secondary | ICD-10-CM | POA: Diagnosis not present

## 2017-12-23 ENCOUNTER — Telehealth: Payer: Self-pay | Admitting: *Deleted

## 2017-12-23 NOTE — Telephone Encounter (Signed)
Received US Abdomen Linited RUQ results from [facility]; forwarded to provider/SLS 07/11

## 2018-01-04 ENCOUNTER — Encounter: Payer: Self-pay | Admitting: Family Medicine

## 2018-01-07 DIAGNOSIS — G4733 Obstructive sleep apnea (adult) (pediatric): Secondary | ICD-10-CM | POA: Diagnosis not present

## 2018-01-31 ENCOUNTER — Encounter: Payer: Self-pay | Admitting: Family Medicine

## 2018-01-31 ENCOUNTER — Ambulatory Visit (INDEPENDENT_AMBULATORY_CARE_PROVIDER_SITE_OTHER): Payer: 59 | Admitting: Family Medicine

## 2018-01-31 VITALS — BP 130/80 | HR 77 | Temp 98.3°F | Resp 18 | Ht 63.0 in | Wt 185.8 lb

## 2018-01-31 DIAGNOSIS — R748 Abnormal levels of other serum enzymes: Secondary | ICD-10-CM | POA: Diagnosis not present

## 2018-01-31 DIAGNOSIS — R519 Headache, unspecified: Secondary | ICD-10-CM

## 2018-01-31 DIAGNOSIS — Z0000 Encounter for general adult medical examination without abnormal findings: Secondary | ICD-10-CM | POA: Diagnosis not present

## 2018-01-31 DIAGNOSIS — Z23 Encounter for immunization: Secondary | ICD-10-CM | POA: Diagnosis not present

## 2018-01-31 DIAGNOSIS — I10 Essential (primary) hypertension: Secondary | ICD-10-CM

## 2018-01-31 DIAGNOSIS — E119 Type 2 diabetes mellitus without complications: Secondary | ICD-10-CM | POA: Diagnosis not present

## 2018-01-31 DIAGNOSIS — G4733 Obstructive sleep apnea (adult) (pediatric): Secondary | ICD-10-CM

## 2018-01-31 DIAGNOSIS — R51 Headache: Secondary | ICD-10-CM

## 2018-01-31 DIAGNOSIS — K760 Fatty (change of) liver, not elsewhere classified: Secondary | ICD-10-CM | POA: Insufficient documentation

## 2018-01-31 DIAGNOSIS — E669 Obesity, unspecified: Secondary | ICD-10-CM

## 2018-01-31 DIAGNOSIS — E785 Hyperlipidemia, unspecified: Secondary | ICD-10-CM

## 2018-01-31 DIAGNOSIS — J069 Acute upper respiratory infection, unspecified: Secondary | ICD-10-CM

## 2018-01-31 DIAGNOSIS — N6092 Unspecified benign mammary dysplasia of left breast: Secondary | ICD-10-CM

## 2018-01-31 MED ORDER — CHLORTHALIDONE 25 MG PO TABS: 25.0000 mg | ORAL_TABLET | Freq: Every day | ORAL | 5 refills | Status: DC

## 2018-01-31 MED ORDER — AZITHROMYCIN 250 MG PO TABS: ORAL_TABLET | ORAL | 0 refills | Status: DC

## 2018-01-31 MED ORDER — PREDNISONE 10 MG PO TABS: ORAL_TABLET | ORAL | 0 refills | Status: DC

## 2018-01-31 MED ORDER — METFORMIN HCL ER 500 MG PO TB24: ORAL_TABLET | ORAL | 1 refills | Status: DC

## 2018-01-31 NOTE — Assessment & Plan Note (Signed)
Normalized on recent check at [facility]

## 2018-01-31 NOTE — Patient Instructions (Addendum)
Shingrix is the new shingles shot 2 shots over 2-6 months at pharmacy Flu shot  Encouraged increased rest and hydration, add probiotics, zinc such as Coldeze or Xicam. Treat fevers as needed, Mucinex twice daily, Elderberry and Vitamin C 500 to 1000 mg daily    Preventive Care 40-64 Years, Female Preventive care refers to lifestyle choices and visits with your health care provider that can promote health and wellness. What does preventive care include?  A yearly physical exam. This is also called an annual well check.  Dental exams once or twice a year.  Routine eye exams. Ask your health care provider how often you should have your eyes checked.  Personal lifestyle choices, including: ? Daily care of your teeth and gums. ? Regular physical activity. ? Eating a healthy diet. ? Avoiding tobacco and drug use. ? Limiting alcohol use. ? Practicing safe sex. ? Taking low-dose aspirin daily starting at age 43. ? Taking vitamin and mineral supplements as recommended by your health care provider. What happens during an annual well check? The services and screenings done by your health care provider during your annual well check will depend on your age, overall health, lifestyle risk factors, and family history of disease. Counseling Your health care provider may ask you questions about your:  Alcohol use.  Tobacco use.  Drug use.  Emotional well-being.  Home and relationship well-being.  Sexual activity.  Eating habits.  Work and work Statistician.  Method of birth control.  Menstrual cycle.  Pregnancy history.  Screening You may have the following tests or measurements:  Height, weight, and BMI.  Blood pressure.  Lipid and cholesterol levels. These may be checked every 5 years, or more frequently if you are over 90 years old.  Skin check.  Lung cancer screening. You may have this screening every year starting at age 57 if you have a 30-pack-year history of  smoking and currently smoke or have quit within the past 15 years.  Fecal occult blood test (FOBT) of the stool. You may have this test every year starting at age 91.  Flexible sigmoidoscopy or colonoscopy. You may have a sigmoidoscopy every 5 years or a colonoscopy every 10 years starting at age 37.  Hepatitis C blood test.  Hepatitis B blood test.  Sexually transmitted disease (STD) testing.  Diabetes screening. This is done by checking your blood sugar (glucose) after you have not eaten for a while (fasting). You may have this done every 1-3 years.  Mammogram. This may be done every 1-2 years. Talk to your health care provider about when you should start having regular mammograms. This may depend on whether you have a family history of breast cancer.  BRCA-related cancer screening. This may be done if you have a family history of breast, ovarian, tubal, or peritoneal cancers.  Pelvic exam and Pap test. This may be done every 3 years starting at age 48. Starting at age 7, this may be done every 5 years if you have a Pap test in combination with an HPV test.  Bone density scan. This is done to screen for osteoporosis. You may have this scan if you are at high risk for osteoporosis.  Discuss your test results, treatment options, and if necessary, the need for more tests with your health care provider. Vaccines Your health care provider may recommend certain vaccines, such as:  Influenza vaccine. This is recommended every year.  Tetanus, diphtheria, and acellular pertussis (Tdap, Td) vaccine. You may need a Td  booster every 10 years.  Varicella vaccine. You may need this if you have not been vaccinated.  Zoster vaccine. You may need this after age 60.  Measles, mumps, and rubella (MMR) vaccine. You may need at least one dose of MMR if you were born in 1957 or later. You may also need a second dose.  Pneumococcal 13-valent conjugate (PCV13) vaccine. You may need this if you have  certain conditions and were not previously vaccinated.  Pneumococcal polysaccharide (PPSV23) vaccine. You may need one or two doses if you smoke cigarettes or if you have certain conditions.  Meningococcal vaccine. You may need this if you have certain conditions.  Hepatitis A vaccine. You may need this if you have certain conditions or if you travel or work in places where you may be exposed to hepatitis A.  Hepatitis B vaccine. You may need this if you have certain conditions or if you travel or work in places where you may be exposed to hepatitis B.  Haemophilus influenzae type b (Hib) vaccine. You may need this if you have certain conditions.  Talk to your health care provider about which screenings and vaccines you need and how often you need them. This information is not intended to replace advice given to you by your health care provider. Make sure you discuss any questions you have with your health care provider. Document Released: 03/01/2015 Document Revised: 10/23/2015 Document Reviewed: 12/04/2014 Elsevier Interactive Patient Education  [employer].

## 2018-01-31 NOTE — Assessment & Plan Note (Signed)
Patient encouraged to maintain heart healthy diet, regular exercise, adequate sleep. Consider daily probiotics. Take medications as prescribed 

## 2018-01-31 NOTE — Progress Notes (Signed)
Subjective:    Patient ID: Yolanda [name], female    DOB: 08/27/1960, 57 y.o.   MRN: [id number]  No chief complaint on file.   HPI Patient is in today for preventative exam and follow up on chronic medical concerns including, fatty liver disease, hypertension, diabetes and more. She is having with insurance and they will no longer let her have her endocrinologist at [facility]. She is asking if we can take over her management of diabetes and fatty liver. She is eating and minimizing carbs.  She is trying to stay active.  She brings in paperwork shows her last hemoglobin A1c was 6.8.  No polyuria or polydipsia.  She is managing her activities of daily living well.  She is noting some occasional drainage from her ears but no pain itching fevers or changes in hearing.  Has seen Dr. Claria Dice of ENT in the past and will notify his office if symptoms return. Denies CP/palp/SOB/HA/congestion/fevers/GI or GU c/o. Taking meds as prescribed  Past Medical History:  Diagnosis Date  . Asthma    with respiratory infection  . Diabetes mellitus without complication ([place])    type II  . Fibroid uterus   . Hypertension   . Osteoarthritis 03/10/2015  . PONV (postoperative nausea and vomiting)    pt stopped breathing during colonoscopy  . Refusal of blood transfusions as patient is Jehovah's Witness    NO BLOOD PRODUCTS  . Sleep apnea     Past Surgical History:  Procedure Laterality Date  . BREAST CYST ASPIRATION    . CHOLECYSTECTOMY  1/11  . COLONOSCOPY    . DILATATION & CURRETTAGE/HYSTEROSCOPY WITH RESECTOCOPE N/A 03/13/2014   Procedure: DILATATION & CURETTAGE/HYSTEROSCOPY ;  Surgeon: Lyman Speller, MD;  Location: [facility];  Service: Gynecology;  Laterality: N/A;  . RADIOACTIVE SEED GUIDED EXCISIONAL BREAST BIOPSY Left 12/30/2016   Procedure: RADIOACTIVE SEED GUIDED EXCISIONAL BREAST BIOPSY;  Surgeon: Stark Klein, MD;  Location: [place];  Service: General;  Laterality: Left;      Family History  Problem Relation Age of Onset  . Asthma Mother   . Diabetes Mother   . Diabetes Father   . Heart attack Father   . Diabetes Brother   . Diabetes Brother   . Asthma Brother   . Heart Problems Sister   . Asthma Sister   . Hypertension Sister   . Heart disease Maternal Grandfather   . Osteopenia Sister     Social History   Socioeconomic History  . Marital status: Married    Spouse name: Not on file  . Number of children: Not on file  . Years of education: Not on file  . Highest education level: Not on file  Occupational History  . Not on file  Social Needs  . Financial resource strain: Not on file  . Food insecurity:    Worry: Not on file    Inability: Not on file  . Transportation needs:    Medical: Not on file    Non-medical: Not on file  Tobacco Use  . Smoking status: Never Smoker  . Smokeless tobacco: Never Used  Substance and Sexual Activity  . Alcohol use: No    Alcohol/week: 0.0 standard drinks    Frequency: Never  . Drug use: No  . 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 Physical activity:    Days per week: Not on file    Minutes per session: Not on file  . Stress: Not on file  Relationships  . Social connections:    Talks on phone: Not on file    Gets together: Not on file    Attends religious service: Not on file    Active member of club or organization: Not on file    Attends meetings of clubs or organizations: Not on file    Relationship status: Not on file  . Intimate partner violence:    Fear of current or ex partner: Not on file    Emotionally abused: Not on file    Physically abused: Not on file    Forced sexual activity: Not on file  Other Topics Concern  . Not on file  Social History Narrative  . Not on file    Outpatient Medications  Prior to Visit  Medication Sig Dispense Refill  . albuterol (PROVENTIL HFA;VENTOLIN HFA) 108 (90 Base) MCG/ACT inhaler Inhale 1-2 puffs into the lungs every 4 (four) hours as needed for wheezing or shortness of breath. 18 g 2  . B-D ULTRA-FINE 33 LANCETS MISC Check BG 2 times/day    . fluticasone (FLONASE) 50 MCG/ACT nasal spray Place 2 sprays into both nostrils daily. 16 g 1  . fluticasone (FLOVENT HFA) 110 MCG/ACT inhaler Inhale 2 puffs into the lungs 2 (two) times daily. 1 Inhaler 0  . glucose blood (ONE TOUCH ULTRA TEST) test strip Check BG 2 times/day.  Dx E11.65    . Multiple Vitamin (MULTIVITAMIN) LIQD Take 5 mLs by mouth daily.    Marland Kitchen azithromycin (ZITHROMAX) 250 MG tablet Take 2 tablets by mouth on day 1, followed by 1 tablet by mouth daily for 4 days. 6 tablet 0  . benzonatate (TESSALON) 100 MG capsule Take 1 capsule (100 mg total) by mouth 3 (three) times daily as needed for cough. 30 capsule 0  . fluticasone (FLONASE) 50 MCG/ACT nasal spray Place 1 spray into both nostrils daily. 16 g 2  . metFORMIN (GLUCOPHAGE-XR) 500 MG 24 hr tablet Take 1,000 mg by mouth 2 (two) times daily.    . predniSONE (DELTASONE) 10 MG tablet 6 TAB PO DAY 1 5 TAB PO DAY 2 4 TAB PO DAY 3 3 TAB PO DAY 4 2 TAB PO DAY 5 1 TAB PO DAY 6 21 tablet 0   No facility-administered medications prior to visit.     Allergies  Allergen Reactions  . Lisinopril Cough    With SOB and wheezing    Review of Systems  Constitutional: Positive for malaise/fatigue. Negative for chills and fever.  HENT: Negative for congestion and hearing loss.   Eyes: Negative for discharge.  Respiratory: Negative for cough, sputum production and shortness of breath.   Cardiovascular: Negative for chest pain, palpitations and leg swelling.  Gastrointestinal: Negative for abdominal pain, blood in stool, constipation, diarrhea, heartburn, nausea and vomiting.  Genitourinary: Negative for dysuria, frequency, hematuria and urgency.    Musculoskeletal: Negative for back pain, falls and myalgias.  Skin: Negative for rash.  Neurological: Negative for dizziness, sensory change, loss of consciousness, weakness and headaches.  Endo/Heme/Allergies: Negative for environmental allergies. Does not bruise/bleed easily.  Psychiatric/Behavioral: Negative for depression and suicidal ideas. The patient is not nervous/anxious and does not have insomnia.        Objective:    Physical Exam Constitutional:      General: She  is not in acute distress.    Appearance: She is well-developed.  HENT:     Head: Normocephalic and atraumatic.  Eyes:     Conjunctiva/sclera: Conjunctivae normal.  Neck:     Musculoskeletal: Neck supple.     Thyroid: No thyromegaly.  Cardiovascular:     Rate and Rhythm: Normal rate and regular rhythm.     Heart sounds: Normal heart sounds. No murmur.  Pulmonary:     Effort: Pulmonary effort is normal. No respiratory distress.     Breath sounds: Normal breath sounds.  Abdominal:     General: Bowel sounds are normal. There is no distension.     Palpations: Abdomen is soft. There is no mass.     Tenderness: There is no abdominal tenderness.  Lymphadenopathy:     Cervical: No cervical adenopathy.  Skin:    General: Skin is warm and dry.  Neurological:     Mental Status: She is alert and oriented to person, place, and time.  Psychiatric:        Behavior: Behavior normal.     BP 130/80   Pulse 77   Temp 98.3 F (36.8 C) (Oral)   Resp 18   Ht 5\' 3"  (1.6 m)   Wt 185 lb 12.8 oz (84.3 kg)   LMP 02/21/2014   SpO2 96%   BMI 32.91 kg/m  Wt Readings from Last 3 Encounters:  01/31/18 185 lb 12.8 oz (84.3 kg)  10/11/17 186 lb 12.8 oz (84.7 kg)  07/23/17 185 lb (83.9 kg)     Lab Results  Component Value Date   WBC 6.6 01/22/2017   HGB 12.9 01/22/2017   HCT 40.7 01/22/2017   PLT 277.0 01/22/2017   GLUCOSE 104 (H) 01/22/2017   CHOL 175 01/22/2017   TRIG 144.0 01/22/2017   HDL 61.40 01/22/2017    LDLDIRECT 128.9 07/14/2006   LDLCALC 84 01/22/2017   ALT 58 (H) 01/22/2017   AST 36 01/22/2017   NA 139 01/22/2017   K 4.0 01/22/2017   CL 100 01/22/2017   CREATININE 0.59 01/22/2017   BUN 15 01/22/2017   CO2 31 01/22/2017   TSH 2.82 01/22/2017   HGBA1C 7.2 (H) 01/22/2017   MICROALBUR 0.7 03/04/2015    Lab Results  Component Value Date   TSH 2.82 01/22/2017   Lab Results  Component Value Date   WBC 6.6 01/22/2017   HGB 12.9 01/22/2017   HCT 40.7 01/22/2017   MCV 93.5 01/22/2017   PLT 277.0 01/22/2017   Lab Results  Component Value Date   NA 139 01/22/2017   K 4.0 01/22/2017   CO2 31 01/22/2017   GLUCOSE 104 (H) 01/22/2017   BUN 15 01/22/2017   CREATININE 0.59 01/22/2017   BILITOT 0.3 01/22/2017   ALKPHOS 127 (H) 01/22/2017   AST 36 01/22/2017   ALT 58 (H) 01/22/2017   PROT 7.1 01/22/2017   ALBUMIN 4.3 01/22/2017   CALCIUM 9.6 01/22/2017   ANIONGAP 7 12/28/2016   GFR 111.38 01/22/2017   Lab Results  Component Value Date   CHOL 175 01/22/2017   Lab Results  Component Value Date   HDL 61.40 01/22/2017   Lab Results  Component Value Date   LDLCALC 84 01/22/2017   Lab Results  Component Value Date   TRIG 144.0 01/22/2017   Lab Results  Component Value Date   CHOLHDL 3 01/22/2017   Lab Results  Component Value Date   HGBA1C 7.2 (H) 01/22/2017  Assessment & Plan:   Problem List Items Addressed This Visit    OBESITY    Encouraged DASH diet, decrease po intake and increase exercise as tolerated. Needs 7-8 hours of sleep nightly. Avoid trans fats, eat small, frequent meals every 4-5 hours with lean proteins, complex carbs and healthy fats. Minimize simple carbs      Relevant Medications   metFORMIN (GLUCOPHAGE-XR) 500 MG 24 hr tablet   OBSTRUCTIVE SLEEP APNEA    Using CPAP nightly, headaches have resolved      Essential hypertension    Well controlled, no changes to meds. Encouraged heart healthy diet such as the DASH diet and  exercise as tolerated. Change from HCTZ to Chlorthalidone, 1 mn bp and cmp with RN      Relevant Medications   chlorthalidone (HYGROTON) 25 MG tablet   Other Relevant Orders   CBC   Comprehensive metabolic panel   TSH   Comprehensive metabolic panel   Headache    Better with CPAP use      Diabetes (HCC)    hgba1c acceptable, minimize simple carbs. Increase exercise as tolerated. Continue current meds.       Relevant Medications   metFORMIN (GLUCOPHAGE-XR) 500 MG 24 hr tablet   Other Relevant Orders   Hemoglobin A1c   Abnormal liver enzymes    Normalized on [facility] check [facility]      Atypical lobular hyperplasia [facility]) of left breast    Has 1 year follow pu today doing well      Hyperlipidemia    Tolerating statin, encouraged heart healthy diet, avoid trans fats, minimize simple carbs and saturated fats. Increase exercise as tolerated      Relevant Medications   chlorthalidone (HYGROTON) 25 MG tablet   Other Relevant Orders   Lipid panel   Preventative health care    Patient encouraged to maintain heart healthy diet, regular exercise, adequate sleep. Consider daily probiotics. Take medications as prescribed      Fatty liver    Will monitor and  Minimize carbs and stay active      URI (upper respiratory infection)    Mild but gets bad fast. Will give refill on Zpak and steroid if worsens for taking. Encouraged increased rest and hydration, add probiotics, zinc such as Coldeze or Xicam. Treat fevers as needed Mucinex twice daily, vitamin c and elderberry      Relevant Medications   azithromycin (ZITHROMAX) 250 MG tablet      I have discontinued [facility] Mc[facility]'s benzonatate. I have also changed her metFORMIN. Additionally, I am having her start on chlorthalidone. Lastly, I am having her maintain her glucose blood, B-D ULTRA-FINE 33 LANCETS, multivitamin, albuterol, fluticasone, fluticasone, predniSONE, and azithromycin.  Meds ordered this encounter  Medications   . chlorthalidone (HYGROTON) 25 MG tablet    Sig: Take 1 tablet (25 mg total) by mouth daily.    Dispense:  30 tablet    Refill:  5  . metFORMIN (GLUCOPHAGE-XR) 500 MG 24 hr tablet    Sig: 500 mg in am and 1000 mg in pm    Dispense:  270 tablet    Refill:  1  . predniSONE (DELTASONE) 10 MG tablet    Sig: 6 TAB PO DAY 1 5 TAB PO DAY 2 4 TAB PO DAY 3 3 TAB PO DAY 4 2 TAB PO DAY 5 1 TAB PO DAY 6    Dispense:  21 tablet    Refill:  0  . azithromycin (ZITHROMAX)  250 MG tablet    Sig: Take 2 tablets by mouth on day 1, followed by 1 tablet by mouth daily for 4 days.    Dispense:  6 tablet    Refill:  0     Penni Homans, MD

## 2018-01-31 NOTE — Assessment & Plan Note (Signed)
Better with CPAP use

## 2018-01-31 NOTE — Assessment & Plan Note (Signed)
hgba1c acceptable, minimize simple carbs. Increase exercise as tolerated. Continue current meds 

## 2018-01-31 NOTE — Assessment & Plan Note (Signed)
Has 1 year follow pu today doing well

## 2018-01-31 NOTE — Assessment & Plan Note (Signed)
Mild but gets bad fast. Will give refill on Zpak and steroid if worsens for taking. Encouraged increased rest and hydration, add probiotics, zinc such as Coldeze or Xicam. Treat fevers as needed Mucinex twice daily, vitamin c and elderberry

## 2018-01-31 NOTE — Assessment & Plan Note (Signed)
Will monitor and  Minimize carbs and stay active

## 2018-01-31 NOTE — Assessment & Plan Note (Signed)
Tolerating statin, encouraged heart healthy diet, avoid trans fats, minimize simple carbs and saturated fats. Increase exercise as tolerated 

## 2018-01-31 NOTE — Assessment & Plan Note (Addendum)
Well controlled, no changes to meds. Encouraged heart healthy diet such as the DASH diet and exercise as tolerated. Change from HCTZ to Chlorthalidone, 1 mn bp and cmp with RN

## 2018-01-31 NOTE — Assessment & Plan Note (Signed)
Using CPAP nightly, headaches have resolved

## 2018-01-31 NOTE — Assessment & Plan Note (Signed)
Encouraged DASH diet, decrease po intake and increase exercise as tolerated. Needs 7-8 hours of sleep nightly. Avoid trans fats, eat small, frequent meals every 4-5 hours with lean proteins, complex carbs and healthy fats. Minimize simple carbs 

## 2018-02-03 ENCOUNTER — Telehealth: Payer: Self-pay | Admitting: *Deleted

## 2018-02-03 NOTE — Telephone Encounter (Signed)
Received Colonoscopy Results from [facility]; forwarded to provider/SLS 12/19

## 2018-02-06 DIAGNOSIS — G4733 Obstructive sleep apnea (adult) (pediatric): Secondary | ICD-10-CM | POA: Diagnosis not present

## 2018-02-15 ENCOUNTER — Encounter: Payer: Self-pay | Admitting: Family Medicine

## 2018-02-21 DIAGNOSIS — N6092 Unspecified benign mammary dysplasia of left breast: Secondary | ICD-10-CM | POA: Diagnosis not present

## 2018-02-23 ENCOUNTER — Other Ambulatory Visit: Payer: Self-pay | Admitting: General Surgery

## 2018-02-23 DIAGNOSIS — N6092 Unspecified benign mammary dysplasia of left breast: Secondary | ICD-10-CM

## 2018-03-03 ENCOUNTER — Ambulatory Visit (INDEPENDENT_AMBULATORY_CARE_PROVIDER_SITE_OTHER): Payer: 59 | Admitting: Family Medicine

## 2018-03-03 ENCOUNTER — Other Ambulatory Visit (INDEPENDENT_AMBULATORY_CARE_PROVIDER_SITE_OTHER): Payer: 59

## 2018-03-03 VITALS — BP 124/84 | HR 84 | Temp 98.1°F | Resp 16 | Ht 63.0 in | Wt 185.0 lb

## 2018-03-03 DIAGNOSIS — I10 Essential (primary) hypertension: Secondary | ICD-10-CM

## 2018-03-03 DIAGNOSIS — E785 Hyperlipidemia, unspecified: Secondary | ICD-10-CM

## 2018-03-03 DIAGNOSIS — Z23 Encounter for immunization: Secondary | ICD-10-CM

## 2018-03-03 DIAGNOSIS — E119 Type 2 diabetes mellitus without complications: Secondary | ICD-10-CM | POA: Diagnosis not present

## 2018-03-03 LAB — TSH: TSH: 2.06 u[IU]/mL (ref 0.35–4.50)

## 2018-03-03 LAB — COMPREHENSIVE METABOLIC PANEL
ALT: 43 U/L — ABNORMAL HIGH (ref 0–35)
AST: 29 U/L (ref 0–37)
Albumin: 4.1 g/dL (ref 3.5–5.2)
Alkaline Phosphatase: 124 U/L — ABNORMAL HIGH (ref 39–117)
BUN: 13 mg/dL (ref 6–23)
CO2: 33 mEq/L — ABNORMAL HIGH (ref 19–32)
Calcium: 10.2 mg/dL (ref 8.4–10.5)
Chloride: 94 mEq/L — ABNORMAL LOW (ref 96–112)
Creatinine, Ser: 0.72 mg/dL (ref 0.40–1.20)
GFR: 88.17 mL/min (ref >60.00)
Glucose, Bld: 166 mg/dL — ABNORMAL HIGH (ref 70–99)
Potassium: 4.4 mEq/L (ref 3.5–5.1)
Sodium: 134 mEq/L — ABNORMAL LOW (ref 135–145)
TOTAL PROTEIN: 6.9 g/dL (ref 6.0–8.3)
Total Bilirubin: 0.3 mg/dL (ref 0.2–1.2)

## 2018-03-03 LAB — CBC
HCT: 38.7 % (ref 36.0–46.0)
Hemoglobin: 12.9 g/dL (ref 12.0–15.0)
MCHC: 33.3 g/dL (ref 30.0–36.0)
MCV: 92 fl (ref 78.0–100.0)
Platelets: 257 10*3/uL (ref 150.0–400.0)
RBC: 4.21 Mil/uL (ref 3.87–5.11)
RDW: 14.2 % (ref 11.5–15.5)
WBC: 8 10*3/uL (ref 4.0–10.5)

## 2018-03-03 LAB — LIPID PANEL
Cholesterol: 185 mg/dL (ref 0–200)
HDL: 64.1 mg/dL (ref >39.00)
LDL Cholesterol: 87 mg/dL (ref 0–99)
NonHDL: 121.04
Total CHOL/HDL Ratio: 3
Triglycerides: 169 mg/dL — ABNORMAL HIGH (ref 0.0–149.0)
VLDL: 33.8 mg/dL (ref 0.0–40.0)

## 2018-03-03 LAB — HEMOGLOBIN A1C: Hgb A1c MFr Bld: 7.5 % — ABNORMAL HIGH (ref 4.6–6.5)

## 2018-03-03 MED ORDER — METFORMIN HCL 1000 MG PO TABS: 1000.0000 mg | ORAL_TABLET | Freq: Two times a day (BID) | ORAL | 3 refills | Status: DC

## 2018-03-03 NOTE — Patient Instructions (Signed)
Per Dr. Charlett Blake ok to continue the same, return in 3 months for follow up and bp check with MD.

## 2018-03-03 NOTE — Progress Notes (Signed)
Patient here for one month follow up nurse visit for blood pressure change after HCTZ changed to Chlorthalidone.  BP today is 124/84 P 84 Per Dr. Charlett Blake ok to continue the same, return in 3 months for follow up and bp check with MD.  Nursing blood pressure check note reviewed. Agree with documention and plan.

## 2018-03-03 NOTE — Addendum Note (Signed)
Addended by: Magdalene Molly A on: 03/03/2018 04:38 PM   Modules accepted: Orders

## 2018-03-09 DIAGNOSIS — G4733 Obstructive sleep apnea (adult) (pediatric): Secondary | ICD-10-CM | POA: Diagnosis not present

## 2018-03-11 ENCOUNTER — Ambulatory Visit
Admission: RE | Admit: 2018-03-11 | Discharge: 2018-03-11 | Disposition: A | Discharge status: Home / Self Care | Payer: 59 | Source: Ambulatory Visit | Attending: General Surgery | Admitting: General Surgery

## 2018-03-11 DIAGNOSIS — N6092 Unspecified benign mammary dysplasia of left breast: Secondary | ICD-10-CM

## 2018-03-11 DIAGNOSIS — R922 Inconclusive mammogram: Secondary | ICD-10-CM | POA: Diagnosis not present

## 2018-04-09 DIAGNOSIS — G4733 Obstructive sleep apnea (adult) (pediatric): Secondary | ICD-10-CM | POA: Diagnosis not present

## 2018-04-28 ENCOUNTER — Telehealth: Payer: Self-pay | Admitting: Family Medicine

## 2018-04-28 MED ORDER — METFORMIN HCL 500 MG PO TABS: 500.0000 mg | ORAL_TABLET | Freq: Three times a day (TID) | ORAL | 1 refills | Status: DC

## 2018-04-28 NOTE — Telephone Encounter (Signed)
Medication sent in. 

## 2018-04-28 NOTE — Telephone Encounter (Signed)
Copied from [place] [id number]. Topic: Quick Communication - Rx Refill/Question >> Apr 28, 2018 11:27 AM Rayann Heman wrote: Medication: metFORMIN (GLUCOPHAGE) 1000 MG tablet [[id number]] pt states that she Is only taking 1500 mg a day and would like rx changed to 500 mg   Preferred Pharmacy (with phone number or street name):[facility] [place], [address] AT [address] [phone] (Phone) [phone] (Fax)

## 2018-05-05 ENCOUNTER — Ambulatory Visit: Payer: 59 | Admitting: Family Medicine

## 2018-05-08 DIAGNOSIS — G4733 Obstructive sleep apnea (adult) (pediatric): Secondary | ICD-10-CM | POA: Diagnosis not present

## 2018-05-16 ENCOUNTER — Ambulatory Visit: Payer: Self-pay | Admitting: Obstetrics & Gynecology

## 2018-05-20 ENCOUNTER — Ambulatory Visit: Payer: Self-pay | Admitting: Obstetrics & Gynecology

## 2018-06-08 DIAGNOSIS — G4733 Obstructive sleep apnea (adult) (pediatric): Secondary | ICD-10-CM | POA: Diagnosis not present

## 2018-06-20 LAB — HM DIABETES EYE EXAM

## 2018-06-24 ENCOUNTER — Telehealth: Payer: Self-pay | Admitting: *Deleted

## 2018-06-24 NOTE — Telephone Encounter (Signed)
Received Diabetic Eye Exam Report from [facility]; forwarded to provider/SLS 05/08

## 2018-07-08 DIAGNOSIS — G4733 Obstructive sleep apnea (adult) (pediatric): Secondary | ICD-10-CM | POA: Diagnosis not present

## 2018-07-12 ENCOUNTER — Other Ambulatory Visit: Payer: Self-pay

## 2018-07-12 ENCOUNTER — Encounter: Payer: Self-pay | Admitting: Obstetrics & Gynecology

## 2018-07-12 ENCOUNTER — Ambulatory Visit (INDEPENDENT_AMBULATORY_CARE_PROVIDER_SITE_OTHER): Payer: 59 | Admitting: Obstetrics & Gynecology

## 2018-07-12 VITALS — BP 130/88 | HR 92 | Temp 97.8°F | Ht 62.75 in | Wt 185.0 lb

## 2018-07-12 DIAGNOSIS — E2839 Other primary ovarian failure: Secondary | ICD-10-CM | POA: Diagnosis not present

## 2018-07-12 DIAGNOSIS — E119 Type 2 diabetes mellitus without complications: Secondary | ICD-10-CM

## 2018-07-12 DIAGNOSIS — Z01419 Encounter for gynecological examination (general) (routine) without abnormal findings: Secondary | ICD-10-CM | POA: Diagnosis not present

## 2018-07-12 NOTE — Progress Notes (Signed)
58 y.o. G0P0 Married Other or two or more races female here for annual exam.  Denies vaginal bleeding.    Patient's last menstrual period was 02/21/2014.          Sexually active: No.  The current method of family planning is post menopausal status.    Exercising: Yes.    cardio, strength training  Smoker:  no  Health Maintenance: Pap:  03/19/17 Neg. HR HPV:neg   09/07/14 Neg  History of abnormal Pap:  no  MMG:  03/11/18 BIRADS2:benign. F/u 1 year Colonoscopy:  11/02/12 polyps.  Aware this is due.  Did have it scheduled but then cancelled the appt BMD:   09/05/13 Normal.  Discussed repeating this with her mammogram TDaP:  2018 Pneumonia vaccine(s):  2017 Shingrix:   No Hep C testing: 12/06/15 Neg  Screening Labs: PCP   reports that she has never smoked. She has never used smokeless tobacco. She reports that she does not drink alcohol or use drugs.  Past Medical History:  Diagnosis Date  . Asthma    with respiratory infection  . Diabetes mellitus without complication ([place])    type II  . Fatty liver   . Fibroid uterus   . Hypertension   . Osteoarthritis 03/10/2015  . PONV (postoperative nausea and vomiting)    pt stopped breathing during colonoscopy  . Refusal of blood transfusions as patient is Jehovah's Witness    NO BLOOD PRODUCTS  . Sleep apnea     Past Surgical History:  Procedure Laterality Date  . BREAST CYST ASPIRATION    . BREAST EXCISIONAL BIOPSY    . CHOLECYSTECTOMY  1/11  . COLONOSCOPY    . DILATATION & CURRETTAGE/HYSTEROSCOPY WITH RESECTOCOPE N/A 03/13/2014   Procedure: DILATATION & CURETTAGE/HYSTEROSCOPY ;  Surgeon: Lyman Speller, MD;  Location: [facility];  Service: Gynecology;  Laterality: N/A;  . RADIOACTIVE SEED GUIDED EXCISIONAL BREAST BIOPSY Left 12/30/2016   Procedure: RADIOACTIVE SEED GUIDED EXCISIONAL BREAST BIOPSY;  Surgeon: Stark Klein, MD;  Location: [place];  Service: General;  Laterality: Left;    Current Outpatient  Medications  Medication Sig Dispense Refill  . albuterol (PROVENTIL HFA;VENTOLIN HFA) 108 (90 Base) MCG/ACT inhaler Inhale 1-2 puffs into the lungs every 4 (four) hours as needed for wheezing or shortness of breath. 18 g 2  . B-D ULTRA-FINE 33 LANCETS MISC Check BG 2 times/day    . glucose blood (ONE TOUCH ULTRA TEST) test strip Check BG 2 times/day.  Dx E11.65    . hydrochlorothiazide (HYDRODIURIL) 25 MG tablet TK 1 T PO D    . metFORMIN (GLUCOPHAGE) 500 MG tablet Take 1 tablet (500 mg total) by mouth 3 (three) times daily. 270 tablet 1  . Multiple Vitamin (MULTIVITAMIN) LIQD Take 5 mLs by mouth daily.     No current facility-administered medications for this visit.     Family History  Problem Relation Age of Onset  . Asthma Mother   . Diabetes Mother   . Diabetes Father   . Heart attack Father   . Diabetes Brother   . Diabetes Brother   . Asthma Brother   . Heart Problems Sister   . Asthma Sister   . Hypertension Sister   . Heart disease Maternal Grandfather   . Osteopenia Sister     Review of Systems  All other systems reviewed and are negative.   Exam:   BP 130/88   Pulse 92   Temp 97.8 F (36.6 C) (Temporal)  Ht 5' 2.75" (1.594 m)   Wt 185 lb (83.9 kg)   LMP 02/21/2014   BMI 33.03 kg/m     Height: 5' 2.75" (159.4 cm)  Ht Readings from Last 3 Encounters:  07/12/18 5' 2.75" (1.594 m)  03/03/18 5\' 3"  (1.6 m)  01/31/18 5\' 3"  (1.6 m)    General appearance: alert, cooperative and appears stated age Head: Normocephalic, without obvious abnormality, atraumatic Neck: no adenopathy, supple, symmetrical, trachea midline and thyroid normal to inspection and palpation Lungs: clear to auscultation bilaterally Breasts: normal appearance, no masses or tenderness Heart: regular rate and rhythm Abdomen: soft, non-tender; bowel sounds normal; no masses,  no organomegaly Extremities: extremities normal, atraumatic, no cyanosis or edema Skin: Skin color, texture, turgor  normal. No rashes or lesions Lymph nodes: Cervical, supraclavicular, and axillary nodes normal. No abnormal inguinal nodes palpated Neurologic: Grossly normal   Pelvic: External genitalia:  no lesions              Urethra:  normal appearing urethra with no masses, tenderness or lesions              Bartholins and Skenes: normal                 Vagina: normal appearing vagina with normal color and discharge, no lesions              Cervix: no lesions              Pap taken: No. Bimanual Exam:  Uterus:  normal size, contour, position, consistency, mobility, non-tender              Adnexa: normal adnexa and no mass, fullness, tenderness               Rectovaginal: Confirms               Anus:  normal sphincter tone, no lesions  Chaperone was present for exam.  A:  Well Woman with normal exam PMP, no HRT Hypertension H/o fibroids with uterus much smaller now H/o hysteroscopy with polyp resection 1/16 H/O atypical lobular hyperplasia, s/p lumpectomy 11/18 Jehovah's witness Type 2 Diabetes  P:   Mammogram guidelines reviewed pap smear with neg high risk HPV 2019 BMD ordered to do with MMG 02/2019 HbA1C obtained today.  She is going to call for follow up appt with endocrinologist but would like updated lab work. Aware colonoscopy due.  She did schedule this but cancelled it.   Return annually or prn

## 2018-07-13 LAB — HEMOGLOBIN A1C
Est. average glucose Bld gHb Est-mCnc: 154 mg/dL
Hgb A1c MFr Bld: 7 % — ABNORMAL HIGH (ref 4.8–5.6)

## 2018-07-25 ENCOUNTER — Encounter: Payer: Self-pay | Admitting: Family Medicine

## 2018-08-08 ENCOUNTER — Ambulatory Visit: Payer: 59 | Admitting: Family Medicine

## 2018-10-12 ENCOUNTER — Other Ambulatory Visit: Payer: Self-pay

## 2018-10-12 ENCOUNTER — Ambulatory Visit
Admission: RE | Admit: 2018-10-12 | Discharge: 2018-10-12 | Disposition: A | Discharge status: Home / Self Care | Payer: 59 | Source: Ambulatory Visit | Attending: Obstetrics & Gynecology | Admitting: Obstetrics & Gynecology

## 2018-10-12 DIAGNOSIS — E2839 Other primary ovarian failure: Secondary | ICD-10-CM

## 2018-12-12 ENCOUNTER — Encounter (INDEPENDENT_AMBULATORY_CARE_PROVIDER_SITE_OTHER): Payer: Self-pay

## 2019-02-01 ENCOUNTER — Other Ambulatory Visit: Payer: Self-pay

## 2019-02-02 ENCOUNTER — Ambulatory Visit (HOSPITAL_BASED_OUTPATIENT_CLINIC_OR_DEPARTMENT_OTHER)
Admission: RE | Admit: 2019-02-02 | Discharge: 2019-02-02 | Disposition: A | Discharge status: Home / Self Care | Payer: 59 | Source: Ambulatory Visit | Attending: Family Medicine | Admitting: Family Medicine

## 2019-02-02 ENCOUNTER — Ambulatory Visit (INDEPENDENT_AMBULATORY_CARE_PROVIDER_SITE_OTHER): Payer: 59 | Admitting: Family Medicine

## 2019-02-02 VITALS — BP 140/86 | HR 77 | Temp 95.4°F | Ht 62.75 in | Wt 188.6 lb

## 2019-02-02 DIAGNOSIS — M25571 Pain in right ankle and joints of right foot: Secondary | ICD-10-CM | POA: Insufficient documentation

## 2019-02-02 DIAGNOSIS — M79604 Pain in right leg: Secondary | ICD-10-CM | POA: Diagnosis present

## 2019-02-02 DIAGNOSIS — E669 Obesity, unspecified: Secondary | ICD-10-CM

## 2019-02-02 DIAGNOSIS — E785 Hyperlipidemia, unspecified: Secondary | ICD-10-CM | POA: Diagnosis not present

## 2019-02-02 DIAGNOSIS — E1165 Type 2 diabetes mellitus with hyperglycemia: Secondary | ICD-10-CM

## 2019-02-02 DIAGNOSIS — R202 Paresthesia of skin: Secondary | ICD-10-CM

## 2019-02-02 DIAGNOSIS — Z0000 Encounter for general adult medical examination without abnormal findings: Secondary | ICD-10-CM

## 2019-02-02 DIAGNOSIS — G4733 Obstructive sleep apnea (adult) (pediatric): Secondary | ICD-10-CM

## 2019-02-02 DIAGNOSIS — I10 Essential (primary) hypertension: Secondary | ICD-10-CM | POA: Diagnosis not present

## 2019-02-02 DIAGNOSIS — G8929 Other chronic pain: Secondary | ICD-10-CM

## 2019-02-02 DIAGNOSIS — M25552 Pain in left hip: Secondary | ICD-10-CM

## 2019-02-02 DIAGNOSIS — M79609 Pain in unspecified limb: Secondary | ICD-10-CM

## 2019-02-02 LAB — HEMOGLOBIN A1C: Hgb A1c MFr Bld: 7 % — ABNORMAL HIGH (ref 4.6–6.5)

## 2019-02-02 LAB — CBC
HCT: 39.5 % (ref 36.0–46.0)
Hemoglobin: 12.8 g/dL (ref 12.0–15.0)
MCHC: 32.5 g/dL (ref 30.0–36.0)
MCV: 93.3 fl (ref 78.0–100.0)
Platelets: 262 10*3/uL (ref 150.0–400.0)
RBC: 4.23 Mil/uL (ref 3.87–5.11)
RDW: 14.1 % (ref 11.5–15.5)
WBC: 8 10*3/uL (ref 4.0–10.5)

## 2019-02-02 LAB — COMPREHENSIVE METABOLIC PANEL
ALT: 49 U/L — ABNORMAL HIGH (ref 0–35)
AST: 34 U/L (ref 0–37)
Albumin: 4.1 g/dL (ref 3.5–5.2)
Alkaline Phosphatase: 104 U/L (ref 39–117)
BUN: 10 mg/dL (ref 6–23)
CO2: 29 mEq/L (ref 19–32)
Calcium: 9.9 mg/dL (ref 8.4–10.5)
Chloride: 98 mEq/L (ref 96–112)
Creatinine, Ser: 0.62 mg/dL (ref 0.40–1.20)
GFR: 98.27 mL/min (ref >60.00)
Glucose, Bld: 95 mg/dL (ref 70–99)
Potassium: 3.8 mEq/L (ref 3.5–5.1)
Sodium: 134 mEq/L — ABNORMAL LOW (ref 135–145)
Total Bilirubin: 0.3 mg/dL (ref 0.2–1.2)
Total Protein: 7.1 g/dL (ref 6.0–8.3)

## 2019-02-02 LAB — TSH: TSH: 2.06 u[IU]/mL (ref 0.35–4.50)

## 2019-02-02 LAB — LIPID PANEL
Cholesterol: 208 mg/dL — ABNORMAL HIGH (ref 0–200)
HDL: 59.9 mg/dL (ref >39.00)
LDL Cholesterol: 120 mg/dL — ABNORMAL HIGH (ref 0–99)
NonHDL: 148.55
Total CHOL/HDL Ratio: 3
Triglycerides: 145 mg/dL (ref 0.0–149.0)
VLDL: 29 mg/dL (ref 0.0–40.0)

## 2019-02-02 NOTE — Assessment & Plan Note (Signed)
Agrees to check weekly and call if any concerns. Did not take her meds today. She was rushing. no changes to meds. Encouraged heart healthy diet such as the DASH diet and exercise as tolerated.

## 2019-02-02 NOTE — Assessment & Plan Note (Signed)
Hit her ankle on lateral aspect since hitting it at home 6 months ago

## 2019-02-02 NOTE — Patient Instructions (Addendum)
Encouraged increased hydration and fiber in diet. Daily probiotics. If bowels not moving can use MOM 2 tbls po in 4 oz of warm prune juice by mouth every 2-3 days. If no results then repeat in 4 hours with  Dulcolax suppository pr, may repeat again in 4 more hours as needed. Seek care if symptoms worsen. Consider daily Miralax and/or Dulcolax if symptoms persist.   Consider daily Miralax with Benefiber  Or twice daily  selenium Preventive Care 79-59 Years Old, Female Preventive care refers to visits with your health care provider and lifestyle choices that can promote health and wellness. This includes:  A yearly physical exam. This may also be called an annual well check.  Regular dental visits and eye exams.  Immunizations.  Screening for certain conditions.  Healthy lifestyle choices, such as eating a healthy diet, getting regular exercise, not using drugs or products that contain nicotine and tobacco, and limiting alcohol use. What can I expect for my preventive care visit? Physical exam Your health care provider will check your:  Height and weight. This may be used to calculate body mass index (BMI), which tells if you are at a healthy weight.  Heart rate and blood pressure.  Skin for abnormal spots. Counseling Your health care provider may ask you questions about your:  Alcohol, tobacco, and drug use.  Emotional well-being.  Home and relationship well-being.  Sexual activity.  Eating habits.  Work and work Statistician.  Method of birth control.  Menstrual cycle.  Pregnancy history. What immunizations do I need?  Influenza (flu) vaccine  This is recommended every year. Tetanus, diphtheria, and pertussis (Tdap) vaccine  You may need a Td booster every 10 years. Varicella (chickenpox) vaccine  You may need this if you have not been vaccinated. Zoster (shingles) vaccine  You may need this after age 20. Measles, mumps, and rubella (MMR) vaccine  You may  need at least one dose of MMR if you were born in 1957 or later. You may also need a second dose. Pneumococcal conjugate (PCV13) vaccine  You may need this if you have certain conditions and were not previously vaccinated. Pneumococcal polysaccharide (PPSV23) vaccine  You may need one or two doses if you smoke cigarettes or if you have certain conditions. Meningococcal conjugate (MenACWY) vaccine  You may need this if you have certain conditions. Hepatitis A vaccine  You may need this if you have certain conditions or if you travel or work in places where you may be exposed to hepatitis A. Hepatitis B vaccine  You may need this if you have certain conditions or if you travel or work in places where you may be exposed to hepatitis B. Haemophilus influenzae type b (Hib) vaccine  You may need this if you have certain conditions. Human papillomavirus (HPV) vaccine  If recommended by your health care provider, you may need three doses over 6 months. You may receive vaccines as individual doses or as more than one vaccine together in one shot (combination vaccines). Talk with your health care provider about the risks and benefits of combination vaccines. What tests do I need? Blood tests  Lipid and cholesterol levels. These may be checked every 5 years, or more frequently if you are over 92 years old.  Hepatitis C test.  Hepatitis B test. Screening  Lung cancer screening. You may have this screening every year starting at age 62 if you have a 30-pack-year history of smoking and currently smoke or have quit within the past  15 years.  Colorectal cancer screening. All adults should have this screening starting at age 55 and continuing until age 91. Your health care provider may recommend screening at age 2 if you are at increased risk. You will have tests every 1-10 years, depending on your results and the type of screening test.  Diabetes screening. This is done by checking your blood  sugar (glucose) after you have not eaten for a while (fasting). You may have this done every 1-3 years.  Mammogram. This may be done every 1-2 years. Talk with your health care provider about when you should start having regular mammograms. This may depend on whether you have a family history of breast cancer.  BRCA-related cancer screening. This may be done if you have a family history of breast, ovarian, tubal, or peritoneal cancers.  Pelvic exam and Pap test. This may be done every 3 years starting at age 40. Starting at age 64, this may be done every 5 years if you have a Pap test in combination with an HPV test. Other tests  Sexually transmitted disease (STD) testing.  Bone density scan. This is done to screen for osteoporosis. You may have this scan if you are at high risk for osteoporosis. Follow these instructions at home: Eating and drinking  Eat a diet that includes fresh fruits and vegetables, whole grains, lean protein, and low-fat dairy.  Take vitamin and mineral supplements as recommended by your health care provider.  Do not drink alcohol if: ? Your health care provider tells you not to drink. ? You are pregnant, may be pregnant, or are planning to become pregnant.  If you drink alcohol: ? Limit how much you have to 0-1 drink a day. ? Be aware of how much alcohol is in your drink. In the [place] one drink equals one 12 oz bottle of beer (355 mL), one 5 oz glass of wine (148 mL), or one 1 oz glass of hard liquor (44 mL). Lifestyle  Take daily care of your teeth and gums.  Stay active. Exercise for at least 30 minutes on 5 or more days each week.  Do not use any products that contain nicotine or tobacco, such as cigarettes, e-cigarettes, and chewing tobacco. If you need help quitting, ask your health care provider.  If you are sexually active, practice safe sex. Use a condom or other form of birth control (contraception) in order to prevent pregnancy and STIs (sexually  transmitted infections).  If told by your health care provider, take low-dose aspirin daily starting at age 57. What's next?  Visit your health care provider once a year for a well check visit.  Ask your health care provider how often you should have your eyes and teeth checked.  Stay up to date on all vaccines. This information is not intended to replace advice given to you by your health care provider. Make sure you discuss any questions you have with your health care provider. Document Released: 03/01/2015 Document Revised: 10/14/2017 Document Reviewed: 10/14/2017 Elsevier Patient Education  2020 [employer].

## 2019-02-02 NOTE — Assessment & Plan Note (Signed)
hgba1c acceptable, minimize simple carbs. Increase exercise as tolerated. Continue current meds 

## 2019-02-02 NOTE — Assessment & Plan Note (Signed)
Check pelvic xray

## 2019-02-02 NOTE — Assessment & Plan Note (Signed)
Encouraged heart healthy diet, increase exercise, avoid trans fats, consider a krill oil cap daily. In September her Endocrinologist said her ldl was 146 and wanted to start a statin but she declined will recheck today

## 2019-02-02 NOTE — Assessment & Plan Note (Signed)
Using CPAP nightly 

## 2019-02-02 NOTE — Assessment & Plan Note (Signed)
Patient encouraged to maintain heart healthy diet, regular exercise, adequate sleep. Consider daily probiotics. Take medications as prescribed 

## 2019-02-02 NOTE — Assessment & Plan Note (Signed)
Encouraged DASH diet, decrease po intake and increase exercise as tolerated. Needs 7-8 hours of sleep nightly. Avoid trans fats, eat small, frequent meals every 4-5 hours with lean proteins, complex carbs and healthy fats. Minimize simple carbs, consider [employer] APP 

## 2019-02-05 DIAGNOSIS — M79609 Pain in unspecified limb: Secondary | ICD-10-CM | POA: Insufficient documentation

## 2019-02-05 DIAGNOSIS — R202 Paresthesia of skin: Secondary | ICD-10-CM | POA: Insufficient documentation

## 2019-02-05 NOTE — Progress Notes (Signed)
Subjective:    Patient ID: Yolanda [name], female    DOB: July 20, 1960, 58 y.o.   MRN: [id number]  Chief Complaint  Patient presents with  . Annual Exam    HPI Patient is in today for annual preventative exam and follow up on chronic medical concerns including hypertension, diabetes, and more. No recent febrile illness or hospitalizations. She has been trying to maintain quarantine when able. Is maintaining a heart healthy diet most days. Stays active is noting a numb spot on the front of her right thigh, no instigating injury or radicular nature. It comes and goes and is always circumscribed. Denies CP/palp/SOB/HA/congestion/fevers/GI or GU c/o. Taking meds as prescribed  Past Medical History:  Diagnosis Date  . Asthma    with respiratory infection  . Diabetes mellitus without complication ([place])    type II  . Fatty liver   . Fibroid uterus   . Hypertension   . Osteoarthritis 03/10/2015  . PONV (postoperative nausea and vomiting)    pt stopped breathing during colonoscopy  . Refusal of blood transfusions as patient is Jehovah's Witness    NO BLOOD PRODUCTS  . Sleep apnea     Past Surgical History:  Procedure Laterality Date  . BREAST CYST ASPIRATION    . BREAST EXCISIONAL BIOPSY    . CHOLECYSTECTOMY  1/11  . COLONOSCOPY    . DILATATION & CURRETTAGE/HYSTEROSCOPY WITH RESECTOCOPE N/A 03/13/2014   Procedure: DILATATION & CURETTAGE/HYSTEROSCOPY ;  Surgeon: Lyman Speller, MD;  Location: [facility];  Service: Gynecology;  Laterality: N/A;  . RADIOACTIVE SEED GUIDED EXCISIONAL BREAST BIOPSY Left 12/30/2016   Procedure: RADIOACTIVE SEED GUIDED EXCISIONAL BREAST BIOPSY;  Surgeon: Stark Klein, MD;  Location: [facility];  Service: General;  Laterality: Left;    Family History  Problem Relation Age of Onset  . Asthma Mother   . Diabetes Mother   . Diabetes Father   . Heart attack Father   . Diabetes Brother   . Diabetes Brother   . Asthma Brother   . Heart  Problems Sister   . Asthma Sister   . Hypertension Sister   . Heart disease Maternal Grandfather   . Osteopenia Sister     Social History   Socioeconomic History  . Marital status: Married    Spouse name: Not on file  . Number of children: Not on file  . Years of education: Not on file  . Highest education level: Not on file  Occupational History  . Not on file  Tobacco Use  . Smoking status: Never Smoker  . Smokeless tobacco: Never Used  Substance and Sexual Activity  . Alcohol use: No  . Drug use: No  . Sexual activity: Not Currently    Partners: Male    Birth control/protection: Post-menopausal  Other Topics Concern  . Not on file  Social History Narrative  . Not on file   Social Determinants of Health   Financial Resource Strain:   . Difficulty of Paying Living Expenses: Not on file  Food Insecurity:   . Worried About Charity fundraiser in the Last Year: Not on file  . Ran Out of Food in the Last Year: Not on file  Transportation Needs:   . Lack of Transportation (Medical): Not on file  . Lack of Transportation (Non-Medical): Not on file  Physical Activity:   . Days of Exercise per Week: Not on file  . Minutes of Exercise per Session: Not on file  Stress:   .  Feeling of Stress : Not on file  Social Connections:   . Frequency of Communication with Friends and Family: Not on file  . Frequency of Social Gatherings with Friends and Family: Not on file  . Attends Religious Services: Not on file  . Active Member of Clubs or Organizations: Not on file  . Attends Archivist Meetings: Not on file  . Marital Status: Not on file  Intimate Partner Violence:   . Fear of Current or Ex-Partner: Not on file  . Emotionally Abused: Not on file  . Physically Abused: Not on file  . Sexually Abused: Not on file    Outpatient Medications Prior to Visit  Medication Sig Dispense Refill  . albuterol (PROVENTIL HFA;VENTOLIN HFA) 108 (90 Base) MCG/ACT inhaler Inhale  1-2 puffs into the lungs every 4 (four) hours as needed for wheezing or shortness of breath. 18 g 2  . B-D ULTRA-FINE 33 LANCETS MISC Check BG 2 times/day    . glucose blood (ONE TOUCH ULTRA TEST) test strip Check BG 2 times/day.  Dx E11.65    . hydrochlorothiazide (HYDRODIURIL) 25 MG tablet TK 1 T PO D    . metFORMIN (GLUCOPHAGE) 500 MG tablet Take 1 tablet (500 mg total) by mouth 3 (three) times daily. 270 tablet 1  . Multiple Vitamin (MULTIVITAMIN) LIQD Take 5 mLs by mouth daily.     No facility-administered medications prior to visit.    Allergies  Allergen Reactions  . Lisinopril Cough    With SOB and wheezing    Review of Systems  Constitutional: Negative for chills, fever and malaise/fatigue.  HENT: Negative for congestion and hearing loss.   Eyes: Negative for discharge.  Respiratory: Negative for cough, sputum production and shortness of breath.   Cardiovascular: Negative for chest pain, palpitations and leg swelling.  Gastrointestinal: Negative for abdominal pain, blood in stool, constipation, diarrhea, heartburn, nausea and vomiting.  Genitourinary: Negative for dysuria, frequency, hematuria and urgency.  Musculoskeletal: Negative for back pain, falls and myalgias.  Skin: Negative for rash.  Neurological: Positive for sensory change. Negative for dizziness, loss of consciousness, weakness and headaches.  Endo/Heme/Allergies: Negative for environmental allergies. Does not bruise/bleed easily.  Psychiatric/Behavioral: Negative for depression and suicidal ideas. The patient is not nervous/anxious and does not have insomnia.        Objective:    Physical Exam Constitutional:      General: She is not in acute distress.    Appearance: She is not diaphoretic.  HENT:     Head: Normocephalic and atraumatic.     Right Ear: External ear normal.     Left Ear: External ear normal.     Nose: Nose normal.     Mouth/Throat:     Pharynx: No oropharyngeal exudate.  Eyes:      General: No scleral icterus.       Right eye: No discharge.        Left eye: No discharge.     Conjunctiva/sclera: Conjunctivae normal.     Pupils: Pupils are equal, round, and reactive to light.  Neck:     Thyroid: No thyromegaly.  Cardiovascular:     Rate and Rhythm: Normal rate and regular rhythm.     Heart sounds: Normal heart sounds. No murmur.  Pulmonary:     Effort: Pulmonary effort is normal. No respiratory distress.     Breath sounds: Normal breath sounds. No wheezing or rales.  Abdominal:     General: Bowel sounds are normal. There  is no distension.     Palpations: Abdomen is soft. There is no mass.     Tenderness: There is no abdominal tenderness.  Musculoskeletal:        General: No tenderness. Normal range of motion.     Cervical back: Normal range of motion and neck supple.  Lymphadenopathy:     Cervical: No cervical adenopathy.  Skin:    General: Skin is warm and dry.     Findings: No rash.  Neurological:     Mental Status: She is alert and oriented to person, place, and time.     Cranial Nerves: No cranial nerve deficit.     Coordination: Coordination normal.     Deep Tendon Reflexes: Reflexes are normal and symmetric. Reflexes normal.     BP 140/86   Pulse 77   Temp (!) 95.4 F (35.2 C) (Skin)   Ht 5' 2.75" (1.594 m)   Wt 188 lb 9.6 oz (85.5 kg)   LMP 02/21/2014   SpO2 98%   BMI 33.68 kg/m  Wt Readings from Last 3 Encounters:  02/02/19 188 lb 9.6 oz (85.5 kg)  07/12/18 185 lb (83.9 kg)  03/03/18 185 lb (83.9 kg)    Diabetic Foot Exam - Simple   No data filed     Lab Results  Component Value Date   WBC 8.0 02/02/2019   HGB 12.8 02/02/2019   HCT 39.5 02/02/2019   PLT 262.0 02/02/2019   GLUCOSE 95 02/02/2019   CHOL 208 (H) 02/02/2019   TRIG 145.0 02/02/2019   HDL 59.90 02/02/2019   LDLDIRECT 128.9 07/14/2006   LDLCALC 120 (H) 02/02/2019   ALT 49 (H) 02/02/2019   AST 34 02/02/2019   NA 134 (L) 02/02/2019   K 3.8 02/02/2019   CL 98  02/02/2019   CREATININE 0.62 02/02/2019   BUN 10 02/02/2019   CO2 29 02/02/2019   TSH 2.06 02/02/2019   HGBA1C 7.0 (H) 02/02/2019   MICROALBUR 26 11/04/2017    Lab Results  Component Value Date   TSH 2.06 02/02/2019   Lab Results  Component Value Date   WBC 8.0 02/02/2019   HGB 12.8 02/02/2019   HCT 39.5 02/02/2019   MCV 93.3 02/02/2019   PLT 262.0 02/02/2019   Lab Results  Component Value Date   NA 134 (L) 02/02/2019   K 3.8 02/02/2019   CO2 29 02/02/2019   GLUCOSE 95 02/02/2019   BUN 10 02/02/2019   CREATININE 0.62 02/02/2019   BILITOT 0.3 02/02/2019   ALKPHOS 104 02/02/2019   AST 34 02/02/2019   ALT 49 (H) 02/02/2019   PROT 7.1 02/02/2019   ALBUMIN 4.1 02/02/2019   CALCIUM 9.9 02/02/2019   ANIONGAP 7 12/28/2016   GFR 98.27 02/02/2019   Lab Results  Component Value Date   CHOL 208 (H) 02/02/2019   Lab Results  Component Value Date   HDL 59.90 02/02/2019   Lab Results  Component Value Date   LDLCALC 120 (H) 02/02/2019   Lab Results  Component Value Date   TRIG 145.0 02/02/2019   Lab Results  Component Value Date   CHOLHDL 3 02/02/2019   Lab Results  Component Value Date   HGBA1C 7.0 (H) 02/02/2019       Assessment & Plan:   Problem List Items Addressed This Visit    OBESITY    Encouraged DASH diet, decrease po intake and increase exercise as tolerated. Needs 7-8 hours of sleep nightly. Avoid trans fats, eat small, frequent  meals every 4-5 hours with lean proteins, complex carbs and healthy fats. Minimize simple carbs, consider [employer] APP      OBSTRUCTIVE SLEEP APNEA    Using CPAP nightly      Essential hypertension - Primary    Agrees to check weekly and call if any concerns. Did not take her meds today. She was rushing. no changes to meds. Encouraged heart healthy diet such as the DASH diet and exercise as tolerated.       Relevant Orders   CBC (Completed)   CMP (Completed)   TSH (Completed)   Diabetes (HCC)    hgba1c acceptable,  minimize simple carbs. Increase exercise as tolerated. Continue current meds      Relevant Orders   A1C (Completed)   Hyperlipidemia    Encouraged heart healthy diet, increase exercise, avoid trans fats, consider a krill oil cap daily. In September her Endocrinologist said her ldl was 146 and wanted to start a statin but she declined will recheck today      Relevant Orders   Lipid panel (Completed)   Preventative health care    Patient encouraged to maintain heart healthy diet, regular exercise, adequate sleep. Consider daily probiotics. Take medication s as prescribed.      Right ankle pain    Hit her ankle on lateral aspect since hitting it at home 6 months ago      Left hip pain    Check pelvic xray      Relevant Orders   DG Pelvis 1-2 Views (Completed)   Paresthesia and pain of right extremity    Leg, anterior right thigh, comes and goes is numb at times and uncomfortable at other times. No injury or weakness. Very circumscribed spot so likely a superficial cutaneous nerve. She will report if worsens.       Other Visit Diagnoses    Right leg pain       Relevant Orders   DG Pelvis 1-2 Views (Completed)      I am having Revonda Humphrey maintain her glucose blood, B-D ULTRA-FINE 33 LANCETS, multivitamin, albuterol, metFORMIN, and hydrochlorothiazide.  No orders of the defined types were placed in this encounter.    Penni Homans, MD

## 2019-02-05 NOTE — Assessment & Plan Note (Signed)
Leg, anterior right thigh, comes and goes is numb at times and uncomfortable at other times. No injury or weakness. Very circumscribed spot so likely a superficial cutaneous nerve. She will report if worsens.

## 2019-02-11 ENCOUNTER — Encounter: Payer: Self-pay | Admitting: Family Medicine

## 2019-03-02 ENCOUNTER — Other Ambulatory Visit: Payer: Self-pay | Admitting: Family Medicine

## 2019-03-02 ENCOUNTER — Other Ambulatory Visit: Payer: Self-pay | Admitting: General Surgery

## 2019-03-02 DIAGNOSIS — Z1231 Encounter for screening mammogram for malignant neoplasm of breast: Secondary | ICD-10-CM

## 2019-04-04 ENCOUNTER — Ambulatory Visit
Admission: RE | Admit: 2019-04-04 | Discharge: 2019-04-04 | Disposition: A | Discharge status: Home / Self Care | Payer: 59 | Source: Ambulatory Visit | Attending: Family Medicine | Admitting: Family Medicine

## 2019-04-04 ENCOUNTER — Other Ambulatory Visit: Payer: Self-pay

## 2019-04-04 DIAGNOSIS — Z1231 Encounter for screening mammogram for malignant neoplasm of breast: Secondary | ICD-10-CM

## 2019-04-05 ENCOUNTER — Other Ambulatory Visit: Payer: Self-pay | Admitting: Family Medicine

## 2019-04-05 DIAGNOSIS — N6489 Other specified disorders of breast: Secondary | ICD-10-CM

## 2019-04-18 ENCOUNTER — Other Ambulatory Visit: Payer: Self-pay

## 2019-04-18 ENCOUNTER — Ambulatory Visit: Payer: 59

## 2019-04-18 ENCOUNTER — Ambulatory Visit
Admission: RE | Admit: 2019-04-18 | Discharge: 2019-04-18 | Disposition: A | Discharge status: Home / Self Care | Payer: 59 | Source: Ambulatory Visit | Attending: Family Medicine | Admitting: Family Medicine

## 2019-04-18 DIAGNOSIS — N6489 Other specified disorders of breast: Secondary | ICD-10-CM

## 2019-05-14 ENCOUNTER — Encounter: Payer: Self-pay | Admitting: Family Medicine

## 2019-05-15 ENCOUNTER — Encounter (HOSPITAL_BASED_OUTPATIENT_CLINIC_OR_DEPARTMENT_OTHER): Payer: Self-pay

## 2019-05-15 ENCOUNTER — Other Ambulatory Visit: Payer: Self-pay

## 2019-05-15 ENCOUNTER — Emergency Department (HOSPITAL_BASED_OUTPATIENT_CLINIC_OR_DEPARTMENT_OTHER): Payer: 59

## 2019-05-15 ENCOUNTER — Emergency Department (HOSPITAL_BASED_OUTPATIENT_CLINIC_OR_DEPARTMENT_OTHER)
Admission: EM | Admit: 2019-05-15 | Discharge: 2019-05-15 | Disposition: A | Discharge status: Home / Self Care | Payer: 59 | Attending: Emergency Medicine | Admitting: Emergency Medicine

## 2019-05-15 DIAGNOSIS — R079 Chest pain, unspecified: Secondary | ICD-10-CM | POA: Diagnosis not present

## 2019-05-15 DIAGNOSIS — E119 Type 2 diabetes mellitus without complications: Secondary | ICD-10-CM | POA: Insufficient documentation

## 2019-05-15 DIAGNOSIS — I10 Essential (primary) hypertension: Secondary | ICD-10-CM | POA: Insufficient documentation

## 2019-05-15 LAB — CBC
HCT: 41 % (ref 36.0–46.0)
Hemoglobin: 13.2 g/dL (ref 12.0–15.0)
MCH: 30.1 pg (ref 26.0–34.0)
MCHC: 32.2 g/dL (ref 30.0–36.0)
MCV: 93.4 fL (ref 80.0–100.0)
Platelets: 314 10*3/uL (ref 150–400)
RBC: 4.39 MIL/uL (ref 3.87–5.11)
RDW: 13.2 % (ref 11.5–15.5)
WBC: 9.8 10*3/uL (ref 4.0–10.5)
nRBC: 0 % (ref 0.0–0.2)

## 2019-05-15 LAB — COMPREHENSIVE METABOLIC PANEL
ALT: 43 U/L (ref 0–44)
AST: 31 U/L (ref 15–41)
Albumin: 4.1 g/dL (ref 3.5–5.0)
Alkaline Phosphatase: 97 U/L (ref 38–126)
Anion gap: 11 (ref 5–15)
BUN: 13 mg/dL (ref 6–20)
CO2: 27 mmol/L (ref 22–32)
Calcium: 10.3 mg/dL (ref 8.9–10.3)
Chloride: 95 mmol/L — ABNORMAL LOW (ref 98–111)
Creatinine, Ser: 0.67 mg/dL (ref 0.44–1.00)
GFR calc Af Amer: >60 mL/min (ref >60)
GFR calc non Af Amer: >60 mL/min (ref >60)
Glucose, Bld: 116 mg/dL — ABNORMAL HIGH (ref 70–99)
Potassium: 3.8 mmol/L (ref 3.5–5.1)
Sodium: 133 mmol/L — ABNORMAL LOW (ref 135–145)
Total Bilirubin: 0.5 mg/dL (ref 0.3–1.2)
Total Protein: 7.7 g/dL (ref 6.5–8.1)

## 2019-05-15 LAB — TROPONIN I (HIGH SENSITIVITY): Troponin I (High Sensitivity): 4 ng/L (ref <18)

## 2019-05-15 MED ORDER — OMEPRAZOLE 20 MG PO CPDR: 20.0000 mg | DELAYED_RELEASE_CAPSULE | Freq: Every day | ORAL | 1 refills | Status: DC

## 2019-05-15 MED ORDER — ALUM & MAG HYDROXIDE-SIMETH 200-200-20 MG/5ML PO SUSP
30.0000 mL | Freq: Once | ORAL | Status: AC
  Administered 2019-05-15: 30 mL via ORAL
  Filled 2019-05-15: qty 30

## 2019-05-15 MED ORDER — LIDOCAINE VISCOUS HCL 2 % MT SOLN
15.0000 mL | Freq: Once | OROMUCOSAL | Status: AC
  Administered 2019-05-15: 15 mL via ORAL
  Filled 2019-05-15: qty 15

## 2019-05-15 MED ORDER — SUCRALFATE 1 G PO TABS: 1.0000 g | ORAL_TABLET | Freq: Four times a day (QID) | ORAL | 0 refills | Status: DC | PRN

## 2019-05-15 NOTE — Telephone Encounter (Signed)
LM requesting call back.  

## 2019-05-15 NOTE — Telephone Encounter (Signed)
Called patient 3 times left detailed message the 3rd time for patient to go to the ER if she is still experiencing chest pain, and for her to call the office back if she can

## 2019-05-15 NOTE — ED Triage Notes (Signed)
Pt arrives to ED with c/o CP for a few weeks. Pt states that the pain starts in her throat and goes into her chest. Denies radiation, denies NV, denies SOB.

## 2019-05-15 NOTE — ED Provider Notes (Signed)
[facility] Emergency Department Provider Note MRN:  [id number]  Arrival date & time: 05/15/19     Chief Complaint   Chest Pain   History of Present Illness   Yolanda [name] is a 59 y.o. year-old female with a history of diabetes, hypertension presenting to the ED with chief complaint of chest pain.  Intermittent chest pain for a few weeks, worse after meals.  Also endorsing a protruding mass on her abdomen that she thinks is a hernia.  Denies vomiting or diarrhea, no abdominal pain, no other complaints.  Currently with mild pressure-like pain in the chest.  Review of Systems  A complete 10 system review of systems was obtained and all systems are negative except as noted in the HPI and PMH.   Patient's Health History    Past Medical History:  Diagnosis Date  . Asthma    with respiratory infection  . Diabetes mellitus without complication ([place])    type II  . Fatty liver   . Fibroid uterus   . Hypertension   . Osteoarthritis 03/10/2015  . PONV (postoperative nausea and vomiting)    pt stopped breathing during colonoscopy  . Refusal of blood transfusions as patient is Jehovah's Witness    NO BLOOD PRODUCTS  . Sleep apnea     Past Surgical History:  Procedure Laterality Date  . BREAST CYST ASPIRATION    . BREAST EXCISIONAL BIOPSY    . CHOLECYSTECTOMY  1/11  . COLONOSCOPY    . DILATATION & CURRETTAGE/HYSTEROSCOPY WITH RESECTOCOPE N/A 03/13/2014   Procedure: DILATATION & CURETTAGE/HYSTEROSCOPY ;  Surgeon: Lyman Speller, MD;  Location: [facility];  Service: Gynecology;  Laterality: N/A;  . RADIOACTIVE SEED GUIDED EXCISIONAL BREAST BIOPSY Left 12/30/2016   Procedure: RADIOACTIVE SEED GUIDED EXCISIONAL BREAST BIOPSY;  Surgeon: Stark Klein, MD;  Location: [place];  Service: General;  Laterality: Left;    Family History  Problem Relation Age of Onset  . Asthma Mother   . Diabetes Mother   . Diabetes Father   . Heart attack  Father   . Diabetes Brother   . Diabetes Brother   . Asthma Brother   . Heart Problems Sister   . Asthma Sister   . Hypertension Sister   . Heart disease Maternal Grandfather   . Osteopenia Sister     Social History   Socioeconomic History  . Marital status: Married    Spouse name: Not on file  . Number of children: Not on file  . Years of education: Not on file  . Highest education level: Not on file  Occupational History  . Not on file  Tobacco Use  . Smoking status: Never Smoker  . Smokeless tobacco: Never Used  Substance and Sexual Activity  . Alcohol use: No  . Drug use: No  . Sexual activity: Not Currently    Partners: Male    Birth control/protection: Post-menopausal  Other Topics Concern  . Not on file  Social History Narrative  . Not on file   Social Determinants of Health   Financial Resource Strain:   . Difficulty of Paying Living Expenses:   Food Insecurity:   . Worried About Charity fundraiser in the Last Year:   . Arboriculturist in the Last Year:   Transportation Needs:   . Film/video editor (Medical):   Marland Kitchen Lack of Transportation (Non-Medical):   Physical Activity:   . Days of Exercise per Week:   .  Minutes of Exercise per Session:   Stress:   . Feeling of Stress :   Social Connections:   . Frequency of Communication with Friends and Family:   . Frequency of Social Gatherings with Friends and Family:   . Attends Religious Services:   . Active Member of Clubs or Organizations:   . Attends Archivist Meetings:   Marland Kitchen Marital Status:   Intimate Partner Violence:   . Fear of Current or Ex-Partner:   . Emotionally Abused:   Marland Kitchen Physically Abused:   . Sexually Abused:      Physical Exam   Vitals:   05/15/19 1756 05/15/19 1900  BP: (!) 134/91 (!) 136/96  Pulse: 92 72  Resp: 18 17  Temp: 98.5 F (36.9 C)   SpO2: 98% 96%    CONSTITUTIONAL: Well-appearing, NAD NEURO:  Alert and oriented x 3, no focal deficits EYES:  eyes equal  and reactive ENT/NECK:  no LAD, no JVD CARDIO: Regular rate, well-perfused, normal S1 and S2 PULM:  CTAB no wheezing or rhonchi GI/GU:  normal bowel sounds, non-distended, non-tender, palpable ventral hernia above the umbilicus, easily reducible and nontender, no skin changes MSK/SPINE:  No gross deformities, no edema SKIN:  no rash, atraumatic PSYCH:  Appropriate speech and behavior  *Additional and/or pertinent findings included in MDM below  Diagnostic and Interventional Summary    EKG Interpretation  Date/Time:  Monday May 15 2019 18:05:05 EDT Ventricular Rate:  77 PR Interval:    QRS Duration: 90 QT Interval:  394 QTC Calculation: 446 R Axis:   18 Text Interpretation: Sinus rhythm Baseline wander in lead(s) V1 V2 Confirmed by Gerlene Fee [phone]) on 05/15/2019 6:25:38 PM      Labs Reviewed  COMPREHENSIVE METABOLIC PANEL - Abnormal; Notable for the following components:      Result Value   Sodium 133 (*)    Chloride 95 (*)    Glucose, Bld 116 (*)    All other components within normal limits  CBC  TROPONIN I (HIGH SENSITIVITY)    DG Chest Port 1 View  Final Result      Medications  alum & mag hydroxide-simeth (MAALOX/MYLANTA) 200-200-20 MG/5ML suspension 30 mL (30 mLs Oral Given 05/15/19 1834)    And  lidocaine (XYLOCAINE) 2 % viscous mouth solution 15 mL (15 mLs Oral Given 05/15/19 1834)     Procedures  /  Critical Care Procedures  ED Course and Medical Decision Making  I have reviewed the triage vital signs, the nursing notes, and pertinent available records from the EMR.  Pertinent labs & imaging results that were available during my care of the patient were reviewed by me and considered in my medical decision making (see below for details).     Suspect GI etiology of chest pain given it being worse after meals.  Given age and risk factors will screen with EKG and troponin.  Troponin is negative, EKG reassuring, chest x-ray normal.  Will provide  medications to help with likely GI etiology of pain, patient advised to follow-up with PCP to discuss need for outpatient stress testing.  [name]. Sedonia Small, [place] [email or web address]  Final Clinical Impressions(s) / ED Diagnoses     ICD-10-CM   1. Chest pain, unspecified type  R07.9     ED Discharge Orders         Ordered    omeprazole (PRILOSEC) 20 MG capsule  Daily     05/15/19 1936  sucralfate (CARAFATE) 1 g tablet  4 times daily PRN     05/15/19 1936           Discharge Instructions Discussed with and Provided to Patient:     Discharge Instructions     You were evaluated in the Emergency Department and after careful evaluation, we did not find any emergent condition requiring admission or further testing in the hospital.  Your exam/testing today is overall reassuring.  Suspect that your pain is related to acid reflux.  Please take the omeprazole and Carafate medications as we discussed.  Please return to the Emergency Department if you experience any worsening of your condition.  We encourage you to follow up with a primary care provider.  Thank you for allowing [place] to be a part of your care.       Maudie Flakes, MD 05/15/19 [phone]

## 2019-05-15 NOTE — Discharge Instructions (Addendum)
You were evaluated in the Emergency Department and after careful evaluation, we did not find any emergent condition requiring admission or further testing in the hospital.  Your exam/testing today is overall reassuring.  Suspect that your pain is related to acid reflux.  Please take the omeprazole and Carafate medications as we discussed.  Please return to the Emergency Department if you experience any worsening of your condition.  We encourage you to follow up with a primary care provider.  Thank you for allowing [place] to be a part of your care.

## 2019-05-16 NOTE — Telephone Encounter (Signed)
Patient went to the ER 

## 2019-06-20 ENCOUNTER — Telehealth: Payer: Self-pay

## 2019-06-20 NOTE — Telephone Encounter (Signed)
Patient called in to speak with DR. Charlett Blake or the nurse about her acid reflex condition please call the patient back at [phone]

## 2019-06-21 ENCOUNTER — Other Ambulatory Visit: Payer: Self-pay

## 2019-06-21 ENCOUNTER — Telehealth (INDEPENDENT_AMBULATORY_CARE_PROVIDER_SITE_OTHER): Payer: 59 | Admitting: Medical

## 2019-06-21 ENCOUNTER — Telehealth: Payer: Self-pay | Admitting: Medical

## 2019-06-21 DIAGNOSIS — K219 Gastro-esophageal reflux disease without esophagitis: Secondary | ICD-10-CM | POA: Diagnosis not present

## 2019-06-21 MED ORDER — SUCRALFATE 1 G PO TABS: 1.0000 g | ORAL_TABLET | Freq: Three times a day (TID) | ORAL | 0 refills | Status: DC

## 2019-06-21 MED ORDER — FAMOTIDINE 20 MG PO TABS: 20.0000 mg | ORAL_TABLET | Freq: Every day | ORAL | 1 refills | Status: DC

## 2019-06-21 MED ORDER — OMEPRAZOLE 40 MG PO CPDR: 40.0000 mg | DELAYED_RELEASE_CAPSULE | Freq: Every day | ORAL | 3 refills | Status: DC

## 2019-06-21 NOTE — Telephone Encounter (Signed)
Patient stated that she went to the ER on 3/29 and she is still have vomiting and would like to know what would be the next step.  They told her that she may have heart burn.  Follow up made with Percell Miller for today.

## 2019-06-21 NOTE — Telephone Encounter (Signed)
Patient states she doesn't have chest pain and she was told its acid reflux , isnt having severe chest pain , but occasional flare ups. Patient states she would like to keep her virtual at 15.

## 2019-06-21 NOTE — Telephone Encounter (Signed)
I saw her video visit for possible gerd. ED did cardiac work up. Will you call pt and advise I would prefer in office visit in event need ekg. Go ahead and call her before video.  If still insists then will do video but not best in light of abdomen pain and ED reported intermittent chest pain.

## 2019-06-21 NOTE — Patient Instructions (Signed)
You have atypical description of low GERD type symptoms.  But on review your symptoms do decrease with Carafate, omeprazole and strict diet.   I will restart you on the omeprazole, Carafate and add famotidine.  You mentioned that pharmacy gave you 20 mg dose of omeprazole so I am going to prescribe 40 mg today.  Continue strict diet.  I will go ahead and place referral to gastroenterologist.  They will likely need to do endoscopy.  Due to the location of your atypical pain, I did want you to have an office visit today but that was declined.  Also discussed with you to watch your cell closely for any worsening or changing signs symptoms.  If so then recommend ED evaluation to get repeat work-up.  Follow-up in 7 to 10 days or as needed.  Also asking that she send me a MyChart update in about 2 to 3 days so we can verify that symptoms are improving with treatment.

## 2019-06-21 NOTE — Progress Notes (Signed)
   Subjective:    Patient ID: Yolanda [name], female    DOB: 06-17-1960, 59 y.o.   MRN: [id number]  HPI  Virtual Visit via Video Note  I connected with Yolanda [name] on 06/21/19 at 11:00 AM EDT by a video enabled telemedicine application and verified that I am speaking with the correct person using two identifiers.  Location: Patient: home Provider: office   I discussed the limitations of evaluation and management by telemedicine and the availability of in person appointments. The patient expressed understanding and agreed to proceed.  History of Present Illness:  Pt in for follow up from ED.   Pt worse worked up for some chest pain. I asked MA [place] to call pt and advise to come in for possible ekg. She declined in office visit.  Pt had ED evaluation  on 05-15-2019 and had negative. Pt was given omeprazole and carafate.and it did help. She is not on those presently.  Pt states taking tums and alka seltzer now. She is also eating very strict diet but at times will break diet. Burps a lot. States when has discomfort feels like  Inflated balloon.  Pt never has associated cardiac symptoms but does point to mid chest.Symptoms after eating or drinking. Even water.  Observations/Objective: General-no acute distress, pleasant, oriented. Lungs- on inspection lungs appear unlabored. Neck- no tracheal deviation or jvd on inspection. Neuro- gross motor function appears intact.   Assessment and Plan: You have atypical description of probable GERD type symptoms.  But on review your symptoms do decrease with Carafate, omeprazole and strict diet.   I will restart you on the omeprazole, Carafate and add famotidine.  You mentioned that pharmacy gave you 20 mg dose of omeprazole so I am going to prescribe 40 mg today.  Continue strict diet.  I will go ahead and place referral to gastroenterologist.  They will likely need to do endoscopy.  Due to the location of your atypical pain, I did  want you to have an office visit today but that was declined.  Also discussed with you to watch your cell closely for any worsening or changing signs symptoms.  If so then recommend ED evaluation to get repeat work-up.  Follow-up in 7 to 10 days or as needed.  Also asking that she send me a MyChart update in about 2 to 3 days so we can verify that symptoms are improving with treatment.  Follow Up Instructions:    I discussed the assessment and treatment plan with the patient. The patient was provided an opportunity to ask questions and all were answered. The patient agreed with the plan and demonstrated an understanding of the instructions.   The patient was advised to call back or seek an in-person evaluation if the symptoms worsen or if the condition fails to improve as anticipated.  Time spent with patient today was 25  minutes which consisted of chart review, discussing diagnosis, work up, treatment and documentation.   Mackie Pai, PA-C    Review of Systems     Objective:   Physical Exam        Assessment & Plan:

## 2019-07-03 ENCOUNTER — Telehealth: Payer: Self-pay | Admitting: Family Medicine

## 2019-07-03 NOTE — Telephone Encounter (Signed)
I do recommend that she get back on all meds that I prescribed. It was working and I think pain coming back indicates needs to keep appointment with GI.

## 2019-07-03 NOTE — Telephone Encounter (Signed)
Pt wants to let Yolanda [name] know the meds for acid reflux was working.  However 5 days later her abdominal started hurting. She wants to know should she keep taking meds or should she discontinue. Please advise

## 2019-07-04 NOTE — Telephone Encounter (Signed)
Called pt and lvm to return call.  

## 2019-07-06 LAB — HM DIABETES EYE EXAM

## 2019-07-07 NOTE — Telephone Encounter (Signed)
Tried to contact patient, phone was ringing then went busy.

## 2019-07-14 NOTE — Telephone Encounter (Signed)
Called the patient left message to call back 

## 2019-07-24 IMAGING — MG DIGITAL DIAGNOSTIC BILATERAL MAMMOGRAM WITH TOMO AND CAD
6 of 10 series · 6 of 30 positions shown · non-contrast
Comparison: Previous exam(s).

CLINICAL DATA: Patient presents for bilateral diagnostic
examination due to a history of discordant needle biopsy left breast
with subsequent excisional biopsy upper outer left breast
demonstrating ALH. Patient is due for her annual bilateral
mammogram.

EXAM:
DIGITAL DIAGNOSTIC BILATERAL MAMMOGRAM WITH CAD AND TOMO

[L MLO synth-2D (1 of 2)]
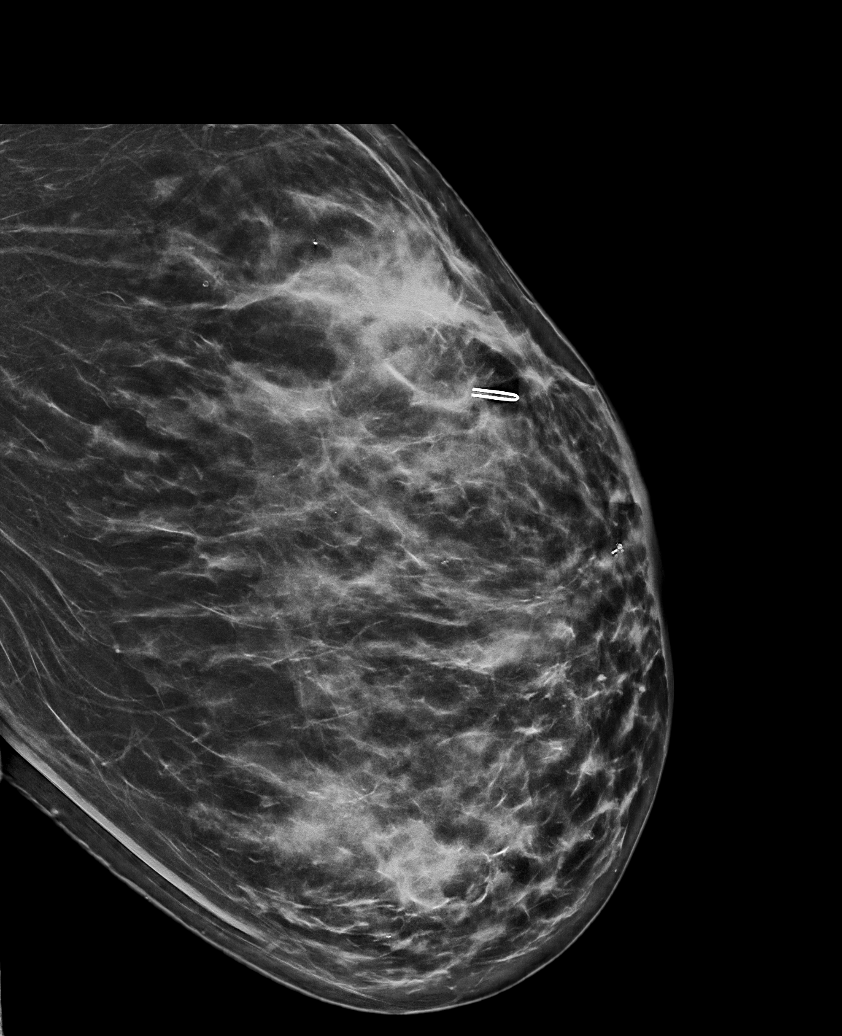

[L MLO synth-2D (2 of 2)]
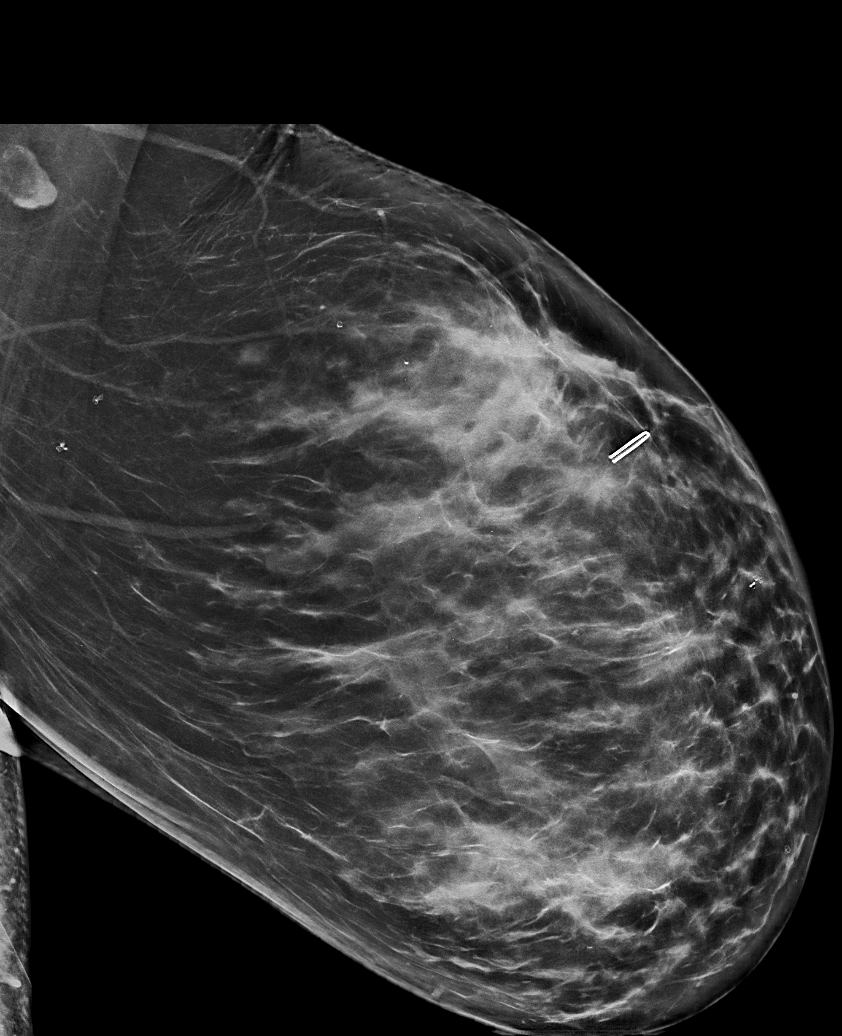

[R CC synth-2D]
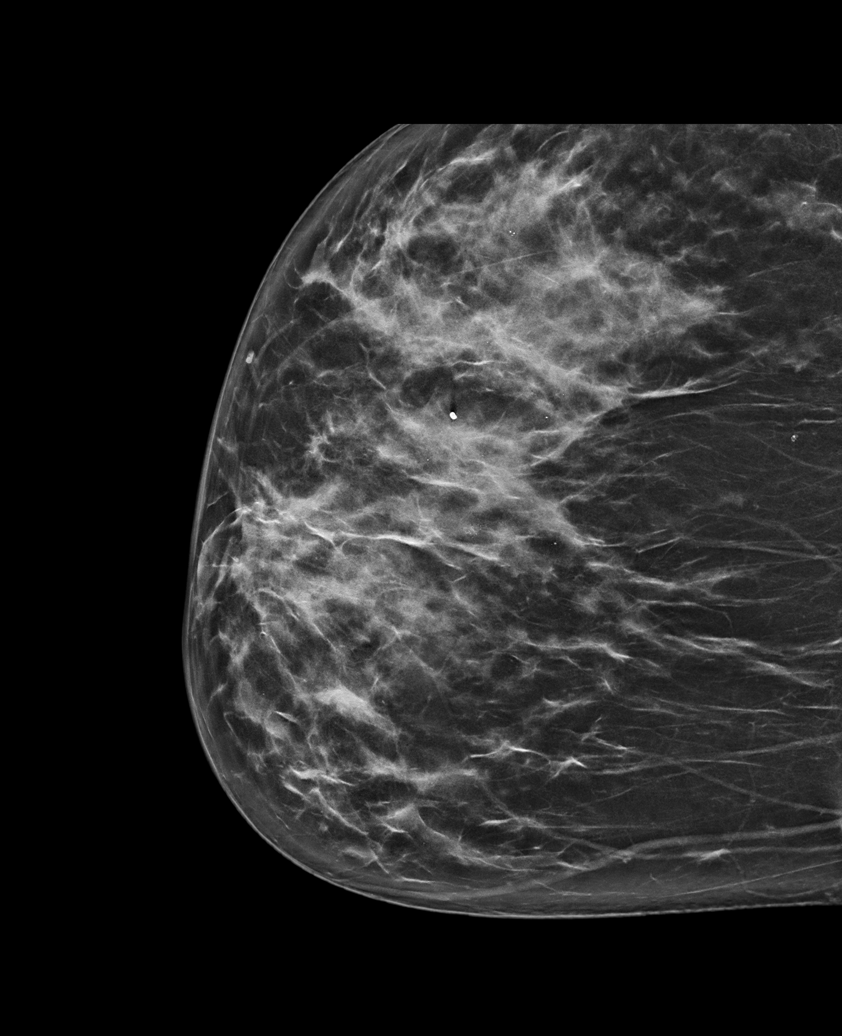

[L CC synth-2D]
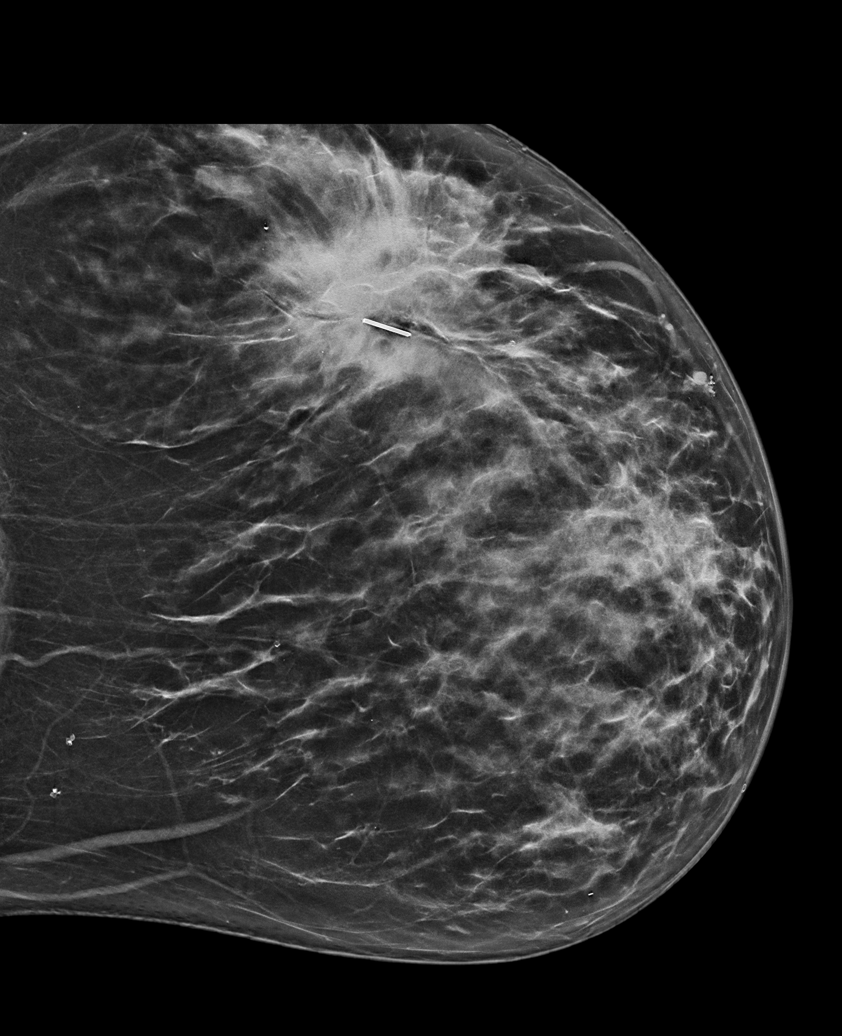

[R MLO synth-2D]
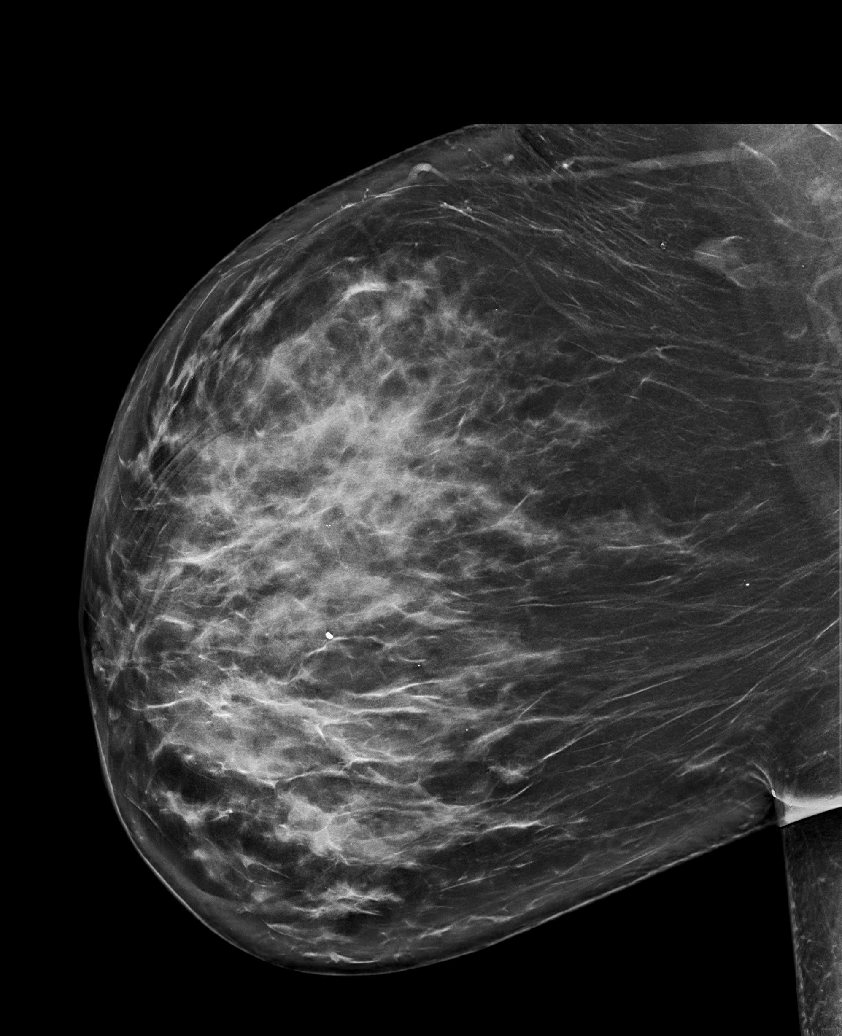

[L MLO tomo · tomo slice 43/84.0]
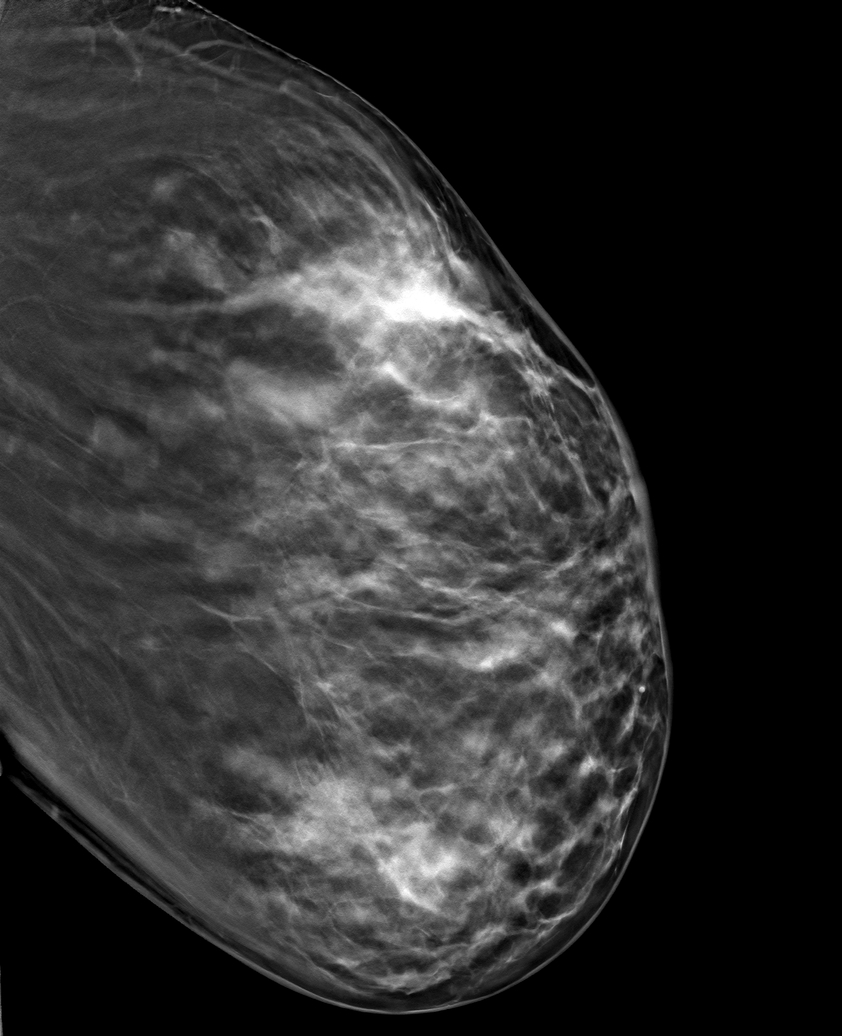

[6 of 30 positions shown; findings below may reference images not displayed]

ACR Breast Density Category c: The breast tissue is heterogeneously
dense, which may obscure small masses.
FINDINGS: Examination demonstrates expected post surgical changes over the
upper-outer quadrant of the left breast. Remainder of the left
breast as well as the right breast is unchanged.

Mammographic images were processed with CAD.
IMPRESSION: Expected post surgical changes over the upper-outer left breast.

RECOMMENDATION:
Recommend continued annual bilateral screening mammographic
follow-up.

I have discussed the findings and recommendations with the patient.
Results were also provided in writing at the conclusion of the
visit. If applicable, a reminder letter will be sent to the patient
regarding the next appointment.

BI-RADS CATEGORY  2: Benign.

## 2019-07-25 ENCOUNTER — Encounter: Payer: Self-pay | Admitting: Medical

## 2019-07-25 NOTE — Progress Notes (Signed)
59 y.o. G0P0 Married Other or two or more races female here for annual exam.  Doing well.  Was seen in ER in March.  Diagnosis was GERD.  Had side effects from medication she was given in ER.  She stopped these and didn't know which one caused it.  Denies vaginal bleeding.  She is having a little external itching.  Endocrinologist:  Arlis Porta, PA, at [facility].  Patient's last menstrual period was 02/21/2014.          Sexually active: No.  The current method of family planning is post menopausal status.    Exercising: Yes.    aerobics, stretching, weights Smoker:  no  Health Maintenance: Pap:  03-19-17 neg HPV HR neg History of abnormal Pap:  no MMG:  04-04-2019 bilateral & rt breast mmg category c density birads 2;neg Colonoscopy:  11-02-12 polyps, f/u not done.  Aware this is due.   BMD:   10-12-2018 TDaP:  2018 Pneumonia vaccine(s):  2019 Shingrix:  Discussed today Hep C testing: neg 2017 Screening Labs: done 04/2019   reports that she has never smoked. She has never used smokeless tobacco. She reports that she does not drink alcohol and does not use drugs.  Past Medical History:  Diagnosis Date  . Asthma    with respiratory infection  . Diabetes mellitus without complication ([place])    type II  . Fatty liver   . Fibroid uterus   . Hypertension   . Osteoarthritis 03/10/2015  . PONV (postoperative nausea and vomiting)    pt stopped breathing during colonoscopy  . Refusal of blood transfusions as patient is Jehovah's Witness    NO BLOOD PRODUCTS  . Sleep apnea     Past Surgical History:  Procedure Laterality Date  . BREAST CYST ASPIRATION    . BREAST EXCISIONAL BIOPSY    . CHOLECYSTECTOMY  1/11  . COLONOSCOPY    . DILATATION & CURRETTAGE/HYSTEROSCOPY WITH RESECTOCOPE N/A 03/13/2014   Procedure: DILATATION & CURETTAGE/HYSTEROSCOPY ;  Surgeon: Lyman Speller, MD;  Location: [facility];  Service: Gynecology;  Laterality: N/A;  . RADIOACTIVE SEED GUIDED  EXCISIONAL BREAST BIOPSY Left 12/30/2016   Procedure: RADIOACTIVE SEED GUIDED EXCISIONAL BREAST BIOPSY;  Surgeon: Stark Klein, MD;  Location: [place];  Service: General;  Laterality: Left;    Current Outpatient Medications  Medication Sig Dispense Refill  . albuterol (PROVENTIL HFA;VENTOLIN HFA) 108 (90 Base) MCG/ACT inhaler Inhale 1-2 puffs into the lungs every 4 (four) hours as needed for wheezing or shortness of breath. 18 g 2  . B-D ULTRA-FINE 33 LANCETS MISC Check BG 2 times/day    . glucose blood (ONE TOUCH ULTRA TEST) test strip Check BG 2 times/day.  Dx E11.65    . hydrochlorothiazide (HYDRODIURIL) 25 MG tablet TK 1 T PO D    . metFORMIN (GLUCOPHAGE) 500 MG tablet Take 1 tablet (500 mg total) by mouth 3 (three) times daily. 270 tablet 1  . Multiple Vitamin (MULTIVITAMIN) LIQD Take 5 mLs by mouth daily.    . Omega-3 Fatty Acids (OMEGA 3 PO) Take by mouth.    . TRADJENTA 5 MG TABS tablet Take 5 mg by mouth daily.    . clobetasol ointment (TEMOVATE) 4.27 % Apply 1 application topically 2 (two) times daily. 30 g 0   No current facility-administered medications for this visit.    Family History  Problem Relation Age of Onset  . Asthma Mother   . Diabetes Mother   .  Diabetes Father   . Heart attack Father   . Diabetes Brother   . Diabetes Brother   . Asthma Brother   . Heart Problems Sister   . Asthma Sister   . Hypertension Sister   . Heart disease Maternal Grandfather   . Osteopenia Sister     Review of Systems  Constitutional: Negative.   HENT: Negative.   Eyes: Negative.   Respiratory: Negative.   Cardiovascular: Negative.   Gastrointestinal: Negative.   Endocrine: Negative.   Genitourinary: Negative.   Musculoskeletal: Negative.   Skin: Negative.   Allergic/Immunologic: Negative.   Neurological: Negative.   Hematological: Negative.   Psychiatric/Behavioral: Negative.     Exam:   BP 120/68   Pulse 70   Temp 97.7 F (36.5 C) (Skin)    Resp 16   Ht 5\' 3"  (1.6 m)   Wt 180 lb (81.6 kg)   LMP 02/21/2014   BMI 31.89 kg/m   Height: 5\' 3"  (160 cm)  General appearance: alert, cooperative and appears stated age Head: Normocephalic, without obvious abnormality, atraumatic Neck: no adenopathy, supple, symmetrical, trachea midline and thyroid normal to inspection and palpation Lungs: clear to auscultation bilaterally Breasts: normal appearance, no masses or tenderness, nickel sized dark skin patch (pt reports this itches) noted at about 9 o'clock right breast Heart: regular rate and rhythm Abdomen: soft, non-tender; bowel sounds normal; no masses,  no organomegaly Extremities: extremities normal, atraumatic, no cyanosis or edema Skin: Skin color, texture, turgor normal. No rashes or lesions Lymph nodes: Cervical, supraclavicular, and axillary nodes normal. No abnormal inguinal nodes palpated Neurologic: Grossly normal   Pelvic: External genitalia:  no lesions, small sebaceous cysts noted              Urethra:  normal appearing urethra with no masses, tenderness or lesions              Bartholins and Skenes: normal                 Vagina: normal appearing vagina with normal color and discharge, no lesions              Cervix: no lesions              Pap taken: No. Bimanual Exam:  Uterus:  normal size, contour, position, consistency, mobility, non-tender              Adnexa: normal adnexa and no mass, fullness, tenderness               Rectovaginal: Confirms               Anus:  normal sphincter tone, no lesions  Chaperone, Terence Lux, CMA, was present for exam.  A:  Well Woman with normal exam PMP, no HRT Hypertension H/o fibroid uterus hysteroscopy with polyp resection 1/16 Ho atypical lobular hyperplasia, s/p lumpectomy 11/18 Jehovah's witness Type 2 DM  P:   Mammogram guidelines reviewed pap smear with neg HR HPV 2019.  Not indicated today. Colonoscopy is due.  She is aware.  Will call if needs help with  appt Lab work done in march and with endocrinology provider BMD UTD Vaccines reviewed  Clobetasol 0.05% ointment bid for up to 7 days to skin lesion.  Pt knows to call if doesn't resolve.  Would obtain skin biopsy at that time. Return annually or prn

## 2019-07-28 ENCOUNTER — Other Ambulatory Visit: Payer: Self-pay

## 2019-07-28 ENCOUNTER — Encounter: Payer: Self-pay | Admitting: Obstetrics & Gynecology

## 2019-07-28 ENCOUNTER — Other Ambulatory Visit: Payer: Self-pay | Admitting: Family Medicine

## 2019-07-28 ENCOUNTER — Ambulatory Visit (INDEPENDENT_AMBULATORY_CARE_PROVIDER_SITE_OTHER): Payer: 59 | Admitting: Obstetrics & Gynecology

## 2019-07-28 VITALS — BP 120/68 | HR 70 | Temp 97.7°F | Resp 16 | Ht 63.0 in | Wt 180.0 lb

## 2019-07-28 DIAGNOSIS — Z01419 Encounter for gynecological examination (general) (routine) without abnormal findings: Secondary | ICD-10-CM

## 2019-07-28 MED ORDER — CLOBETASOL PROPIONATE 0.05 % EX OINT: 1.0000 "application " | TOPICAL_OINTMENT | Freq: Two times a day (BID) | CUTANEOUS | 0 refills | Status: DC

## 2019-11-15 ENCOUNTER — Encounter: Payer: Self-pay | Admitting: *Deleted

## 2019-11-15 DIAGNOSIS — K5730 Diverticulosis of large intestine without perforation or abscess without bleeding: Secondary | ICD-10-CM | POA: Insufficient documentation

## 2019-11-15 LAB — HM COLONOSCOPY

## 2019-12-14 ENCOUNTER — Encounter: Payer: Self-pay | Admitting: Family Medicine

## 2019-12-14 NOTE — Telephone Encounter (Signed)
Chart updated

## 2020-04-09 ENCOUNTER — Other Ambulatory Visit: Payer: Self-pay | Admitting: Family Medicine

## 2020-04-09 ENCOUNTER — Ambulatory Visit (INDEPENDENT_AMBULATORY_CARE_PROVIDER_SITE_OTHER): Payer: 59 | Admitting: Family Medicine

## 2020-04-09 ENCOUNTER — Other Ambulatory Visit: Payer: Self-pay

## 2020-04-09 DIAGNOSIS — E119 Type 2 diabetes mellitus without complications: Secondary | ICD-10-CM | POA: Diagnosis not present

## 2020-04-09 DIAGNOSIS — N6310 Unspecified lump in the right breast, unspecified quadrant: Secondary | ICD-10-CM

## 2020-04-09 DIAGNOSIS — E785 Hyperlipidemia, unspecified: Secondary | ICD-10-CM

## 2020-04-09 DIAGNOSIS — E669 Obesity, unspecified: Secondary | ICD-10-CM | POA: Diagnosis not present

## 2020-04-09 DIAGNOSIS — N649 Disorder of breast, unspecified: Secondary | ICD-10-CM

## 2020-04-09 DIAGNOSIS — I10 Essential (primary) hypertension: Secondary | ICD-10-CM

## 2020-04-09 LAB — CBC
HCT: 36.5 % (ref 36.0–46.0)
Hemoglobin: 12 g/dL (ref 12.0–15.0)
MCHC: 32.8 g/dL (ref 30.0–36.0)
MCV: 90.6 fl (ref 78.0–100.0)
Platelets: 259 10*3/uL (ref 150.0–400.0)
RBC: 4.03 Mil/uL (ref 3.87–5.11)
RDW: 14.3 % (ref 11.5–15.5)
WBC: 7.6 10*3/uL (ref 4.0–10.5)

## 2020-04-09 LAB — COMPREHENSIVE METABOLIC PANEL
ALT: 27 U/L (ref 0–35)
AST: 22 U/L (ref 0–37)
Albumin: 4.1 g/dL (ref 3.5–5.2)
Alkaline Phosphatase: 129 U/L — ABNORMAL HIGH (ref 39–117)
BUN: 13 mg/dL (ref 6–23)
CO2: 32 mEq/L (ref 19–32)
Calcium: 10.5 mg/dL (ref 8.4–10.5)
Chloride: 98 mEq/L (ref 96–112)
Creatinine, Ser: 0.67 mg/dL (ref 0.40–1.20)
GFR: 94.66 mL/min (ref >60.00)
Glucose, Bld: 101 mg/dL — ABNORMAL HIGH (ref 70–99)
Potassium: 4.5 mEq/L (ref 3.5–5.1)
Sodium: 138 mEq/L (ref 135–145)
Total Bilirubin: 0.3 mg/dL (ref 0.2–1.2)
Total Protein: 7 g/dL (ref 6.0–8.3)

## 2020-04-09 LAB — LIPID PANEL
Cholesterol: 152 mg/dL (ref 0–200)
HDL: 61.5 mg/dL (ref >39.00)
LDL Cholesterol: 59 mg/dL (ref 0–99)
NonHDL: 90.46
Total CHOL/HDL Ratio: 2
Triglycerides: 158 mg/dL — ABNORMAL HIGH (ref 0.0–149.0)
VLDL: 31.6 mg/dL (ref 0.0–40.0)

## 2020-04-09 LAB — TSH: TSH: 3.34 u[IU]/mL (ref 0.35–4.50)

## 2020-04-09 LAB — HEMOGLOBIN A1C: Hgb A1c MFr Bld: 7.2 % — ABNORMAL HIGH (ref 4.6–6.5)

## 2020-04-09 LAB — VITAMIN D 25 HYDROXY (VIT D DEFICIENCY, FRACTURES): VITD: 34.99 ng/mL (ref 30.00–100.00)

## 2020-04-09 NOTE — Patient Instructions (Signed)
Hypercalcemia Hypercalcemia is when the level of calcium in a person's blood is above normal. The body needs calcium to make bones and keep them strong. Calcium also helps the muscles, nerves, brain, and heart work the way they should. Most of the calcium in the body is in the bones. There is also some calcium in the blood. Hypercalcemia can happen when calcium comes out of the bones, or when the kidneys are not able to remove calcium from the blood. Hypercalcemia can be mild or severe. What are the causes? There are many possible causes of hypercalcemia. Common causes of this condition include:  Hyperparathyroidism. This is a condition in which the body produces too much parathyroid hormone. There are four parathyroid glands in your neck. These glands produce a chemical messenger (hormone) that helps the body absorb calcium from foods and helps your bones release calcium.  Certain kinds of cancer. Less common causes of hypercalcemia include:  Getting too much calcium or vitamin D from your diet.  Kidney failure.  Hyperthyroidism.  Severe dehydration.  Being on bed rest or being inactive for a long time.  Certain medicines.  Infections. What increases the risk? You are more likely to develop this condition if you:  Are female.  Are 60 years of age or older.  Have a family history of hypercalcemia. What are the signs or symptoms? Mild hypercalcemia that starts slowly may not cause symptoms. Severe, sudden hypercalcemia is more likely to cause symptoms, such as:  Being more thirsty than usual.  Needing to urinate more often than usual.  Abdominal pain.  Nausea and vomiting.  Constipation.  Muscle pain, twitching, or weakness.  Feeling very tired. How is this diagnosed? Hypercalcemia is usually diagnosed with a blood test. You may also have tests to help determine what is causing this condition, such as imaging tests and more blood tests.   How is this  treated? Treatment for hypercalcemia depends on the cause. Treatment may include:  Receiving fluids through an IV.  Medicines that: ? Keep calcium levels steady after receiving fluids (loop diuretics). ? Keep calcium in your bones (bisphosphonates). ? Lower the calcium level in your blood.  Surgery to remove overactive parathyroid glands.  A procedure that filters your blood to correct calcium levels (hemodialysis). Follow these instructions at home:  Take over-the-counter and prescription medicines only as told by your health care provider.  Follow instructions from your health care provider about eating or drinking restrictions.  Drink enough fluid to keep your urine pale yellow.  Stay active. Weight-bearing exercise helps to keep calcium in your bones. Follow instructions from your health care provider about what type and level of exercise is safe for you.  Keep all follow-up visits as told by your health care provider. This is important.   Contact a health care provider if you have:  A fever.  A heartbeat that is irregular or very fast.  Changes in mood, memory, or personality. Get help right away if you:  Have severe abdominal pain.  Have chest pain.  Have trouble breathing.  Become very confused and sleepy.  Lose consciousness. Summary  Hypercalcemia is when the level of calcium in a person's blood is above normal. The body needs calcium to make bones and keep them strong. Calcium also helps the muscles, nerves, brain, and heart work the way they should.  There are many possible causes of hypercalcemia, and treatment depends on the cause.  Take over-the-counter and prescription medicines only as told by your   health care provider.  Follow instructions from your health care provider about eating or drinking restrictions. This information is not intended to replace advice given to you by your health care provider. Make sure you discuss any questions you have with  your health care provider. Document Revised: 03/01/2018 Document Reviewed: 11/08/2017 Elsevier Patient Education  2021 [employer]vier Inc.  

## 2020-04-10 ENCOUNTER — Ambulatory Visit
Admission: RE | Admit: 2020-04-10 | Discharge: 2020-04-10 | Disposition: A | Discharge status: Home / Self Care | Payer: 59 | Source: Ambulatory Visit | Attending: Family Medicine | Admitting: Family Medicine

## 2020-04-10 ENCOUNTER — Ambulatory Visit
Admission: RE | Admit: 2020-04-10 | Discharge: 2020-04-10 | Disposition: A | Discharge status: Home / Self Care | Payer: 59 | Source: Ambulatory Visit

## 2020-04-10 ENCOUNTER — Other Ambulatory Visit: Payer: Self-pay | Admitting: Family Medicine

## 2020-04-10 DIAGNOSIS — N6310 Unspecified lump in the right breast, unspecified quadrant: Secondary | ICD-10-CM

## 2020-04-10 DIAGNOSIS — N649 Disorder of breast, unspecified: Secondary | ICD-10-CM | POA: Insufficient documentation

## 2020-04-10 LAB — PARATHYROID HORMONE, INTACT (NO CA): PTH: 59 pg/mL (ref 14–64)

## 2020-04-10 NOTE — Assessment & Plan Note (Signed)
Mild and improved on recheck. Asymptomatic, PTH normal. No changes presently

## 2020-04-10 NOTE — Assessment & Plan Note (Signed)
Well controlled, no changes to meds. Encouraged heart healthy diet such as the DASH diet and exercise as tolerated.  °

## 2020-04-10 NOTE — Assessment & Plan Note (Signed)
hgba1c acceptable, minimize simple carbs. Increase exercise as tolerated. Continue current meds. Follows with endocrinology but would like to move her care from [facility] to [facility] so referral is placed

## 2020-04-10 NOTE — Assessment & Plan Note (Signed)
Encouraged heart healthy diet, increase exercise, avoid trans fats, consider a krill oil cap daily. Tolerating Atorvastatin 

## 2020-04-10 NOTE — Progress Notes (Signed)
S.    Subjective:    Patient ID: Yolanda [name], female    DOB: [birth year], 60 y.o.   MRN: [id number]  Chief Complaint  Patient presents with  . right breast nodule    Pt states that it is not painful but it pulsates.    HPI Patient is in today for follow up on chronic medical concerns. No recent febrile illness or hospitalizations. She is concerned about a lesion in her right breast that she first noted about 2 weeks ago. It is mildly tender to palpation and she notes a sense of throbbing at times in the right breast. No other concerning symptoms. Denies CP/palp/SOB/HA/congestion/fevers/GI or GU c/o. Taking meds as prescribed  Past Medical History:  Diagnosis Date  . Asthma    with respiratory infection  . Diabetes mellitus without complication ([place])    type II  . Fatty liver   . Fibroid uterus   . Hypertension   . Osteoarthritis 03/10/2015  . PONV (postoperative nausea and vomiting)    pt stopped breathing during colonoscopy  . Refusal of blood transfusions as patient is Jehovah's Witness    NO BLOOD PRODUCTS  . Sleep apnea     Past Surgical History:  Procedure Laterality Date  . BREAST CYST ASPIRATION    . BREAST EXCISIONAL BIOPSY    . CHOLECYSTECTOMY  1/11  . COLONOSCOPY    . DILATATION & CURRETTAGE/HYSTEROSCOPY WITH RESECTOCOPE N/A 03/13/2014   Procedure: DILATATION & CURETTAGE/HYSTEROSCOPY ;  Surgeon: Lyman Speller, MD;  Location: [facility];  Service: Gynecology;  Laterality: N/A;  . RADIOACTIVE SEED GUIDED EXCISIONAL BREAST BIOPSY Left 12/30/2016   Procedure: RADIOACTIVE SEED GUIDED EXCISIONAL BREAST BIOPSY;  Surgeon: Stark Klein, MD;  Location: [facility];  Service: General;  Laterality: Left;    Family History  Problem Relation Age of Onset  . Asthma Mother   . Diabetes Mother   . Diabetes Father   . Heart attack Father   . Diabetes Brother   . Diabetes Brother   . Asthma Brother   . Heart Problems Sister   . Asthma Sister   .  Hypertension Sister   . Heart disease Maternal Grandfather   . Osteopenia Sister     Social History   Socioeconomic History  . Marital status: Married    Spouse name: Not on file  . Number of children: Not on file  . Years of education: Not on file  . Highest education level: Not on file  Occupational History  . Not on file  Tobacco Use  . Smoking status: Never Smoker  . Smokeless tobacco: Never Used  Vaping Use  . Vaping Use: Never used  Substance and Sexual Activity  . Alcohol use: No  . Drug use: No  . Sexual activity: Not Currently    Partners: Male    Birth control/protection: Post-menopausal  Other Topics Concern  . Not on file  Social History Narrative  . Not on file   Social Determinants of Health   Financial Resource Strain: Not on file  Food Insecurity: Not on file  Transportation Needs: Not on file  Physical Activity: Not on file  Stress: Not on file  Social Connections: Not on file  Intimate Partner Violence: Not on file    Outpatient Medications Prior to Visit  Medication Sig Dispense Refill  . albuterol (PROVENTIL HFA;VENTOLIN HFA) 108 (90 Base) MCG/ACT inhaler Inhale 1-2 puffs into the lungs every 4 (four) hours as needed for wheezing or shortness  of breath. 18 g 2  . atorvastatin (LIPITOR) 10 MG tablet Take 10 mg by mouth daily.    . B-D ULTRA-FINE 33 LANCETS MISC Check BG 2 times/day    . glucose blood test strip Check BG 2 times/day.  Dx E11.65    . hydrochlorothiazide (HYDRODIURIL) 25 MG tablet TK 1 T PO D    . metFORMIN (GLUCOPHAGE) 500 MG tablet TAKE 1 TABLET(500 MG) BY MOUTH THREE TIMES DAILY 270 tablet 0  . Omega-3 Fatty Acids (OMEGA 3 PO) Take by mouth.    . clobetasol ointment (TEMOVATE) 9.62 % Apply 1 application topically 2 (two) times daily. 30 g 0  . Multiple Vitamin (MULTIVITAMIN) LIQD Take 5 mLs by mouth daily.    . TRADJENTA 5 MG TABS tablet Take 5 mg by mouth daily.     No facility-administered medications prior to visit.     Allergies  Allergen Reactions  . Lisinopril Cough    With SOB and wheezing    Review of Systems  Constitutional: Negative for fever and malaise/fatigue.  HENT: Negative for congestion.   Eyes: Negative for blurred vision.  Respiratory: Negative for shortness of breath.   Cardiovascular: Negative for chest pain, palpitations and leg swelling.  Gastrointestinal: Negative for abdominal pain, blood in stool and nausea.  Genitourinary: Negative for dysuria and frequency.  Musculoskeletal: Negative for falls.  Skin: Negative for rash.  Neurological: Negative for dizziness, loss of consciousness and headaches.  Endo/Heme/Allergies: Negative for environmental allergies.  Psychiatric/Behavioral: Negative for depression. The patient is not nervous/anxious.        Objective:    Physical Exam Vitals and nursing note reviewed.  Constitutional:      General: She is not in acute distress.    Appearance: She is well-developed and well-nourished.  HENT:     Head: Normocephalic and atraumatic.     Nose: Nose normal.  Eyes:     General:        Right eye: No discharge.        Left eye: No discharge.  Cardiovascular:     Rate and Rhythm: Normal rate and regular rhythm.     Heart sounds: No murmur heard.   Pulmonary:     Effort: Pulmonary effort is normal.     Breath sounds: Normal breath sounds.  Abdominal:     General: Bowel sounds are normal.     Palpations: Abdomen is soft.     Tenderness: There is no abdominal tenderness.  Genitourinary:    Comments: 1 cm, mobile, Nontender mass in right breast at 12 oclock. No skin changes or discharge.  Musculoskeletal:        General: No edema.     Cervical back: Normal range of motion and neck supple.  Skin:    General: Skin is warm and dry.  Neurological:     Mental Status: She is alert and oriented to person, place, and time.  Psychiatric:        Mood and Affect: Mood and affect normal.     LMP 02/21/2014  Wt Readings from  Last 3 Encounters:  07/28/19 180 lb (81.6 kg)  05/15/19 180 lb (81.6 kg)  02/02/19 188 lb 9.6 oz (85.5 kg)    Diabetic Foot Exam - Simple   No data filed    Lab Results  Component Value Date   WBC 7.6 04/09/2020   HGB 12.0 04/09/2020   HCT 36.5 04/09/2020   PLT 259.0 04/09/2020   GLUCOSE 101 (H) 04/09/2020  CHOL 152 04/09/2020   TRIG 158.0 (H) 04/09/2020   HDL 61.50 04/09/2020   LDLDIRECT 128.9 07/14/2006   LDLCALC 59 04/09/2020   ALT 27 04/09/2020   AST 22 04/09/2020   NA 138 04/09/2020   K 4.5 04/09/2020   CL 98 04/09/2020   CREATININE 0.67 04/09/2020   BUN 13 04/09/2020   CO2 32 04/09/2020   TSH 3.34 04/09/2020   HGBA1C 7.2 (H) 04/09/2020   MICROALBUR 26 11/04/2017    Lab Results  Component Value Date   TSH 3.34 04/09/2020   Lab Results  Component Value Date   WBC 7.6 04/09/2020   HGB 12.0 04/09/2020   HCT 36.5 04/09/2020   MCV 90.6 04/09/2020   PLT 259.0 04/09/2020   Lab Results  Component Value Date   NA 138 04/09/2020   K 4.5 04/09/2020   CO2 32 04/09/2020   GLUCOSE 101 (H) 04/09/2020   BUN 13 04/09/2020   CREATININE 0.67 04/09/2020   BILITOT 0.3 04/09/2020   ALKPHOS 129 (H) 04/09/2020   AST 22 04/09/2020   ALT 27 04/09/2020   PROT 7.0 04/09/2020   ALBUMIN 4.1 04/09/2020   CALCIUM 10.5 04/09/2020   ANIONGAP 11 05/15/2019   GFR 94.66 04/09/2020   Lab Results  Component Value Date   CHOL 152 04/09/2020   Lab Results  Component Value Date   HDL 61.50 04/09/2020   Lab Results  Component Value Date   LDLCALC 59 04/09/2020   Lab Results  Component Value Date   TRIG 158.0 (H) 04/09/2020   Lab Results  Component Value Date   CHOLHDL 2 04/09/2020   Lab Results  Component Value Date   HGBA1C 7.2 (H) 04/09/2020       Assessment & Plan:   Problem List Items Addressed This Visit    OBESITY   Essential hypertension - Primary    Well controlled, no changes to meds. Encouraged heart healthy diet such as the DASH diet and exercise  as tolerated.       Relevant Medications   atorvastatin (LIPITOR) 10 MG tablet   Other Relevant Orders   CBC (Completed)   Comprehensive metabolic panel (Completed)   TSH (Completed)   Diabetes (HCC)    hgba1c acceptable, minimize simple carbs. Increase exercise as tolerated. Continue current meds. Follows with endocrinology but would like to move her care from [facility] to [facility] so referral is placed      Relevant Medications   atorvastatin (LIPITOR) 10 MG tablet   Other Relevant Orders   Ambulatory referral to Endocrinology   Hemoglobin A1c (Completed)   Hyperlipidemia    Encouraged heart healthy diet, increase exercise, avoid trans fats, consider a krill oil cap daily. Tolerating Atorvastatin      Relevant Medications   atorvastatin (LIPITOR) 10 MG tablet   Other Relevant Orders   Lipid panel (Completed)   Hypercalcemia    Mild and improved on recheck. Asymptomatic, PTH normal. No changes presently      Relevant Orders   Comprehensive metabolic panel (Completed)   VITAMIN D 25 Hydroxy (Vit-D Deficiency, Fractures) (Completed)   PTH, intact (no Ca) (Completed)   Breast lesion    Patient noted a lesion in her right breast about 2 weeks ago. It has been unchanged since she noted it. No discharge but she notes that the right breast throbs at times. She has had trouble with the left breast with throbbing and concerns in the past. She is set up for a bilateral MGM and  right sided ultrasound.          I have discontinued Rosanna Randy. Dzik's multivitamin, Tradjenta, and clobetasol ointment. I am also having her maintain her glucose blood, B-D ULTRA-FINE 33 LANCETS, albuterol, hydrochlorothiazide, Omega-3 Fatty Acids (OMEGA 3 PO), metFORMIN, and atorvastatin.  No orders of the defined types were placed in this encounter.    Penni Homans, MD

## 2020-04-10 NOTE — Assessment & Plan Note (Signed)
Patient noted a lesion in her right breast about 2 weeks ago. It has been unchanged since she noted it. No discharge but she notes that the right breast throbs at times. She has had trouble with the left breast with throbbing and concerns in the past. She is set up for a bilateral MGM and right sided ultrasound.

## 2020-04-11 ENCOUNTER — Other Ambulatory Visit: Payer: 59

## 2020-04-12 ENCOUNTER — Other Ambulatory Visit: Payer: Self-pay | Admitting: Oncology

## 2020-04-16 ENCOUNTER — Other Ambulatory Visit: Payer: Self-pay | Admitting: General Surgery

## 2020-04-16 ENCOUNTER — Other Ambulatory Visit: Payer: Self-pay

## 2020-04-16 DIAGNOSIS — Z170 Estrogen receptor positive status [ER+]: Secondary | ICD-10-CM

## 2020-04-16 DIAGNOSIS — C50211 Malignant neoplasm of upper-inner quadrant of right female breast: Secondary | ICD-10-CM

## 2020-04-16 DIAGNOSIS — C50919 Malignant neoplasm of unspecified site of unspecified female breast: Secondary | ICD-10-CM

## 2020-04-17 NOTE — Progress Notes (Signed)
[place]  Telephone:([phone] Fax:(336) [phone]     ID: SANJUANA MRUK DOB: [birth year]  MR#: [id number]  JYN#:[id number]  Patient Care Team: Mosie Lukes, MD as PCP - General (Family Medicine) Michel Bickers, MD as Consulting Physician (Infectious Diseases) Megan Salon, MD as Consulting Physician (Gynecology) Callin Ashe, Virgie Dad, MD as Consulting Physician (Oncology) Juanita Craver, MD as Consulting Physician (Gastroenterology) Stark Klein, MD as Consulting Physician (General Surgery) Chauncey Cruel, MD OTHER MD:  CHIEF COMPLAINT: Estrogen receptor positive breast cancer  CURRENT TREATMENT: Awaiting definitive surgery   HISTORY OF CURRENT ILLNESS: I saw Ms. Duval in December 2018 for evaluation of atypical lobular hyperplasia.  We discussed various strategies regarding breast cancer prevention at that time for her to consider.    More recently (around the time of the Super Bowl 2022) she found a lump in her right breast.  She underwent bilateral diagnostic mammography with tomography and right breast ultrasonography at [facility] on 04/10/2020 showing: breast density category C; palpable 3 cm mass in right breast at 12 o'clock; no enlarged adenopathy in right axilla.  Accordingly on the same day, she proceeded to biopsy of the right breast area in question. The pathology from this procedure ([id number]) showed: invasive ductal carcinoma, grade 3; ductal carcinoma in situ. Prognostic indicators significant for: estrogen receptor, 90% positive with moderate staining intensity and progesterone receptor, 0% negative. Proliferation marker Ki67 at 25%. HER2 equivocal by immunohistochemistry (2+), but negative by fluorescent in situ hybridization with a signals ratio 1.49 and number per cell 2.75.  Cancer Staging Malignant neoplasm of upper-outer quadrant of right breast in female, estrogen receptor positive ([place]) Staging form: Breast, AJCC 8th  Edition - Clinical: Stage IIA (cT2, cN0, cM0, G3, ER+, PR+, HER2-) - Signed by Chauncey Cruel, MD on 04/18/2020  The patient's subsequent history is as detailed below.   INTERVAL HISTORY: Jelena was evaluated in the breast cancer clinic on 04/18/2020 accompanied by her husband [name]. Her case was also presented at the multidisciplinary breast cancer conference on 04/17/2020. At that time a preliminary plan was proposed: Lumpectomy and sentinel lymph node sampling, Oncotype to be obtained from the original biopsy, likely needing chemotherapy, adjuvant radiation, antiestrogens   REVIEW OF SYSTEMS: The patient denies unusual headaches, visual changes, nausea, vomiting, stiff neck, dizziness, or gait imbalance. There has been no cough, phlegm production, or pleurisy, no chest pain or pressure, and no change in bowel or bladder habits. The patient denies fever, rash, bleeding, unexplained fatigue or unexplained weight loss.  She does not exercise regularly although she does do some stretching most days and has an elliptical at home.  A detailed review of systems was otherwise entirely negative.   COVID 19 VACCINATION STATUS: fully vaccinated [employer]), with booster 12/2019   PAST MEDICAL HISTORY: Past Medical History:  Diagnosis Date  . Asthma    with respiratory infection  . Diabetes mellitus without complication ([place])    type II  . Fatty liver   . Fibroid uterus   . Hypertension   . Osteoarthritis 03/10/2015  . PONV (postoperative nausea and vomiting)    pt stopped breathing during colonoscopy  . Refusal of blood transfusions as patient is Jehovah's Witness    NO BLOOD PRODUCTS  . Sleep apnea     PAST SURGICAL HISTORY: Past Surgical History:  Procedure Laterality Date  . BREAST CYST ASPIRATION    . BREAST EXCISIONAL BIOPSY    . CHOLECYSTECTOMY  1/11  . COLONOSCOPY    .  DILATATION & CURRETTAGE/HYSTEROSCOPY WITH RESECTOCOPE N/A 03/13/2014   Procedure: DILATATION &  CURETTAGE/HYSTEROSCOPY ;  Surgeon: Lyman Speller, MD;  Location: [facility];  Service: Gynecology;  Laterality: N/A;  . RADIOACTIVE SEED GUIDED EXCISIONAL BREAST BIOPSY Left 12/30/2016   Procedure: RADIOACTIVE SEED GUIDED EXCISIONAL BREAST BIOPSY;  Surgeon: Stark Klein, MD;  Location: [facility];  Service: General;  Laterality: Left;    FAMILY HISTORY: Family History  Problem Relation Age of Onset  . Asthma Mother   . Diabetes Mother   . Diabetes Father   . Heart attack Father   . Diabetes Brother   . Diabetes Brother   . Asthma Brother   . Heart Problems Sister   . Asthma Sister   . Hypertension Sister   . Heart disease Maternal Grandfather   . Osteopenia Sister   The patient's father died from heart disease at the age of 92.  The patient's mother is 47 years old as of March 2022.  The patient has 4 brothers and 2 sisters.  She is not aware of any history of cancer in her family.   GYNECOLOGIC HISTORY:  Patient's last menstrual period was 02/21/2014. Menarche: 60 years old GX P 0 LMP age 8 Contraceptive 1 year, no complications HRT no  Hysterectomy? no BSO? 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  Her husband [name] works as a Marketing executive for [employer].  He has 2 children from a prior marriage, both living in [place].  One is traveling nurse.  The other 1 is a Administrator.  The patient is a Restaurant manager, fast food.    ADVANCED DIRECTIVES: In the absence of any documentation to the contrary, the patient's spouse is their HCPOA.  The patient made it clear at her visit 04/18/2020 that she would not accept any blood transfusion or blood products for any reason including to stave off death   HEALTH MAINTENANCE: Social History   Tobacco Use  . Smoking status: Never Smoker  . Smokeless tobacco: Never Used  Vaping Use  . Vaping Use: Never used  Substance Use Topics  . Alcohol use: No  . Drug use: No     Colonoscopy:  10/2019 (Dr. Collene Mares), recall 2026  PAP: 03/2017, negative  Bone density: 09/2018, -0.8   Allergies  Allergen Reactions  . Lisinopril Cough    With SOB and wheezing    Current Outpatient Medications  Medication Sig Dispense Refill  . albuterol (PROVENTIL HFA;VENTOLIN HFA) 108 (90 Base) MCG/ACT inhaler Inhale 1-2 puffs into the lungs every 4 (four) hours as needed for wheezing or shortness of breath. 18 g 2  . atorvastatin (LIPITOR) 10 MG tablet Take 10 mg by mouth daily.    . B-D ULTRA-FINE 33 LANCETS MISC Check BG 2 times/day    . glucose blood test strip Check BG 2 times/day.  Dx E11.65    . hydrochlorothiazide (HYDRODIURIL) 25 MG tablet TK 1 T PO D    . metFORMIN (GLUCOPHAGE) 500 MG tablet TAKE 1 TABLET(500 MG) BY MOUTH THREE TIMES DAILY 270 tablet 0  . Omega-3 Fatty Acids (OMEGA 3 PO) Take by mouth.     No current facility-administered medications for this visit.    OBJECTIVE: African-American woman in no acute distress  Vitals:   04/18/20 1603  BP: (!) 159/75  Pulse: 78  Resp: 18  Temp: 97.7 F (36.5 C)  SpO2: 98%     Body mass index is 32.95 kg/m.  Wt Readings from Last 3 Encounters:  04/18/20 186 lb (84.4 kg)  07/28/19 180 lb (81.6 kg)  05/15/19 180 lb (81.6 kg)      ECOG FS:1 - Symptomatic but completely ambulatory  Ocular: Sclerae unicteric, pupils round and equal Ear-nose-throat: Wearing a mask Lymphatic: No cervical or supraclavicular adenopathy Lungs no rales or rhonchi Heart regular rate and rhythm Abd soft, nontender, positive bowel sounds MSK no focal spinal tenderness, no joint edema Neuro: non-focal, well-oriented, appropriate affect Breasts: The mass in the upper outer quadrant is easily palpable, movable, measures approximately an inch and a half and does not invade the skin and does not affect the nipple.  The left breast and both axillae are benign.   LAB RESULTS:  CMP     Component Value Date/Time   NA 134 (L) 04/18/2020 1527   NA 138  11/04/2017 0000   K 4.1 04/18/2020 1527   CL 98 04/18/2020 1527   CO2 28 04/18/2020 1527   GLUCOSE 116 (H) 04/18/2020 1527   BUN 8 04/18/2020 1527   BUN 14 11/04/2017 0000   CREATININE 0.81 04/18/2020 1527   CREATININE 0.60 09/07/2014 0950   CALCIUM 10.3 04/18/2020 1527   PROT 7.9 04/18/2020 1527   ALBUMIN 3.9 04/18/2020 1527   AST 26 04/18/2020 1527   ALT 37 04/18/2020 1527   ALKPHOS 131 (H) 04/18/2020 1527   BILITOT 0.3 04/18/2020 1527   GFRNONAA >60 04/18/2020 1527   GFRAA >60 05/15/2019 1803    No results found for: TOTALPROTELP, ALBUMINELP, A1GS, A2GS, BETS, BETA2SER, GAMS, MSPIKE, SPEI  Lab Results  Component Value Date   WBC 7.9 04/18/2020   NEUTROABS 3.5 04/18/2020   HGB 12.8 04/18/2020   HCT 40.6 04/18/2020   MCV 93.1 04/18/2020   PLT 288 04/18/2020    No results found for: LABCA2  No components found for: [id number]  No results for input(s): INR in the last 168 hours.  No results found for: LABCA2  No results found for: YQM578  No results found for: ION629  No results found for: BMW413  No results found for: CA2729  No components found for: HGQUANT  No results found for: CEA1 / No results found for: CEA1   No results found for: AFPTUMOR  No results found for: CHROMOGRNA  No results found for: KPAFRELGTCHN, LAMBDASER, KAPLAMBRATIO (kappa/lambda light chains)  No results found for: HGBA, HGBA2QUANT, HGBFQUANT, HGBSQUAN (Hemoglobinopathy evaluation)   No results found for: LDH  Lab Results  Component Value Date   IRON 38 (L) 10/16/2009   IRONPCTSAT 10.2 (L) 10/16/2009   (Iron and TIBC)  Lab Results  Component Value Date   FERRITIN 8.9 (L) 10/16/2009    Urinalysis    Component Value Date/Time   COLORURINE YELLOW 03/11/2009 1100   APPEARANCEUR CLEAR 03/11/2009 1100   LABSPEC 1.017 03/11/2009 1100   PHURINE 7.0 03/11/2009 1100   GLUCOSEU NEGATIVE 03/11/2009 1100   HGBUR LARGE (A) 03/11/2009 1100   BILIRUBINUR N 05/21/2015 1614    KETONESUR NEGATIVE 03/11/2009 1100   PROTEINUR N 05/21/2015 1614   PROTEINUR NEGATIVE 03/11/2009 1100   UROBILINOGEN negative 05/21/2015 1614   UROBILINOGEN 0.2 03/11/2009 1100   NITRITE N 05/21/2015 1614   NITRITE NEGATIVE 03/11/2009 1100   LEUKOCYTESUR Negative 05/21/2015 1614     STUDIES: US BREAST LTD UNI RIGHT INC AXILLA  Result Date: 04/10/2020 CLINICAL DATA:  Patient complains of a palpable abnormality in the 12 o'clock region of the right breast. EXAM: DIGITAL DIAGNOSTIC BILATERAL MAMMOGRAM  WITH TOMOSYNTHESIS AND CAD; ULTRASOUND RIGHT BREAST LIMITED TECHNIQUE: Bilateral digital diagnostic mammography and breast tomosynthesis was performed. The images were evaluated with computer-aided detection.; Targeted ultrasound examination of the right breast was performed COMPARISON:  Previous exam(s). ACR Breast Density Category c: The breast tissue is heterogeneously dense, which may obscure small masses. FINDINGS: There is a developing asymmetry in the 12 o'clock region of the right breast. Excisional biopsy changes are seen in the upper-outer quadrant of the left breast. No malignant type microcalcifications identified in either breast. On physical exam, I palpate a discrete mass in the right breast at 12 o'clock 6 cm from the nipple. Targeted ultrasound is performed, showing an irregular hypoechoic mass in the right breast at 12 o'clock 6 cm from the nipple measuring 2.6 x 1.2 x 3.0 cm. Sonographic evaluation the right axilla does not show any enlarged adenopathy. IMPRESSION: Suspicious mass in the 12 o'clock region of the right breast. RECOMMENDATION: Ultrasound-guided core biopsy of the mass in the 12 o'clock region of the right breast is recommended. I have discussed the findings and recommendations with the patient. If applicable, a reminder letter will be sent to the patient regarding the next appointment. BI-RADS CATEGORY  5: Highly suggestive of malignancy. Electronically Signed   By: Lillia Mountain M.D.   On: 04/10/2020 10:54   MM DIAG BREAST TOMO BILATERAL  Result Date: 04/10/2020 CLINICAL DATA:  Patient complains of a palpable abnormality in the 12 o'clock region of the right breast. EXAM: DIGITAL DIAGNOSTIC BILATERAL MAMMOGRAM WITH TOMOSYNTHESIS AND CAD; ULTRASOUND RIGHT BREAST LIMITED TECHNIQUE: Bilateral digital diagnostic mammography and breast tomosynthesis was performed. The images were evaluated with computer-aided detection.; Targeted ultrasound examination of the right breast was performed COMPARISON:  Previous exam(s). ACR Breast Density Category c: The breast tissue is heterogeneously dense, which may obscure small masses. FINDINGS: There is a developing asymmetry in the 12 o'clock region of the right breast. Excisional biopsy changes are seen in the upper-outer quadrant of the left breast. No malignant type microcalcifications identified in either breast. On physical exam, I palpate a discrete mass in the right breast at 12 o'clock 6 cm from the nipple. Targeted ultrasound is performed, showing an irregular hypoechoic mass in the right breast at 12 o'clock 6 cm from the nipple measuring 2.6 x 1.2 x 3.0 cm. Sonographic evaluation the right axilla does not show any enlarged adenopathy. IMPRESSION: Suspicious mass in the 12 o'clock region of the right breast. RECOMMENDATION: Ultrasound-guided core biopsy of the mass in the 12 o'clock region of the right breast is recommended. I have discussed the findings and recommendations with the patient. If applicable, a reminder letter will be sent to the patient regarding the next appointment. BI-RADS CATEGORY  5: Highly suggestive of malignancy. Electronically Signed   By: Lillia Mountain M.D.   On: 04/10/2020 10:54   MM CLIP PLACEMENT RIGHT  Result Date: 04/10/2020 CLINICAL DATA:  Confirmation of clip placement after ultrasound-guided core needle biopsy of a highly suspicious 3.0 cm mass involving the UPPER RIGHT breast. EXAM: 2D AND TOMOSYNTHESIS  DIAGNOSTIC RIGHT MAMMOGRAM POST ULTRASOUND BIOPSY COMPARISON:  Previous exam(s). FINDINGS: Tomosynthesis and synthesized full field CC and mediolateral images were obtained following ultrasound guided biopsy of a mass involving the UPPER RIGHT breast. The ribbon shaped tissue marking clip is appropriately positioned within the biopsied mass. Expected post biopsy changes are present without evidence of hematoma. IMPRESSION: Appropriate positioning of the ribbon shaped biopsy marking clip within biopsied mass in the UPPER RIGHT  breast. The mass is predominantly obscured by the patient's adjacent dense fibroglandular tissue. Final Assessment: Post Procedure Mammograms for Marker Placement Electronically Signed   By: Evangeline Dakin M.D.   On: 04/10/2020 12:06   [place] RT BREAST BX W LOC DEV 1ST LESION IMG BX SPEC US GUIDE  Addendum Date: 04/11/2020   ADDENDUM REPORT: 04/11/2020 15:35 ADDENDUM: In the Clinical Data, I stated that the patient had a personal history of malignant LEFT breast lumpectomy in November, 2018. She actually had a high risk excisional biopsy of atypical lobular hyperplasia (lobular neoplasia) in September, 2018. Electronically Signed   By: Evangeline Dakin M.D.   On: 04/11/2020 15:35   Addendum Date: 04/11/2020   ADDENDUM REPORT: 04/11/2020 14:36 ADDENDUM: Pathology revealed GRADE III INVASIVE DUCTAL CARCINOMA, DUCTAL CARCINOMA IN SITU of the upper RIGHT breast, 12 o'clock 6cmfn. This was found to be concordant by Dr. Peggye Fothergill. Pathology results were discussed with the patient by telephone. The patient reported doing well after the biopsy with tenderness at the site. Post biopsy instructions and care were reviewed and questions were answered. The patient was encouraged to call [facility] for any additional concerns. Surgical consultation has been arranged with Dr. Stark Klein at [facility] on April 16, 2020. Pathology results reported by Stacie Acres RN on 04/11/2020. Electronically Signed   By: Evangeline Dakin M.D.   On: 04/11/2020 14:36   Result Date: 04/11/2020 CLINICAL DATA:  60 year old presenting with a palpable highly suspicious 3.0 cm mass in the UPPER RIGHT breast at the 12 o'clock position approximately 6 cm from the nipple. Personal history of malignant left breast lumpectomy in November, 2018. EXAM: ULTRASOUND GUIDED RIGHT BREAST CORE NEEDLE BIOPSY COMPARISON:  Previous exam(s). PROCEDURE: I met with the patient and we discussed the procedure of ultrasound-guided biopsy, including benefits and alternatives. We discussed the high likelihood of a successful procedure. We discussed the risks of the procedure, including infection, bleeding, tissue injury, clip migration, and inadequate sampling. Informed written consent was given. The usual time-out protocol was performed immediately prior to the procedure. Lesion quadrant: UPPER breast, 12 o'clock location. Using sterile technique with chlorhexidine as skin antisepsis, 1% lidocaine and 1% lidocaine with epinephrine as local anesthetic, under direct ultrasound visualization, a 12 gauge Bard Marquee core needle device placed through an 11 gauge introducer needle was used to perform biopsy of the mass in the UPPER RIGHT breast using a lateral approach. At the conclusion of the procedure a ribbon shaped tissue marker clip was deployed into the biopsy cavity. Follow up 2 view mammogram was performed and dictated separately. IMPRESSION: Ultrasound guided biopsy of a highly suspicious 3.0 cm mass involving the UPPER RIGHT breast. No apparent complications. Electronically Signed: By: Evangeline Dakin M.D. On: 04/10/2020 12:05     ELIGIBLE FOR AVAILABLE RESEARCH PROTOCOL: no  ASSESSMENT: 60 y.o. [place] woman status post right breast upper outer quadrant biopsy 04/10/2020 for a clinical T2N0, stage IIA invasive ductal carcinoma, grade 3, estrogen receptor positive, progesterone receptor and  HER-2 negative, with an MIB-1 of 25%.  (1) Oncotype to be obtained from the original biopsy  (2) definitive surgery pending  (3) adjuvant chemotherapy if appropriate  (4) adjuvant radiation  (5) antiestrogens.  PLAN: I met today with Anja to review her new diagnosis. Specifically we discussed the biology of her breast cancer, its diagnosis, staging, treatment  options and prognosis. We first reviewed the fact that cancer is not one disease but more than  100 different diseases and that it is important to keep them separate-- otherwise when friends and relatives discuss their own cancer experiences with La[place]le confusion can result. Similarly we explained that if breast cancer spreads to the bone or liver, the patient would not have bone cancer or liver cancer, but breast cancer in the bone and breast cancer in the liver: one cancer in three places-- not 3 different cancers which otherwise would have to be treated in 3 different ways.  We discussed the difference between local and systemic therapy. In terms of loco-regional treatment, lumpectomy plus radiation is equivalent to mastectomy as far as survival is concerned. For this reason, and because the cosmetic results are generally superior, we recommend breast conserving surgery.   We then discussed the rationale for systemic therapy. There is some risk that this cancer may have already spread to other parts of her body. Patients frequently ask at this point about bone scans, CAT scans and PET scans to find out if they have occult breast cancer somewhere else. The problem is that in early stage disease we are much more likely to find false positives then true cancers and this would expose the patient to unnecessary procedures as well as unnecessary radiation. Scans cannot answer the question the patient really would like to know, which is whether she has microscopic disease elsewhere in her body. For those reasons we do not recommend them.  Of course  we would proceed to aggressive evaluation of any symptoms that might suggest metastatic disease, but that is not the case here.  Next we went over the options for systemic therapy which are anti-estrogens, anti-HER-2 immunotherapy, and chemotherapy. Kennetta does not meet criteria for anti-HER-2 immunotherapy. She is a good candidate for anti-estrogens.  The question of chemotherapy is more complicated. Chemotherapy is most effective in aggressive tumors like Addalyn 's.  Her breast cancer also is also only moderately estrogen receptor positive and is progesterone receptor negative.  Given that profile I think she will benefit from chemotherapy but we are going to request an Oncotype from the original biopsy, as suggested by NCCN guidelines.  The patient understands that I expect her to benefit significantly from chemotherapy and accordingly we discussed that today.  Specifically we went over the "gold standard" which is doxorubicin and cyclophosphamide in dose dense fashion x4 followed by weekly paclitaxel x12.  We discussed the possible toxicities side effects and complications of these agents.  She will need an echocardiogram, a port, and she will meet with our chemotherapy teaching nurse.  Since it takes approximately 2 weeks to get the Oncotype results I would think she will be having surgery towards the end of March and we can then start chemotherapy around April 19.  Of course if the Oncotype turns out to be low risk she will proceed directly to radiation  Kaveri has a good understanding of the overall plan. She agrees with it. She knows the goal of treatment in her case is cure. She will call with any problems that may develop before her next visit here.  Total encounter time 65 minutes.Sarajane Jews C. Boyde Grieco, MD 04/18/2020 5:33 PM Medical Oncology and Hematology [facility] [place], [place] [address] Tel. [phone]    Fax. [phone]   This document serves as a  record of services personally performed by Lurline Del, MD. It was created on his behalf by Wilburn Mylar, a trained medical scribe. The creation of this record is based  on the scribe's personal observations and the provider's statements to them.   I, Lurline Del MD, have reviewed the above documentation for accuracy and completeness, and I agree with the above.    *Total Encounter Time as defined by the Centers for Medicare and Medicaid Services includes, in addition to the face-to-face time of a patient visit (documented in the note above) non-face-to-face time: obtaining and reviewing outside history, ordering and reviewing medications, tests or procedures, care coordination (communications with other health care professionals or caregivers) and documentation in the medical record.

## 2020-04-18 ENCOUNTER — Other Ambulatory Visit: Payer: Self-pay

## 2020-04-18 ENCOUNTER — Inpatient Hospital Stay: Payer: 59

## 2020-04-18 ENCOUNTER — Inpatient Hospital Stay: Payer: 59 | Attending: Oncology | Admitting: Oncology

## 2020-04-18 DIAGNOSIS — C50411 Malignant neoplasm of upper-outer quadrant of right female breast: Secondary | ICD-10-CM

## 2020-04-18 DIAGNOSIS — C50919 Malignant neoplasm of unspecified site of unspecified female breast: Secondary | ICD-10-CM

## 2020-04-18 DIAGNOSIS — Z170 Estrogen receptor positive status [ER+]: Secondary | ICD-10-CM

## 2020-04-18 LAB — CMP (CANCER CENTER ONLY)
ALT: 37 U/L (ref 0–44)
AST: 26 U/L (ref 15–41)
Albumin: 3.9 g/dL (ref 3.5–5.0)
Alkaline Phosphatase: 131 U/L — ABNORMAL HIGH (ref 38–126)
Anion gap: 8 (ref 5–15)
BUN: 8 mg/dL (ref 6–20)
CO2: 28 mmol/L (ref 22–32)
Calcium: 10.3 mg/dL (ref 8.9–10.3)
Chloride: 98 mmol/L (ref 98–111)
Creatinine: 0.81 mg/dL (ref 0.44–1.00)
GFR, Estimated: >60 mL/min (ref >60)
Glucose, Bld: 116 mg/dL — ABNORMAL HIGH (ref 70–99)
Potassium: 4.1 mmol/L (ref 3.5–5.1)
Sodium: 134 mmol/L — ABNORMAL LOW (ref 135–145)
Total Bilirubin: 0.3 mg/dL (ref 0.3–1.2)
Total Protein: 7.9 g/dL (ref 6.5–8.1)

## 2020-04-18 LAB — CBC WITH DIFFERENTIAL (CANCER CENTER ONLY)
Abs Immature Granulocytes: 0.01 10*3/uL (ref 0.00–0.07)
Basophils Absolute: 0.1 10*3/uL (ref 0.0–0.1)
Basophils Relative: 1 %
Eosinophils Absolute: 0.4 10*3/uL (ref 0.0–0.5)
Eosinophils Relative: 5 %
HCT: 40.6 % (ref 36.0–46.0)
Hemoglobin: 12.8 g/dL (ref 12.0–15.0)
Immature Granulocytes: 0 %
Lymphocytes Relative: 41 %
Lymphs Abs: 3.3 10*3/uL (ref 0.7–4.0)
MCH: 29.4 pg (ref 26.0–34.0)
MCHC: 31.5 g/dL (ref 30.0–36.0)
MCV: 93.1 fL (ref 80.0–100.0)
Monocytes Absolute: 0.6 10*3/uL (ref 0.1–1.0)
Monocytes Relative: 8 %
Neutro Abs: 3.5 10*3/uL (ref 1.7–7.7)
Neutrophils Relative %: 45 %
Platelet Count: 288 10*3/uL (ref 150–400)
RBC: 4.36 MIL/uL (ref 3.87–5.11)
RDW: 13.4 % (ref 11.5–15.5)
WBC Count: 7.9 10*3/uL (ref 4.0–10.5)
nRBC: 0 % (ref 0.0–0.2)

## 2020-04-19 ENCOUNTER — Telehealth: Payer: Self-pay | Admitting: *Deleted

## 2020-04-19 ENCOUNTER — Encounter: Payer: Self-pay | Admitting: *Deleted

## 2020-04-19 NOTE — Telephone Encounter (Signed)
Left vm regarding navigation resources and contact information. Request return call with questions or needs.

## 2020-04-24 ENCOUNTER — Encounter: Payer: Self-pay | Admitting: Radiation Oncology

## 2020-04-24 ENCOUNTER — Ambulatory Visit
Admission: RE | Admit: 2020-04-24 | Discharge: 2020-04-24 | Disposition: A | Discharge status: Home / Self Care | Payer: 59 | Source: Ambulatory Visit | Attending: Radiation Oncology | Admitting: Radiation Oncology

## 2020-04-24 VITALS — Ht 63.0 in | Wt 186.0 lb

## 2020-04-24 DIAGNOSIS — C50411 Malignant neoplasm of upper-outer quadrant of right female breast: Secondary | ICD-10-CM

## 2020-04-24 DIAGNOSIS — Z170 Estrogen receptor positive status [ER+]: Secondary | ICD-10-CM

## 2020-04-24 NOTE — Progress Notes (Signed)
New Breast Cancer Diagnosis: Right Breast- UOQ  Did patient present with symptoms (if so, please note symptoms) or screening mammography?:Palpable mass    Location and Extent of disease :right breast. Located at 12 o'clock position, measured  3 cm in greatest dimension. Adenopathy no.  Histology per Pathology Report: grade 3, Invasive Ductal Carcinoma, DCIS 04/10/2020  Receptor Status: ER(positive 90%), PR (negative 0%), Her2-neu (negative), Ki-(25%)  Surgeon and surgical plan, if any: Dr. Barry Dienes Right Breast Lumpectomy with SLN biopsy 05/21/2020  Medical oncologist, treatment if any:   Dr. Jana Hakim 04/18/2020 -Her case was also presented at the multidisciplinary breast cancer conference on 04/17/2020. At that time a preliminary plan was proposed: Lumpectomy and sentinel lymph node sampling, Oncotype to be obtained from the original biopsy, likely needing chemotherapy, adjuvant radiation, antiestrogens. -Sharmon does not meet criteria for anti-HER-2 immunotherapy. She is a good candidate for anti-estrogens. -Her breast cancer is also only moderately estrogen receptor positive and is progesterone receptor negative.  Given that profile I think she will benefit from chemotherapy but we are going to request an Oncotype from the original biopsy, as suggested by NCCN guidelines.  -Since it takes approximately 2 weeks to get the Oncotype results I would think she will be having surgery towards the end of March and we can then start chemotherapy around April 19. -Of course if the Oncotype turns out to be low risk she will proceed directly to radiation   Family History of Breast/Ovarian/Prostate Cancer: No  Lymphedema issues, if any: None  Pain issues, if any:  No  SAFETY ISSUES: Prior radiation? No Pacemaker/ICD? No Possible current pregnancy? Postmenopausal Is the patient on methotrexate? No  Current Complaints / other details:

## 2020-04-24 NOTE — Progress Notes (Signed)
Radiation Oncology         [phone] ________________________________  Name: Yolanda [name]        MRN: [id number]  Date of Service: 04/24/2020 DOB: 1960-08-08  GY:FVCBS, Bonnita Levan, MD  Stark Klein, MD     REFERRING PHYSICIAN: Stark Klein, MD   DIAGNOSIS: The encounter diagnosis was Malignant neoplasm of upper-outer quadrant of right breast in female, estrogen receptor positive ([place]).   HISTORY OF PRESENT ILLNESS: Yolanda [name] is a 60 y.o. female with a new diagnosis of right breast cancer.  The patient has a history of ALH and underwent excision of the left breast for this in 2018.  She recently noted a palpable right breast mass however and diagnostic imaging measured the lesion in the 12 o'clock position up to 3 cm in total dimension.  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  Her cancer was ER positive, PR negative,HER-2 was negative by FISH and Ki-67 was 25%.  She is seen today on my chart to discuss treatment recommendations of her cancer.  In conference, her case has been discussed and it was recommended that she lumpectomy with sentinel lymph node biopsy, Oncotype scoring, adjuvant radiotherapy and adjuvant antiestrogen treatment.   PREVIOUS RADIATION THERAPY: No   PAST MEDICAL HISTORY:  Past Medical History:  Diagnosis Date  . Asthma    with respiratory infection  . Diabetes mellitus without complication ([place])    type II  . Fatty liver   . Fibroid uterus   . Hypertension   . Osteoarthritis 03/10/2015  . PONV (postoperative nausea and vomiting)    pt stopped breathing during colonoscopy  . Refusal of blood transfusions as patient is Jehovah's Witness    NO BLOOD PRODUCTS  . Sleep apnea        PAST SURGICAL HISTORY: Past Surgical History:  Procedure Laterality Date  . BREAST CYST ASPIRATION    . BREAST EXCISIONAL BIOPSY    . CHOLECYSTECTOMY  1/11   . COLONOSCOPY    . DILATATION & CURRETTAGE/HYSTEROSCOPY WITH RESECTOCOPE N/A 03/13/2014   Procedure: DILATATION & CURETTAGE/HYSTEROSCOPY ;  Surgeon: Lyman Speller, MD;  Location: [facility];  Service: Gynecology;  Laterality: N/A;  . RADIOACTIVE SEED GUIDED EXCISIONAL BREAST BIOPSY Left 12/30/2016   Procedure: RADIOACTIVE SEED GUIDED EXCISIONAL BREAST BIOPSY;  Surgeon: Stark Klein, MD;  Location: [facility];  Service: General;  Laterality: Left;     FAMILY HISTORY:  Family History  Problem Relation Age of Onset  . Asthma Mother   . Diabetes Mother   . Diabetes Father   . Heart attack Father   . Diabetes Brother   . Diabetes Brother   . Asthma Brother   . Heart Problems Sister   . Asthma Sister   . Hypertension Sister   . Heart disease Maternal Grandfather   . Osteopenia Sister      SOCIAL HISTORY:  reports that she has never smoked. She has never used smokeless tobacco. She reports that she does not drink alcohol and does not use drugs. The patient is married and lives in [place]. She is retired from working in [employer]. She is originally from [place].   ALLERGIES: Lisinopril   MEDICATIONS:  Current Outpatient Medications  Medication Sig Dispense Refill  . albuterol (PROVENTIL HFA;VENTOLIN HFA) 108 (90 Base) MCG/ACT inhaler Inhale 1-2 puffs into the lungs every 4 (four) hours  as needed for wheezing or shortness of breath. 18 g 2  . atorvastatin (LIPITOR) 10 MG tablet Take 10 mg by mouth daily.    . B-D ULTRA-FINE 33 LANCETS MISC Check BG 2 times/day    . glucose blood test strip Check BG 2 times/day.  Dx E11.65    . hydrochlorothiazide (HYDRODIURIL) 25 MG tablet TK 1 T PO D    . metFORMIN (GLUCOPHAGE) 500 MG tablet TAKE 1 TABLET(500 MG) BY MOUTH THREE TIMES DAILY 270 tablet 0  . Omega-3 Fatty Acids (OMEGA 3 PO) Take by mouth.     No current facility-administered medications for this encounter.     REVIEW OF SYSTEMS: On review of  systems, the patient reports that she is doing well without concerns for pain in her breast.     PHYSICAL EXAM:  Unable to assess vitals.  In general this is a well appearing female in no acute distress. She's alert and oriented x4 and appropriate throughout the examination. Cardiopulmonary assessment is negative for acute distress and she exhibits normal effort. Bilateral breast exam is deferred.    ECOG = 0  0 - Asymptomatic (Fully active, able to carry on all predisease activities without restriction)  1 - Symptomatic but completely ambulatory (Restricted in physically strenuous activity but ambulatory and able to carry out work of a light or sedentary nature. For example, light housework, office work)  2 - Symptomatic, <50% in bed during the day (Ambulatory and capable of all self care but unable to carry out any work activities. Up and about more than 50% of waking hours)  3 - Symptomatic, >50% in bed, but not bedbound (Capable of only limited self-care, confined to bed or chair 50% or more of waking hours)  4 - Bedbound (Completely disabled. Cannot carry on any self-care. Totally confined to bed or chair)  5 - Death   Eustace Pen MM, Creech RH, Tormey DC, et al. [phone]). "Toxicity and response criteria of the [facility]". [place] Oncol. 5 (6): 649-55    LABORATORY DATA:  Lab Results  Component Value Date   WBC 7.9 04/18/2020   HGB 12.8 04/18/2020   HCT 40.6 04/18/2020   MCV 93.1 04/18/2020   PLT 288 04/18/2020   Lab Results  Component Value Date   NA 134 (L) 04/18/2020   K 4.1 04/18/2020   CL 98 04/18/2020   CO2 28 04/18/2020   Lab Results  Component Value Date   ALT 37 04/18/2020   AST 26 04/18/2020   ALKPHOS 131 (H) 04/18/2020   BILITOT 0.3 04/18/2020      RADIOGRAPHY: US BREAST LTD UNI RIGHT INC AXILLA  Result Date: 04/10/2020 CLINICAL DATA:  Patient complains of a palpable abnormality in the 12 o'clock region of the right breast.  EXAM: DIGITAL DIAGNOSTIC BILATERAL MAMMOGRAM WITH TOMOSYNTHESIS AND CAD; ULTRASOUND RIGHT BREAST LIMITED TECHNIQUE: Bilateral digital diagnostic mammography and breast tomosynthesis was performed. The images were evaluated with computer-aided detection.; Targeted ultrasound examination of the right breast was performed COMPARISON:  Previous exam(s). ACR Breast Density Category c: The breast tissue is heterogeneously dense, which may obscure small masses. FINDINGS: There is a developing asymmetry in the 12 o'clock region of the right breast. Excisional biopsy changes are seen in the upper-outer quadrant of the left breast. No malignant type microcalcifications identified in either breast. On physical exam, I palpate a discrete mass in the right breast at 12 o'clock 6 cm from the nipple. Targeted ultrasound is performed, showing  an irregular hypoechoic mass in the right breast at 12 o'clock 6 cm from the nipple measuring 2.6 x 1.2 x 3.0 cm. Sonographic evaluation the right axilla does not show any enlarged adenopathy. IMPRESSION: Suspicious mass in the 12 o'clock region of the right breast. RECOMMENDATION: Ultrasound-guided core biopsy of the mass in the 12 o'clock region of the right breast is recommended. I have discussed the findings and recommendations with the patient. If applicable, a reminder letter will be sent to the patient regarding the next appointment. BI-RADS CATEGORY  5: Highly suggestive of malignancy. Electronically Signed   By: Lillia Mountain M.D.   On: 04/10/2020 10:54   MM DIAG BREAST TOMO BILATERAL  Result Date: 04/10/2020 CLINICAL DATA:  Patient complains of a palpable abnormality in the 12 o'clock region of the right breast. EXAM: DIGITAL DIAGNOSTIC BILATERAL MAMMOGRAM WITH TOMOSYNTHESIS AND CAD; ULTRASOUND RIGHT BREAST LIMITED TECHNIQUE: Bilateral digital diagnostic mammography and breast tomosynthesis was performed. The images were evaluated with computer-aided detection.; Targeted ultrasound  examination of the right breast was performed COMPARISON:  Previous exam(s). ACR Breast Density Category c: The breast tissue is heterogeneously dense, which may obscure small masses. FINDINGS: There is a developing asymmetry in the 12 o'clock region of the right breast. Excisional biopsy changes are seen in the upper-outer quadrant of the left breast. No malignant type microcalcifications identified in either breast. On physical exam, I palpate a discrete mass in the right breast at 12 o'clock 6 cm from the nipple. Targeted ultrasound is performed, showing an irregular hypoechoic mass in the right breast at 12 o'clock 6 cm from the nipple measuring 2.6 x 1.2 x 3.0 cm. Sonographic evaluation the right axilla does not show any enlarged adenopathy. IMPRESSION: Suspicious mass in the 12 o'clock region of the right breast. RECOMMENDATION: Ultrasound-guided core biopsy of the mass in the 12 o'clock region of the right breast is recommended. I have discussed the findings and recommendations with the patient. If applicable, a reminder letter will be sent to the patient regarding the next appointment. BI-RADS CATEGORY  5: Highly suggestive of malignancy. Electronically Signed   By: Lillia Mountain M.D.   On: 04/10/2020 10:54   MM CLIP PLACEMENT RIGHT  Result Date: 04/10/2020 CLINICAL DATA:  Confirmation of clip placement after ultrasound-guided core needle biopsy of a highly suspicious 3.0 cm mass involving the UPPER RIGHT breast. EXAM: 2D AND TOMOSYNTHESIS DIAGNOSTIC RIGHT MAMMOGRAM POST ULTRASOUND BIOPSY COMPARISON:  Previous exam(s). FINDINGS: Tomosynthesis and synthesized full field CC and mediolateral images were obtained following ultrasound guided biopsy of a mass involving the UPPER RIGHT breast. The ribbon shaped tissue marking clip is appropriately positioned within the biopsied mass. Expected post biopsy changes are present without evidence of hematoma. IMPRESSION: Appropriate positioning of the ribbon shaped  biopsy marking clip within biopsied mass in the UPPER RIGHT breast. The mass is predominantly obscured by the patient's adjacent dense fibroglandular tissue. Final Assessment: Post Procedure Mammograms for Marker Placement Electronically Signed   By: Evangeline Dakin M.D.   On: 04/10/2020 12:06   [place] RT BREAST BX W LOC DEV 1ST LESION IMG BX SPEC US GUIDE  Addendum Date: 04/11/2020   ADDENDUM REPORT: 04/11/2020 15:35 ADDENDUM: In the Clinical Data, I stated that the patient had a personal history of malignant LEFT breast lumpectomy in November, 2018. She actually had a high risk excisional biopsy of atypical lobular hyperplasia (lobular neoplasia) in September, 2018. Electronically Signed   By: Evangeline Dakin M.D.   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 This was found to be concordant by Dr. Peggye Fothergill. Pathology results were discussed with the patient by telephone. The patient reported doing well after the biopsy with tenderness at the site. Post biopsy instructions and care were reviewed and questions were answered. The patient was encouraged to call [facility] for any additional concerns. Surgical consultation has been arranged with Dr. Stark Klein at [facility] on April 16, 2020. Pathology results reported by Stacie Acres RN on 04/11/2020. Electronically Signed   By: Evangeline Dakin M.D.   On: 04/11/2020 14:36   Result Date: 04/11/2020 CLINICAL DATA:  60 year old presenting with a palpable highly suspicious 3.0 cm mass in the UPPER RIGHT breast at the 12 o'clock position approximately 6 cm from the nipple. Personal history of malignant left breast lumpectomy in November, 2018. EXAM: ULTRASOUND GUIDED RIGHT BREAST CORE NEEDLE BIOPSY COMPARISON:  Previous exam(s). PROCEDURE: I met  with the patient and we discussed the procedure of ultrasound-guided biopsy, including benefits and alternatives. We discussed the high likelihood of a successful procedure. We discussed the risks of the procedure, including infection, bleeding, tissue injury, clip migration, and inadequate sampling. Informed written consent was given. The usual time-out protocol was performed immediately prior to the procedure. Lesion quadrant: UPPER breast, 12 o'clock location. Using sterile technique with chlorhexidine as skin antisepsis, 1% lidocaine and 1% lidocaine with epinephrine as local anesthetic, under direct ultrasound visualization, a 12 gauge Bard Marquee core needle device placed through an 11 gauge introducer needle was used to perform biopsy of the mass in the UPPER RIGHT breast using a lateral approach. At the conclusion of the procedure a ribbon shaped tissue marker clip was deployed into the biopsy cavity. Follow up 2 view mammogram was performed and dictated separately. IMPRESSION: Ultrasound guided biopsy of a highly suspicious 3.0 cm mass involving the UPPER RIGHT breast. No apparent complications. Electronically Signed: By: Evangeline Dakin M.D. On: 04/10/2020 12:05       IMPRESSION/PLAN: 1. Stage IIA, cT2N0M0, grade 3, ER/PR positive invasive ductal carcinoma of the right breast. Dr. Lisbeth Renshaw discusses the pathology findings and reviews the nature of right breast disease. The consensus from the breast conference includes breast conservation with lumpectomy with  sentinel node biopsy. Dr. Jana Hakim plans to order Oncotype Dx score to determine a role for systemic therapy. Provided that chemotherapy is not indicated, the patient's course would then be followed by external radiotherapy to the breast  to reduce risks of local recurrence followed by antiestrogen therapy. We discussed the risks, benefits, short, and long term effects of radiotherapy, as well as the curative intent, and the patient is interested  in proceeding. Dr. Lisbeth Renshaw discusses the delivery and logistics of radiotherapy and anticipates a course of 4 or 6 1/2 weeks of radiotherapy to the right breast. We will see her back a few weeks after surgery to discuss the simulation process and anticipate we starting radiotherapy about 4-6 weeks after surgery.    This encounter was provided by telemedicine platform MyChart.  The patient has provided two factor identification and has given verbal consent for this type of encounter and has been advised to only accept a meeting of this type in a secure network environment. The time spent during this encounter was 60 minutes including preparation, discussion, and coordination of the patient's care.  The attendants for this meeting include Blenda Nicely, RN, Dr. Lisbeth Renshaw, Hayden Pedro  and Revonda Humphrey and her husband Yolanda [name]. During the encounter,  Blenda Nicely, RN, Dr. Lisbeth Renshaw, and Hayden Pedro were located at [facility].  Yolanda [name] was located at home with her husband Yolanda [name].    The above documentation reflects my direct findings during this shared patient visit. Please see the separate note by Dr. Lisbeth Renshaw on this date for the remainder of the patient's plan of care.    Carola Rhine, [facility]    **Disclaimer: This note was dictated with voice recognition software. Similar sounding words can inadvertently be transcribed and this note may contain transcription errors which may not have been corrected upon publication of note.**

## 2020-04-25 ENCOUNTER — Encounter: Payer: Self-pay | Admitting: Licensed Clinical Social Worker

## 2020-04-25 NOTE — Progress Notes (Signed)
[place] Work  Initial Assessment   Yolanda [name] is a 60 y.o. year old female contacted by phone. Clinical Social Work was referred by new patient protocol for assessment of psychosocial needs.   SDOH (Social Determinants of Health) assessments performed: Yes SDOH Interventions   Flowsheet Row Most Recent Value  SDOH Interventions   Food Insecurity Interventions Intervention Not Indicated  Financial Strain Interventions Intervention Not Indicated  Housing Interventions Intervention Not Indicated  Social Connections Interventions Intervention Not Indicated  Transportation Interventions Intervention Not Indicated      Distress Screen completed: No No flowsheet data found.    Family/Social Information:  . Housing Arrangement: patient lives with husband . Family members/support persons in your life? Family, Friends and faith community (including sister-in-law and a couple of friends who have also been through breast cancer) . Transportation concerns: no  . Employment: Retired. Income source: husband's employment . Financial concerns: No o Type of concern: None . Food access concerns: no . Religious or spiritual practice: yes, her faith is helping her and she has strong support around this . Services Currently in place:  N/a   Coping/ Adjustment to diagnosis: . Patient understands treatment plan and what happens next? yes, waiting on oncotype results. Is nervous about chemotherapy. Handling everything by trying to stay calm, not get ahead of herself with things she can't control, talking to friends, and relying on her spiritual faith and trust in the medical team . Patient reported stressors: Adjusting to my illness . Current coping skills/ strengths: Capable of independent living, Communication skills, Motivation for treatment/growth, Religious Affiliation and Supportive family/friends    SUMMARY: Current SDOH Barriers:  . No significant SDOH barriers noted  today  Clinical Social Work Clinical Goal(s):  Marland Kitchen No clinical SW goals at this time  Interventions: . Discussed the importance of support during treatment . Informed patient of the support team roles and support services at [facility] . Provided CSW contact information and encouraged patient to call with any questions or concerns  Follow Up Plan: Patient will contact CSW with any support or resource needs Patient verbalizes understanding of plan: Yes    Christeen Douglas , LCSW

## 2020-04-30 ENCOUNTER — Other Ambulatory Visit: Payer: Self-pay

## 2020-04-30 ENCOUNTER — Inpatient Hospital Stay: Payer: 59

## 2020-05-01 ENCOUNTER — Encounter: Payer: Self-pay | Admitting: *Deleted

## 2020-05-02 NOTE — Progress Notes (Signed)
[facility]  [phone] [phone]     ID: IVER FEHRENBACH DOB: 1960/12/01  MR#: [id number]  CWL#:[id number]  Patient Care Team: Bradd Canary, MD as PCP - General (Family Medicine) Cliffton Asters, MD as Consulting Physician (Infectious Diseases) Jerene Bears, MD as Consulting Physician (Gynecology) Andros Channing, Valentino Hue, MD as Consulting Physician (Oncology) Charna Elizabeth, MD as Consulting Physician (Gastroenterology) Almond Lint, MD as Consulting Physician (General Surgery) Pershing Proud, RN as Oncology Nurse Navigator Donnelly Angelica, RN as Oncology Nurse Navigator Kari Baars OTHER MD:  I connected with Mittie Bodo on 05/02/20 at  9:00 AM EDT by video enabled telemedicine visit and verified that I am speaking with the correct person using two identifiers.   I discussed the limitations, risks, security and privacy concerns of performing an evaluation and management service by telemedicine and the availability of in-person appointments. I also discussed with the patient that there may be a patient responsible charge related to this service. The patient expressed understanding and agreed to proceed.   Other persons participating in the visit and their role in the encounter: Patient's husband  Patient's location: Home Provider's location: [facility]  Total time spent: 20 minutes   CHIEF COMPLAINT: Estrogen receptor positive breast cancer  CURRENT TREATMENT: Awaiting definitive surgery   INTERVAL HISTORY: Yolanda [name] was contacted today for follow up of her estrogen receptor positive breast cancer. She was evaluated in the breast cancer clinic on 04/18/2020.  An Oncotype DX test was requested on the core biopsy sample. The results are pending.  We did discuss how the Oncotype works and what the Oncotype score means and she understands that if she has a score of 26 or greater she will need chemotherapy.  She has met  with our chemotherapy teaching nurse in preparation for that.  She also met with the radiation group and if the Oncotype score is low she will go straight to radiation  She is scheduled for right lumpectomy on 05/21/2020 under Dr. Donell Beers.   REVIEW OF SYSTEMS: Anberlyn tells me she exercises 30 minutes every day.  She is not on a special diet at this point.  She was concerned that she is on a variety of supplements that are not listed on her chart and she wanted to make sure that I had that information.  Aside from these issues a detailed review of systems today was stable   COVID 19 VACCINATION STATUS: fully vaccinated [employer]), with booster 12/2019   HISTORY OF CURRENT ILLNESS: From the original intake note:   I saw [name] in December 2018 for evaluation of atypical lobular hyperplasia.  We discussed various strategies regarding breast cancer prevention at that time for her to consider.    More recently (around the time of the Super Bowl 2022) she found a lump in her right breast.  She underwent bilateral diagnostic mammography with tomography and right breast ultrasonography at [facility] on 04/10/2020 showing: breast density category C; palpable 3 cm mass in right breast at 12 o'clock; no enlarged adenopathy in right axilla.  Accordingly on the same day, she proceeded to biopsy of the right breast area in question. The pathology from this procedure ([id number]) showed: invasive ductal carcinoma, grade 3; ductal carcinoma in situ. Prognostic indicators significant for: estrogen receptor, 90% positive with moderate staining intensity and progesterone receptor, 0% negative. Proliferation marker Ki67 at 25%. HER2 equivocal by immunohistochemistry (2+), but negative by fluorescent in situ hybridization  with a signals ratio 1.49 and number per cell 2.75.  Cancer Staging Malignant neoplasm of upper-outer quadrant of right breast in female, estrogen receptor positive ([place]) Staging form:  Breast, AJCC 8th Edition - Clinical: Stage IIA (cT2, cN0, cM0, G3, ER+, PR+, HER2-) - Signed by Chauncey Cruel, MD on 04/18/2020  The patient's subsequent history is as detailed below.   PAST MEDICAL HISTORY: Past Medical History:  Diagnosis Date  . Asthma    with respiratory infection  . Diabetes mellitus without complication ([place])    type II  . Fatty liver   . Fibroid uterus   . Hypertension   . Osteoarthritis 03/10/2015  . PONV (postoperative nausea and vomiting)    pt stopped breathing during colonoscopy  . Refusal of blood transfusions as patient is Jehovah's Witness    NO BLOOD PRODUCTS  . Sleep apnea     PAST SURGICAL HISTORY: Past Surgical History:  Procedure Laterality Date  . BREAST CYST ASPIRATION    . BREAST EXCISIONAL BIOPSY    . CHOLECYSTECTOMY  1/11  . COLONOSCOPY    . DILATATION & CURRETTAGE/HYSTEROSCOPY WITH RESECTOCOPE N/A 03/13/2014   Procedure: DILATATION & CURETTAGE/HYSTEROSCOPY ;  Surgeon: Lyman Speller, MD;  Location: [facility];  Service: Gynecology;  Laterality: N/A;  . RADIOACTIVE SEED GUIDED EXCISIONAL BREAST BIOPSY Left 12/30/2016   Procedure: RADIOACTIVE SEED GUIDED EXCISIONAL BREAST BIOPSY;  Surgeon: Stark Klein, MD;  Location: [place];  Service: General;  Laterality: Left;    FAMILY HISTORY: Family History  Problem Relation Age of Onset  . Asthma Mother   . Diabetes Mother   . Diabetes Father   . Heart attack Father   . Diabetes Brother   . Diabetes Brother   . Asthma Brother   . Heart Problems Sister   . Asthma Sister   . Hypertension Sister   . Heart disease Maternal Grandfather   . Osteopenia Sister   The patient's father died from heart disease at the age of 19.  The patient's mother is 67 years old as of March 2022.  The patient has 4 brothers and 2 sisters.  She is not aware of any history of cancer in her family.   GYNECOLOGIC HISTORY:  Patient's last menstrual period was 02/21/2014. Menarche: 60  years old GX P 0 LMP age 72 Contraceptive 1 year, no complications HRT no  Hysterectomy? no BSO? 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  Her husband [name] works as a Marketing executive for [employer].  He has 2 children from a prior marriage, both living in [place].  One is traveling nurse.  The other 1 is a Administrator.  The patient is a Restaurant manager, fast food.    ADVANCED DIRECTIVES: In the absence of any documentation to the contrary, the patient's spouse is their HCPOA.  The patient made it clear at her visit 04/18/2020 that she would not accept any blood transfusion or blood products for any reason including to stave off death   HEALTH MAINTENANCE: Social History   Tobacco Use  . Smoking status: Never Smoker  . Smokeless tobacco: Never Used  Vaping Use  . Vaping Use: Never used  Substance Use Topics  . Alcohol use: No  . Drug use: No     Colonoscopy: 10/2019 (Dr. Collene Mares), recall 2026  PAP: 03/2017, negative  Bone density: 09/2018, -0.8   Allergies  Allergen Reactions  . Lisinopril Cough    With SOB and  wheezing    Current Outpatient Medications  Medication Sig Dispense Refill  . albuterol (PROVENTIL HFA;VENTOLIN HFA) 108 (90 Base) MCG/ACT inhaler Inhale 1-2 puffs into the lungs every 4 (four) hours as needed for wheezing or shortness of breath. 18 g 2  . atorvastatin (LIPITOR) 10 MG tablet Take 10 mg by mouth daily.    . B-D ULTRA-FINE 33 LANCETS MISC Check BG 2 times/day    . glucose blood test strip Check BG 2 times/day.  Dx E11.65    . hydrochlorothiazide (HYDRODIURIL) 25 MG tablet TK 1 T PO D    . metFORMIN (GLUCOPHAGE) 500 MG tablet TAKE 1 TABLET(500 MG) BY MOUTH THREE TIMES DAILY 270 tablet 0  . Omega-3 Fatty Acids (OMEGA 3 PO) Take by mouth.     No current facility-administered medications for this visit.   SUPPLEMENTS: Aside from the medications listed above, the patient takes a teaspoon of olive oil daily, 1000 mg of vitamin C  every other day, sunflower lecithin 600 mg every other day, vitamin D3 5000 mg every other day, magnesium 200 mg every other day, milk thistle 150 mg every other day, a B complex vitamin (B6 and B12) every other day, as well as Osteo Bi-Flex weekly and fluticasone as needed  OBJECTIVE:   There were no vitals filed for this visit.   There is no height or weight on file to calculate BMI.   Wt Readings from Last 3 Encounters:  04/24/20 186 lb (84.4 kg)  04/18/20 186 lb (84.4 kg)  07/28/19 180 lb (81.6 kg)      ECOG FS:1 - Symptomatic but completely ambulatory  Telemedicine visit   LAB RESULTS:  CMP     Component Value Date/Time   NA 134 (L) 04/18/2020 1527   NA 138 11/04/2017 0000   K 4.1 04/18/2020 1527   CL 98 04/18/2020 1527   CO2 28 04/18/2020 1527   GLUCOSE 116 (H) 04/18/2020 1527   BUN 8 04/18/2020 1527   BUN 14 11/04/2017 0000   CREATININE 0.81 04/18/2020 1527   CREATININE 0.60 09/07/2014 0950   CALCIUM 10.3 04/18/2020 1527   PROT 7.9 04/18/2020 1527   ALBUMIN 3.9 04/18/2020 1527   AST 26 04/18/2020 1527   ALT 37 04/18/2020 1527   ALKPHOS 131 (H) 04/18/2020 1527   BILITOT 0.3 04/18/2020 1527   GFRNONAA >60 04/18/2020 1527   GFRAA >60 05/15/2019 1803    No results found for: TOTALPROTELP, ALBUMINELP, A1GS, A2GS, BETS, BETA2SER, GAMS, MSPIKE, SPEI  Lab Results  Component Value Date   WBC 7.9 04/18/2020   NEUTROABS 3.5 04/18/2020   HGB 12.8 04/18/2020   HCT 40.6 04/18/2020   MCV 93.1 04/18/2020   PLT 288 04/18/2020    No results found for: LABCA2  No components found for: YQMVHQ469  No results for input(s): INR in the last 168 hours.  No results found for: LABCA2  No results found for: GEX528  No results found for: UXL244  No results found for: WNU272  No results found for: CA2729  No components found for: HGQUANT  No results found for: CEA1 / No results found for: CEA1   No results found for: AFPTUMOR  No results found for:  CHROMOGRNA  No results found for: KPAFRELGTCHN, LAMBDASER, KAPLAMBRATIO (kappa/lambda light chains)  No results found for: HGBA, HGBA2QUANT, HGBFQUANT, HGBSQUAN (Hemoglobinopathy evaluation)   No results found for: LDH  Lab Results  Component Value Date   IRON 38 (L) 10/16/2009   IRONPCTSAT 10.2 (L) 10/16/2009   (  Iron and TIBC)  Lab Results  Component Value Date   FERRITIN 8.9 (L) 10/16/2009    Urinalysis    Component Value Date/Time   COLORURINE YELLOW 03/11/2009 1100   APPEARANCEUR CLEAR 03/11/2009 1100   LABSPEC 1.017 03/11/2009 1100   PHURINE 7.0 03/11/2009 1100   GLUCOSEU NEGATIVE 03/11/2009 1100   HGBUR LARGE (A) 03/11/2009 1100   BILIRUBINUR N 05/21/2015 1614   KETONESUR NEGATIVE 03/11/2009 1100   PROTEINUR N 05/21/2015 1614   PROTEINUR NEGATIVE 03/11/2009 1100   UROBILINOGEN negative 05/21/2015 1614   UROBILINOGEN 0.2 03/11/2009 1100   NITRITE N 05/21/2015 1614   NITRITE NEGATIVE 03/11/2009 1100   LEUKOCYTESUR Negative 05/21/2015 1614     STUDIES: US BREAST LTD UNI RIGHT INC AXILLA  Result Date: 04/10/2020 CLINICAL DATA:  Patient complains of a palpable abnormality in the 12 o'clock region of the right breast. EXAM: DIGITAL DIAGNOSTIC BILATERAL MAMMOGRAM WITH TOMOSYNTHESIS AND CAD; ULTRASOUND RIGHT BREAST LIMITED TECHNIQUE: Bilateral digital diagnostic mammography and breast tomosynthesis was performed. The images were evaluated with computer-aided detection.; Targeted ultrasound examination of the right breast was performed COMPARISON:  Previous exam(s). ACR Breast Density Category c: The breast tissue is heterogeneously dense, which may obscure small masses. FINDINGS: There is a developing asymmetry in the 12 o'clock region of the right breast. Excisional biopsy changes are seen in the upper-outer quadrant of the left breast. No malignant type microcalcifications identified in either breast. On physical exam, I palpate a discrete mass in the right breast at 12  o'clock 6 cm from the nipple. Targeted ultrasound is performed, showing an irregular hypoechoic mass in the right breast at 12 o'clock 6 cm from the nipple measuring 2.6 x 1.2 x 3.0 cm. Sonographic evaluation the right axilla does not show any enlarged adenopathy. IMPRESSION: Suspicious mass in the 12 o'clock region of the right breast. RECOMMENDATION: Ultrasound-guided core biopsy of the mass in the 12 o'clock region of the right breast is recommended. I have discussed the findings and recommendations with the patient. If applicable, a reminder letter will be sent to the patient regarding the next appointment. BI-RADS CATEGORY  5: Highly suggestive of malignancy. Electronically Signed   By: Lillia Mountain M.D.   On: 04/10/2020 10:54   MM DIAG BREAST TOMO BILATERAL  Result Date: 04/10/2020 CLINICAL DATA:  Patient complains of a palpable abnormality in the 12 o'clock region of the right breast. EXAM: DIGITAL DIAGNOSTIC BILATERAL MAMMOGRAM WITH TOMOSYNTHESIS AND CAD; ULTRASOUND RIGHT BREAST LIMITED TECHNIQUE: Bilateral digital diagnostic mammography and breast tomosynthesis was performed. The images were evaluated with computer-aided detection.; Targeted ultrasound examination of the right breast was performed COMPARISON:  Previous exam(s). ACR Breast Density Category c: The breast tissue is heterogeneously dense, which may obscure small masses. FINDINGS: There is a developing asymmetry in the 12 o'clock region of the right breast. Excisional biopsy changes are seen in the upper-outer quadrant of the left breast. No malignant type microcalcifications identified in either breast. On physical exam, I palpate a discrete mass in the right breast at 12 o'clock 6 cm from the nipple. Targeted ultrasound is performed, showing an irregular hypoechoic mass in the right breast at 12 o'clock 6 cm from the nipple measuring 2.6 x 1.2 x 3.0 cm. Sonographic evaluation the right axilla does not show any enlarged adenopathy.  IMPRESSION: Suspicious mass in the 12 o'clock region of the right breast. RECOMMENDATION: Ultrasound-guided core biopsy of the mass in the 12 o'clock region of the right breast is recommended. I have discussed  the findings and recommendations with the patient. If applicable, a reminder letter will be sent to the patient regarding the next appointment. BI-RADS CATEGORY  5: Highly suggestive of malignancy. Electronically Signed   By: Lillia Mountain M.D.   On: 04/10/2020 10:54   MM CLIP PLACEMENT RIGHT  Result Date: 04/10/2020 CLINICAL DATA:  Confirmation of clip placement after ultrasound-guided core needle biopsy of a highly suspicious 3.0 cm mass involving the UPPER RIGHT breast. EXAM: 2D AND TOMOSYNTHESIS DIAGNOSTIC RIGHT MAMMOGRAM POST ULTRASOUND BIOPSY COMPARISON:  Previous exam(s). FINDINGS: Tomosynthesis and synthesized full field CC and mediolateral images were obtained following ultrasound guided biopsy of a mass involving the UPPER RIGHT breast. The ribbon shaped tissue marking clip is appropriately positioned within the biopsied mass. Expected post biopsy changes are present without evidence of hematoma. IMPRESSION: Appropriate positioning of the ribbon shaped biopsy marking clip within biopsied mass in the UPPER RIGHT breast. The mass is predominantly obscured by the patient's adjacent dense fibroglandular tissue. Final Assessment: Post Procedure Mammograms for Marker Placement Electronically Signed   By: Evangeline Dakin M.D.   On: 04/10/2020 12:06   [place] RT BREAST BX W LOC DEV 1ST LESION IMG BX SPEC US GUIDE  Addendum Date: 04/11/2020   ADDENDUM REPORT: 04/11/2020 15:35 ADDENDUM: In the Clinical Data, I stated that the patient had a personal history of malignant LEFT breast lumpectomy in November, 2018. She actually had a high risk excisional biopsy of atypical lobular hyperplasia (lobular neoplasia) in September, 2018. Electronically Signed   By: Evangeline Dakin M.D.   On: 04/11/2020 15:35    Addendum Date: 04/11/2020   ADDENDUM REPORT: 04/11/2020 14:36 ADDENDUM: Pathology revealed GRADE III INVASIVE DUCTAL CARCINOMA, DUCTAL CARCINOMA IN SITU of the upper RIGHT breast, 12 o'clock 6cmfn. This was found to be concordant by Dr. Peggye Fothergill. Pathology results were discussed with the patient by telephone. The patient reported doing well after the biopsy with tenderness at the site. Post biopsy instructions and care were reviewed and questions were answered. The patient was encouraged to call [facility] for any additional concerns. Surgical consultation has been arranged with Dr. Stark Klein at [facility] on April 16, 2020. Pathology results reported by Stacie Acres RN on 04/11/2020. Electronically Signed   By: Evangeline Dakin M.D.   On: 04/11/2020 14:36   Result Date: 04/11/2020 CLINICAL DATA:  60 year old presenting with a palpable highly suspicious 3.0 cm mass in the UPPER RIGHT breast at the 12 o'clock position approximately 6 cm from the nipple. Personal history of malignant left breast lumpectomy in November, 2018. EXAM: ULTRASOUND GUIDED RIGHT BREAST CORE NEEDLE BIOPSY COMPARISON:  Previous exam(s). PROCEDURE: I met with the patient and we discussed the procedure of ultrasound-guided biopsy, including benefits and alternatives. We discussed the high likelihood of a successful procedure. We discussed the risks of the procedure, including infection, bleeding, tissue injury, clip migration, and inadequate sampling. Informed written consent was given. The usual time-out protocol was performed immediately prior to the procedure. Lesion quadrant: UPPER breast, 12 o'clock location. Using sterile technique with chlorhexidine as skin antisepsis, 1% lidocaine and 1% lidocaine with epinephrine as local anesthetic, under direct ultrasound visualization, a 12 gauge [employer] Marquee core needle device placed through an 11 gauge introducer needle was used to perform  biopsy of the mass in the UPPER RIGHT breast using a lateral approach. At the conclusion of the procedure a ribbon shaped tissue marker clip was deployed into the biopsy cavity. Follow up 2  view mammogram was performed and dictated separately. IMPRESSION: Ultrasound guided biopsy of a highly suspicious 3.0 cm mass involving the UPPER RIGHT breast. No apparent complications. Electronically Signed: By: Evangeline Dakin M.D. On: 04/10/2020 12:05     ELIGIBLE FOR AVAILABLE RESEARCH PROTOCOL: no  ASSESSMENT: 60 y.o. [place] woman status post right breast upper outer quadrant biopsy 04/10/2020 for a clinical T2N0, stage IIA invasive ductal carcinoma, grade 3, estrogen receptor positive, progesterone receptor and HER-2 negative, with an MIB-1 of 25%.  (1) Oncotype to be obtained from the original biopsy  (2) definitive surgery pending  (3) adjuvant chemotherapy if appropriate  (4) adjuvant radiation  (5) antiestrogens.   PLAN: Unfortunately we still do not have the Oncotype results for Laquinda but we reviewed how the Oncotype works and she understands that if her tumor falls in group 26 or greater (the Oncotype score) she will need chemotherapy.  The plan is for doxorubicin and cyclophosphamide followed by paclitaxel if she does receive chemo.  On the other hand if the Oncotype is 25 or less then she would go straight to radiation and she has a good understanding of what that involves.  She is on multiple supplements.  She understands that we have 0 reliable data to show whether any of these things she is taking is safe.  The people who provide these tell the patient that these are medicines but they tell the regulators of these are foods and who would want to do a study on olive oil.  Accordingly I cannot tell her that it is safe or not safe that she is taking these supplements but I am not uncomfortable with her continuing to take them so long as they do not involve megadoses of  vitamins.  Because these are not prescription drugs they are not included in our electronic medical record by a large so I have simply added them as and appendix to her medication list above.  We also reviewed her lab work which she suggests fatty liver.  We discussed the best diet for that which involves avoiding or minimizing carbohydrates.  She already has an excellent exercise program.  She is scheduled for an echo on 05/13/2020 and for surgery 05/21/2020.  If we decide she needs chemo she will have a port.  If she does not need chemo we will cancel the echo  Total encounter time 20 minutes.Sarajane Jews C. Alyia Lacerte, MD 05/02/2020 11:21 PM Medical Oncology and Hematology [facility] [place], [place] [address] Tel. [phone]    Fax. [phone]   This document serves as a record of services personally performed by Lurline Del, MD. It was created on his behalf by Wilburn Mylar, a trained medical scribe. The creation of this record is based on the scribe's personal observations and the provider's statements to them.   I, Lurline Del MD, have reviewed the above documentation for accuracy and completeness, and I agree with the above.   *Total Encounter Time as defined by the Centers for Medicare and Medicaid Services includes, in addition to the face-to-face time of a patient visit (documented in the note above) non-face-to-face time: obtaining and reviewing outside history, ordering and reviewing medications, tests or procedures, care coordination (communications with other health care professionals or caregivers) and documentation in the medical record.

## 2020-05-03 ENCOUNTER — Inpatient Hospital Stay (HOSPITAL_BASED_OUTPATIENT_CLINIC_OR_DEPARTMENT_OTHER): Payer: 59 | Admitting: Oncology

## 2020-05-03 DIAGNOSIS — C50411 Malignant neoplasm of upper-outer quadrant of right female breast: Secondary | ICD-10-CM

## 2020-05-03 DIAGNOSIS — Z170 Estrogen receptor positive status [ER+]: Secondary | ICD-10-CM | POA: Diagnosis not present

## 2020-05-10 ENCOUNTER — Encounter: Payer: Self-pay | Admitting: *Deleted

## 2020-05-13 ENCOUNTER — Ambulatory Visit (HOSPITAL_COMMUNITY): Payer: 59 | Attending: Internal Medicine

## 2020-05-13 ENCOUNTER — Other Ambulatory Visit: Payer: Self-pay

## 2020-05-13 DIAGNOSIS — Z170 Estrogen receptor positive status [ER+]: Secondary | ICD-10-CM

## 2020-05-13 DIAGNOSIS — C50411 Malignant neoplasm of upper-outer quadrant of right female breast: Secondary | ICD-10-CM | POA: Insufficient documentation

## 2020-05-13 LAB — ECHOCARDIOGRAM COMPLETE
Area-P 1/2: 2.75 cm2
S' Lateral: 2.8 cm

## 2020-05-14 ENCOUNTER — Telehealth: Payer: Self-pay | Admitting: Oncology

## 2020-05-14 ENCOUNTER — Encounter: Payer: Self-pay | Admitting: *Deleted

## 2020-05-14 ENCOUNTER — Other Ambulatory Visit: Payer: Self-pay | Admitting: Oncology

## 2020-05-14 ENCOUNTER — Telehealth: Payer: Self-pay | Admitting: *Deleted

## 2020-05-14 NOTE — Telephone Encounter (Signed)
Scheduled appt per 3/29 sch msg. Pt aware.  

## 2020-05-14 NOTE — Telephone Encounter (Signed)
Received Oncotype score of 40. Physician team notified.  Called pt after she spoke with Dr. Jana Hakim to ensure she did not have further questions. Denies questions or needs at this time.

## 2020-05-14 NOTE — Progress Notes (Signed)
I called Yolanda [name] and gave her the results of her Oncotype.  She is going to need chemo.  She agrees to report.  She will see me after the surgery to discuss the chemo further but she has already met with her chemo teaching nurse

## 2020-05-17 ENCOUNTER — Other Ambulatory Visit (HOSPITAL_COMMUNITY)
Admission: RE | Admit: 2020-05-17 | Discharge: 2020-05-17 | Disposition: A | Discharge status: Home / Self Care | Payer: 59 | Source: Ambulatory Visit | Attending: General Surgery | Admitting: General Surgery

## 2020-05-17 DIAGNOSIS — Z01812 Encounter for preprocedural laboratory examination: Secondary | ICD-10-CM | POA: Diagnosis present

## 2020-05-17 DIAGNOSIS — Z20822 Contact with and (suspected) exposure to covid-19: Secondary | ICD-10-CM | POA: Diagnosis not present

## 2020-05-17 HISTORY — PX: BREAST LUMPECTOMY: SHX2

## 2020-05-17 LAB — SARS CORONAVIRUS 2 (TAT 6-24 HRS): SARS Coronavirus 2: NEGATIVE

## 2020-05-20 ENCOUNTER — Encounter: Payer: Self-pay | Admitting: *Deleted

## 2020-05-20 ENCOUNTER — Other Ambulatory Visit: Payer: Self-pay | Admitting: General Surgery

## 2020-05-20 ENCOUNTER — Encounter (HOSPITAL_COMMUNITY): Payer: Self-pay | Admitting: General Surgery

## 2020-05-20 ENCOUNTER — Other Ambulatory Visit: Payer: Self-pay

## 2020-05-20 NOTE — Progress Notes (Signed)
Mrs Epperly denies chest pain or shortness of breath. Patient was tested for Covid and has been in quarantine since that time.  Mrs. Miles states that her CBG's have been running high, up to 150, , it has been < 70 a few times, but not recently.  Patient checks CBG 1 time a day.  I instructed Mrs. Stidham to not take Metformin in am. I instructed patient to check CBG after awaking and every 2 hours until arrival  to the hospital.  I Instructed patient if CBG is less than 70 to drink  1/2 cup of a clear juice. Recheck CBG in 15 minutes if CBG is not over 70 call, pre- op desk at [phone] for further instructions.

## 2020-05-20 NOTE — H&P (Signed)
Yolanda [name] Location: [facility] Surgery Patient #: [id number] DOB: 12-18-1960 Married / Language: English / Race: Black or African American Female   History of Present Illness The patient is a 60 year old female who presents for a follow-up for Breast cancer. Patient is a 60 year old female who I have seen for several years for high risk wtih [place]. She was originally referred by Dr. Jeanmarie Plant for a abnormal left mammogram 10/2016 with architectural distortion. This persisted on her diagnostic mammograms and ultrasound. 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 She was recommended to have an excisional biopsy. At that point, the patient had no history of cancer. She has no family history of breast cancer of which she is aware. 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 She underwent left seed localized bx 12/30/16. This showed atypical lobular hyperplasia. She saw oncology and based on her discussion opted NOT to take antiestrogens.   Pt palpated a mass super bowl weekend and scheduled imaging. It seemed to pop up very quickly. A mass was palpable and u/s showed a 2.6 cm mass at 12 o'clock. 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 Pt is here to discuss treatment.   She has no family cancer history and is nulliparous. Menarche was age 51.  dx mammogram/us 04/10/2020 CLINICAL DATA: Patient complains of a palpable abnormality in the 12 o'clock region of the right breast.  EXAM: DIGITAL DIAGNOSTIC BILATERAL MAMMOGRAM WITH TOMOSYNTHESIS AND CAD; ULTRASOUND RIGHT BREAST LIMITED  TECHNIQUE: Bilateral digital diagnostic mammography and breast tomosynthesis was performed. The images were evaluated with computer-aided detection.; Targeted ultrasound examination of the right breast was performed  COMPARISON: Previous exam(s).  ACR Breast Density Category c: The breast tissue is  heterogeneously dense, which may obscure small masses.  FINDINGS: There is a developing asymmetry in the 12 o'clock region of the right breast. Excisional biopsy changes are seen in the upper-outer quadrant of the left breast. No malignant type microcalcifications identified in either breast.  On physical exam, I palpate a discrete mass in the right breast at 12 o'clock 6 cm from the nipple.  Targeted ultrasound is performed, showing an irregular hypoechoic mass in the right breast at 12 o'clock 6 cm from the nipple measuring 2.6 x 1.2 x 3.0 cm. Sonographic evaluation the right axilla does not show any enlarged adenopathy.  IMPRESSION: Suspicious mass in the 12 o'clock region of the right breast.  RECOMMENDATION: Ultrasound-guided core biopsy of the mass in the 12 o'clock region of the right breast is recommended.  I have discussed the findings and recommendations with the patient. If applicable, a reminder letter will be sent to the patient regarding the next appointment.  BI-RADS CATEGORY 5: Highly suggestive of malignancy.    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 Microscopic Comment The carcinoma appears grade 3. Estrogen Receptor: 90%, POSITIVE, MODERATE STAINING INTENSITY Progesterone Receptor: 0%, NEGATIVE Proliferation Marker Ki67: 25% GROUP 5: HER2 **NEGATIVE**   Allergies  Lisinopril *ANTIHYPERTENSIVES*  Allergies Reconciled   Medication History Atorvastatin Calcium (10MG  Tablet, Oral) Active. MetFORMIN HCl ER (500MG  Tablet ER 24HR, Oral) Active. hydroCHLOROthiazide (25MG  Tablet, Oral) Active. Medications Reconciled    Review of Systems  All other systems negative  Vitals  Weight: 188 lb Height: 63in Body Surface Area: 1.88 m Body Mass Index: 33.3 kg/m  Temp.: 97.59F  Pulse: 94 (Regular)  P.OX:  93% (Room air) BP: 120/64(Sitting, Left Arm, Standard)       Physical Exam  General Mental Status-Alert. General Appearance-Consistent with stated age. Hydration-Well hydrated. Voice-Normal.  Head and Neck Head-normocephalic, atraumatic with no lesions or palpable masses. Trachea-midline. Thyroid Gland Characteristics - normal size and consistency.  Eye Eyeball - Bilateral-Extraocular movements intact. Sclera/Conjunctiva - Bilateral-No scleral icterus.  Chest and Lung Exam Chest and lung exam reveals -quiet, even and easy respiratory effort with no use of accessory muscles and on auscultation, normal breath sounds, no adventitious sounds and normal vocal resonance. Inspection Chest Wall - Normal. Back - normal.  Breast Note: breasts relatively symmetric. palpable 2.5-3 cm mass UOQ right breast. This is quite superficial, but not invading the skin. Ptotic bilaterally. no palpable masses or skin dimpling unless she leans over and then has a small contour abnormality in the UOQ of the left breast. circumareolar incision on the left has healed well. no LAD. no nipple retraction or nipple discharge. Some bruising from biopsy.   Cardiovascular Cardiovascular examination reveals -normal heart sounds, regular rate and rhythm with no murmurs and normal pedal pulses bilaterally.  Abdomen Inspection Inspection of the abdomen reveals - No Hernias. Palpation/Percussion Palpation and Percussion of the abdomen reveal - Soft, Non Tender, No Rebound tenderness, No Rigidity (guarding) and No hepatosplenomegaly. Auscultation Auscultation of the abdomen reveals - Bowel sounds normal.  Neurologic Neurologic evaluation reveals -alert and oriented x 3 with no impairment of recent or remote memory. Mental Status-Normal.  Musculoskeletal Global Assessment -Note: no gross deformities.  Normal Exam - Left-Upper Extremity Strength Normal and Lower  Extremity Strength Normal. Normal Exam - Right-Upper Extremity Strength Normal and Lower Extremity Strength Normal.  Lymphatic Head & Neck  General Head & Neck Lymphatics: Bilateral - Description - Normal. Axillary  General Axillary Region: Bilateral - Description - Normal. Tenderness - Non Tender. Femoral & Inguinal  Generalized Femoral & Inguinal Lymphatics: Bilateral - Description - No Generalized lymphadenopathy.    Assessment & Plan MALIGNANT NEOPLASM OF UPPER-OUTER QUADRANT OF RIGHT BREAST IN FEMALE, ESTROGEN RECEPTOR POSITIVE (C50.411) Impression: Pt has a new right breast cancer, cT2N0, +/-/-. I discussed surgical treatment for this regarding lumpectomy and mastectomy. She prefers lumpectomy. I also discussed possible MRI given her ALH, breast density C, and how rapidly this came up, but she is very claustrophobic and not interested in this.  I will plan lumpectomy (no seed) with sentinel lymph node biopsy. This would be followed by oncotype/mammaprint, radiation, and antihormone tx. She has an appointment with oncology (magrinat) later this week. I advised her that I will refer her to radiation as well.  The surgical procedure was described to the patient. I discussed the incision type and location and that we would need radiology involved on with a wire or seed marker and/or sentinel node.  The risks and benefits of the procedure were described to the patient and she wishes to proceed.  We discussed the risks bleeding, infection, damage to other structures, need for further procedures/surgeries. We discussed the risk of seroma. The patient was advised if the area in the breast in cancer, we may need to go back to surgery for additional tissue to obtain negative margins or for a lymph node biopsy. The patient was advised that these are the most common complications, but that others can occur as well. They were advised against taking aspirin or other anti-inflammatory agents/blood  thinners the week before surgery. Current Plans You are being scheduled  for surgery- Our schedulers will call you.  You should hear from our office's scheduling department within 5 working days about the location, date, and time of surgery. We try to make accommodations for patient's preferences in scheduling surgery, but sometimes the OR schedule or the surgeon's schedule prevents [place] from making those accommodations.  If you have not heard from our office [phone]) in 5 working days, call the office and ask for your surgeon's nurse.  If you have other questions about your diagnosis, plan, or surgery, call the office and ask for your surgeon's nurse.  Pt Education - flb breast cancer surgery: discussed with patient and provided information. Referred to Radiation Oncology, for evaluation and follow up (Radiation Oncology). Routine.

## 2020-05-21 ENCOUNTER — Ambulatory Visit: Payer: 59 | Admitting: Internal Medicine

## 2020-05-21 ENCOUNTER — Ambulatory Visit (HOSPITAL_COMMUNITY): Payer: 59 | Admitting: Anesthesiology

## 2020-05-21 ENCOUNTER — Encounter (HOSPITAL_COMMUNITY)
Admission: RE | Disposition: A | Discharge status: Home / Self Care | Payer: Self-pay | Source: Home / Self Care | Attending: General Surgery

## 2020-05-21 ENCOUNTER — Ambulatory Visit (HOSPITAL_COMMUNITY)
Admission: RE | Admit: 2020-05-21 | Discharge: 2020-05-21 | Disposition: A | Discharge status: Home / Self Care | Payer: 59 | Attending: General Surgery | Admitting: General Surgery

## 2020-05-21 ENCOUNTER — Ambulatory Visit (HOSPITAL_COMMUNITY): Payer: 59

## 2020-05-21 ENCOUNTER — Other Ambulatory Visit: Payer: Self-pay

## 2020-05-21 ENCOUNTER — Ambulatory Visit (HOSPITAL_COMMUNITY)
Admission: RE | Admit: 2020-05-21 | Discharge: 2020-05-21 | Disposition: A | Discharge status: Home / Self Care | Payer: 59 | Source: Ambulatory Visit | Attending: General Surgery | Admitting: General Surgery

## 2020-05-21 ENCOUNTER — Encounter (HOSPITAL_COMMUNITY): Payer: Self-pay | Admitting: General Surgery

## 2020-05-21 DIAGNOSIS — Z79899 Other long term (current) drug therapy: Secondary | ICD-10-CM | POA: Insufficient documentation

## 2020-05-21 DIAGNOSIS — N6011 Diffuse cystic mastopathy of right breast: Secondary | ICD-10-CM | POA: Insufficient documentation

## 2020-05-21 DIAGNOSIS — C50411 Malignant neoplasm of upper-outer quadrant of right female breast: Secondary | ICD-10-CM | POA: Insufficient documentation

## 2020-05-21 DIAGNOSIS — Z7984 Long term (current) use of oral hypoglycemic drugs: Secondary | ICD-10-CM | POA: Diagnosis not present

## 2020-05-21 DIAGNOSIS — Z170 Estrogen receptor positive status [ER+]: Secondary | ICD-10-CM | POA: Diagnosis not present

## 2020-05-21 DIAGNOSIS — Z419 Encounter for procedure for purposes other than remedying health state, unspecified: Secondary | ICD-10-CM

## 2020-05-21 DIAGNOSIS — Z888 Allergy status to other drugs, medicaments and biological substances status: Secondary | ICD-10-CM | POA: Insufficient documentation

## 2020-05-21 DIAGNOSIS — Z9889 Other specified postprocedural states: Secondary | ICD-10-CM

## 2020-05-21 DIAGNOSIS — C50211 Malignant neoplasm of upper-inner quadrant of right female breast: Secondary | ICD-10-CM

## 2020-05-21 HISTORY — PX: BREAST LUMPECTOMY WITH SENTINEL LYMPH NODE BIOPSY: SHX5597

## 2020-05-21 HISTORY — DX: Malignant (primary) neoplasm, unspecified: C80.1

## 2020-05-21 HISTORY — PX: PORTACATH PLACEMENT: SHX2246

## 2020-05-21 LAB — CBC
HCT: 39.1 % (ref 36.0–46.0)
Hemoglobin: 12.5 g/dL (ref 12.0–15.0)
MCH: 29.7 pg (ref 26.0–34.0)
MCHC: 32 g/dL (ref 30.0–36.0)
MCV: 92.9 fL (ref 80.0–100.0)
Platelets: 275 10*3/uL (ref 150–400)
RBC: 4.21 MIL/uL (ref 3.87–5.11)
RDW: 13.7 % (ref 11.5–15.5)
WBC: 6.7 10*3/uL (ref 4.0–10.5)
nRBC: 0 % (ref 0.0–0.2)

## 2020-05-21 LAB — GLUCOSE, CAPILLARY
Glucose-Capillary: 101 mg/dL — ABNORMAL HIGH (ref 70–99)
Glucose-Capillary: 152 mg/dL — ABNORMAL HIGH (ref 70–99)

## 2020-05-21 LAB — COMPREHENSIVE METABOLIC PANEL
ALT: 32 U/L (ref 0–44)
AST: 27 U/L (ref 15–41)
Albumin: 3.7 g/dL (ref 3.5–5.0)
Alkaline Phosphatase: 105 U/L (ref 38–126)
Anion gap: 9 (ref 5–15)
BUN: 10 mg/dL (ref 8–23)
CO2: 27 mmol/L (ref 22–32)
Calcium: 10.2 mg/dL (ref 8.9–10.3)
Chloride: 98 mmol/L (ref 98–111)
Creatinine, Ser: 0.57 mg/dL (ref 0.44–1.00)
GFR, Estimated: >60 mL/min (ref >60)
Glucose, Bld: 115 mg/dL — ABNORMAL HIGH (ref 70–99)
Potassium: 4 mmol/L (ref 3.5–5.1)
Sodium: 134 mmol/L — ABNORMAL LOW (ref 135–145)
Total Bilirubin: 0.6 mg/dL (ref 0.3–1.2)
Total Protein: 6.8 g/dL (ref 6.5–8.1)

## 2020-05-21 LAB — NO BLOOD PRODUCTS

## 2020-05-21 SURGERY — BREAST LUMPECTOMY WITH SENTINEL LYMPH NODE BX
Anesthesia: General | Site: Chest | Laterality: Right

## 2020-05-21 MED ORDER — MIDAZOLAM HCL 2 MG/2ML IJ SOLN: 0.5000 mg | Freq: Once | INTRAMUSCULAR | Status: DC | PRN

## 2020-05-21 MED ORDER — PHENYLEPHRINE 40 MCG/ML (10ML) SYRINGE FOR IV PUSH (FOR BLOOD PRESSURE SUPPORT)
PREFILLED_SYRINGE | INTRAVENOUS | Status: AC
  Filled 2020-05-21: qty 20

## 2020-05-21 MED ORDER — ALBUMIN HUMAN 5 % IV SOLN: INTRAVENOUS | Status: DC | PRN

## 2020-05-21 MED ORDER — OXYCODONE HCL 5 MG PO TABS: 5.0000 mg | ORAL_TABLET | Freq: Once | ORAL | Status: DC | PRN

## 2020-05-21 MED ORDER — EPHEDRINE 5 MG/ML INJ
INTRAVENOUS | Status: AC
  Filled 2020-05-21: qty 10

## 2020-05-21 MED ORDER — LIDOCAINE 2% (20 MG/ML) 5 ML SYRINGE
INTRAMUSCULAR | Status: AC
  Filled 2020-05-21: qty 5

## 2020-05-21 MED ORDER — METHYLENE BLUE 0.5 % INJ SOLN
INTRAVENOUS | Status: AC
  Filled 2020-05-21: qty 10

## 2020-05-21 MED ORDER — SODIUM CHLORIDE 0.9 % IV SOLN
INTRAVENOUS | Status: AC
  Filled 2020-05-21: qty 1.2

## 2020-05-21 MED ORDER — DEXMEDETOMIDINE (PRECEDEX) IN NS 20 MCG/5ML (4 MCG/ML) IV SYRINGE
PREFILLED_SYRINGE | INTRAVENOUS | Status: AC
  Filled 2020-05-21: qty 5

## 2020-05-21 MED ORDER — PROPOFOL 10 MG/ML IV BOLUS
INTRAVENOUS | Status: AC
  Filled 2020-05-21: qty 20

## 2020-05-21 MED ORDER — ORAL CARE MOUTH RINSE
15.0000 mL | Freq: Once | OROMUCOSAL | Status: AC
Start: 2020-05-21 — End: 2020-05-21

## 2020-05-21 MED ORDER — ACETAMINOPHEN 500 MG PO TABS: 1000.0000 mg | ORAL_TABLET | Freq: Once | ORAL | Status: DC

## 2020-05-21 MED ORDER — DEXAMETHASONE SODIUM PHOSPHATE 4 MG/ML IJ SOLN
INTRAMUSCULAR | Status: DC | PRN
  Administered 2020-05-21: 6 mg via INTRAVENOUS

## 2020-05-21 MED ORDER — LACTATED RINGERS IV SOLN: INTRAVENOUS | Status: DC

## 2020-05-21 MED ORDER — DEXMEDETOMIDINE (PRECEDEX) IN NS 20 MCG/5ML (4 MCG/ML) IV SYRINGE
PREFILLED_SYRINGE | INTRAVENOUS | Status: DC | PRN
  Administered 2020-05-21: 12 ug via INTRAVENOUS
  Administered 2020-05-21: 8 ug via INTRAVENOUS

## 2020-05-21 MED ORDER — TECHNETIUM TC 99M TILMANOCEPT KIT
1.0000 | PACK | Freq: Once | INTRAVENOUS | Status: AC | PRN
  Administered 2020-05-21: 1 via INTRADERMAL

## 2020-05-21 MED ORDER — FENTANYL CITRATE (PF) 100 MCG/2ML IJ SOLN
INTRAMUSCULAR | Status: AC
  Administered 2020-05-21: 100 ug via INTRAVENOUS
  Filled 2020-05-21: qty 2

## 2020-05-21 MED ORDER — SODIUM CHLORIDE 0.9 % IV SOLN
INTRAVENOUS | Status: DC | PRN
  Administered 2020-05-21: 500 mL

## 2020-05-21 MED ORDER — PROPOFOL 500 MG/50ML IV EMUL
INTRAVENOUS | Status: DC | PRN
  Administered 2020-05-21: 125 ug/kg/min via INTRAVENOUS

## 2020-05-21 MED ORDER — ENSURE PRE-SURGERY PO LIQD: 296.0000 mL | Freq: Once | ORAL | Status: DC

## 2020-05-21 MED ORDER — 0.9 % SODIUM CHLORIDE (POUR BTL) OPTIME
TOPICAL | Status: DC | PRN
  Administered 2020-05-21: 1000 mL

## 2020-05-21 MED ORDER — LIDOCAINE HCL (PF) 1 % IJ SOLN
INTRAMUSCULAR | Status: AC
  Filled 2020-05-21: qty 30

## 2020-05-21 MED ORDER — FENTANYL CITRATE (PF) 250 MCG/5ML IJ SOLN
INTRAMUSCULAR | Status: AC
  Filled 2020-05-21: qty 5

## 2020-05-21 MED ORDER — CHLORHEXIDINE GLUCONATE CLOTH 2 % EX PADS: 6.0000 | MEDICATED_PAD | Freq: Once | CUTANEOUS | Status: DC

## 2020-05-21 MED ORDER — DEXAMETHASONE SODIUM PHOSPHATE 10 MG/ML IJ SOLN
INTRAMUSCULAR | Status: AC
  Filled 2020-05-21: qty 1

## 2020-05-21 MED ORDER — CHLORHEXIDINE GLUCONATE 0.12 % MT SOLN
15.0000 mL | Freq: Once | OROMUCOSAL | Status: AC
  Administered 2020-05-21: 15 mL via OROMUCOSAL
  Filled 2020-05-21: qty 15

## 2020-05-21 MED ORDER — MIDAZOLAM HCL 2 MG/2ML IJ SOLN: 2.0000 mg | Freq: Once | INTRAMUSCULAR | Status: AC

## 2020-05-21 MED ORDER — OXYCODONE HCL 5 MG/5ML PO SOLN: 5.0000 mg | Freq: Once | ORAL | Status: DC | PRN

## 2020-05-21 MED ORDER — PROPOFOL 10 MG/ML IV BOLUS
INTRAVENOUS | Status: DC | PRN
  Administered 2020-05-21: 200 mg via INTRAVENOUS
  Administered 2020-05-21 (×2): 50 mg via INTRAVENOUS

## 2020-05-21 MED ORDER — PROPOFOL 1000 MG/100ML IV EMUL
INTRAVENOUS | Status: AC
  Filled 2020-05-21: qty 100

## 2020-05-21 MED ORDER — ACETAMINOPHEN 500 MG PO TABS
1000.0000 mg | ORAL_TABLET | ORAL | Status: AC
  Administered 2020-05-21: 1000 mg via ORAL
  Filled 2020-05-21: qty 2

## 2020-05-21 MED ORDER — BUPIVACAINE-EPINEPHRINE (PF) 0.5% -1:200000 IJ SOLN
INTRAMUSCULAR | Status: DC | PRN
  Administered 2020-05-21: 30 mL

## 2020-05-21 MED ORDER — PHENYLEPHRINE 40 MCG/ML (10ML) SYRINGE FOR IV PUSH (FOR BLOOD PRESSURE SUPPORT)
PREFILLED_SYRINGE | INTRAVENOUS | Status: DC | PRN
  Administered 2020-05-21 (×2): 80 ug via INTRAVENOUS

## 2020-05-21 MED ORDER — HEPARIN SOD (PORK) LOCK FLUSH 100 UNIT/ML IV SOLN
INTRAVENOUS | Status: DC | PRN
  Administered 2020-05-21: 500 [IU] via INTRAVENOUS

## 2020-05-21 MED ORDER — MIDAZOLAM HCL 2 MG/2ML IJ SOLN
INTRAMUSCULAR | Status: AC
  Administered 2020-05-21: 2 mg via INTRAVENOUS
  Filled 2020-05-21: qty 2

## 2020-05-21 MED ORDER — HEPARIN SOD (PORK) LOCK FLUSH 100 UNIT/ML IV SOLN
INTRAVENOUS | Status: AC
  Filled 2020-05-21: qty 5

## 2020-05-21 MED ORDER — SODIUM CHLORIDE (PF) 0.9 % IJ SOLN
INTRAMUSCULAR | Status: AC
  Filled 2020-05-21: qty 10

## 2020-05-21 MED ORDER — FENTANYL CITRATE (PF) 100 MCG/2ML IJ SOLN: 100.0000 ug | Freq: Once | INTRAMUSCULAR | Status: AC

## 2020-05-21 MED ORDER — MEPERIDINE HCL 25 MG/ML IJ SOLN: 6.2500 mg | INTRAMUSCULAR | Status: DC | PRN

## 2020-05-21 MED ORDER — ONDANSETRON HCL 4 MG/2ML IJ SOLN
INTRAMUSCULAR | Status: DC | PRN
  Administered 2020-05-21: 4 mg via INTRAVENOUS

## 2020-05-21 MED ORDER — FENTANYL CITRATE (PF) 100 MCG/2ML IJ SOLN
INTRAMUSCULAR | Status: DC | PRN
  Administered 2020-05-21: 50 ug via INTRAVENOUS
  Administered 2020-05-21: 25 ug via INTRAVENOUS
  Administered 2020-05-21 (×2): 50 ug via INTRAVENOUS
  Administered 2020-05-21: 25 ug via INTRAVENOUS
  Administered 2020-05-21: 50 ug via INTRAVENOUS

## 2020-05-21 MED ORDER — PROPOFOL 10 MG/ML IV BOLUS
INTRAVENOUS | Status: AC
  Filled 2020-05-21: qty 40

## 2020-05-21 MED ORDER — SCOPOLAMINE 1 MG/3DAYS TD PT72
1.0000 | MEDICATED_PATCH | TRANSDERMAL | Status: DC
  Administered 2020-05-21: 1.5 mg via TRANSDERMAL
  Filled 2020-05-21: qty 1

## 2020-05-21 MED ORDER — BUPIVACAINE-EPINEPHRINE (PF) 0.25% -1:200000 IJ SOLN
INTRAMUSCULAR | Status: AC
  Filled 2020-05-21: qty 30

## 2020-05-21 MED ORDER — LIDOCAINE HCL 1 % IJ SOLN
INTRAMUSCULAR | Status: DC | PRN
  Administered 2020-05-21: 10 mL
  Administered 2020-05-21 (×2): 20 mL

## 2020-05-21 MED ORDER — OXYCODONE HCL 5 MG PO TABS: 5.0000 mg | ORAL_TABLET | Freq: Four times a day (QID) | ORAL | 0 refills | Status: DC | PRN

## 2020-05-21 MED ORDER — CEFAZOLIN SODIUM-DEXTROSE 2-4 GM/100ML-% IV SOLN
2.0000 g | INTRAVENOUS | Status: AC
  Administered 2020-05-21: 2 g via INTRAVENOUS
  Filled 2020-05-21: qty 100

## 2020-05-21 MED ORDER — LIDOCAINE 2% (20 MG/ML) 5 ML SYRINGE
INTRAMUSCULAR | Status: DC | PRN
  Administered 2020-05-21: 60 mg via INTRAVENOUS

## 2020-05-21 MED ORDER — ONDANSETRON HCL 4 MG/2ML IJ SOLN
INTRAMUSCULAR | Status: AC
  Filled 2020-05-21: qty 2

## 2020-05-21 MED ORDER — HYDROMORPHONE HCL 1 MG/ML IJ SOLN: 0.2500 mg | INTRAMUSCULAR | Status: DC | PRN

## 2020-05-21 MED ORDER — PROMETHAZINE HCL 25 MG/ML IJ SOLN: 6.2500 mg | INTRAMUSCULAR | Status: DC | PRN

## 2020-05-21 MED ORDER — SODIUM CHLORIDE (PF) 0.9 % IJ SOLN
INTRAVENOUS | Status: DC | PRN
  Administered 2020-05-21: 5 mL

## 2020-05-21 MED ORDER — TECHNETIUM TC 99M TILMANOCEPT KIT: 1.0000 | PACK | Freq: Once | INTRAVENOUS | Status: DC | PRN

## 2020-05-21 SURGICAL SUPPLY — 73 items
ADH SKN CLS APL DERMABOND .7 (GAUZE/BANDAGES/DRESSINGS) ×2
APL PRP STRL LF DISP 70% ISPRP (MISCELLANEOUS) ×2
BAG DECANTER FOR FLEXI CONT (MISCELLANEOUS) ×3 IMPLANT
BINDER BREAST LRG (GAUZE/BANDAGES/DRESSINGS) IMPLANT
BINDER BREAST XLRG (GAUZE/BANDAGES/DRESSINGS) ×1 IMPLANT
BINDER BREAST XXLRG (GAUZE/BANDAGES/DRESSINGS) ×1 IMPLANT
BNDG COHESIVE 4X5 TAN STRL (GAUZE/BANDAGES/DRESSINGS) ×3 IMPLANT
CANISTER SUCT 3000ML PPV (MISCELLANEOUS) ×3 IMPLANT
CHLORAPREP W/TINT 26 (MISCELLANEOUS) ×3 IMPLANT
CLIP VESOCCLUDE LG 6/CT (CLIP) ×3 IMPLANT
CLIP VESOCCLUDE MED 6/CT (CLIP) ×6 IMPLANT
CLIP VESOCCLUDE SM WIDE 6/CT (CLIP) ×2 IMPLANT
CNTNR URN SCR LID CUP LEK RST (MISCELLANEOUS) IMPLANT
CONT SPEC 4OZ STRL OR WHT (MISCELLANEOUS) ×12
COVER PROBE W GEL 5X96 (DRAPES) ×3 IMPLANT
COVER SURGICAL LIGHT HANDLE (MISCELLANEOUS) ×3 IMPLANT
COVER TRANSDUCER ULTRASND GEL (DISPOSABLE) IMPLANT
COVER WAND RF STERILE (DRAPES) ×3 IMPLANT
DECANTER SPIKE VIAL GLASS SM (MISCELLANEOUS) ×6 IMPLANT
DERMABOND ADVANCED (GAUZE/BANDAGES/DRESSINGS) ×1
DERMABOND ADVANCED .7 DNX12 (GAUZE/BANDAGES/DRESSINGS) ×2 IMPLANT
DEVICE DUBIN SPECIMEN MAMMOGRA (MISCELLANEOUS) IMPLANT
DRAPE C-ARM 42X120 X-RAY (DRAPES) ×3 IMPLANT
DRAPE CHEST BREAST 15X10 FENES (DRAPES) ×2 IMPLANT
DRAPE SURG 17X23 STRL (DRAPES) IMPLANT
DRAPE WARM FLUID 44X44 (DRAPES) IMPLANT
DRSG PAD ABDOMINAL 8X10 ST (GAUZE/BANDAGES/DRESSINGS) ×1 IMPLANT
ELECT COATED BLADE 2.86 ST (ELECTRODE) ×3 IMPLANT
ELECT NDL BLADE 2-5/6 (NEEDLE) ×2 IMPLANT
ELECT NEEDLE BLADE 2-5/6 (NEEDLE) ×3 IMPLANT
ELECT REM PT RETURN 9FT ADLT (ELECTROSURGICAL) ×3
ELECTRODE REM PT RTRN 9FT ADLT (ELECTROSURGICAL) ×2 IMPLANT
GAUZE 4X4 16PLY RFD (DISPOSABLE) ×3 IMPLANT
GEL ULTRASOUND 20GR AQUASONIC (MISCELLANEOUS) IMPLANT
GLOVE BIO SURGEON STRL SZ 6 (GLOVE) ×3 IMPLANT
GLOVE SURG UNDER LTX SZ6.5 (GLOVE) ×3 IMPLANT
GOWN STRL REUS W/ TWL LRG LVL3 (GOWN DISPOSABLE) ×2 IMPLANT
GOWN STRL REUS W/TWL 2XL LVL3 (GOWN DISPOSABLE) ×3 IMPLANT
GOWN STRL REUS W/TWL LRG LVL3 (GOWN DISPOSABLE) ×3
KIT BASIN OR (CUSTOM PROCEDURE TRAY) ×3 IMPLANT
KIT MARKER MARGIN INK (KITS) ×3 IMPLANT
KIT TURNOVER KIT B (KITS) ×3 IMPLANT
LIGHT WAVEGUIDE WIDE FLAT (MISCELLANEOUS) IMPLANT
MESH VENTRALIGHT ST 8X10 (Mesh General) ×1 IMPLANT
NDL 18GX1X1/2 (RX/OR ONLY) (NEEDLE) IMPLANT
NDL FILTER BLUNT 18X1 1/2 (NEEDLE) IMPLANT
NDL HYPO 25GX1X1/2 BEV (NEEDLE) ×2 IMPLANT
NEEDLE 18GX1X1/2 (RX/OR ONLY) (NEEDLE) ×3 IMPLANT
NEEDLE 22X1 1/2 (OR ONLY) (NEEDLE) ×3 IMPLANT
NEEDLE FILTER BLUNT 18X 1/2SAF (NEEDLE)
NEEDLE FILTER BLUNT 18X1 1/2 (NEEDLE) IMPLANT
NEEDLE HYPO 25GX1X1/2 BEV (NEEDLE) ×3 IMPLANT
NS IRRIG 1000ML POUR BTL (IV SOLUTION) ×3 IMPLANT
PACK GENERAL/GYN (CUSTOM PROCEDURE TRAY) ×3 IMPLANT
PACK UNIVERSAL I (CUSTOM PROCEDURE TRAY) ×3 IMPLANT
PAD ARMBOARD 7.5X6 YLW CONV (MISCELLANEOUS) ×3 IMPLANT
PENCIL BUTTON HOLSTER BLD 10FT (ELECTRODE) ×3 IMPLANT
POSITIONER HEAD DONUT 9IN (MISCELLANEOUS) ×3 IMPLANT
SPONGE LAP 18X18 RF (DISPOSABLE) ×1 IMPLANT
STOCKINETTE IMPERVIOUS 9X36 MD (GAUZE/BANDAGES/DRESSINGS) ×3 IMPLANT
SUT MNCRL AB 4-0 PS2 18 (SUTURE) ×4 IMPLANT
SUT MON AB 4-0 PC3 18 (SUTURE) ×3 IMPLANT
SUT PROLENE 2 0 SH DA (SUTURE) ×6 IMPLANT
SUT VIC AB 3-0 SH 27 (SUTURE) ×3
SUT VIC AB 3-0 SH 27X BRD (SUTURE) ×2 IMPLANT
SUT VIC AB 3-0 SH 8-18 (SUTURE) ×3 IMPLANT
SYR 5ML LUER SLIP (SYRINGE) ×3 IMPLANT
SYR CONTROL 10ML LL (SYRINGE) ×3 IMPLANT
TOWEL GREEN STERILE (TOWEL DISPOSABLE) ×3 IMPLANT
TOWEL GREEN STERILE FF (TOWEL DISPOSABLE) ×3 IMPLANT
TRAY LAPAROSCOPIC MC (CUSTOM PROCEDURE TRAY) ×2 IMPLANT
TUBE CONNECTING 12X1/4 (SUCTIONS) IMPLANT
YANKAUER SUCT BULB TIP NO VENT (SUCTIONS) ×1 IMPLANT

## 2020-05-21 NOTE — Anesthesia Procedure Notes (Signed)
Anesthesia Regional Block: Pectoralis block   Pre-Anesthetic Checklist: ,, timeout performed, Correct Patient, Correct Site, Correct Laterality, Correct Procedure, Correct Position, site marked, Risks and benefits discussed,  Surgical consent,  Pre-op evaluation,  At surgeon's request and post-op pain management  Laterality: Right  Prep: chloraprep       Needles:  Injection technique: Single-shot  Needle Type: Echogenic Needle     Needle Length: 9cm  Needle Gauge: 21     Additional Needles:   Procedures:,,,, ultrasound used (permanent image in chart),,,,  Narrative:  Start time: 05/21/2020 8:48 AM End time: 05/21/2020 8:55 AM Injection made incrementally with aspirations every 5 mL.  Performed by: Personally  Anesthesiologist: Annye Asa, MD  Additional Notes: Pt identified in Holding room.  Monitors applied. Working IV access confirmed. Sterile prep R clavicle and pec.  #21gaECHOgenic block needle between pec minor and serratus, between ribs 4,5 with US guidance.  30cc 0.5% Bupivacaine with 1:200k epi injected incrementally after negative test dose.  Patient asymptomatic, VSS, no heme aspirated, tolerated well.  Jenita Seashore, MD

## 2020-05-21 NOTE — Transfer of Care (Signed)
Immediate Anesthesia Transfer of Care Note  Patient: Yolanda [name]  Procedure(s) Performed: RIGHT BREAST LUMPECTOMY WITH SENTINEL LYMPH NODE BIOPSY (Right Breast) INSERTION PORT-A-CATH (Left Chest)  Patient Location: PACU  Anesthesia Type:General  Level of Consciousness: awake and alert   Airway & Oxygen Therapy: Patient Spontanous Breathing and Patient connected to face mask oxygen  Post-op Assessment: Report given to RN and Post -op Vital signs reviewed and stable  Post vital signs: Reviewed and stable  Last Vitals:  Vitals Value Taken Time  BP    Temp    Pulse 84 05/21/20 1209  Resp 18 05/21/20 1209  SpO2 96 % 05/21/20 1209  Vitals shown include unvalidated device data.  Last Pain:  Vitals:   05/21/20 0908  TempSrc:   PainSc: 0-No pain      Patients Stated Pain Goal: 4 (56/21/30 8657)  Complications: No complications documented.

## 2020-05-21 NOTE — Anesthesia Procedure Notes (Signed)
Procedure Name: LMA Insertion Date/Time: 05/21/2020 9:45 AM Performed by: Lieutenant Diego, CRNA Pre-anesthesia Checklist: Patient identified, Emergency Drugs available, Suction available and Patient being monitored Patient Re-evaluated:Patient Re-evaluated prior to induction Oxygen Delivery Method: Circle system utilized Preoxygenation: Pre-oxygenation with 100% oxygen Induction Type: IV induction Ventilation: Mask ventilation without difficulty LMA: LMA inserted LMA Size: 4.0 Number of attempts: 1 Placement Confirmation: positive ETCO2 and breath sounds checked- equal and bilateral Tube secured with: Tape Dental Injury: Teeth and Oropharynx as per pre-operative assessment

## 2020-05-21 NOTE — Anesthesia Postprocedure Evaluation (Signed)
Anesthesia Post Note  Patient: ALYSSON GEIST  Procedure(s) Performed: RIGHT BREAST LUMPECTOMY WITH SENTINEL LYMPH NODE BIOPSY (Right Breast) INSERTION PORT-A-CATH (Left Chest)     Patient location during evaluation: PACU Anesthesia Type: General Level of consciousness: awake and alert, patient cooperative and oriented Pain management: pain level controlled Vital Signs Assessment: post-procedure vital signs reviewed and stable Respiratory status: spontaneous breathing, nonlabored ventilation and respiratory function stable Cardiovascular status: blood pressure returned to baseline and stable Postop Assessment: no apparent nausea or vomiting and able to ambulate Anesthetic complications: no   No complications documented.  Last Vitals:  Vitals:   05/21/20 1253 05/21/20 1304  BP: (!) 142/81 139/79  Pulse: 67 73  Resp: 12 10  Temp:  37.1 C  SpO2: 97% 93%    Last Pain:  Vitals:   05/21/20 1304  TempSrc:   PainSc: 0-No pain                 Jeromey Kruer,E. Marshel Golubski

## 2020-05-21 NOTE — Progress Notes (Signed)
CMP and CBC labs sent at [phone]. Labs still in process. Per lab, will resulted within the hour. Per Dr. Glennon Mac, okay to proceed with sx using previous CMP and CBC from 04/18/20.

## 2020-05-21 NOTE — Op Note (Signed)
Right Breast Lumpectomy with Sentinel Node Mapping and Biopsy, Left subclavian Port placement  Indications: This patient presents with history of right breast cancer with clinically negative axillary lymph node exam.  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 The risks, benefits, complications, treatment options, and expected outcomes were discussed with the patient. The possibilities of reaction to medication, pulmonary aspiration, bleeding, infection, the need for additional procedures, failure to diagnose a condition, and creating a complication requiring transfusion or operation were discussed with the patient. The patient concurred with the proposed plan, giving informed consent.  The site of surgery properly noted/marked. The patient was taken to Operating Room # 2, identified as Yolanda [name] and the procedure verified as right Breast Lumpectomy and Sentinel Node Biopsy, port placement. A Time Out was held and the above information confirmed.  After induction of anesthesia, the bilateral breast, chest, and right arm were prepped and draped in standard fashion.  Local anesthetic was administered over this   area at the angle of the clavicle.  The vein was accessed with 1 pass(es) of the needle. There was good venous return and the wire passed easily with no ectopy.   Fluoroscopy was used to confirm that the wire was in the vena cava.      Blue dye was injected into the subareolar location. The patient was placed back level and the area for the pocket was anethetized with local anesthetic.  A 3-cm transverse incision  was made with a #15   blade.  Cautery was used to divide the subcutaneous tissues down to the   pectoralis muscle.  An [employer] retractor was used to elevate the skin   while a pocket was created on top of the pectoralis fascia.  The port   was placed into the pocket to confirm that it was of adequate size.  The   catheter was preattached to the port.  The port was then secured to the   pectoralis fascia with four 2-0 Prolene sutures.  These were clamped and   not tied down yet.    The catheter was tunneled through to the wire exit site.  The catheter was placed along the wire to determine what length it should be to be in the SVC.  The catheter was cut at 24.5 cm.  The tunneler sheath and dilator were passed over the wire and the dilator and wire were removed.  The catheter was advanced through the tunneler sheath and the tunneler sheath was pulled away.  Care was taken to keep the catheter in the tunneler sheath as this occurred. This was advanced and the tunneler sheath was removed.  There was good venous  return and easy flush of the catheter.  The Prolene sutures were tied   down to the pectoral fascia.  The skin was reapproximated using 3-0   Vicryl interrupted deep dermal sutures.    Fluoroscopy was used to re-confirm good position of the catheter.  The skin   was then closed using 4-0 Monocryl in a subcuticular fashion.  The port was flushed with concentrated heparin flush as well.  The wounds were then cleaned, dried, and dressed with Dermabond.  The lumpectomy was performed by creating a crescent incision near the mass incorporating the skin directly over the mass.  Skin hooks were used to elevate the skin and the cautery was used to dissect around the mass.  The specimen was marked with the margin marker paint kit.  The specimen was placed in the faxatron to confirm presence on the biopsy clip.  Hemostasis was achieved with cautery.  Additional inferior and lateral margins were taken.  Large clips were placed on the specimen cavity edges for radiation.   The wound was irrigated and closed with a 3-0 Vicryl deep dermal interrupted and a 4-0 Monocryl subcuticular closure in layers.  Using a hand-held gamma probe, axillary sentinel nodes were identified transcutaneously.  An oblique incision was created below the axillary hairline.  Dissection was carried through the clavipectoral fascia.  4 deep level 2 axillary sentinel nodes were removed. Lymphovascular channels were clipped.  The wound was irrigated.  Hemostasis was achieved with cautery and clips.  The axillary incision was closed with 3-0 vicryl deep dermal interrupted sutures and 4-0 monocryl subcuticular closure in layers.      Sterile dressings were applied. At the end of the operation, all sponge, instrument, and needle counts were correct.  Findings: grossly clear surgical margins and anterior margin is skin, SLN #1 and #2 palpable, SLN#3 cps 25, SLN #4 cps 29, background count 2.    Estimated Blood Loss:  Minimal         Specimens: right breast lumpectomy, four sentinel nodes right axilla                Complications:  None; patient tolerated the procedure well.         Disposition: PACU - hemodynamically stable.         Condition: stable

## 2020-05-21 NOTE — Discharge Instructions (Addendum)
[facility] °Office Phone Number [phone] ° °BREAST BIOPSY/ PARTIAL MASTECTOMY: POST OP INSTRUCTIONS ° °Always review your discharge instruction sheet given to you by the facility where your surgery was performed. ° °IF YOU HAVE DISABILITY OR FAMILY LEAVE FORMS, YOU MUST BRING THEM TO THE OFFICE FOR PROCESSING.  DO NOT GIVE THEM TO YOUR DOCTOR. ° °1. A prescription for pain medication may be given to you upon discharge.  Take your pain medication as prescribed, if needed.  If narcotic pain medicine is not needed, then you may take acetaminophen (Tylenol) or ibuprofen (Advil) as needed. °2. Take your usually prescribed medications unless otherwise directed °3. If you need a refill on your pain medication, please contact your pharmacy.  They will contact our office to request authorization.  Prescriptions will not be filled after 5pm or on week-ends. °4. You should eat very light the first 24 hours after surgery, such as soup, crackers, pudding, etc.  Resume your normal diet the day after surgery. °5. Most patients will experience some swelling and bruising in the breast.  Ice packs and a good support bra will help.  Swelling and bruising can take several days to resolve.  °6. It is common to experience some constipation if taking pain medication after surgery.  Increasing fluid intake and taking a stool softener will usually help or prevent this problem from occurring.  A mild laxative (Milk of Magnesia or Miralax) should be taken according to package directions if there are no bowel movements after 48 hours. °7. Unless discharge instructions indicate otherwise, you may remove your bandages 48 hours after surgery, and you may shower at that time.  You may have steri-strips (small skin tapes) in place directly over the incision.  These strips should be left on the skin for 7-10 days.   Any sutures or staples will be removed at the office during your follow-up visit. °8. ACTIVITIES:  You may resume  regular daily activities (gradually increasing) beginning the next day.  Wearing a good support bra or sports bra (or the breast binder) minimizes pain and swelling.  You may have sexual intercourse when it is comfortable. °a. You may drive when you no longer are taking prescription pain medication, you can comfortably wear a seatbelt, and you can safely maneuver your car and apply brakes. °b. RETURN TO WORK:  __________1 week_______________ °9. You should see your doctor in the office for a follow-up appointment approximately two weeks after your surgery.  Your doctor’s nurse will typically make your follow-up appointment when she calls you with your pathology report.  Expect your pathology report 2-3 business days after your surgery.  You may call to check if you do not hear from us after three days. ° ° °WHEN TO CALL YOUR DOCTOR: °1. Fever over 101.0 °2. Nausea and/or vomiting. °3. Extreme swelling or bruising. °4. Continued bleeding from incision. °5. Increased pain, redness, or drainage from the incision. ° °The clinic staff is available to answer your questions during regular business hours.  Please don’t hesitate to call and ask to speak to one of the nurses for clinical concerns.  If you have a medical emergency, go to the nearest emergency room or call 911.  A surgeon from [facility] is always on call at the hospital. ° °For further questions, please visit [email or web address]  ° °

## 2020-05-21 NOTE — Anesthesia Preprocedure Evaluation (Addendum)
Anesthesia Evaluation  Patient identified by MRN, date of birth, ID band Patient awake    Reviewed: Allergy & Precautions, NPO status , Patient's Chart, lab work & pertinent test results  History of Anesthesia Complications (+) PONV  Airway Mallampati: II  TM Distance: >3 FB Neck ROM: Full    Dental  (+) Dental Advisory Given   Pulmonary sleep apnea and Continuous Positive Airway Pressure Ventilation , COPD,  COPD inhaler,  05/17/2020 SARS coronavirus NEG   breath sounds clear to auscultation       Cardiovascular hypertension, Pt. on medications (-) angina Rhythm:Regular Rate:Normal  04/2020 ECHO: EF 55-60%, no significant valvular disease   Neuro/Psych negative neurological ROS     GI/Hepatic Neg liver ROS, GERD  Medicated and Controlled,  Endo/Other  diabetes (glu 101), Oral Hypoglycemic Agents  Renal/GU negative Renal ROS     Musculoskeletal   Abdominal   Peds  Hematology negative hematology ROS (+) JEHOVAH'S WITNESS  Anesthesia Other Findings   Reproductive/Obstetrics                            Anesthesia Physical Anesthesia Plan  ASA: III  Anesthesia Plan: General   Post-op Pain Management: GA combined w/ Regional for post-op pain   Induction: Intravenous  PONV Risk Score and Plan: 4 or greater and Ondansetron, Dexamethasone and Scopolamine patch - Pre-op  Airway Management Planned: LMA  Additional Equipment: None  Intra-op Plan:   Post-operative Plan: Extubation in OR  Informed Consent: I have reviewed the patients History and Physical, chart, labs and discussed the procedure including the risks, benefits and alternatives for the proposed anesthesia with the patient or authorized representative who has indicated his/her understanding and acceptance.     Dental advisory given  Plan Discussed with: CRNA and Surgeon  Anesthesia Plan Comments: (Plan routine monitors, GA  with pec block for post op analgesia)       Anesthesia Quick Evaluation

## 2020-05-22 ENCOUNTER — Encounter (HOSPITAL_COMMUNITY): Payer: Self-pay | Admitting: General Surgery

## 2020-05-23 LAB — SURGICAL PATHOLOGY

## 2020-05-27 ENCOUNTER — Encounter: Payer: Self-pay | Admitting: *Deleted

## 2020-05-28 ENCOUNTER — Encounter: Payer: Self-pay | Admitting: Oncology

## 2020-05-30 ENCOUNTER — Other Ambulatory Visit: Payer: Self-pay | Admitting: Oncology

## 2020-06-02 NOTE — Progress Notes (Signed)
[facility]  [phone] [phone]     ID: LAREINA ESPINO DOB: 1961-02-05  MR#: [id number]  HQI#:[id number]  Patient Care Team: Mosie Lukes, MD as PCP - General (Family Medicine) Michel Bickers, MD as Consulting Physician (Infectious Diseases) Megan Salon, MD as Consulting Physician (Gynecology) Nakota Ackert, Virgie Dad, MD as Consulting Physician (Oncology) Juanita Craver, MD as Consulting Physician (Gastroenterology) Stark Klein, MD as Consulting Physician (General Surgery) Mauro Kaufmann, RN as Oncology Nurse Navigator Rockwell Germany, RN as Oncology Nurse Navigator Chauncey Cruel, MD OTHER MD:   CHIEF COMPLAINT: Estrogen receptor positive breast cancer  CURRENT TREATMENT: Adjuvant chemotherapy   INTERVAL HISTORY: Yolanda [name] returns today for follow up of her estrogen receptor positive breast cancer.  She is accompanied by her husband Yolanda [name]  Since her last visit, she underwent echocardiogram on 05/13/2020 showing an ejection fraction of 55-60%.  Oncotype DX was obtained on the core biopsy sample and the recurrence score of 40 predicts a risk of recurrence outside the breast over the next 9 years of 28%, if the patient's only systemic therapy is an antiestrogen for 5 years.  It also predicts a significant benefit from chemotherapy.  She proceeded to right lumpectomy on 05/21/2020 under Dr. Barry Dienes. Pathology from the procedure ([id number]) showed: invasive ductal carcinoma, grade 3, 3 cm; ductal carcinoma in situ; final margins negative for carcinoma.  All 7 biopsied lymph nodes were negative for carcinoma (0/7).  She also had a port placed at the time of surgery.   REVIEW OF SYSTEMS: Yolanda [name] did well with her surgery, with only mild pain, no significant bleeding, fever, or other complications.  She is having no difficulty with the port.  A detailed review of systems today was otherwise stable.   COVID 19 VACCINATION STATUS: fully  vaccinated [employer]), with booster 12/2019   HISTORY OF CURRENT ILLNESS: From the original intake note:   I saw Ms. Finamore in December 2018 for evaluation of atypical lobular hyperplasia.  We discussed various strategies regarding breast cancer prevention at that time for her to consider.    More recently (around the time of the Super Bowl 2022) she found a lump in her right breast.  She underwent bilateral diagnostic mammography with tomography and right breast ultrasonography at [facility] on 04/10/2020 showing: breast density category C; palpable 3 cm mass in right breast at 12 o'clock; no enlarged adenopathy in right axilla.  Accordingly on the same day, she proceeded to biopsy of the right breast area in question. The pathology from this procedure ([id number]) showed: invasive ductal carcinoma, grade 3; ductal carcinoma in situ. Prognostic indicators significant for: estrogen receptor, 90% positive with moderate staining intensity and progesterone receptor, 0% negative. Proliferation marker Ki67 at 25%. HER2 equivocal by immunohistochemistry (2+), but negative by fluorescent in situ hybridization with a signals ratio 1.49 and number per cell 2.75.  Cancer Staging Malignant neoplasm of upper-outer quadrant of right breast in female, estrogen receptor positive ([place]) Staging form: Breast, AJCC 8th Edition - Clinical: Stage IIA (cT2, cN0, cM0, G3, ER+, PR+, HER2-) - Signed by Chauncey Cruel, MD on 04/18/2020  The patient's subsequent history is as detailed below.   PAST MEDICAL HISTORY: Past Medical History:  Diagnosis Date  . Asthma    with respiratory infection  . Cancer [facility])    breast cancer - right  . Diabetes mellitus without complication ([place])    type II  . Fatty liver   . Fibroid uterus   .  Hypertension    Patient reports that she has never been diagnosied with HTN, "I take HCTZ for swelling in my feet, if needed."  . Osteoarthritis 03/10/2015  . PONV  (postoperative nausea and vomiting)    2014  . Refusal of blood transfusions as patient is Jehovah's Witness    NO BLOOD PRODUCTS  . Sleep apnea     PAST SURGICAL HISTORY: Past Surgical History:  Procedure Laterality Date  . BREAST CYST ASPIRATION    . BREAST EXCISIONAL BIOPSY    . BREAST LUMPECTOMY WITH SENTINEL LYMPH NODE BIOPSY Right 05/21/2020   Procedure: RIGHT BREAST LUMPECTOMY WITH SENTINEL LYMPH NODE BIOPSY;  Surgeon: Stark Klein, MD;  Location: [facility];  Service: General;  Laterality: Right;  . CHOLECYSTECTOMY  1/11  . COLONOSCOPY    . DILATATION & CURRETTAGE/HYSTEROSCOPY WITH RESECTOCOPE N/A 03/13/2014   Procedure: DILATATION & CURETTAGE/HYSTEROSCOPY ;  Surgeon: Lyman Speller, MD;  Location: [facility];  Service: Gynecology;  Laterality: N/A;  . PORTACATH PLACEMENT Left 05/21/2020   Procedure: INSERTION PORT-A-CATH;  Surgeon: Stark Klein, MD;  Location: [facility];  Service: General;  Laterality: Left;  . RADIOACTIVE SEED GUIDED EXCISIONAL BREAST BIOPSY Left 12/30/2016   Procedure: RADIOACTIVE SEED GUIDED EXCISIONAL BREAST BIOPSY;  Surgeon: Stark Klein, MD;  Location: [facility];  Service: General;  Laterality: Left;    FAMILY HISTORY: Family History  Problem Relation Age of Onset  . Asthma Mother   . Diabetes Mother   . Diabetes Father   . Heart attack Father   . Diabetes Brother   . Diabetes Brother   . Asthma Brother   . Heart Problems Sister   . Asthma Sister   . Hypertension Sister   . Heart disease Maternal Grandfather   . Osteopenia Sister   The patient's father died from heart disease at the age of 27.  The patient's mother is 74 years old as of March 2022.  The patient has 4 brothers and 2 sisters.  She is not aware of any history of cancer in her family.   GYNECOLOGIC HISTORY:  Patient's last menstrual period was 02/21/2014. Menarche: 61 years old GX P 0 LMP age 55 Contraceptive 1 year, no complications HRT no  Hysterectomy? no BSO?  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  Her husband Yolanda [name] works as a Marketing executive for [employer].  He has 2 children from a prior marriage, both living in [place].  One is traveling nurse.  The other 1 is a Administrator.  The patient is a Restaurant manager, fast food.    ADVANCED DIRECTIVES: In the absence of any documentation to the contrary, the patient's spouse is their HCPOA.  The patient made it clear at her visit 04/18/2020 that she would not accept any blood transfusion or blood products for any reason including to stave off death   HEALTH MAINTENANCE: Social History   Tobacco Use  . Smoking status: Never Smoker  . Smokeless tobacco: Never Used  Vaping Use  . Vaping Use: Never used  Substance Use Topics  . Alcohol use: No  . Drug use: No     Colonoscopy: 10/2019 (Dr. Collene Mares), recall 2026  PAP: 03/2017, negative  Bone density: 09/2018, -0.8   Allergies  Allergen Reactions  . Lisinopril Shortness Of Breath and Cough    wheezing    Current Outpatient Medications  Medication Sig Dispense Refill  . albuterol (PROVENTIL HFA;VENTOLIN HFA) 108 (90 Base) MCG/ACT inhaler Inhale  1-2 puffs into the lungs every 4 (four) hours as needed for wheezing or shortness of breath. 18 g 2  . atorvastatin (LIPITOR) 10 MG tablet Take 10 mg by mouth every other day.    . b complex vitamins capsule Take 1 capsule by mouth every other day.    . B-D ULTRA-FINE 33 LANCETS MISC Check BG 2 times/day    . Carboxymethylcellul-Glycerin (REFRESH OPTIVE OP) Place 1 drop into both eyes 3 (three) times daily as needed (dry / irritated eyes).    . cholecalciferol (VITAMIN D3) 25 MCG (1000 UNIT) tablet Take 1,000 Units by mouth every other day.    . fluticasone (FLONASE) 50 MCG/ACT nasal spray Place 1 spray into both nostrils daily as needed for allergies or rhinitis.    Marland Kitchen glucose blood test strip Check BG 2 times/day.  Dx E11.65    . hydrochlorothiazide (HYDRODIURIL) 25 MG tablet Take  25 mg by mouth every other day.    . hydrocortisone cream 1 % Apply 1 application topically daily as needed for itching.    . magnesium oxide (MAG-OX) 400 MG tablet Take 400 mg by mouth every other day.    . metFORMIN (GLUCOPHAGE-XR) 500 MG 24 hr tablet Take 1,000 mg by mouth 2 (two) times daily.    . milk thistle 175 MG tablet Take 175 mg by mouth every other day.    . Omega-3 Fatty Acids (OMEGA 3 PO) Take 1 capsule by mouth every other day.    Marland Kitchen omeprazole (PRILOSEC) 40 MG capsule Take 40 mg by mouth daily as needed (acid reflux).    Marland Kitchen oxyCODONE (OXY IR/ROXICODONE) 5 MG immediate release tablet Take 1 tablet (5 mg total) by mouth every 6 (six) hours as needed for severe pain. 10 tablet 0  . SOYA LECITHIN PO Take 1 capsule by mouth every other day.    . vitamin C (ASCORBIC ACID) 500 MG tablet Take 500 mg by mouth every other day.     No current facility-administered medications for this visit.   SUPPLEMENTS: Aside from the medications listed above, the patient takes a teaspoon of olive oil daily, 1000 mg of vitamin C every other day, sunflower lecithin 600 mg every other day, vitamin D3 5000 mg every other day, magnesium 200 mg every other day, milk thistle 150 mg every other day, a B complex vitamin (B6 and B12) every other day, as well as Osteo Bi-Flex weekly and fluticasone as needed  OBJECTIVE: Middle-aged woman in no acute distress  Vitals:   06/03/20 1554  BP: (!) 142/81  Pulse: 88  Resp: 20  Temp: 97.9 F (36.6 C)  SpO2: 97%     Body mass index is 28.99 kg/m.   Wt Readings from Last 3 Encounters:  06/03/20 185 lb 1.6 oz (84 kg)  05/21/20 187 lb (84.8 kg)  04/24/20 186 lb (84.4 kg)      ECOG FS:1 - Symptomatic but completely ambulatory  Sclerae unicteric, EOMs intact Wearing a mask No cervical or supraclavicular adenopathy Lungs no rales or rhonchi Heart regular rate and rhythm Abd soft, nontender, positive bowel sounds MSK no focal spinal tenderness, no upper  extremity lymphedema Neuro: nonfocal, well oriented, appropriate affect Breasts: The right breast is status post recent lumpectomy.  The incision is healing nicely.  There is no dehiscence, erythema, or swelling.  The cosmetic result is very good.  Left breast and both axillae are benign.   LAB RESULTS:  CMP     Component Value Date/Time  NA 134 (L) 05/21/2020 0823   NA 138 11/04/2017 0000   K 4.0 05/21/2020 0823   CL 98 05/21/2020 0823   CO2 27 05/21/2020 0823   GLUCOSE 115 (H) 05/21/2020 0823   BUN 10 05/21/2020 0823   BUN 14 11/04/2017 0000   CREATININE 0.57 05/21/2020 0823   CREATININE 0.81 04/18/2020 1527   CREATININE 0.60 09/07/2014 0950   CALCIUM 10.2 05/21/2020 0823   PROT 6.8 05/21/2020 0823   ALBUMIN 3.7 05/21/2020 0823   AST 27 05/21/2020 0823   AST 26 04/18/2020 1527   ALT 32 05/21/2020 0823   ALT 37 04/18/2020 1527   ALKPHOS 105 05/21/2020 0823   BILITOT 0.6 05/21/2020 0823   BILITOT 0.3 04/18/2020 1527   GFRNONAA >60 05/21/2020 0823   GFRNONAA >60 04/18/2020 1527   GFRAA >60 05/15/2019 1803    No results found for: TOTALPROTELP, ALBUMINELP, A1GS, A2GS, BETS, BETA2SER, GAMS, MSPIKE, SPEI  Lab Results  Component Value Date   WBC 6.7 05/21/2020   NEUTROABS 3.5 04/18/2020   HGB 12.5 05/21/2020   HCT 39.1 05/21/2020   MCV 92.9 05/21/2020   PLT 275 05/21/2020    No results found for: LABCA2  No components found for: [id number]  No results for input(s): INR in the last 168 hours.  No results found for: LABCA2  No results found for: XBL390  No results found for: ZES923  No results found for: RAQ762  No results found for: CA2729  No components found for: HGQUANT  No results found for: CEA1 / No results found for: CEA1   No results found for: AFPTUMOR  No results found for: CHROMOGRNA  No results found for: KPAFRELGTCHN, LAMBDASER, KAPLAMBRATIO (kappa/lambda light chains)  No results found for: HGBA, HGBA2QUANT, HGBFQUANT,  HGBSQUAN (Hemoglobinopathy evaluation)   No results found for: LDH  Lab Results  Component Value Date   IRON 38 (L) 10/16/2009   IRONPCTSAT 10.2 (L) 10/16/2009   (Iron and TIBC)  Lab Results  Component Value Date   FERRITIN 8.9 (L) 10/16/2009    Urinalysis    Component Value Date/Time   COLORURINE YELLOW 03/11/2009 1100   APPEARANCEUR CLEAR 03/11/2009 1100   LABSPEC 1.017 03/11/2009 1100   PHURINE 7.0 03/11/2009 1100   GLUCOSEU NEGATIVE 03/11/2009 1100   HGBUR LARGE (A) 03/11/2009 1100   BILIRUBINUR N 05/21/2015 1614   KETONESUR NEGATIVE 03/11/2009 1100   PROTEINUR N 05/21/2015 1614   PROTEINUR NEGATIVE 03/11/2009 1100   UROBILINOGEN negative 05/21/2015 1614   UROBILINOGEN 0.2 03/11/2009 1100   NITRITE N 05/21/2015 1614   NITRITE NEGATIVE 03/11/2009 1100   LEUKOCYTESUR Negative 05/21/2015 1614    STUDIES: NM Sentinel Node Inj-No Rpt (Breast)  Result Date: 05/21/2020 Sulfur colloid was injected by the nuclear medicine technologist for melanoma sentinel node.   DG CHEST PORT 1 VIEW  Result Date: 05/21/2020 CLINICAL DATA:  Port-A-Cath placement EXAM: PORTABLE CHEST 1 VIEW COMPARISON:  May 15, 2019 FINDINGS: Port-A-Cath tip is at the cavoatrial junction. No pneumothorax. There is no edema or airspace opacity. Heart is mildly enlarged with pulmonary vascularity normal. No adenopathy. Surgical clips overlie each breast region. IMPRESSION: Port-A-Cath tip at cavoatrial junction. No pneumothorax. No edema or airspace opacity. Stable cardiac prominence. Electronically Signed   By: Lowella Grip III M.D.   On: 05/21/2020 12:53   DG Fluoro Guide CV Line-No Report  Result Date: 05/21/2020 Fluoroscopy was utilized by the requesting physician.  No radiographic interpretation.   ECHOCARDIOGRAM COMPLETE  Result Date: 05/13/2020  ECHOCARDIOGRAM REPORT   Patient Name:   Yolanda [name] Date of Exam: 05/13/2020 Medical Rec #:  [id number]        Height:       63.0 in Accession #:     [id number]       Weight:       186.0 lb Date of Birth:  Dec 19, 1960        BSA:          1.875 m Patient Age:    18 years         BP:           159/75 mmHg Patient Gender: F                HR:           69 bpm. Exam Location:  [facility] Procedure: 2D Echo, 3D Echo, Cardiac Doppler, Color Doppler and Strain Analysis Indications:    Z09 Chemotherapy  History:        Patient has no prior history of Echocardiogram examinations.                 Risk Factors:Hypertension, Diabetes, Family History of Coronary                 Artery Disease and Sleep Apnea. Right Breast Cancer                 (pre-Chemotherapy).  Sonographer:    Deliah [place] RDCS Referring Phys: [place]  1. Global longitudinal strain is -191%. Left ventricular ejection fraction, by estimation, is 55 to 60%. The left ventricle has normal function. The left ventricle has no regional wall motion abnormalities. Left ventricular diastolic parameters were normal.  2. Right ventricular systolic function is low normal. The right ventricular size is normal. There is normal pulmonary artery systolic pressure.  3. The mitral valve is normal in structure. Trivial mitral valve regurgitation.  4. The aortic valve is normal in structure. Aortic valve regurgitation is not visualized.  5. The inferior vena cava is normal in size with greater than 50% respiratory variability, suggesting right atrial pressure of 3 mmHg. FINDINGS  Left Ventricle: Global longitudinal strain is -191%. Left ventricular ejection fraction, by estimation, is 55 to 60%. The left ventricle has normal function. The left ventricle has no regional wall motion abnormalities. The left ventricular internal cavity size was normal in size. There is no left ventricular hypertrophy. Left ventricular diastolic parameters were normal. Right Ventricle: The right ventricular size is normal. Right vetricular wall thickness was not assessed. Right ventricular systolic function is  low normal. There is normal pulmonary artery systolic pressure. The tricuspid regurgitant velocity is 1.76 m/s, and with an assumed right atrial pressure of 3 mmHg, the estimated right ventricular systolic pressure is 62.9 mmHg. Left Atrium: Left atrial size was normal in size. Right Atrium: Right atrial size was normal in size. Pericardium: There is no evidence of pericardial effusion. Mitral Valve: The mitral valve is normal in structure. Trivial mitral valve regurgitation. Tricuspid Valve: The tricuspid valve is normal in structure. Tricuspid valve regurgitation is trivial. Aortic Valve: The aortic valve is normal in structure. Aortic valve regurgitation is not visualized. Pulmonic Valve: The pulmonic valve was not well visualized. Pulmonic valve regurgitation is trivial. Aorta: The aortic root is normal in size and structure. Venous: The inferior vena cava is normal in size with greater than 50% respiratory variability, suggesting right atrial pressure of 3 mmHg. IAS/Shunts: No atrial  level shunt detected by color flow Doppler.  LEFT VENTRICLE PLAX 2D LVIDd:         3.80 cm  Diastology LVIDs:         2.80 cm  LV e' medial:    9.46 cm/s LV PW:         1.00 cm  LV E/e' medial:  5.9 LV IVS:        0.80 cm  LV e' lateral:   10.30 cm/s LVOT diam:     2.00 cm  LV E/e' lateral: 5.4 LV SV:         78 LV SV Index:   42       2D Longitudinal Strain LVOT Area:     3.14 cm 2D Strain GLS (A2C):   18.0 %                         2D Strain GLS (A3C):   14.8 %                         2D Strain GLS (A4C):   24.7 %                         2D Strain GLS Avg:     19.1 %                          3D Volume EF:                         3D EF:        65 %                         LV EDV:       127 ml                         LV ESV:       44 ml                         LV SV:        83 ml RIGHT VENTRICLE RV S prime:     8.83 cm/s TAPSE (M-mode): 1.7 cm LEFT ATRIUM             Index       RIGHT ATRIUM           Index LA diam:        3.90 cm  2.08 cm/m  RA Area:     14.70 cm LA Vol (A2C):   56.3 ml 30.03 ml/m RA Volume:   39.90 ml  21.28 ml/m LA Vol (A4C):   57.6 ml 30.72 ml/m LA Biplane Vol: 57.9 ml 30.88 ml/m  AORTIC VALVE LVOT Vmax:   107.50 cm/s LVOT Vmean:  73.950 cm/s LVOT VTI:    0.248 m  AORTA Ao Root diam: 3.10 cm Ao Asc diam:  3.10 cm MITRAL VALVE               TRICUSPID VALVE MV Area (PHT)  cm         TR Peak grad:   12.4 mmHg MV Decel Time: 276 msec    TR Vmax:        176.00 cm/s MV E  velocity: 56.00 cm/s MV A velocity: 87.85 cm/s  SHUNTS MV E/A ratio:  0.64        Systemic VTI:  0.25 m                            Systemic Diam: 2.00 cm Dorris Carnes MD Electronically signed by Dorris Carnes MD Signature Date/Time: 05/13/2020/3:38:31 PM    Final      ELIGIBLE FOR AVAILABLE RESEARCH PROTOCOL: no  ASSESSMENT: 60 y.o. [place] woman status post right breast upper outer quadrant biopsy 04/10/2020 for a clinical T2N0, stage IIA invasive ductal carcinoma, grade 3, estrogen receptor positive, progesterone receptor and HER-2 negative, with an MIB-1 of 25%.  (1) Oncotype obtained from the original biopsy shows a score of 48, predicting a recurrence rate outside the breast within 9 years of 28% if the only systemic therapy is antiestrogen for 5 years.  It also predicts a greater than 15% benefit from chemotherapy.  (2) right lumpectomy and sentinel lymph node dissection 05/21/2020 shows a pT2 pN0, stage IIB invasive ductal carcinoma, grade 3, with negative margins.  (3) adjuvant chemotherapy to consist of cyclophosphamide and doxorubicin in dose dense fashion x4 starting 06/18/2020, with Neulasta day 3; to be followed by paclitaxel weekly x12.  (a) echo 05/13/2020 shows an ejection fraction in the 55-60% range  (4) adjuvant radiation  (5) antiestrogens.   PLAN: Yolanda [name] did very well with her surgery and today we reviewed the pathology, which shows a stage IIb breast cancer.  She understands that it is very favorable that all sentinel  lymph nodes were clear.  At the same time this does raise the risk of lymphedema and she will need physical therapy, the use of compression sleeves and generally to take precautions which we began to discuss today.  We went over the Oncotype report in question.  She understands the reason she needs chemotherapy in order to have a good long-term prognosis.  We discussed standard of care AC followed by T and she has a good understanding of the possible toxicities side effects and complications of these agents.  She has also met with our chemotherapy teaching nurse and gone over that material with her as well.  At this point we are hopeful we can start treatment on 06/18/2020.  She will have her immune booster shot 2 days later and she will return to see me on day 8 or 9 of the first cycle to check nadir counts and decide whether or not she will need prophylactic antibiotics.  And has a good understanding of this plan and is in agreement with proceeding.  Total encounter time 45 minutes.Sarajane Jews C. Acy Orsak, MD 06/03/2020 5:31 PM Medical Oncology and Hematology [facility] [place], [place] [address] Tel. [phone]    Fax. [phone]   This document serves as a record of services personally performed by Lurline Del, MD. It was created on his behalf by Wilburn Mylar, a trained medical scribe. The creation of this record is based on the scribe's personal observations and the provider's statements to them.   I, Lurline Del MD, have reviewed the above documentation for accuracy and completeness, and I agree with the above.   *Total Encounter Time as defined by the Centers for Medicare and Medicaid Services includes, in addition to the face-to-face time of a patient visit (documented in the note above) non-face-to-face time: obtaining and reviewing outside history, ordering and  reviewing medications, tests or procedures, care coordination (communications  with other health care professionals or caregivers) and documentation in the medical record.

## 2020-06-03 ENCOUNTER — Other Ambulatory Visit: Payer: Self-pay

## 2020-06-03 ENCOUNTER — Inpatient Hospital Stay: Payer: 59 | Attending: Oncology | Admitting: Oncology

## 2020-06-03 VITALS — BP 142/81 | HR 88 | Temp 97.9°F | Resp 20 | Ht 67.0 in | Wt 185.1 lb

## 2020-06-03 DIAGNOSIS — Z170 Estrogen receptor positive status [ER+]: Secondary | ICD-10-CM | POA: Diagnosis not present

## 2020-06-03 DIAGNOSIS — C50411 Malignant neoplasm of upper-outer quadrant of right female breast: Secondary | ICD-10-CM

## 2020-06-03 MED ORDER — LIDOCAINE-PRILOCAINE 2.5-2.5 % EX CREA: TOPICAL_CREAM | CUTANEOUS | 3 refills | Status: DC

## 2020-06-03 MED ORDER — DEXAMETHASONE 4 MG PO TABS: 4.0000 mg | ORAL_TABLET | Freq: Two times a day (BID) | ORAL | 1 refills | Status: DC

## 2020-06-03 MED ORDER — PROCHLORPERAZINE MALEATE 10 MG PO TABS: 10.0000 mg | ORAL_TABLET | Freq: Four times a day (QID) | ORAL | 1 refills | Status: DC | PRN

## 2020-06-03 MED ORDER — LORATADINE 10 MG PO TABS: 10.0000 mg | ORAL_TABLET | Freq: Every day | ORAL | 0 refills | Status: DC

## 2020-06-03 NOTE — Progress Notes (Signed)
START ON PATHWAY REGIMEN - Breast     Cycles 1 through 4: A cycle is every 14 days:     Doxorubicin      Cyclophosphamide      Pegfilgrastim-xxxx    Cycles 5 through 16: A cycle is every 7 days:     Paclitaxel   **Always confirm dose/schedule in your pharmacy ordering system**  Patient Characteristics: Postoperative without Neoadjuvant Therapy (Pathologic Staging), Invasive Disease, Adjuvant Therapy, HER2 Negative/Unknown/Equivocal, ER Positive, Node Negative, pT1a-c, pN0/N44mi or pT2 or Higher, pN0, Oncotype High Risk (? 26) Therapeutic Status: Postoperative without Neoadjuvant Therapy (Pathologic Staging) AJCC Grade: GX AJCC N Category: pNX AJCC M Category: cM0 ER Status: Positive (+) AJCC 8 Stage Grouping: IIB HER2 Status: Negative (-) Oncotype Dx Recurrence Score: 40 AJCC T Category: pTX PR Status: Negative (-) Adjuvant Therapy Status: No Adjuvant Therapy Received Yet or Changing Initial Adjuvant Regimen due to Tolerance Has this patient completed genomic testing<= Yes - Oncotype DX(R) Intent of Therapy: Curative Intent, Discussed with Patient

## 2020-06-04 ENCOUNTER — Encounter: Payer: Self-pay | Admitting: *Deleted

## 2020-06-04 MED ORDER — FILGRASTIM-SNDZ 300 MCG/0.5ML IJ SOSY
PREFILLED_SYRINGE | INTRAMUSCULAR | Status: AC
  Filled 2020-06-04: qty 0.5

## 2020-06-10 ENCOUNTER — Encounter: Payer: Self-pay | Admitting: *Deleted

## 2020-06-10 ENCOUNTER — Ambulatory Visit (INDEPENDENT_AMBULATORY_CARE_PROVIDER_SITE_OTHER): Payer: 59 | Admitting: Otolaryngology

## 2020-06-10 ENCOUNTER — Encounter (INDEPENDENT_AMBULATORY_CARE_PROVIDER_SITE_OTHER): Payer: Self-pay | Admitting: Otolaryngology

## 2020-06-10 ENCOUNTER — Other Ambulatory Visit: Payer: Self-pay

## 2020-06-10 VITALS — Temp 97.3°F

## 2020-06-10 DIAGNOSIS — H6123 Impacted cerumen, bilateral: Secondary | ICD-10-CM

## 2020-06-10 NOTE — Progress Notes (Signed)
HPI: Yolanda [name] is a 60 y.o. female who returns today for evaluation of questionable Q-tip in the left ear.  She was using Q-tips and has some pain and dizziness when she used in the left ear and thought she might have a portion of the Q-tip left in the left ear canal.  She has had previous history of fungal external otitis..  Past Medical History:  Diagnosis Date  . Asthma    with respiratory infection  . Cancer [facility])    breast cancer - right  . Diabetes mellitus without complication ([place])    type II  . Fatty liver   . Fibroid uterus   . Hypertension    Patient reports that she has never been diagnosied with HTN, "I take HCTZ for swelling in my feet, if needed."  . Osteoarthritis 03/10/2015  . PONV (postoperative nausea and vomiting)    2014  . Refusal of blood transfusions as patient is Jehovah's Witness    NO BLOOD PRODUCTS  . Sleep apnea    Past Surgical History:  Procedure Laterality Date  . BREAST CYST ASPIRATION    . BREAST EXCISIONAL BIOPSY    . BREAST LUMPECTOMY WITH SENTINEL LYMPH NODE BIOPSY Right 05/21/2020   Procedure: RIGHT BREAST LUMPECTOMY WITH SENTINEL LYMPH NODE BIOPSY;  Surgeon: Stark Klein, MD;  Location: [place];  Service: General;  Laterality: Right;  . CHOLECYSTECTOMY  1/11  . COLONOSCOPY    . DILATATION & CURRETTAGE/HYSTEROSCOPY WITH RESECTOCOPE N/A 03/13/2014   Procedure: DILATATION & CURETTAGE/HYSTEROSCOPY ;  Surgeon: Lyman Speller, MD;  Location: [facility];  Service: Gynecology;  Laterality: N/A;  . PORTACATH PLACEMENT Left 05/21/2020   Procedure: INSERTION PORT-A-CATH;  Surgeon: Stark Klein, MD;  Location: [place];  Service: General;  Laterality: Left;  . RADIOACTIVE SEED GUIDED EXCISIONAL BREAST BIOPSY Left 12/30/2016   Procedure: RADIOACTIVE SEED GUIDED EXCISIONAL BREAST BIOPSY;  Surgeon: Stark Klein, MD;  Location: [place];  Service: General;  Laterality: Left;   Social History   Socioeconomic History  . Marital status:  Married    Spouse name: Not on file  . Number of children: Not on file  . Years of education: Not on file  . Highest education level: Not on file  Occupational History  . Not on file  Tobacco Use  . Smoking status: Never Smoker  . Smokeless tobacco: Never Used  Vaping Use  . Vaping Use: Never used  Substance and Sexual Activity  . Alcohol use: No  . Drug use: No  . Sexual activity: Not Currently    Partners: Male    Birth control/protection: Post-menopausal  Other Topics Concern  . Not on file  Social History Narrative  . Not on file   Social Determinants of Health   Financial Resource Strain: Low Risk   . Difficulty of Paying Living Expenses: Not hard at all  Food Insecurity: No Food Insecurity  . Worried About Charity fundraiser in the Last Year: Never true  . Ran Out of Food in the Last Year: Never true  Transportation Needs: No Transportation Needs  . Lack of Transportation (Medical): No  . Lack of Transportation (Non-Medical): No  Physical Activity: Not on file  Stress: Not on file  Social Connections: Unknown  . Frequency of Communication with Friends and Family: Not on file  . Frequency of Social Gatherings with Friends and Family: Not on file  . Attends Religious Services: Not on file  . Active Member of Clubs  or Organizations: Not on file  . Attends Archivist Meetings: Not on file  . Marital Status: Married   Family History  Problem Relation Age of Onset  . Asthma Mother   . Diabetes Mother   . Diabetes Father   . Heart attack Father   . Diabetes Brother   . Diabetes Brother   . Asthma Brother   . Heart Problems Sister   . Asthma Sister   . Hypertension Sister   . Heart disease Maternal Grandfather   . Osteopenia Sister    Allergies  Allergen Reactions  . Lisinopril Shortness Of Breath and Cough    wheezing   Prior to Admission medications   Medication Sig Start Date End Date Taking? Authorizing Provider  albuterol (PROVENTIL  HFA;VENTOLIN HFA) 108 (90 Base) MCG/ACT inhaler Inhale 1-2 puffs into the lungs every 4 (four) hours as needed for wheezing or shortness of breath. 07/23/17   Mosie Lukes, MD  atorvastatin (LIPITOR) 10 MG tablet Take 10 mg by mouth every other day.    [provider]  b complex vitamins capsule Take 1 capsule by mouth every other day.    [provider]  B-D ULTRA-FINE 33 LANCETS MISC Check BG 2 times/day 12/12/15   [provider]  Carboxymethylcellul-Glycerin (REFRESH OPTIVE OP) Place 1 drop into both eyes 3 (three) times daily as needed (dry / irritated eyes).    [provider]  cholecalciferol (VITAMIN D3) 25 MCG (1000 UNIT) tablet Take 1,000 Units by mouth every other day.    [provider]  dexamethasone (DECADRON) 4 MG tablet Take 1 tablet (4 mg total) by mouth 2 (two) times daily. Take daily for 3 days after chemo. Take with food. 06/03/20   Magrinat, Virgie Dad, MD  fluticasone (FLONASE) 50 MCG/ACT nasal spray Place 1 spray into both nostrils daily as needed for allergies or rhinitis.    [provider]  glucose blood test strip Check BG 2 times/day.  Dx E11.65 12/12/15   [provider]  hydrochlorothiazide (HYDRODIURIL) 25 MG tablet Take 25 mg by mouth every other day. 07/07/18   [provider]  hydrocortisone cream 1 % Apply 1 application topically daily as needed for itching.    [provider]  lidocaine-prilocaine (EMLA) cream Apply to affected area once 06/03/20   Magrinat, Virgie Dad, MD  loratadine (CLARITIN) 10 MG tablet Take 1 tablet (10 mg total) by mouth daily. 06/03/20   Magrinat, Virgie Dad, MD  magnesium oxide (MAG-OX) 400 MG tablet Take 400 mg by mouth every other day.    [provider]  metFORMIN (GLUCOPHAGE-XR) 500 MG 24 hr tablet Take 1,000 mg by mouth 2 (two) times daily. 04/23/20   [provider]  milk thistle 175 MG tablet Take 175 mg by mouth every other day.    [provider]  Omega-3 Fatty Acids (OMEGA 3 PO) Take 1 capsule by mouth every other day.    [provider]  omeprazole (PRILOSEC) 40 MG capsule Take 40 mg by mouth daily as needed (acid reflux).    [provider]  oxyCODONE (OXY IR/ROXICODONE) 5 MG immediate release tablet Take 1 tablet (5 mg total) by mouth every 6 (six) hours as needed for severe pain. 05/21/20   Stark Klein, MD  prochlorperazine (COMPAZINE) 10 MG tablet Take 1 tablet (10 mg total) by mouth every 6 (six) hours as needed (Nausea or vomiting). 06/03/20   Magrinat, Virgie Dad, MD  SOYA LECITHIN  PO Take 1 capsule by mouth every other day.    [provider]  vitamin C (ASCORBIC ACID) 500 MG tablet Take 500 mg by mouth every other day.    [provider]     Positive ROS: Otherwise negative  All other systems have been reviewed and were otherwise negative with the exception of those mentioned in the HPI and as above.  Physical Exam: Constitutional: Alert, well-appearing, no acute distress Ears: External ears without lesions or tenderness.  She has some wax buildup in both ear canals that was cleaned with curettes.  Of note she had some small fungal elements in both ear canals but these were not infective and she had no pain or inflammatory changes.  The little fungal elements were on top of the wax that was mostly removed on both sides.  TMs were clear bilaterally.  No foreign body noted. Nasal: External nose without lesions.. Clear nasal passages Oral: Lips and gums without lesions. Tongue and palate mucosa without lesions. Posterior oropharynx clear. Neck: No palpable adenopathy or masses Respiratory: Breathing comfortably  Skin: No facial/neck lesions or rash noted.  Cerumen impaction removal  Date/Time: 06/10/2020 2:19 PM Performed by: Rozetta Nunnery, MD Authorized by: Rozetta Nunnery, MD   Consent:    Consent obtained:  Verbal   Consent given by:  Patient   Risks  discussed:  Pain and bleeding Procedure details:    Location:  L ear and R ear   Procedure type: curette   Post-procedure details:    Inspection:  TM intact and canal normal   Hearing quality:  Improved   Patient tolerance of procedure:  Tolerated well, no immediate complications Comments:     Minimal wax buildup in both ear canals that was cleaned with curettes.  TMs were clear bilaterally.  She had a few small fungal elements on the wax but no signs of external otitis. No foreign body within the ear canal.    Assessment: Minimal wax buildup in both ear canals that was cleaned in the office.  Plan: Recommended cleaning the ears with alcohol vinegar once a month to help reduce recurrent external otitis which she has had in the past.. Cautioned her about not using Q-tips in the ear.   Radene Journey, MD

## 2020-06-11 NOTE — Progress Notes (Signed)
Pharmacist Chemotherapy Monitoring - Initial Assessment    Anticipated start date: 06/18/20   Regimen:  . Are orders appropriate based on the patient's diagnosis, regimen, and cycle? Yes . Does the plan date match the patient's scheduled date? Yes . Is the sequencing of drugs appropriate? Yes . Are the premedications appropriate for the patient's regimen? Yes . Prior Authorization for treatment is: Approved o If applicable, is the correct biosimilar selected based on the patient's insurance? yes  Organ Function and Labs: Marland Kitchen Are dose adjustments needed based on the patient's renal function, hepatic function, or hematologic function? No . Are appropriate labs ordered prior to the start of patient's treatment? Yes . Other organ system assessment, if indicated: anthracyclines: Echo/ MUGA . The following baseline labs, if indicated, have been ordered: N/A  Dose Assessment: . Are the drug doses appropriate? Yes . Are the following correct: o Drug concentrations Yes o IV fluid compatible with drug Yes o Administration routes Yes o Timing of therapy Yes . If applicable, does the patient have documented access for treatment and/or plans for port-a-cath placement? yes . If applicable, have lifetime cumulative doses been properly documented and assessed? yes Lifetime Dose Tracking  No doses have been documented on this patient for the following tracked chemicals: Doxorubicin, Epirubicin, Idarubicin, Daunorubicin, Mitoxantrone, Bleomycin, Oxaliplatin, Carboplatin, Liposomal Doxorubicin  o   Toxicity Monitoring/Prevention: . The patient has the following take home antiemetics prescribed: Prochlorperazine and Dexamethasone . The patient has the following take home medications prescribed: N/A . Medication allergies and previous infusion related reactions, if applicable, have been reviewed and addressed. Yes . The patient's current medication list has been assessed for drug-drug interactions with  their chemotherapy regimen. no significant drug-drug interactions were identified on review.  Order Review: . Are the treatment plan orders signed? Yes . Is the patient scheduled to see a provider prior to their treatment? Yes  I verify that I have reviewed each item in the above checklist and answered each question accordingly.   Kennith Center, Pharm.D., CPP 06/11/2020@1 :15 PM

## 2020-06-17 NOTE — Progress Notes (Signed)
[address]  Telephone:(336) [phone] [phone]) [phone]     ID: Yolanda [name] DOB: [birth year]  MR#: [id number]  PJK#:[id number]  Patient Care Team: Mosie Lukes, MD as PCP - General (Family Medicine) Michel Bickers, MD as Consulting Physician (Infectious Diseases) Megan Salon, MD as Consulting Physician (Gynecology) Jaydi Bray, Virgie Dad, MD as Consulting Physician (Oncology) Juanita Craver, MD as Consulting Physician (Gastroenterology) Stark Klein, MD as Consulting Physician (General Surgery) Mauro Kaufmann, RN as Oncology Nurse Navigator Rockwell Germany, RN as Oncology Nurse Navigator Chauncey Cruel, MD OTHER MD:   CHIEF COMPLAINT: Estrogen receptor positive breast cancer  CURRENT TREATMENT: Adjuvant chemotherapy   INTERVAL HISTORY: Yolanda [name] returns today for follow up of her estrogen receptor positive breast cancer.  She is accompanied by her husband Yolanda [name]  She is scheduled to begin adjuvant chemotherapy today, to consist of cyclophosphamide and doxorubicin in dose dense fashion x4, today. She will receive pegfilgrastim on day 3. This will be followed by paclitaxel weekly x12.  Recall she had a port placed at the time of lumpectomy 05/21/2020  REVIEW OF SYSTEMS: Charisa took a walk this morning starting at 5:30 AM for 30 minutes which is great.  She also exercises by stretching.  She is more concerned about her family then she is about herself and is making sure that she presents a very positive self-image and a lot of confidence so they will not worry.  Aside from these issues a detailed review of systems today was benign   COVID 19 VACCINATION STATUS: fully vaccinated [employer]), with booster 12/2019   HISTORY OF CURRENT ILLNESS: From the original intake note:   I saw [name] in December 2018 for evaluation of atypical lobular hyperplasia.  We discussed various strategies regarding breast cancer prevention at that time for her to consider.     More recently (around the time of the Super Bowl 2022) she found a lump in her right breast.  She underwent bilateral diagnostic mammography with tomography and right breast ultrasonography at [facility] on 04/10/2020 showing: breast density category C; palpable 3 cm mass in right breast at 12 o'clock; no enlarged adenopathy in right axilla.  Accordingly on the same day, she proceeded to biopsy of the right breast area in question. The pathology from this procedure ([id number]) showed: invasive ductal carcinoma, grade 3; ductal carcinoma in situ. Prognostic indicators significant for: estrogen receptor, 90% positive with moderate staining intensity and progesterone receptor, 0% negative. Proliferation marker Ki67 at 25%. HER2 equivocal by immunohistochemistry (2+), but negative by fluorescent in situ hybridization with a signals ratio 1.49 and number per cell 2.75.  Cancer Staging Malignant neoplasm of upper-outer quadrant of right breast in female, estrogen receptor positive ([place]) Staging form: Breast, AJCC 8th Edition - Clinical: Stage IIA (cT2, cN0, cM0, G3, ER+, PR+, HER2-) - Signed by Chauncey Cruel, MD on 04/18/2020  The patient's subsequent history is as detailed below.   PAST MEDICAL HISTORY: Past Medical History:  Diagnosis Date  . Asthma    with respiratory infection  . Cancer [facility])    breast cancer - right  . Diabetes mellitus without complication ([place])    type II  . Fatty liver   . Fibroid uterus   . Hypertension    Patient reports that she has never been diagnosied with HTN, "I take HCTZ for swelling in my feet, if needed."  . Osteoarthritis 03/10/2015  . PONV (postoperative nausea and vomiting)    2014  .  Refusal of blood transfusions as patient is Jehovah's Witness    NO BLOOD PRODUCTS  . Sleep apnea     PAST SURGICAL HISTORY: Past Surgical History:  Procedure Laterality Date  . BREAST CYST ASPIRATION    . BREAST EXCISIONAL BIOPSY    . BREAST LUMPECTOMY  WITH SENTINEL LYMPH NODE BIOPSY Right 05/21/2020   Procedure: RIGHT BREAST LUMPECTOMY WITH SENTINEL LYMPH NODE BIOPSY;  Surgeon: Stark Klein, MD;  Location: [place];  Service: General;  Laterality: Right;  . CHOLECYSTECTOMY  1/11  . COLONOSCOPY    . DILATATION & CURRETTAGE/HYSTEROSCOPY WITH RESECTOCOPE N/A 03/13/2014   Procedure: DILATATION & CURETTAGE/HYSTEROSCOPY ;  Surgeon: Lyman Speller, MD;  Location: [facility];  Service: Gynecology;  Laterality: N/A;  . PORTACATH PLACEMENT Left 05/21/2020   Procedure: INSERTION PORT-A-CATH;  Surgeon: Stark Klein, MD;  Location: [place];  Service: General;  Laterality: Left;  . RADIOACTIVE SEED GUIDED EXCISIONAL BREAST BIOPSY Left 12/30/2016   Procedure: RADIOACTIVE SEED GUIDED EXCISIONAL BREAST BIOPSY;  Surgeon: Stark Klein, MD;  Location: [place];  Service: General;  Laterality: Left;    FAMILY HISTORY: Family History  Problem Relation Age of Onset  . Asthma Mother   . Diabetes Mother   . Diabetes Father   . Heart attack Father   . Diabetes Brother   . Diabetes Brother   . Asthma Brother   . Heart Problems Sister   . Asthma Sister   . Hypertension Sister   . Heart disease Maternal Grandfather   . Osteopenia Sister   The patient's father died from heart disease at the age of 72.  The patient's mother is 72 years old as of March 2022.  The patient has 4 brothers and 2 sisters.  She is not aware of any history of cancer in her family.   GYNECOLOGIC HISTORY:  Patient's last menstrual period was 02/21/2014. Menarche: 60 years old GX P 0 LMP age 1 Contraceptive 1 year, no complications HRT no  Hysterectomy? no BSO? 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  Her husband Yolanda [name] works as a Marketing executive for [employer].  He has 2 children from a prior marriage, both living in [place].  One is traveling nurse.  The other 1 is a Administrator.  The patient is a Restaurant manager, fast food.    ADVANCED  DIRECTIVES: In the absence of any documentation to the contrary, the patient's spouse is their HCPOA.  The patient made it clear at her visit 04/18/2020 that she would not accept any blood transfusion or blood products for any reason including to stave off death   HEALTH MAINTENANCE: Social History   Tobacco Use  . Smoking status: Never Smoker  . Smokeless tobacco: Never Used  Vaping Use  . Vaping Use: Never used  Substance Use Topics  . Alcohol use: No  . Drug use: No     Colonoscopy: 10/2019 (Dr. Collene Mares), recall 2026  PAP: 03/2017, negative  Bone density: 09/2018, -0.8   Allergies  Allergen Reactions  . Lisinopril Shortness Of Breath and Cough    wheezing    Current Outpatient Medications  Medication Sig Dispense Refill  . albuterol (PROVENTIL HFA;VENTOLIN HFA) 108 (90 Base) MCG/ACT inhaler Inhale 1-2 puffs into the lungs every 4 (four) hours as needed for wheezing or shortness of breath. 18 g 2  . atorvastatin (LIPITOR) 10 MG tablet Take 10 mg by mouth every other day.    . b complex  vitamins capsule Take 1 capsule by mouth every other day.    . B-D ULTRA-FINE 33 LANCETS MISC Check BG 2 times/day    . Carboxymethylcellul-Glycerin (REFRESH OPTIVE OP) Place 1 drop into both eyes 3 (three) times daily as needed (dry / irritated eyes).    . cholecalciferol (VITAMIN D3) 25 MCG (1000 UNIT) tablet Take 1,000 Units by mouth every other day.    Marland Kitchen dexamethasone (DECADRON) 4 MG tablet Take 1 tablet (4 mg total) by mouth 2 (two) times daily. Take daily for 3 days after chemo. Take with food. 30 tablet 1  . fluticasone (FLONASE) 50 MCG/ACT nasal spray Place 1 spray into both nostrils daily as needed for allergies or rhinitis.    Marland Kitchen glucose blood test strip Check BG 2 times/day.  Dx E11.65    . hydrochlorothiazide (HYDRODIURIL) 25 MG tablet Take 25 mg by mouth every other day.    . hydrocortisone cream 1 % Apply 1 application topically daily as needed for itching.    . lidocaine-prilocaine  (EMLA) cream Apply to affected area once 30 g 3  . loratadine (CLARITIN) 10 MG tablet Take 1 tablet (10 mg total) by mouth daily. 90 tablet 0  . magnesium oxide (MAG-OX) 400 MG tablet Take 400 mg by mouth every other day.    . metFORMIN (GLUCOPHAGE-XR) 500 MG 24 hr tablet Take 1,000 mg by mouth 2 (two) times daily.    . milk thistle 175 MG tablet Take 175 mg by mouth every other day.    . Omega-3 Fatty Acids (OMEGA 3 PO) Take 1 capsule by mouth every other day.    Marland Kitchen omeprazole (PRILOSEC) 40 MG capsule Take 40 mg by mouth daily as needed (acid reflux).    Marland Kitchen oxyCODONE (OXY IR/ROXICODONE) 5 MG immediate release tablet Take 1 tablet (5 mg total) by mouth every 6 (six) hours as needed for severe pain. 10 tablet 0  . prochlorperazine (COMPAZINE) 10 MG tablet Take 1 tablet (10 mg total) by mouth every 6 (six) hours as needed (Nausea or vomiting). 30 tablet 1  . SOYA LECITHIN PO Take 1 capsule by mouth every other day.    . vitamin C (ASCORBIC ACID) 500 MG tablet Take 500 mg by mouth every other day.     No current facility-administered medications for this visit.   SUPPLEMENTS: Aside from the medications listed above, the patient takes a teaspoon of olive oil daily, 1000 mg of vitamin C every other day, sunflower lecithin 600 mg every other day, vitamin D3 5000 mg every other day, magnesium 200 mg every other day, milk thistle 150 mg every other day, a B complex vitamin (B6 and B12) every other day, as well as Osteo Bi-Flex weekly and fluticasone as needed  OBJECTIVE: Middle-aged woman in no acute distress  Vitals:   06/18/20 0831  BP: (!) 141/80  Pulse: (!) 103  Resp: 18  Temp: (!) 97.5 F (36.4 C)  SpO2: 98%     Body mass index is 28.83 kg/m.   Wt Readings from Last 3 Encounters:  06/18/20 184 lb 1.6 oz (83.5 kg)  06/03/20 185 lb 1.6 oz (84 kg)  05/21/20 187 lb (84.8 kg)      ECOG FS:1 - Symptomatic but completely ambulatory  Sclerae unicteric, EOMs intact Wearing a mask No cervical  or supraclavicular adenopathy Lungs no rales or rhonchi Heart regular rate and rhythm Abd soft, nontender, positive bowel sounds MSK no focal spinal tenderness, no upper extremity lymphedema Neuro: nonfocal, well  oriented, appropriate affect Breasts: Deferred  LAB RESULTS:  CMP     Component Value Date/Time   NA 134 (L) 05/21/2020 0823   NA 138 11/04/2017 0000   K 4.0 05/21/2020 0823   CL 98 05/21/2020 0823   CO2 27 05/21/2020 0823   GLUCOSE 115 (H) 05/21/2020 0823   BUN 10 05/21/2020 0823   BUN 14 11/04/2017 0000   CREATININE 0.57 05/21/2020 0823   CREATININE 0.81 04/18/2020 1527   CREATININE 0.60 09/07/2014 0950   CALCIUM 10.2 05/21/2020 0823   PROT 6.8 05/21/2020 0823   ALBUMIN 3.7 05/21/2020 0823   AST 27 05/21/2020 0823   AST 26 04/18/2020 1527   ALT 32 05/21/2020 0823   ALT 37 04/18/2020 1527   ALKPHOS 105 05/21/2020 0823   BILITOT 0.6 05/21/2020 0823   BILITOT 0.3 04/18/2020 1527   GFRNONAA >60 05/21/2020 0823   GFRNONAA >60 04/18/2020 1527   GFRAA >60 05/15/2019 1803    No results found for: TOTALPROTELP, ALBUMINELP, A1GS, A2GS, BETS, BETA2SER, GAMS, MSPIKE, SPEI  Lab Results  Component Value Date   WBC 7.9 06/18/2020   NEUTROABS 4.1 06/18/2020   HGB 10.9 (L) 06/18/2020   HCT 34.9 (L) 06/18/2020   MCV 93.1 06/18/2020   PLT 307 06/18/2020    No results found for: LABCA2  No components found for: [id number]  No results for input(s): INR in the last 168 hours.  No results found for: LABCA2  No results found for: LMB867  No results found for: JQG920  No results found for: FEO712  No results found for: CA2729  No components found for: HGQUANT  No results found for: CEA1 / No results found for: CEA1   No results found for: AFPTUMOR  No results found for: CHROMOGRNA  No results found for: KPAFRELGTCHN, LAMBDASER, KAPLAMBRATIO (kappa/lambda light chains)  No results found for: HGBA, HGBA2QUANT, HGBFQUANT, HGBSQUAN (Hemoglobinopathy  evaluation)   No results found for: LDH  Lab Results  Component Value Date   IRON 38 (L) 10/16/2009   IRONPCTSAT 10.2 (L) 10/16/2009   (Iron and TIBC)  Lab Results  Component Value Date   FERRITIN 8.9 (L) 10/16/2009    Urinalysis    Component Value Date/Time   COLORURINE YELLOW 03/11/2009 1100   APPEARANCEUR CLEAR 03/11/2009 1100   LABSPEC 1.017 03/11/2009 1100   PHURINE 7.0 03/11/2009 1100   GLUCOSEU NEGATIVE 03/11/2009 1100   HGBUR LARGE (A) 03/11/2009 1100   BILIRUBINUR N 05/21/2015 1614   KETONESUR NEGATIVE 03/11/2009 1100   PROTEINUR N 05/21/2015 1614   PROTEINUR NEGATIVE 03/11/2009 1100   UROBILINOGEN negative 05/21/2015 1614   UROBILINOGEN 0.2 03/11/2009 1100   NITRITE N 05/21/2015 1614   NITRITE NEGATIVE 03/11/2009 1100   LEUKOCYTESUR Negative 05/21/2015 1614    STUDIES: NM Sentinel Node Inj-No Rpt (Breast)  Result Date: 05/21/2020 Sulfur colloid was injected by the nuclear medicine technologist for melanoma sentinel node.   DG CHEST PORT 1 VIEW  Result Date: 05/21/2020 CLINICAL DATA:  Port-A-Cath placement EXAM: PORTABLE CHEST 1 VIEW COMPARISON:  May 15, 2019 FINDINGS: Port-A-Cath tip is at the cavoatrial junction. No pneumothorax. There is no edema or airspace opacity. Heart is mildly enlarged with pulmonary vascularity normal. No adenopathy. Surgical clips overlie each breast region. IMPRESSION: Port-A-Cath tip at cavoatrial junction. No pneumothorax. No edema or airspace opacity. Stable cardiac prominence. Electronically Signed   By: Lowella Grip III M.D.   On: 05/21/2020 12:53   DG Fluoro Guide CV Line-No Report  Result Date:  05/21/2020 Fluoroscopy was utilized by the requesting physician.  No radiographic interpretation.     ELIGIBLE FOR AVAILABLE RESEARCH PROTOCOL: no  ASSESSMENT: 60 y.o. [place] woman status post right breast upper outer quadrant biopsy 04/10/2020 for a clinical T2N0, stage IIA invasive ductal carcinoma, grade 3, estrogen  receptor positive, progesterone receptor and HER-2 negative, with an MIB-1 of 25%.  (1) Oncotype obtained from the original biopsy shows a score of 48, predicting a recurrence rate outside the breast within 9 years of 28% if the only systemic therapy is antiestrogen for 5 years.  It also predicts a greater than 15% benefit from chemotherapy.  (2) right lumpectomy and sentinel lymph node dissection 05/21/2020 shows a pT2 pN0, stage IIB invasive ductal carcinoma, grade 3, with negative margins.  (3) adjuvant chemotherapy to consist of cyclophosphamide and doxorubicin in dose dense fashion x4 starting 06/18/2020, with Neulasta day 3; to be followed by paclitaxel weekly x12.  (a) echo 05/13/2020 shows an ejection fraction in the 55-60% range  (4) adjuvant radiation  (5) antiestrogens.   PLAN: Calista is ready to start treatment today.  She is following all the instructions, prepped her port appropriately, and knows how to read her routing sheet so she will be able to take her antinausea and other supportive medicines appropriately.  She has all the medicines on hand as well.  She understands that after her PEG filgrastim shot on Thursday she may have significant pain in her bones and she will take Tylenol and Aleve if that happens.  She will also start Claritin to try to prevent that and she is welcome to continue the Claritin as needed as she is also having some allergy issues.  I commended her exercise program.  I asked her to keep a diary of symptoms so when she returns to see me in 1week we can troubleshoot any problems so that she will prevent them with the next 3 cycles  I strongly encouraged her to call us with any questions whatsoever before her next visit  Total encounter time 25 minutes.   Virgie Dad. Leslieann Whisman, MD 06/18/2020 8:36 AM Medical Oncology and Hematology [facility] [place], [place] [address] Tel. [phone]    Fax. [phone]   This document  serves as a record of services personally performed by Lurline Del, MD. It was created on his behalf by Wilburn Mylar, a trained medical scribe. The creation of this record is based on the scribe's personal observations and the provider's statements to them.   I, Lurline Del MD, have reviewed the above documentation for accuracy and completeness, and I agree with the above.   *Total Encounter Time as defined by the Centers for Medicare and Medicaid Services includes, in addition to the face-to-face time of a patient visit (documented in the note above) non-face-to-face time: obtaining and reviewing outside history, ordering and reviewing medications, tests or procedures, care coordination (communications with other health care professionals or caregivers) and documentation in the medical record.

## 2020-06-18 ENCOUNTER — Encounter: Payer: Self-pay | Admitting: *Deleted

## 2020-06-18 ENCOUNTER — Inpatient Hospital Stay (HOSPITAL_BASED_OUTPATIENT_CLINIC_OR_DEPARTMENT_OTHER): Payer: 59 | Admitting: Oncology

## 2020-06-18 ENCOUNTER — Inpatient Hospital Stay: Payer: 59 | Attending: Oncology

## 2020-06-18 ENCOUNTER — Other Ambulatory Visit: Payer: Self-pay

## 2020-06-18 ENCOUNTER — Inpatient Hospital Stay: Payer: 59

## 2020-06-18 VITALS — BP 141/80 | HR 103 | Temp 97.5°F | Resp 18 | Ht 67.0 in | Wt 184.1 lb

## 2020-06-18 DIAGNOSIS — Z5189 Encounter for other specified aftercare: Secondary | ICD-10-CM | POA: Diagnosis not present

## 2020-06-18 DIAGNOSIS — Z170 Estrogen receptor positive status [ER+]: Secondary | ICD-10-CM

## 2020-06-18 DIAGNOSIS — C50411 Malignant neoplasm of upper-outer quadrant of right female breast: Secondary | ICD-10-CM

## 2020-06-18 DIAGNOSIS — Z452 Encounter for adjustment and management of vascular access device: Secondary | ICD-10-CM | POA: Diagnosis not present

## 2020-06-18 DIAGNOSIS — Z95828 Presence of other vascular implants and grafts: Secondary | ICD-10-CM

## 2020-06-18 DIAGNOSIS — Z5111 Encounter for antineoplastic chemotherapy: Secondary | ICD-10-CM | POA: Insufficient documentation

## 2020-06-18 LAB — COMPREHENSIVE METABOLIC PANEL
ALT: 23 U/L (ref 0–44)
AST: 23 U/L (ref 15–41)
Albumin: 3.7 g/dL (ref 3.5–5.0)
Alkaline Phosphatase: 143 U/L — ABNORMAL HIGH (ref 38–126)
Anion gap: 12 (ref 5–15)
BUN: 10 mg/dL (ref 8–23)
CO2: 26 mmol/L (ref 22–32)
Calcium: 10 mg/dL (ref 8.9–10.3)
Chloride: 101 mmol/L (ref 98–111)
Creatinine, Ser: 0.73 mg/dL (ref 0.44–1.00)
GFR, Estimated: >60 mL/min (ref >60)
Glucose, Bld: 160 mg/dL — ABNORMAL HIGH (ref 70–99)
Potassium: 4.2 mmol/L (ref 3.5–5.1)
Sodium: 139 mmol/L (ref 135–145)
Total Bilirubin: 0.3 mg/dL (ref 0.3–1.2)
Total Protein: 7.1 g/dL (ref 6.5–8.1)

## 2020-06-18 LAB — CBC WITH DIFFERENTIAL/PLATELET
Abs Immature Granulocytes: 0.02 10*3/uL (ref 0.00–0.07)
Basophils Absolute: 0.1 10*3/uL (ref 0.0–0.1)
Basophils Relative: 1 %
Eosinophils Absolute: 0.5 10*3/uL (ref 0.0–0.5)
Eosinophils Relative: 6 %
HCT: 34.9 % — ABNORMAL LOW (ref 36.0–46.0)
Hemoglobin: 10.9 g/dL — ABNORMAL LOW (ref 12.0–15.0)
Immature Granulocytes: 0 %
Lymphocytes Relative: 34 %
Lymphs Abs: 2.7 10*3/uL (ref 0.7–4.0)
MCH: 29.1 pg (ref 26.0–34.0)
MCHC: 31.2 g/dL (ref 30.0–36.0)
MCV: 93.1 fL (ref 80.0–100.0)
Monocytes Absolute: 0.6 10*3/uL (ref 0.1–1.0)
Monocytes Relative: 8 %
Neutro Abs: 4.1 10*3/uL (ref 1.7–7.7)
Neutrophils Relative %: 51 %
Platelets: 307 10*3/uL (ref 150–400)
RBC: 3.75 MIL/uL — ABNORMAL LOW (ref 3.87–5.11)
RDW: 14.1 % (ref 11.5–15.5)
WBC: 7.9 10*3/uL (ref 4.0–10.5)
nRBC: 0 % (ref 0.0–0.2)

## 2020-06-18 MED ORDER — HEPARIN SOD (PORK) LOCK FLUSH 100 UNIT/ML IV SOLN
500.0000 [IU] | Freq: Once | INTRAVENOUS | Status: AC | PRN
  Administered 2020-06-18: 500 [IU]
  Filled 2020-06-18: qty 5

## 2020-06-18 MED ORDER — SODIUM CHLORIDE 0.9% FLUSH
10.0000 mL | INTRAVENOUS | Status: DC | PRN
  Administered 2020-06-18: 10 mL
  Filled 2020-06-18: qty 10

## 2020-06-18 MED ORDER — PALONOSETRON HCL INJECTION 0.25 MG/5ML
0.2500 mg | Freq: Once | INTRAVENOUS | Status: AC
  Administered 2020-06-18: 0.25 mg via INTRAVENOUS

## 2020-06-18 MED ORDER — SODIUM CHLORIDE 0.9 % IV SOLN
150.0000 mg | Freq: Once | INTRAVENOUS | Status: AC
  Administered 2020-06-18: 150 mg via INTRAVENOUS
  Filled 2020-06-18: qty 150

## 2020-06-18 MED ORDER — SODIUM CHLORIDE 0.9 % IV SOLN
600.0000 mg/m2 | Freq: Once | INTRAVENOUS | Status: AC
  Administered 2020-06-18: 1200 mg via INTRAVENOUS
  Filled 2020-06-18: qty 60

## 2020-06-18 MED ORDER — PALONOSETRON HCL INJECTION 0.25 MG/5ML
INTRAVENOUS | Status: AC
  Filled 2020-06-18: qty 5

## 2020-06-18 MED ORDER — SODIUM CHLORIDE 0.9 % IV SOLN
10.0000 mg | Freq: Once | INTRAVENOUS | Status: AC
  Administered 2020-06-18: 10 mg via INTRAVENOUS
  Filled 2020-06-18: qty 10

## 2020-06-18 MED ORDER — SODIUM CHLORIDE 0.9 % IV SOLN
Freq: Once | INTRAVENOUS | Status: AC
Start: 2020-06-18 — End: 2020-06-18
  Filled 2020-06-18: qty 250

## 2020-06-18 MED ORDER — DOXORUBICIN HCL CHEMO IV INJECTION 2 MG/ML
60.0000 mg/m2 | Freq: Once | INTRAVENOUS | Status: AC
  Administered 2020-06-18: 120 mg via INTRAVENOUS
  Filled 2020-06-18: qty 60

## 2020-06-18 NOTE — Patient Instructions (Addendum)
[facility]  Discharge Instructions: Thank you for choosing [facility] to provide your oncology and hematology care.   If you have a lab appointment with the [facility], please go directly to the [facility] and check in at the registration area.   Wear comfortable clothing and clothing appropriate for easy access to any Portacath or PICC line.   We strive to give you quality time with your provider. You may need to reschedule your appointment if you arrive late (15 or more minutes).  Arriving late affects you and other patients whose appointments are after yours.  Also, if you miss three or more appointments without notifying the office, you may be dismissed from the clinic at the provider's discretion.      For prescription refill requests, have your pharmacy contact our office and allow 72 hours for refills to be completed.    Today you received the following chemotherapy and/or immunotherapy agents Adrimycin, Cytoxan   To help prevent nausea and vomiting after your treatment, we encourage you to take your nausea medication as directed.  BELOW ARE SYMPTOMS THAT SHOULD BE REPORTED IMMEDIATELY: . *FEVER GREATER THAN 100.4 F (38 C) OR HIGHER . *CHILLS OR SWEATING . *NAUSEA AND VOMITING THAT IS NOT CONTROLLED WITH YOUR NAUSEA MEDICATION . *UNUSUAL SHORTNESS OF BREATH . *UNUSUAL BRUISING OR BLEEDING . *URINARY PROBLEMS (pain or burning when urinating, or frequent urination) . *BOWEL PROBLEMS (unusual diarrhea, constipation, pain near the anus) . TENDERNESS IN MOUTH AND THROAT WITH OR WITHOUT PRESENCE OF ULCERS (sore throat, sores in mouth, or a toothache) . UNUSUAL RASH, SWELLING OR PAIN  . UNUSUAL VAGINAL DISCHARGE OR ITCHING   Items with * indicate a potential emergency and should be followed up as soon as possible or go to the Emergency Department if any problems should occur.  Please show the CHEMOTHERAPY ALERT CARD or IMMUNOTHERAPY  ALERT CARD at check-in to the Emergency Department and triage nurse.  Should you have questions after your visit or need to cancel or reschedule your appointment, please contact [facility]  Dept: [phone]  and follow the prompts.  Office hours are 8:00 a.m. to 4:30 p.m. Monday - Friday. Please note that voicemails left after 4:00 p.m. may not be returned until the following business day.  We are closed weekends and major holidays. You have access to a nurse at all times for urgent questions. Please call the main number to the clinic Dept: [phone] and follow the prompts.   For any non-urgent questions, you may also contact your provider using MyChart. We now offer e-Visits for anyone 78 and older to request care online for non-urgent symptoms. For details visit [email or web address].   Also download the MyChart app! Go to the app store, search "MyChart", open the app, select [facility], and log in with your MyChart username and password.  Due to Covid, a mask is required upon entering the hospital/clinic. If you do not have a mask, one will be given to you upon arrival. For doctor visits, patients may have 1 support person aged 68 or older with them. For treatment visits, patients cannot have anyone with them due to current Covid guidelines and our immunocompromised population.    Cyclophosphamide Injection What is this medicine? CYCLOPHOSPHAMIDE (sye kloe FOSS fa mide) is a chemotherapy drug. It slows the growth of cancer cells. This medicine is used to treat many types of cancer like lymphoma, myeloma, leukemia, breast cancer, and  ovarian cancer, to name a few. This medicine may be used for other purposes; ask your health care provider or pharmacist if you have questions. COMMON BRAND NAME(S): Cytoxan, Neosar What should I tell my health care provider before I take this medicine? They need to know if you have any of these conditions:  heart  disease  history of irregular heartbeat  infection  kidney disease  liver disease  low blood counts, like white cells, platelets, or red blood cells  on hemodialysis  recent or ongoing radiation therapy  scarring or thickening of the lungs  trouble passing urine  an unusual or allergic reaction to cyclophosphamide, other medicines, foods, dyes, or preservatives  pregnant or trying to get pregnant  breast-feeding How should I use this medicine? This drug is usually given as an injection into a vein or muscle or by infusion into a vein. It is administered in a hospital or clinic by a specially trained health care professional. Talk to your pediatrician regarding the use of this medicine in children. Special care may be needed. Overdosage: If you think you have taken too much of this medicine contact a poison control center or emergency room at once. NOTE: This medicine is only for you. Do not share this medicine with others. What if I miss a dose? It is important not to miss your dose. Call your doctor or health care professional if you are unable to keep an appointment. What may interact with this medicine?  amphotericin B  azathioprine  certain antivirals for HIV or hepatitis  certain medicines for blood pressure, heart disease, irregular heart beat  certain medicines that treat or prevent blood clots like warfarin  certain other medicines for cancer  cyclosporine  etanercept  indomethacin  medicines that relax muscles for surgery  medicines to increase blood counts  metronidazole This list may not describe all possible interactions. Give your health care provider a list of all the medicines, herbs, non-prescription drugs, or dietary supplements you use. Also tell them if you smoke, drink alcohol, or use illegal drugs. Some items may interact with your medicine. What should I watch for while using this medicine? Your condition will be monitored carefully  while you are receiving this medicine. You may need blood work done while you are taking this medicine. Drink water or other fluids as directed. Urinate often, even at night. Some products may contain alcohol. Ask your health care professional if this medicine contains alcohol. Be sure to tell all health care professionals you are taking this medicine. Certain medicines, like metronidazole and disulfiram, can cause an unpleasant reaction when taken with alcohol. The reaction includes flushing, headache, nausea, vomiting, sweating, and increased thirst. The reaction can last from 30 minutes to several hours. Do not become pregnant while taking this medicine or for 1 year after stopping it. Women should inform their health care professional if they wish to become pregnant or think they might be pregnant. Men should not father a child while taking this medicine and for 4 months after stopping it. There is potential for serious side effects to an unborn child. Talk to your health care professional for more information. Do not breast-feed an infant while taking this medicine or for 1 week after stopping it. This medicine has caused ovarian failure in some women. This medicine may make it more difficult to get pregnant. Talk to your health care professional if you are concerned about your fertility. This medicine has caused decreased sperm counts in some men.  This may make it more difficult to father a child. Talk to your health care professional if you are concerned about your fertility. Call your health care professional for advice if you get a fever, chills, or sore throat, or other symptoms of a cold or flu. Do not treat yourself. This medicine decreases your body's ability to fight infections. Try to avoid being around people who are sick. Avoid taking medicines that contain aspirin, acetaminophen, ibuprofen, naproxen, or ketoprofen unless instructed by your health care professional. These medicines may hide  a fever. Talk to your health care professional about your risk of cancer. You may be more at risk for certain types of cancer if you take this medicine. If you are going to need surgery or other procedure, tell your health care professional that you are using this medicine. Be careful brushing or flossing your teeth or using a toothpick because you may get an infection or bleed more easily. If you have any dental work done, tell your dentist you are receiving this medicine. What side effects may I notice from receiving this medicine? Side effects that you should report to your doctor or health care professional as soon as possible:  allergic reactions like skin rash, itching or hives, swelling of the face, lips, or tongue  breathing problems  nausea, vomiting  signs and symptoms of bleeding such as bloody or black, tarry stools; red or dark brown urine; spitting up blood or brown material that looks like coffee grounds; red spots on the skin; unusual bruising or bleeding from the eyes, gums, or nose  signs and symptoms of heart failure like fast, irregular heartbeat, sudden weight gain; swelling of the ankles, feet, hands  signs and symptoms of infection like fever; chills; cough; sore throat; pain or trouble passing urine  signs and symptoms of kidney injury like trouble passing urine or change in the amount of urine  signs and symptoms of liver injury like dark yellow or brown urine; general ill feeling or flu-like symptoms; light-colored stools; loss of appetite; nausea; right upper belly pain; unusually weak or tired; yellowing of the eyes or skin Side effects that usually do not require medical attention (report to your doctor or health care professional if they continue or are bothersome):  confusion  decreased hearing  diarrhea  facial flushing  hair loss  headache  loss of appetite  missed menstrual periods  signs and symptoms of low red blood cells or anemia such as  unusually weak or tired; feeling faint or lightheaded; falls  skin discoloration This list may not describe all possible side effects. Call your doctor for medical advice about side effects. You may report side effects to FDA at 1-800-FDA-1088. Where should I keep my medicine? This drug is given in a hospital or clinic and will not be stored at home. NOTE: This sheet is a summary. It may not cover all possible information. If you have questions about this medicine, talk to your doctor, pharmacist, or health care provider.  2021 Elsevier/Gold Standard (2018-11-07 09:53:29)  Doxorubicin injection What is this medicine? DOXORUBICIN (dox oh ROO bi sin) is a chemotherapy drug. It is used to treat many kinds of cancer like leukemia, lymphoma, neuroblastoma, sarcoma, and Wilms' tumor. It is also used to treat bladder cancer, breast cancer, lung cancer, ovarian cancer, stomach cancer, and thyroid cancer. This medicine may be used for other purposes; ask your health care provider or pharmacist if you have questions. COMMON BRAND NAME(S): Adriamycin, Adriamycin PFS, Adriamycin  RDF, Rubex What should I tell my health care provider before I take this medicine? They need to know if you have any of these conditions:  heart disease  history of low blood counts caused by a medicine  liver disease  recent or ongoing radiation therapy  an unusual or allergic reaction to doxorubicin, other chemotherapy agents, other medicines, foods, dyes, or preservatives  pregnant or trying to get pregnant  breast-feeding How should I use this medicine? This drug is given as an infusion into a vein. It is administered in a hospital or clinic by a specially trained health care professional. If you have pain, swelling, burning or any unusual feeling around the site of your injection, tell your health care professional right away. Talk to your pediatrician regarding the use of this medicine in children. Special care may  be needed. Overdosage: If you think you have taken too much of this medicine contact a poison control center or emergency room at once. NOTE: This medicine is only for you. Do not share this medicine with others. What if I miss a dose? It is important not to miss your dose. Call your doctor or health care professional if you are unable to keep an appointment. What may interact with this medicine? This medicine may interact with the following medications:  6-mercaptopurine  paclitaxel  phenytoin  St. John's Wort  trastuzumab  verapamil This list may not describe all possible interactions. Give your health care provider a list of all the medicines, herbs, non-prescription drugs, or dietary supplements you use. Also tell them if you smoke, drink alcohol, or use illegal drugs. Some items may interact with your medicine. What should I watch for while using this medicine? This drug may make you feel generally unwell. This is not uncommon, as chemotherapy can affect healthy cells as well as cancer cells. Report any side effects. Continue your course of treatment even though you feel ill unless your doctor tells you to stop. There is a maximum amount of this medicine you should receive throughout your life. The amount depends on the medical condition being treated and your overall health. Your doctor will watch how much of this medicine you receive in your lifetime. Tell your doctor if you have taken this medicine before. You may need blood work done while you are taking this medicine. Your urine may turn red for a few days after your dose. This is not blood. If your urine is dark or brown, call your doctor. In some cases, you may be given additional medicines to help with side effects. Follow all directions for their use. Call your doctor or health care professional for advice if you get a fever, chills or sore throat, or other symptoms of a cold or flu. Do not treat yourself. This drug decreases  your body's ability to fight infections. Try to avoid being around people who are sick. This medicine may increase your risk to bruise or bleed. Call your doctor or health care professional if you notice any unusual bleeding. Talk to your doctor about your risk of cancer. You may be more at risk for certain types of cancers if you take this medicine. Do not become pregnant while taking this medicine or for 6 months after stopping it. Women should inform their doctor if they wish to become pregnant or think they might be pregnant. Men should not father a child while taking this medicine and for 6 months after stopping it. There is a potential for serious side  effects to an unborn child. Talk to your health care professional or pharmacist for more information. Do not breast-feed an infant while taking this medicine. This medicine has caused ovarian failure in some women and reduced sperm counts in some men This medicine may interfere with the ability to have a child. Talk with your doctor or health care professional if you are concerned about your fertility. This medicine may cause a decrease in Co-Enzyme Q-10. You should make sure that you get enough Co-Enzyme Q-10 while you are taking this medicine. Discuss the foods you eat and the vitamins you take with your health care professional. What side effects may I notice from receiving this medicine? Side effects that you should report to your doctor or health care professional as soon as possible:  allergic reactions like skin rash, itching or hives, swelling of the face, lips, or tongue  breathing problems  chest pain  fast or irregular heartbeat  low blood counts - this medicine may decrease the number of white blood cells, red blood cells and platelets. You may be at increased risk for infections and bleeding.  pain, redness, or irritation at site where injected  signs of infection - fever or chills, cough, sore throat, pain or difficulty passing  urine  signs of decreased platelets or bleeding - bruising, pinpoint red spots on the skin, black, tarry stools, blood in the urine  swelling of the ankles, feet, hands  tiredness  weakness Side effects that usually do not require medical attention (report to your doctor or health care professional if they continue or are bothersome):  diarrhea  hair loss  mouth sores  nail discoloration or damage  nausea  red colored urine  vomiting This list may not describe all possible side effects. Call your doctor for medical advice about side effects. You may report side effects to FDA at 1-800-FDA-1088. Where should I keep my medicine? This drug is given in a hospital or clinic and will not be stored at home. NOTE: This sheet is a summary. It may not cover all possible information. If you have questions about this medicine, talk to your doctor, pharmacist, or health care provider.  2021 Elsevier/Gold Standard (2016-09-16 11:01:26)

## 2020-06-20 ENCOUNTER — Inpatient Hospital Stay: Payer: 59

## 2020-06-20 ENCOUNTER — Other Ambulatory Visit: Payer: Self-pay

## 2020-06-20 VITALS — BP 130/80 | HR 72 | Temp 98.1°F | Resp 18

## 2020-06-20 DIAGNOSIS — C50411 Malignant neoplasm of upper-outer quadrant of right female breast: Secondary | ICD-10-CM

## 2020-06-20 DIAGNOSIS — Z5111 Encounter for antineoplastic chemotherapy: Secondary | ICD-10-CM | POA: Diagnosis not present

## 2020-06-20 DIAGNOSIS — Z170 Estrogen receptor positive status [ER+]: Secondary | ICD-10-CM

## 2020-06-20 MED ORDER — PEGFILGRASTIM-BMEZ 6 MG/0.6ML ~~LOC~~ SOSY
PREFILLED_SYRINGE | SUBCUTANEOUS | Status: AC
  Filled 2020-06-20: qty 0.6

## 2020-06-20 MED ORDER — PEGFILGRASTIM-BMEZ 6 MG/0.6ML ~~LOC~~ SOSY
6.0000 mg | PREFILLED_SYRINGE | Freq: Once | SUBCUTANEOUS | Status: AC
  Administered 2020-06-20: 6 mg via SUBCUTANEOUS

## 2020-06-20 NOTE — Patient Instructions (Signed)
Pegfilgrastim injection What is this medicine? PEGFILGRASTIM (PEG fil gra stim) is a long-acting granulocyte colony-stimulating factor that stimulates the growth of neutrophils, a type of white blood cell important in the body's fight against infection. It is used to reduce the incidence of fever and infection in patients with certain types of cancer who are receiving chemotherapy that affects the bone marrow, and to increase survival after being exposed to high doses of radiation. This medicine may be used for other purposes; ask your health care provider or pharmacist if you have questions. COMMON BRAND NAME(S): Fulphila, Neulasta, Nyvepria, UDENYCA, Ziextenzo What should I tell my health care provider before I take this medicine? They need to know if you have any of these conditions:  kidney disease  latex allergy  ongoing radiation therapy  sickle cell disease  skin reactions to acrylic adhesives (On-Body Injector only)  an unusual or allergic reaction to pegfilgrastim, filgrastim, other medicines, foods, dyes, or preservatives  pregnant or trying to get pregnant  breast-feeding How should I use this medicine? This medicine is for injection under the skin. If you get this medicine at home, you will be taught how to prepare and give the pre-filled syringe or how to use the On-body Injector. Refer to the patient Instructions for Use for detailed instructions. Use exactly as directed. Tell your healthcare provider immediately if you suspect that the On-body Injector may not have performed as intended or if you suspect the use of the On-body Injector resulted in a missed or partial dose. It is important that you put your used needles and syringes in a special sharps container. Do not put them in a trash can. If you do not have a sharps container, call your pharmacist or healthcare provider to get one. Talk to your pediatrician regarding the use of this medicine in children. While this drug  may be prescribed for selected conditions, precautions do apply. Overdosage: If you think you have taken too much of this medicine contact a poison control center or emergency room at once. NOTE: This medicine is only for you. Do not share this medicine with others. What if I miss a dose? It is important not to miss your dose. Call your doctor or health care professional if you miss your dose. If you miss a dose due to an On-body Injector failure or leakage, a new dose should be administered as soon as possible using a single prefilled syringe for manual use. What may interact with this medicine? Interactions have not been studied. This list may not describe all possible interactions. Give your health care provider a list of all the medicines, herbs, non-prescription drugs, or dietary supplements you use. Also tell them if you smoke, drink alcohol, or use illegal drugs. Some items may interact with your medicine. What should I watch for while using this medicine? Your condition will be monitored carefully while you are receiving this medicine. You may need blood work done while you are taking this medicine. Talk to your health care provider about your risk of cancer. You may be more at risk for certain types of cancer if you take this medicine. If you are going to need a MRI, CT scan, or other procedure, tell your doctor that you are using this medicine (On-Body Injector only). What side effects may I notice from receiving this medicine? Side effects that you should report to your doctor or health care professional as soon as possible:  allergic reactions (skin rash, itching or hives, swelling of   the face, lips, or tongue)  back pain  dizziness  fever  pain, redness, or irritation at site where injected  pinpoint red spots on the skin  red or dark-brown urine  shortness of breath or breathing problems  stomach or side pain, or pain at the shoulder  swelling  tiredness  trouble  passing urine or change in the amount of urine  unusual bruising or bleeding Side effects that usually do not require medical attention (report to your doctor or health care professional if they continue or are bothersome):  bone pain  muscle pain This list may not describe all possible side effects. Call your doctor for medical advice about side effects. You may report side effects to FDA at 1-800-FDA-1088. Where should I keep my medicine? Keep out of the reach of children. If you are using this medicine at home, you will be instructed on how to store it. Throw away any unused medicine after the expiration date on the label. NOTE: This sheet is a summary. It may not cover all possible information. If you have questions about this medicine, talk to your doctor, pharmacist, or health care provider.  2021 Elsevier/Gold Standard (2019-02-24 13:20:51)  

## 2020-06-24 NOTE — Progress Notes (Signed)
[facility]  [phone] [phone]     ID: Yolanda [name] DOB: [birth year]  MR#: [id number]  XID#:[id number]  Patient Care Team: Mosie Lukes, MD as PCP - General (Family Medicine) Michel Bickers, MD as Consulting Physician (Infectious Diseases) Megan Salon, MD as Consulting Physician (Gynecology) Athalie Newhard, Virgie Dad, MD as Consulting Physician (Oncology) Juanita Craver, MD as Consulting Physician (Gastroenterology) Stark Klein, MD as Consulting Physician (General Surgery) Mauro Kaufmann, RN as Oncology Nurse Navigator Rockwell Germany, RN as Oncology Nurse Navigator Chauncey Cruel, MD OTHER MD:   CHIEF COMPLAINT: Estrogen receptor positive breast cancer  CURRENT TREATMENT: Adjuvant chemotherapy   INTERVAL HISTORY: Yolanda [name] returns today for follow up and treatment of her estrogen receptor positive breast cancer.  She is accompanied by her husband [name]  She began adjuvant chemotherapy on 06/18/2020, consisting of cyclophosphamide and doxorubicin in dose dense fashion with pegfilgrastim on day 3.  Today is day 8 cycle 1  She received PEG filgrastim on day 3   REVIEW OF SYSTEMS: Yolanda [name] did generally quite well with this treatment.  She had no nausea or vomiting even though she actually did not take the medicines as prescribed because she had some nausea from the nausea medicines.  She has developed 2 small mouth blisters.  These are resolving.  She tries to walk every day despite feeling tired.  She is eating well.  She had no bony aches from the PEG filgrastim.  She has not begun to lose her hair.  A detailed review of systems today was otherwise stable   COVID 19 VACCINATION STATUS: fully vaccinated [employer]), with booster 12/2019   HISTORY OF CURRENT ILLNESS: From the original intake note:   I saw [name] in December 2018 for evaluation of atypical lobular hyperplasia.  We discussed various strategies regarding breast cancer  prevention at that time for her to consider.    More recently (around the time of the Super Bowl 2022) she found a lump in her right breast.  She underwent bilateral diagnostic mammography with tomography and right breast ultrasonography at [facility] on 04/10/2020 showing: breast density category C; palpable 3 cm mass in right breast at 12 o'clock; no enlarged adenopathy in right axilla.  Accordingly on the same day, she proceeded to biopsy of the right breast area in question. The pathology from this procedure ([id number]) showed: invasive ductal carcinoma, grade 3; ductal carcinoma in situ. Prognostic indicators significant for: estrogen receptor, 90% positive with moderate staining intensity and progesterone receptor, 0% negative. Proliferation marker Ki67 at 25%. HER2 equivocal by immunohistochemistry (2+), but negative by fluorescent in situ hybridization with a signals ratio 1.49 and number per cell 2.75.  Cancer Staging Malignant neoplasm of upper-outer quadrant of right breast in female, estrogen receptor positive ([place]) Staging form: Breast, AJCC 8th Edition - Clinical: Stage IIA (cT2, cN0, cM0, G3, ER+, PR+, HER2-) - Signed by Chauncey Cruel, MD on 04/18/2020  The patient's subsequent history is as detailed below.   PAST MEDICAL HISTORY: Past Medical History:  Diagnosis Date  . Asthma    with respiratory infection  . Cancer [facility])    breast cancer - right  . Diabetes mellitus without complication ([place])    type II  . Fatty liver   . Fibroid uterus   . Hypertension    Patient reports that she has never been diagnosied with HTN, "I take HCTZ for swelling in my feet, if needed."  . Osteoarthritis 03/10/2015  .  PONV (postoperative nausea and vomiting)    2014  . Refusal of blood transfusions as patient is Jehovah's Witness    NO BLOOD PRODUCTS  . Sleep apnea     PAST SURGICAL HISTORY: Past Surgical History:  Procedure Laterality Date  . BREAST CYST ASPIRATION    .  BREAST EXCISIONAL BIOPSY    . BREAST LUMPECTOMY WITH SENTINEL LYMPH NODE BIOPSY Right 05/21/2020   Procedure: RIGHT BREAST LUMPECTOMY WITH SENTINEL LYMPH NODE BIOPSY;  Surgeon: Stark Klein, MD;  Location: [facility];  Service: General;  Laterality: Right;  . CHOLECYSTECTOMY  1/11  . COLONOSCOPY    . DILATATION & CURRETTAGE/HYSTEROSCOPY WITH RESECTOCOPE N/A 03/13/2014   Procedure: DILATATION & CURETTAGE/HYSTEROSCOPY ;  Surgeon: Lyman Speller, MD;  Location: [facility];  Service: Gynecology;  Laterality: N/A;  . PORTACATH PLACEMENT Left 05/21/2020   Procedure: INSERTION PORT-A-CATH;  Surgeon: Stark Klein, MD;  Location: [facility];  Service: General;  Laterality: Left;  . RADIOACTIVE SEED GUIDED EXCISIONAL BREAST BIOPSY Left 12/30/2016   Procedure: RADIOACTIVE SEED GUIDED EXCISIONAL BREAST BIOPSY;  Surgeon: Stark Klein, MD;  Location: [facility];  Service: General;  Laterality: Left;    FAMILY HISTORY: Family History  Problem Relation Age of Onset  . Asthma Mother   . Diabetes Mother   . Diabetes Father   . Heart attack Father   . Diabetes Brother   . Diabetes Brother   . Asthma Brother   . Heart Problems Sister   . Asthma Sister   . Hypertension Sister   . Heart disease Maternal Grandfather   . Osteopenia Sister   The patient's father died from heart disease at the age of 68.  The patient's mother is 39 years old as of March 2022.  The patient has 4 brothers and 2 sisters.  She is not aware of any history of cancer in her family.   GYNECOLOGIC HISTORY:  Patient's last menstrual period was 02/21/2014. Menarche: 60 years old GX P 0 LMP age 20 Contraceptive 1 year, no complications HRT no  Hysterectomy? no BSO? 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  Her husband [name] works as a Marketing executive for [employer].  He has 2 children from a prior marriage, both living in [place].  One is traveling nurse.  The other 1 is a Administrator.   The patient is a Restaurant manager, fast food.    ADVANCED DIRECTIVES: In the absence of any documentation to the contrary, the patient's spouse is their HCPOA.  The patient made it clear at her visit 04/18/2020 that she would not accept any blood transfusion or blood products for any reason including to stave off death   HEALTH MAINTENANCE: Social History   Tobacco Use  . Smoking status: Never Smoker  . Smokeless tobacco: Never Used  Vaping Use  . Vaping Use: Never used  Substance Use Topics  . Alcohol use: No  . Drug use: No     Colonoscopy: 10/2019 (Dr. Collene Mares), recall 2026  PAP: 03/2017, negative  Bone density: 09/2018, -0.8   Allergies  Allergen Reactions  . Lisinopril Shortness Of Breath and Cough    wheezing    Current Outpatient Medications  Medication Sig Dispense Refill  . albuterol (PROVENTIL HFA;VENTOLIN HFA) 108 (90 Base) MCG/ACT inhaler Inhale 1-2 puffs into the lungs every 4 (four) hours as needed for wheezing or shortness of breath. 18 g 2  . atorvastatin (LIPITOR) 10 MG tablet Take 10 mg  by mouth every other day.    . b complex vitamins capsule Take 1 capsule by mouth every other day.    . B-D ULTRA-FINE 33 LANCETS MISC Check BG 2 times/day    . Carboxymethylcellul-Glycerin (REFRESH OPTIVE OP) Place 1 drop into both eyes 3 (three) times daily as needed (dry / irritated eyes).    . cholecalciferol (VITAMIN D3) 25 MCG (1000 UNIT) tablet Take 1,000 Units by mouth every other day.    Marland Kitchen dexamethasone (DECADRON) 4 MG tablet Take 1 tablet (4 mg total) by mouth 2 (two) times daily. Take daily for 3 days after chemo. Take with food. 30 tablet 1  . fluticasone (FLONASE) 50 MCG/ACT nasal spray Place 1 spray into both nostrils daily as needed for allergies or rhinitis.    Marland Kitchen glucose blood test strip Check BG 2 times/day.  Dx E11.65    . hydrochlorothiazide (HYDRODIURIL) 25 MG tablet Take 25 mg by mouth every other day.    . hydrocortisone cream 1 % Apply 1 application topically daily as  needed for itching.    . lidocaine-prilocaine (EMLA) cream Apply to affected area once 30 g 3  . loratadine (CLARITIN) 10 MG tablet Take 1 tablet (10 mg total) by mouth daily. 90 tablet 0  . magnesium oxide (MAG-OX) 400 MG tablet Take 400 mg by mouth every other day.    . metFORMIN (GLUCOPHAGE-XR) 500 MG 24 hr tablet Take 1,000 mg by mouth 2 (two) times daily.    . milk thistle 175 MG tablet Take 175 mg by mouth every other day.    . Omega-3 Fatty Acids (OMEGA 3 PO) Take 1 capsule by mouth every other day.    Marland Kitchen omeprazole (PRILOSEC) 40 MG capsule Take 40 mg by mouth daily as needed (acid reflux).    Marland Kitchen oxyCODONE (OXY IR/ROXICODONE) 5 MG immediate release tablet Take 1 tablet (5 mg total) by mouth every 6 (six) hours as needed for severe pain. 10 tablet 0  . prochlorperazine (COMPAZINE) 10 MG tablet Take 1 tablet (10 mg total) by mouth every 6 (six) hours as needed (Nausea or vomiting). 30 tablet 1  . SOYA LECITHIN PO Take 1 capsule by mouth every other day.    . vitamin C (ASCORBIC ACID) 500 MG tablet Take 500 mg by mouth every other day.     No current facility-administered medications for this visit.   SUPPLEMENTS: Aside from the medications listed above, the patient takes a teaspoon of olive oil daily, 1000 mg of vitamin C every other day, sunflower lecithin 600 mg every other day, vitamin D3 5000 mg every other day, magnesium 200 mg every other day, milk thistle 150 mg every other day, a B complex vitamin (B6 and B12) every other day, as well as Osteo Bi-Flex weekly and fluticasone as needed  OBJECTIVE: Middle-aged woman in no acute distress  Vitals:   06/25/20 1215  BP: (!) 146/82  Pulse: 83  Resp: 18  Temp: 97.9 F (36.6 C)  SpO2: 100%     Body mass index is 28.65 kg/m.   Wt Readings from Last 3 Encounters:  06/25/20 182 lb 14.4 oz (83 kg)  06/18/20 184 lb 1.6 oz (83.5 kg)  06/03/20 185 lb 1.6 oz (84 kg)      ECOG FS:1 - Symptomatic but completely ambulatory  Sclerae  unicteric, EOMs intact Wearing a mask No cervical or supraclavicular adenopathy Lungs no rales or rhonchi Heart regular rate and rhythm Abd soft, nontender, positive bowel sounds MSK  no focal spinal tenderness, no upper extremity lymphedema Neuro: nonfocal, well oriented, appropriate affect Breasts: Deferred  LAB RESULTS:  CMP     Component Value Date/Time   NA 132 (L) 06/25/2020 1158   NA 138 11/04/2017 0000   K 4.2 06/25/2020 1158   CL 98 06/25/2020 1158   CO2 28 06/25/2020 1158   GLUCOSE 101 (H) 06/25/2020 1158   BUN 10 06/25/2020 1158   BUN 14 11/04/2017 0000   CREATININE 0.60 06/25/2020 1158   CREATININE 0.81 04/18/2020 1527   CREATININE 0.60 09/07/2014 0950   CALCIUM 10.3 06/25/2020 1158   PROT 6.9 06/25/2020 1158   ALBUMIN 3.4 (L) 06/25/2020 1158   AST 17 06/25/2020 1158   AST 26 04/18/2020 1527   ALT 16 06/25/2020 1158   ALT 37 04/18/2020 1527   ALKPHOS 136 (H) 06/25/2020 1158   BILITOT 0.3 06/25/2020 1158   BILITOT 0.3 04/18/2020 1527   GFRNONAA >60 06/25/2020 1158   GFRNONAA >60 04/18/2020 1527   GFRAA >60 05/15/2019 1803    No results found for: TOTALPROTELP, ALBUMINELP, A1GS, A2GS, BETS, BETA2SER, GAMS, MSPIKE, SPEI  Lab Results  Component Value Date   WBC 3.3 (L) 06/25/2020   NEUTROABS PENDING 06/25/2020   HGB 9.9 (L) 06/25/2020   HCT 31.0 (L) 06/25/2020   MCV 91.2 06/25/2020   PLT 227 06/25/2020    No results found for: LABCA2  No components found for: HQIONG295  No results for input(s): INR in the last 168 hours.  No results found for: LABCA2  No results found for: MWU132  No results found for: GMW102  No results found for: VOZ366  No results found for: CA2729  No components found for: HGQUANT  No results found for: CEA1 / No results found for: CEA1   No results found for: AFPTUMOR  No results found for: CHROMOGRNA  No results found for: KPAFRELGTCHN, LAMBDASER, KAPLAMBRATIO (kappa/lambda light chains)  No results found  for: HGBA, HGBA2QUANT, HGBFQUANT, HGBSQUAN (Hemoglobinopathy evaluation)   No results found for: LDH  Lab Results  Component Value Date   IRON 38 (L) 10/16/2009   IRONPCTSAT 10.2 (L) 10/16/2009   (Iron and TIBC)  Lab Results  Component Value Date   FERRITIN 8.9 (L) 10/16/2009    Urinalysis    Component Value Date/Time   COLORURINE YELLOW 03/11/2009 1100   APPEARANCEUR CLEAR 03/11/2009 1100   LABSPEC 1.017 03/11/2009 1100   PHURINE 7.0 03/11/2009 1100   GLUCOSEU NEGATIVE 03/11/2009 1100   HGBUR LARGE (A) 03/11/2009 1100   BILIRUBINUR N 05/21/2015 1614   KETONESUR NEGATIVE 03/11/2009 1100   PROTEINUR N 05/21/2015 1614   PROTEINUR NEGATIVE 03/11/2009 1100   UROBILINOGEN negative 05/21/2015 1614   UROBILINOGEN 0.2 03/11/2009 1100   NITRITE N 05/21/2015 1614   NITRITE NEGATIVE 03/11/2009 1100   LEUKOCYTESUR Negative 05/21/2015 1614    STUDIES: No results found.   ELIGIBLE FOR AVAILABLE RESEARCH PROTOCOL: no  ASSESSMENT: 60 y.o. [place] woman status post right breast upper outer quadrant biopsy 04/10/2020 for a clinical T2N0, stage IIA invasive ductal carcinoma, grade 3, estrogen receptor positive, progesterone receptor and HER-2 negative, with an MIB-1 of 25%.  (1) Oncotype obtained from the original biopsy shows a score of 48, predicting a recurrence rate outside the breast within 9 years of 28% if the only systemic therapy is antiestrogen for 5 years.  It also predicts a greater than 15% benefit from chemotherapy.  (2) right lumpectomy and sentinel lymph node dissection 05/21/2020 shows a pT2 pN0,  stage IIB invasive ductal carcinoma, grade 3, with negative margins.  (3) adjuvant chemotherapy to consist of cyclophosphamide and doxorubicin in dose dense fashion x4 starting 06/18/2020, with Neulasta day 3; to be followed by paclitaxel weekly x12.  (a) echo 05/13/2020 shows an ejection fraction in the 55-60% range  (4) adjuvant radiation  (5)  antiestrogens.   PLAN: Yolanda [name] did generally quite well with her first cycle of doxorubicin/cyclophosphamide.  We are going to make a couple of small changes to make subsequent treatment better.  Instead of taking 8 mg of dexamethasone for nausea she will take 4 mg.  Instead of taking 10 mg of Compazine she will take 5.  Otherwise the instructions on taking the nausea medicines are unchanged.  I wrote her for Valtrex 500 mg and she will take that daily for the next 2 months.  That should prevent further problems with mouth sores.  She is of course welcome to try mouth rinses if she wishes.  I am starting her on Cipro 500 mg twice daily for 5 days.  She will repeat this on day 8 of each treatment and I have given her the specific dates on which to restart.  I emphasized that after 5 days she will stop so she will not simply continue taking the Cipro  Otherwise she will return in 1 week for cycle #2 and she will see me at that time  Total encounter time 35 minutes.Sarajane Jews C. Semiah Konczal, MD 06/25/2020 12:40 PM Medical Oncology and Hematology [facility] [place], [place] [address] Tel. [phone]    Fax. [phone]   This document serves as a record of services personally performed by Lurline Del, MD. It was created on his behalf by Wilburn Mylar, a trained medical scribe. The creation of this record is based on the scribe's personal observations and the provider's statements to them.   I, Lurline Del MD, have reviewed the above documentation for accuracy and completeness, and I agree with the above.   *Total Encounter Time as defined by the Centers for Medicare and Medicaid Services includes, in addition to the face-to-face time of a patient visit (documented in the note above) non-face-to-face time: obtaining and reviewing outside history, ordering and reviewing medications, tests or procedures, care coordination (communications with other health  care professionals or caregivers) and documentation in the medical record.

## 2020-06-25 ENCOUNTER — Inpatient Hospital Stay: Payer: 59

## 2020-06-25 ENCOUNTER — Telehealth: Payer: Self-pay | Admitting: Oncology

## 2020-06-25 ENCOUNTER — Inpatient Hospital Stay (HOSPITAL_BASED_OUTPATIENT_CLINIC_OR_DEPARTMENT_OTHER): Payer: 59 | Admitting: Oncology

## 2020-06-25 ENCOUNTER — Encounter: Payer: Self-pay | Admitting: *Deleted

## 2020-06-25 ENCOUNTER — Other Ambulatory Visit: Payer: Self-pay

## 2020-06-25 VITALS — BP 146/82 | HR 83 | Temp 97.9°F | Resp 18 | Ht 67.0 in | Wt 182.9 lb

## 2020-06-25 DIAGNOSIS — C50411 Malignant neoplasm of upper-outer quadrant of right female breast: Secondary | ICD-10-CM

## 2020-06-25 DIAGNOSIS — Z170 Estrogen receptor positive status [ER+]: Secondary | ICD-10-CM

## 2020-06-25 DIAGNOSIS — Z5111 Encounter for antineoplastic chemotherapy: Secondary | ICD-10-CM | POA: Diagnosis not present

## 2020-06-25 DIAGNOSIS — Z95828 Presence of other vascular implants and grafts: Secondary | ICD-10-CM

## 2020-06-25 LAB — CBC WITH DIFFERENTIAL/PLATELET
Abs Immature Granulocytes: 0.05 10*3/uL (ref 0.00–0.07)
Basophils Absolute: 0 10*3/uL (ref 0.0–0.1)
Basophils Relative: 1 %
Eosinophils Absolute: 0.2 10*3/uL (ref 0.0–0.5)
Eosinophils Relative: 5 %
HCT: 31 % — ABNORMAL LOW (ref 36.0–46.0)
Hemoglobin: 9.9 g/dL — ABNORMAL LOW (ref 12.0–15.0)
Immature Granulocytes: 2 %
Lymphocytes Relative: 42 %
Lymphs Abs: 1.4 10*3/uL (ref 0.7–4.0)
MCH: 29.1 pg (ref 26.0–34.0)
MCHC: 31.9 g/dL (ref 30.0–36.0)
MCV: 91.2 fL (ref 80.0–100.0)
Monocytes Absolute: 0.3 10*3/uL (ref 0.1–1.0)
Monocytes Relative: 8 %
Neutro Abs: 1.4 10*3/uL — ABNORMAL LOW (ref 1.7–7.7)
Neutrophils Relative %: 42 %
Platelets: 227 10*3/uL (ref 150–400)
RBC: 3.4 MIL/uL — ABNORMAL LOW (ref 3.87–5.11)
RDW: 13.7 % (ref 11.5–15.5)
WBC: 3.3 10*3/uL — ABNORMAL LOW (ref 4.0–10.5)
nRBC: 0 % (ref 0.0–0.2)

## 2020-06-25 LAB — COMPREHENSIVE METABOLIC PANEL
ALT: 16 U/L (ref 0–44)
AST: 17 U/L (ref 15–41)
Albumin: 3.4 g/dL — ABNORMAL LOW (ref 3.5–5.0)
Alkaline Phosphatase: 136 U/L — ABNORMAL HIGH (ref 38–126)
Anion gap: 6 (ref 5–15)
BUN: 10 mg/dL (ref 8–23)
CO2: 28 mmol/L (ref 22–32)
Calcium: 10.3 mg/dL (ref 8.9–10.3)
Chloride: 98 mmol/L (ref 98–111)
Creatinine, Ser: 0.6 mg/dL (ref 0.44–1.00)
GFR, Estimated: >60 mL/min (ref >60)
Glucose, Bld: 101 mg/dL — ABNORMAL HIGH (ref 70–99)
Potassium: 4.2 mmol/L (ref 3.5–5.1)
Sodium: 132 mmol/L — ABNORMAL LOW (ref 135–145)
Total Bilirubin: 0.3 mg/dL (ref 0.3–1.2)
Total Protein: 6.9 g/dL (ref 6.5–8.1)

## 2020-06-25 MED ORDER — SODIUM CHLORIDE 0.9% FLUSH
10.0000 mL | INTRAVENOUS | Status: DC | PRN
  Administered 2020-06-25: 10 mL
  Filled 2020-06-25: qty 10

## 2020-06-25 MED ORDER — HEPARIN SOD (PORK) LOCK FLUSH 100 UNIT/ML IV SOLN
500.0000 [IU] | Freq: Once | INTRAVENOUS | Status: AC
  Administered 2020-06-25: 1000 [IU] via INTRAVENOUS
  Filled 2020-06-25: qty 5

## 2020-06-25 MED ORDER — VALACYCLOVIR HCL 500 MG PO TABS: 500.0000 mg | ORAL_TABLET | Freq: Every day | ORAL | 1 refills | Status: DC

## 2020-06-25 MED ORDER — CIPROFLOXACIN HCL 500 MG PO TABS: 500.0000 mg | ORAL_TABLET | Freq: Two times a day (BID) | ORAL | 1 refills | Status: DC

## 2020-06-25 NOTE — Patient Instructions (Signed)
Implanted Port Insertion, Care After This sheet gives you information about how to care for yourself after your procedure. Your health care provider may also give you more specific instructions. If you have problems or questions, contact your health care provider. What can I expect after the procedure? After the procedure, it is common to have:  Discomfort at the port insertion site.  Bruising on the skin over the port. This should improve over 3-4 days. Follow these instructions at home: Port care  After your port is placed, you will get a manufacturer's information card. The card has information about your port. Keep this card with you at all times.  Take care of the port as told by your health care provider. Ask your health care provider if you or a family member can get training for taking care of the port at home. A home health care nurse may also take care of the port.  Make sure to remember what type of port you have. Incision care  Follow instructions from your health care provider about how to take care of your port insertion site. Make sure you: ? Wash your hands with soap and water before and after you change your bandage (dressing). If soap and water are not available, use hand sanitizer. ? Change your dressing as told by your health care provider. ? Leave stitches (sutures), skin glue, or adhesive strips in place. These skin closures may need to stay in place for 2 weeks or longer. If adhesive strip edges start to loosen and curl up, you may trim the loose edges. Do not remove adhesive strips completely unless your health care provider tells you to do that.  Check your port insertion site every day for signs of infection. Check for: ? Redness, swelling, or pain. ? Fluid or blood. ? Warmth. ? Pus or a bad smell.      Activity  Return to your normal activities as told by your health care provider. Ask your health care provider what activities are safe for you.  Do not  lift anything that is heavier than 10 lb (4.5 kg), or the limit that you are told, until your health care provider says that it is safe. General instructions  Take over-the-counter and prescription medicines only as told by your health care provider.  Do not take baths, swim, or use a hot tub until your health care provider approves. Ask your health care provider if you may take showers. You may only be allowed to take sponge baths.  Do not drive for 24 hours if you were given a sedative during your procedure.  Wear a medical alert bracelet in case of an emergency. This will tell any health care providers that you have a port.  Keep all follow-up visits as told by your health care provider. This is important. Contact a health care provider if:  You cannot flush your port with saline as directed, or you cannot draw blood from the port.  You have a fever or chills.  You have redness, swelling, or pain around your port insertion site.  You have fluid or blood coming from your port insertion site.  Your port insertion site feels warm to the touch.  You have pus or a bad smell coming from the port insertion site. Get help right away if:  You have chest pain or shortness of breath.  You have bleeding from your port that you cannot control. Summary  Take care of the port as told by your   health care provider. Keep the manufacturer's information card with you at all times.  Change your dressing as told by your health care provider.  Contact a health care provider if you have a fever or chills or if you have redness, swelling, or pain around your port insertion site.  Keep all follow-up visits as told by your health care provider. This information is not intended to replace advice given to you by your health care provider. Make sure you discuss any questions you have with your health care provider. Document Revised: 08/31/2017 Document Reviewed: 08/31/2017 Elsevier Patient Education   2021 [employer] Inc[employer]  

## 2020-06-25 NOTE — Telephone Encounter (Signed)
Scheduled per provider. Pt will receive an updated appt calendar per next visit

## 2020-06-25 NOTE — Telephone Encounter (Signed)
Scheduled appts per 5/10 sch msg. Will have updated calendar printed for pt at next visit.

## 2020-07-01 MED FILL — Dexamethasone Sodium Phosphate Inj 100 MG/10ML: INTRAMUSCULAR | Qty: 1 | Status: AC

## 2020-07-01 NOTE — Progress Notes (Signed)
[facility]  [phone] [phone]     ID: Yolanda [name] DOB: 1960-11-22  MR#: [id number]  XMI#:[id number]  Patient Care Team: Mosie Lukes, MD as PCP - General (Family Medicine) Michel Bickers, MD as Consulting Physician (Infectious Diseases) Megan Salon, MD as Consulting Physician (Gynecology) Catalyna Reilly, Virgie Dad, MD as Consulting Physician (Oncology) Juanita Craver, MD as Consulting Physician (Gastroenterology) Stark Klein, MD as Consulting Physician (General Surgery) Mauro Kaufmann, RN as Oncology Nurse Navigator Rockwell Germany, RN as Oncology Nurse Navigator Chauncey Cruel, MD OTHER MD:   CHIEF COMPLAINT: Estrogen receptor positive breast cancer  CURRENT TREATMENT: Adjuvant chemotherapy   INTERVAL HISTORY: Yolanda [name] returns today for follow up and treatment of her estrogen receptor positive breast cancer.  She is accompanied by her husband Yolanda [name]  She began adjuvant chemotherapy on 06/18/2020, consisting of cyclophosphamide and doxorubicin in dose dense fashion with pegfilgrastim on day 3.  Today is day 1 cycle 2  She received PEG filgrastim on day 3  I saw her on day 8 of her first cycle and noted she had some blisters.  We started her on Valtrex.  As she had no nausea I also suggested dropping the dexamethasone dose to 4 mg twice daily on the assigned days and dropping the Compazine dose to 5 mg.  I also wrote for her to take ciprofloxacin beginning of the day 8 of each cycle.   REVIEW OF SYSTEMS: Lonnette woke up feeling a little wobbly and noticed that she tended to 100 lean to the right.  After a couple of minutes that straightened out and she had no balance issues and no falls.  She has had no further mouth blisters.  She is feeling a little forgetful.  She is starting to experience taste perversion with everything tasting bitter.  Her sugar went up to 356 after day 1.  By day 2 it was down to the 130s.  Of course this is because of  concern.  She tolerated her Cipro with no side effects that she is aware of.   COVID 19 VACCINATION STATUS: fully vaccinated [employer]), with booster 12/2019   HISTORY OF CURRENT ILLNESS: From the original intake note:   I saw Ms. Pittinger in December 2018 for evaluation of atypical lobular hyperplasia.  We discussed various strategies regarding breast cancer prevention at that time for her to consider.    More recently (around the time of the Super Bowl 2022) she found a lump in her right breast.  She underwent bilateral diagnostic mammography with tomography and right breast ultrasonography at [facility] on 04/10/2020 showing: breast density category C; palpable 3 cm mass in right breast at 12 o'clock; no enlarged adenopathy in right axilla.  Accordingly on the same day, she proceeded to biopsy of the right breast area in question. The pathology from this procedure ([id number]) showed: invasive ductal carcinoma, grade 3; ductal carcinoma in situ. Prognostic indicators significant for: estrogen receptor, 90% positive with moderate staining intensity and progesterone receptor, 0% negative. Proliferation marker Ki67 at 25%. HER2 equivocal by immunohistochemistry (2+), but negative by fluorescent in situ hybridization with a signals ratio 1.49 and number per cell 2.75.  Cancer Staging Malignant neoplasm of upper-outer quadrant of right breast in female, estrogen receptor positive ([place]) Staging form: Breast, AJCC 8th Edition - Clinical: Stage IIA (cT2, cN0, cM0, G3, ER+, PR+, HER2-) - Signed by Chauncey Cruel, MD on 04/18/2020  The patient's subsequent history is as detailed below.  PAST MEDICAL HISTORY: Past Medical History:  Diagnosis Date  . Asthma    with respiratory infection  . Cancer [facility])    breast cancer - right  . Diabetes mellitus without complication ([place])    type II  . Fatty liver   . Fibroid uterus   . Hypertension    Patient reports that she has never been  diagnosied with HTN, "I take HCTZ for swelling in my feet, if needed."  . Osteoarthritis 03/10/2015  . PONV (postoperative nausea and vomiting)    2014  . Refusal of blood transfusions as patient is Jehovah's Witness    NO BLOOD PRODUCTS  . Sleep apnea     PAST SURGICAL HISTORY: Past Surgical History:  Procedure Laterality Date  . BREAST CYST ASPIRATION    . BREAST EXCISIONAL BIOPSY    . BREAST LUMPECTOMY WITH SENTINEL LYMPH NODE BIOPSY Right 05/21/2020   Procedure: RIGHT BREAST LUMPECTOMY WITH SENTINEL LYMPH NODE BIOPSY;  Surgeon: Stark Klein, MD;  Location: [facility];  Service: General;  Laterality: Right;  . CHOLECYSTECTOMY  1/11  . COLONOSCOPY    . DILATATION & CURRETTAGE/HYSTEROSCOPY WITH RESECTOCOPE N/A 03/13/2014   Procedure: DILATATION & CURETTAGE/HYSTEROSCOPY ;  Surgeon: Lyman Speller, MD;  Location: [facility];  Service: Gynecology;  Laterality: N/A;  . PORTACATH PLACEMENT Left 05/21/2020   Procedure: INSERTION PORT-A-CATH;  Surgeon: Stark Klein, MD;  Location: [facility];  Service: General;  Laterality: Left;  . RADIOACTIVE SEED GUIDED EXCISIONAL BREAST BIOPSY Left 12/30/2016   Procedure: RADIOACTIVE SEED GUIDED EXCISIONAL BREAST BIOPSY;  Surgeon: Stark Klein, MD;  Location: [facility];  Service: General;  Laterality: Left;    FAMILY HISTORY: Family History  Problem Relation Age of Onset  . Asthma Mother   . Diabetes Mother   . Diabetes Father   . Heart attack Father   . Diabetes Brother   . Diabetes Brother   . Asthma Brother   . Heart Problems Sister   . Asthma Sister   . Hypertension Sister   . Heart disease Maternal Grandfather   . Osteopenia Sister   The patient's father died from heart disease at the age of 21.  The patient's mother is 47 years old as of March 2022.  The patient has 4 brothers and 2 sisters.  She is not aware of any history of cancer in her family.   GYNECOLOGIC HISTORY:  Patient's last menstrual period was 02/21/2014. Menarche:  60 years old GX P 0 LMP age 23 Contraceptive 1 year, no complications HRT no  Hysterectomy? no BSO? 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  Her husband Yolanda [name] works as a Marketing executive for [employer].  He has 2 children from a prior marriage, both living in [place].  One is traveling nurse.  The other 1 is a Administrator.  The patient is a Restaurant manager, fast food.    ADVANCED DIRECTIVES: In the absence of any documentation to the contrary, the patient's spouse is their HCPOA.  The patient made it clear at her visit 04/18/2020 that she would not accept any blood transfusion or blood products for any reason including to stave off death   HEALTH MAINTENANCE: Social History   Tobacco Use  . Smoking status: Never Smoker  . Smokeless tobacco: Never Used  Vaping Use  . Vaping Use: Never used  Substance Use Topics  . Alcohol use: No  . Drug use: No     Colonoscopy: 10/2019 (Dr.  Mann), recall 2026  PAP: 03/2017, negative  Bone density: 09/2018, -0.8   Allergies  Allergen Reactions  . Lisinopril Shortness Of Breath and Cough    wheezing    Current Outpatient Medications  Medication Sig Dispense Refill  . albuterol (PROVENTIL HFA;VENTOLIN HFA) 108 (90 Base) MCG/ACT inhaler Inhale 1-2 puffs into the lungs every 4 (four) hours as needed for wheezing or shortness of breath. 18 g 2  . atorvastatin (LIPITOR) 10 MG tablet Take 10 mg by mouth every other day.    . b complex vitamins capsule Take 1 capsule by mouth every other day.    . B-D ULTRA-FINE 33 LANCETS MISC Check BG 2 times/day    . Carboxymethylcellul-Glycerin (REFRESH OPTIVE OP) Place 1 drop into both eyes 3 (three) times daily as needed (dry / irritated eyes).    . cholecalciferol (VITAMIN D3) 25 MCG (1000 UNIT) tablet Take 1,000 Units by mouth every other day.    . ciprofloxacin (CIPRO) 500 MG tablet Take 1 tablet (500 mg total) by mouth 2 (two) times daily. Take for 5 days, then stop. Repeat May  24, June 7 and June 21 40 tablet 1  . dexamethasone (DECADRON) 4 MG tablet Take 1 tablet (4 mg total) by mouth 2 (two) times daily. Take daily for 3 days after chemo. Take with food. 30 tablet 1  . fluticasone (FLONASE) 50 MCG/ACT nasal spray Place 1 spray into both nostrils daily as needed for allergies or rhinitis.    Marland Kitchen glucose blood test strip Check BG 2 times/day.  Dx E11.65    . hydrochlorothiazide (HYDRODIURIL) 25 MG tablet Take 25 mg by mouth every other day.    . hydrocortisone cream 1 % Apply 1 application topically daily as needed for itching.    . lidocaine-prilocaine (EMLA) cream Apply to affected area once 30 g 3  . loratadine (CLARITIN) 10 MG tablet Take 1 tablet (10 mg total) by mouth daily. 90 tablet 0  . magnesium oxide (MAG-OX) 400 MG tablet Take 400 mg by mouth every other day.    . metFORMIN (GLUCOPHAGE-XR) 500 MG 24 hr tablet Take 1,000 mg by mouth 2 (two) times daily.    . milk thistle 175 MG tablet Take 175 mg by mouth every other day.    . Omega-3 Fatty Acids (OMEGA 3 PO) Take 1 capsule by mouth every other day.    Marland Kitchen omeprazole (PRILOSEC) 40 MG capsule Take 40 mg by mouth daily as needed (acid reflux).    Marland Kitchen oxyCODONE (OXY IR/ROXICODONE) 5 MG immediate release tablet Take 1 tablet (5 mg total) by mouth every 6 (six) hours as needed for severe pain. 10 tablet 0  . prochlorperazine (COMPAZINE) 10 MG tablet Take 1 tablet (10 mg total) by mouth every 6 (six) hours as needed (Nausea or vomiting). 30 tablet 1  . SOYA LECITHIN PO Take 1 capsule by mouth every other day.    . valACYclovir (VALTREX) 500 MG tablet Take 1 tablet (500 mg total) by mouth daily. 60 tablet 1  . vitamin C (ASCORBIC ACID) 500 MG tablet Take 500 mg by mouth every other day.     No current facility-administered medications for this visit.   SUPPLEMENTS: Aside from the medications listed above, the patient takes a teaspoon of olive oil daily, 1000 mg of vitamin C every other day, sunflower lecithin 600 mg  every other day, vitamin D3 5000 mg every other day, magnesium 200 mg every other day, milk thistle 150 mg every other  day, a B complex vitamin (B6 and B12) every other day, as well as Osteo Bi-Flex weekly and fluticasone as needed  OBJECTIVE: Middle-aged woman in no acute distress  Vitals:   07/02/20 0845  BP: 123/77  Pulse: 98  Resp: 18  Temp: (!) 97.3 F (36.3 C)  SpO2: 97%     Body mass index is 28.25 kg/m.   Wt Readings from Last 3 Encounters:  07/02/20 180 lb 6.4 oz (81.8 kg)  06/25/20 182 lb 14.4 oz (83 kg)  06/18/20 184 lb 1.6 oz (83.5 kg)     ECOG FS:1 - Symptomatic but completely ambulatory  Sclerae unicteric, EOMs intact Wearing a mask No cervical or supraclavicular adenopathy Lungs no rales or rhonchi Heart regular rate and rhythm Abd soft, nontender, positive bowel sounds MSK no focal spinal tenderness, no upper extremity lymphedema Neuro: nonfocal, well oriented, appropriate affect; walk appears normal.  Heel-to-toe is a little wobbly but no falls Breasts: She had a hematoma drained from the right chest wall earlier this week.  This is not apparent today   LAB RESULTS:  CMP     Component Value Date/Time   NA 132 (L) 06/25/2020 1158   NA 138 11/04/2017 0000   K 4.2 06/25/2020 1158   CL 98 06/25/2020 1158   CO2 28 06/25/2020 1158   GLUCOSE 101 (H) 06/25/2020 1158   BUN 10 06/25/2020 1158   BUN 14 11/04/2017 0000   CREATININE 0.60 06/25/2020 1158   CREATININE 0.81 04/18/2020 1527   CREATININE 0.60 09/07/2014 0950   CALCIUM 10.3 06/25/2020 1158   PROT 6.9 06/25/2020 1158   ALBUMIN 3.4 (L) 06/25/2020 1158   AST 17 06/25/2020 1158   AST 26 04/18/2020 1527   ALT 16 06/25/2020 1158   ALT 37 04/18/2020 1527   ALKPHOS 136 (H) 06/25/2020 1158   BILITOT 0.3 06/25/2020 1158   BILITOT 0.3 04/18/2020 1527   GFRNONAA >60 06/25/2020 1158   GFRNONAA >60 04/18/2020 1527   GFRAA >60 05/15/2019 1803    No results found for: TOTALPROTELP, ALBUMINELP, A1GS, A2GS,  BETS, BETA2SER, GAMS, MSPIKE, SPEI  Lab Results  Component Value Date   WBC 9.6 07/02/2020   NEUTROABS PENDING 07/02/2020   HGB 10.5 (L) 07/02/2020   HCT 32.5 (L) 07/02/2020   MCV 90.5 07/02/2020   PLT 322 07/02/2020    No results found for: LABCA2  No components found for: [id number]  No results for input(s): INR in the last 168 hours.  No results found for: LABCA2  No results found for: XXW885  No results found for: WIU269  No results found for: ZAL036  No results found for: CA2729  No components found for: HGQUANT  No results found for: CEA1 / No results found for: CEA1   No results found for: AFPTUMOR  No results found for: CHROMOGRNA  No results found for: KPAFRELGTCHN, LAMBDASER, KAPLAMBRATIO (kappa/lambda light chains)  No results found for: HGBA, HGBA2QUANT, HGBFQUANT, HGBSQUAN (Hemoglobinopathy evaluation)   No results found for: LDH  Lab Results  Component Value Date   IRON 38 (L) 10/16/2009   IRONPCTSAT 10.2 (L) 10/16/2009   (Iron and TIBC)  Lab Results  Component Value Date   FERRITIN 8.9 (L) 10/16/2009    Urinalysis    Component Value Date/Time   COLORURINE YELLOW 03/11/2009 1100   APPEARANCEUR CLEAR 03/11/2009 1100   LABSPEC 1.017 03/11/2009 1100   PHURINE 7.0 03/11/2009 1100   GLUCOSEU NEGATIVE 03/11/2009 1100   HGBUR LARGE (A) 03/11/2009 1100  BILIRUBINUR N 05/21/2015 [place] 03/11/2009 1100   PROTEINUR N 05/21/2015 1614   PROTEINUR NEGATIVE 03/11/2009 1100   UROBILINOGEN negative 05/21/2015 1614   UROBILINOGEN 0.2 03/11/2009 1100   NITRITE N 05/21/2015 1614   NITRITE NEGATIVE 03/11/2009 1100   LEUKOCYTESUR Negative 05/21/2015 1614    STUDIES: No results found.   ELIGIBLE FOR AVAILABLE RESEARCH PROTOCOL: no  ASSESSMENT: 59 y.o. [place] woman status post right breast upper outer quadrant biopsy 04/10/2020 for a clinical T2N0, stage IIA invasive ductal carcinoma, grade 3, estrogen receptor positive,  progesterone receptor and HER-2 negative, with an MIB-1 of 25%.  (1) Oncotype obtained from the original biopsy shows a score of 48, predicting a recurrence rate outside the breast within 9 years of 28% if the only systemic therapy is antiestrogen for 5 years.  It also predicts a greater than 15% benefit from chemotherapy.  (2) right lumpectomy and sentinel lymph node dissection 05/21/2020 shows a pT2 pN0, stage IIB invasive ductal carcinoma, grade 3, with negative margins.  (3) adjuvant chemotherapy to consist of cyclophosphamide and doxorubicin in dose dense fashion x4 starting 06/18/2020, with Neulasta day 3; to be followed by paclitaxel weekly x12.  (a) echo 05/13/2020 shows an ejection fraction in the 55-60% range  (4) adjuvant radiation  (5) antiestrogens.   PLAN: Yolanda [name] is generally tolerating treatment well and we are proceeding with cycle #2 today.  I have eliminated steroids from her premeds and also for nausea on days 2 and 3 she will use only Compazine (at 5 mg before meals and at bedtime).  I think this will be enough to control her nausea without throwing her sugars out of kilter.  If she does have any side effects from the Compazine or if she has more nausea then she will call and let us know  I think the issue with balance she has in the morning may be due to inner ear issues and I suggested she take the Claritin continuously for the next 6 weeks.  She needs to resume ciprofloxacin a week from today and generally on day 8 of each [facility] treatment  Otherwise she will return in 2 weeks and in 4 weeks for her third and fourth cycles of AC.  I will see her the week after that to set her up for the paclitaxel doses to follow  Total encounter time 25 minutes.Yolanda [name] Jews C. Jere Bostrom, MD 07/02/2020 8:50 AM Medical Oncology and Hematology [facility] [place], [place] [address] Tel. [phone]    Fax. [phone]   This document serves as a record of  services personally performed by Lurline Del, MD. It was created on his behalf by Wilburn Mylar, a trained medical scribe. The creation of this record is based on the scribe's personal observations and the provider's statements to them.   I, Lurline Del MD, have reviewed the above documentation for accuracy and completeness, and I agree with the above.   *Total Encounter Time as defined by the Centers for Medicare and Medicaid Services includes, in addition to the face-to-face time of a patient visit (documented in the note above) non-face-to-face time: obtaining and reviewing outside history, ordering and reviewing medications, tests or procedures, care coordination (communications with other health care professionals or caregivers) and documentation in the medical record.

## 2020-07-02 ENCOUNTER — Inpatient Hospital Stay (HOSPITAL_BASED_OUTPATIENT_CLINIC_OR_DEPARTMENT_OTHER): Payer: 59 | Admitting: Oncology

## 2020-07-02 ENCOUNTER — Inpatient Hospital Stay: Payer: 59

## 2020-07-02 ENCOUNTER — Other Ambulatory Visit: Payer: Self-pay

## 2020-07-02 VITALS — BP 123/77 | HR 98 | Temp 97.3°F | Resp 18 | Ht 67.0 in | Wt 180.4 lb

## 2020-07-02 DIAGNOSIS — Z170 Estrogen receptor positive status [ER+]: Secondary | ICD-10-CM

## 2020-07-02 DIAGNOSIS — C50411 Malignant neoplasm of upper-outer quadrant of right female breast: Secondary | ICD-10-CM

## 2020-07-02 DIAGNOSIS — Z5111 Encounter for antineoplastic chemotherapy: Secondary | ICD-10-CM | POA: Diagnosis not present

## 2020-07-02 DIAGNOSIS — Z95828 Presence of other vascular implants and grafts: Secondary | ICD-10-CM

## 2020-07-02 LAB — COMPREHENSIVE METABOLIC PANEL
ALT: 22 U/L (ref 0–44)
AST: 23 U/L (ref 15–41)
Albumin: 3.4 g/dL — ABNORMAL LOW (ref 3.5–5.0)
Alkaline Phosphatase: 119 U/L (ref 38–126)
Anion gap: 11 (ref 5–15)
BUN: 11 mg/dL (ref 8–23)
CO2: 28 mmol/L (ref 22–32)
Calcium: 10.3 mg/dL (ref 8.9–10.3)
Chloride: 95 mmol/L — ABNORMAL LOW (ref 98–111)
Creatinine, Ser: 0.67 mg/dL (ref 0.44–1.00)
GFR, Estimated: >60 mL/min (ref >60)
Glucose, Bld: 173 mg/dL — ABNORMAL HIGH (ref 70–99)
Potassium: 3.8 mmol/L (ref 3.5–5.1)
Sodium: 134 mmol/L — ABNORMAL LOW (ref 135–145)
Total Bilirubin: <0.2 mg/dL — ABNORMAL LOW (ref 0.3–1.2)
Total Protein: 7 g/dL (ref 6.5–8.1)

## 2020-07-02 LAB — CBC WITH DIFFERENTIAL/PLATELET
Abs Immature Granulocytes: 1.16 10*3/uL — ABNORMAL HIGH (ref 0.00–0.07)
Basophils Absolute: 0.1 10*3/uL (ref 0.0–0.1)
Basophils Relative: 1 %
Eosinophils Absolute: 0.2 10*3/uL (ref 0.0–0.5)
Eosinophils Relative: 2 %
HCT: 32.5 % — ABNORMAL LOW (ref 36.0–46.0)
Hemoglobin: 10.5 g/dL — ABNORMAL LOW (ref 12.0–15.0)
Immature Granulocytes: 12 %
Lymphocytes Relative: 20 %
Lymphs Abs: 1.9 10*3/uL (ref 0.7–4.0)
MCH: 29.2 pg (ref 26.0–34.0)
MCHC: 32.3 g/dL (ref 30.0–36.0)
MCV: 90.5 fL (ref 80.0–100.0)
Monocytes Absolute: 0.9 10*3/uL (ref 0.1–1.0)
Monocytes Relative: 10 %
Neutro Abs: 5.3 10*3/uL (ref 1.7–7.7)
Neutrophils Relative %: 55 %
Platelets: 322 10*3/uL (ref 150–400)
RBC: 3.59 MIL/uL — ABNORMAL LOW (ref 3.87–5.11)
RDW: 13.7 % (ref 11.5–15.5)
WBC: 9.6 10*3/uL (ref 4.0–10.5)
nRBC: 0.2 % (ref 0.0–0.2)

## 2020-07-02 MED ORDER — OCTREOTIDE ACETATE 30 MG IM KIT
PACK | INTRAMUSCULAR | Status: AC
  Filled 2020-07-02: qty 1

## 2020-07-02 MED ORDER — PALONOSETRON HCL INJECTION 0.25 MG/5ML
INTRAVENOUS | Status: AC
  Filled 2020-07-02: qty 5

## 2020-07-02 MED ORDER — SODIUM CHLORIDE 0.9 % IV SOLN
Freq: Once | INTRAVENOUS | Status: AC
Start: 2020-07-02 — End: 2020-07-02
  Filled 2020-07-02: qty 250

## 2020-07-02 MED ORDER — PROCHLORPERAZINE EDISYLATE 10 MG/2ML IJ SOLN
INTRAMUSCULAR | Status: AC
  Filled 2020-07-02: qty 2

## 2020-07-02 MED ORDER — SODIUM CHLORIDE 0.9% FLUSH
10.0000 mL | INTRAVENOUS | Status: DC | PRN
  Administered 2020-07-02: 10 mL
  Filled 2020-07-02: qty 10

## 2020-07-02 MED ORDER — PALONOSETRON HCL INJECTION 0.25 MG/5ML
0.2500 mg | Freq: Once | INTRAVENOUS | Status: AC
  Administered 2020-07-02: 0.25 mg via INTRAVENOUS

## 2020-07-02 MED ORDER — DOXORUBICIN HCL CHEMO IV INJECTION 2 MG/ML
60.0000 mg/m2 | Freq: Once | INTRAVENOUS | Status: AC
  Administered 2020-07-02: 120 mg via INTRAVENOUS
  Filled 2020-07-02: qty 60

## 2020-07-02 MED ORDER — SODIUM CHLORIDE 0.9 % IV SOLN
600.0000 mg/m2 | Freq: Once | INTRAVENOUS | Status: AC
  Administered 2020-07-02: 1200 mg via INTRAVENOUS
  Filled 2020-07-02: qty 60

## 2020-07-02 MED ORDER — SODIUM CHLORIDE 0.9 % IV SOLN
150.0000 mg | Freq: Once | INTRAVENOUS | Status: AC
  Administered 2020-07-02: 150 mg via INTRAVENOUS
  Filled 2020-07-02: qty 150

## 2020-07-02 MED ORDER — PROCHLORPERAZINE EDISYLATE 10 MG/2ML IJ SOLN
10.0000 mg | Freq: Once | INTRAMUSCULAR | Status: AC
Start: 2020-07-02 — End: 2020-07-02
  Administered 2020-07-02: 10 mg via INTRAVENOUS

## 2020-07-02 MED ORDER — HEPARIN SOD (PORK) LOCK FLUSH 100 UNIT/ML IV SOLN
500.0000 [IU] | Freq: Once | INTRAVENOUS | Status: AC | PRN
  Administered 2020-07-02: 500 [IU]
  Filled 2020-07-02: qty 5

## 2020-07-02 NOTE — Patient Instructions (Signed)
[facility]  Discharge Instructions: Thank you for choosing [facility] to provide your oncology and hematology care.   If you have a lab appointment with the [facility], please go directly to the [facility] and check in at the registration area.   Wear comfortable clothing and clothing appropriate for easy access to any Portacath or PICC line.   We strive to give you quality time with your provider. You may need to reschedule your appointment if you arrive late (15 or more minutes).  Arriving late affects you and other patients whose appointments are after yours.  Also, if you miss three or more appointments without notifying the office, you may be dismissed from the clinic at the provider's discretion.      For prescription refill requests, have your pharmacy contact our office and allow 72 hours for refills to be completed.    Today you received the following chemotherapy and/or immunotherapy agents Adrimycin, Cytoxan   To help prevent nausea and vomiting after your treatment, we encourage you to take your nausea medication as directed.  BELOW ARE SYMPTOMS THAT SHOULD BE REPORTED IMMEDIATELY: . *FEVER GREATER THAN 100.4 F (38 C) OR HIGHER . *CHILLS OR SWEATING . *NAUSEA AND VOMITING THAT IS NOT CONTROLLED WITH YOUR NAUSEA MEDICATION . *UNUSUAL SHORTNESS OF BREATH . *UNUSUAL BRUISING OR BLEEDING . *URINARY PROBLEMS (pain or burning when urinating, or frequent urination) . *BOWEL PROBLEMS (unusual diarrhea, constipation, pain near the anus) . TENDERNESS IN MOUTH AND THROAT WITH OR WITHOUT PRESENCE OF ULCERS (sore throat, sores in mouth, or a toothache) . UNUSUAL RASH, SWELLING OR PAIN  . UNUSUAL VAGINAL DISCHARGE OR ITCHING   Items with * indicate a potential emergency and should be followed up as soon as possible or go to the Emergency Department if any problems should occur.  Please show the CHEMOTHERAPY ALERT CARD or IMMUNOTHERAPY  ALERT CARD at check-in to the Emergency Department and triage nurse.  Should you have questions after your visit or need to cancel or reschedule your appointment, please contact [facility]  Dept: [phone]  and follow the prompts.  Office hours are 8:00 a.m. to 4:30 p.m. Monday - Friday. Please note that voicemails left after 4:00 p.m. may not be returned until the following business day.  We are closed weekends and major holidays. You have access to a nurse at all times for urgent questions. Please call the main number to the clinic Dept: [phone] and follow the prompts.   For any non-urgent questions, you may also contact your provider using MyChart. We now offer e-Visits for anyone 78 and older to request care online for non-urgent symptoms. For details visit [email or web address].   Also download the MyChart app! Go to the app store, search "MyChart", open the app, select [facility], and log in with your MyChart username and password.  Due to Covid, a mask is required upon entering the hospital/clinic. If you do not have a mask, one will be given to you upon arrival. For doctor visits, patients may have 1 support person aged 68 or older with them. For treatment visits, patients cannot have anyone with them due to current Covid guidelines and our immunocompromised population.    Cyclophosphamide Injection What is this medicine? CYCLOPHOSPHAMIDE (sye kloe FOSS fa mide) is a chemotherapy drug. It slows the growth of cancer cells. This medicine is used to treat many types of cancer like lymphoma, myeloma, leukemia, breast cancer, and  ovarian cancer, to name a few. This medicine may be used for other purposes; ask your health care provider or pharmacist if you have questions. COMMON BRAND NAME(S): Cytoxan, Neosar What should I tell my health care provider before I take this medicine? They need to know if you have any of these conditions:  heart  disease  history of irregular heartbeat  infection  kidney disease  liver disease  low blood counts, like white cells, platelets, or red blood cells  on hemodialysis  recent or ongoing radiation therapy  scarring or thickening of the lungs  trouble passing urine  an unusual or allergic reaction to cyclophosphamide, other medicines, foods, dyes, or preservatives  pregnant or trying to get pregnant  breast-feeding How should I use this medicine? This drug is usually given as an injection into a vein or muscle or by infusion into a vein. It is administered in a hospital or clinic by a specially trained health care professional. Talk to your pediatrician regarding the use of this medicine in children. Special care may be needed. Overdosage: If you think you have taken too much of this medicine contact a poison control center or emergency room at once. NOTE: This medicine is only for you. Do not share this medicine with others. What if I miss a dose? It is important not to miss your dose. Call your doctor or health care professional if you are unable to keep an appointment. What may interact with this medicine?  amphotericin B  azathioprine  certain antivirals for HIV or hepatitis  certain medicines for blood pressure, heart disease, irregular heart beat  certain medicines that treat or prevent blood clots like warfarin  certain other medicines for cancer  cyclosporine  etanercept  indomethacin  medicines that relax muscles for surgery  medicines to increase blood counts  metronidazole This list may not describe all possible interactions. Give your health care provider a list of all the medicines, herbs, non-prescription drugs, or dietary supplements you use. Also tell them if you smoke, drink alcohol, or use illegal drugs. Some items may interact with your medicine. What should I watch for while using this medicine? Your condition will be monitored carefully  while you are receiving this medicine. You may need blood work done while you are taking this medicine. Drink water or other fluids as directed. Urinate often, even at night. Some products may contain alcohol. Ask your health care professional if this medicine contains alcohol. Be sure to tell all health care professionals you are taking this medicine. Certain medicines, like metronidazole and disulfiram, can cause an unpleasant reaction when taken with alcohol. The reaction includes flushing, headache, nausea, vomiting, sweating, and increased thirst. The reaction can last from 30 minutes to several hours. Do not become pregnant while taking this medicine or for 1 year after stopping it. Women should inform their health care professional if they wish to become pregnant or think they might be pregnant. Men should not father a child while taking this medicine and for 4 months after stopping it. There is potential for serious side effects to an unborn child. Talk to your health care professional for more information. Do not breast-feed an infant while taking this medicine or for 1 week after stopping it. This medicine has caused ovarian failure in some women. This medicine may make it more difficult to get pregnant. Talk to your health care professional if you are concerned about your fertility. This medicine has caused decreased sperm counts in some men.   This may make it more difficult to father a child. Talk to your health care professional if you are concerned about your fertility. Call your health care professional for advice if you get a fever, chills, or sore throat, or other symptoms of a cold or flu. Do not treat yourself. This medicine decreases your body's ability to fight infections. Try to avoid being around people who are sick. Avoid taking medicines that contain aspirin, acetaminophen, ibuprofen, naproxen, or ketoprofen unless instructed by your health care professional. These medicines may hide  a fever. Talk to your health care professional about your risk of cancer. You may be more at risk for certain types of cancer if you take this medicine. If you are going to need surgery or other procedure, tell your health care professional that you are using this medicine. Be careful brushing or flossing your teeth or using a toothpick because you may get an infection or bleed more easily. If you have any dental work done, tell your dentist you are receiving this medicine. What side effects may I notice from receiving this medicine? Side effects that you should report to your doctor or health care professional as soon as possible:  allergic reactions like skin rash, itching or hives, swelling of the face, lips, or tongue  breathing problems  nausea, vomiting  signs and symptoms of bleeding such as bloody or black, tarry stools; red or dark brown urine; spitting up blood or brown material that looks like coffee grounds; red spots on the skin; unusual bruising or bleeding from the eyes, gums, or nose  signs and symptoms of heart failure like fast, irregular heartbeat, sudden weight gain; swelling of the ankles, feet, hands  signs and symptoms of infection like fever; chills; cough; sore throat; pain or trouble passing urine  signs and symptoms of kidney injury like trouble passing urine or change in the amount of urine  signs and symptoms of liver injury like dark yellow or brown urine; general ill feeling or flu-like symptoms; light-colored stools; loss of appetite; nausea; right upper belly pain; unusually weak or tired; yellowing of the eyes or skin Side effects that usually do not require medical attention (report to your doctor or health care professional if they continue or are bothersome):  confusion  decreased hearing  diarrhea  facial flushing  hair loss  headache  loss of appetite  missed menstrual periods  signs and symptoms of low red blood cells or anemia such as  unusually weak or tired; feeling faint or lightheaded; falls  skin discoloration This list may not describe all possible side effects. Call your doctor for medical advice about side effects. You may report side effects to FDA at 1-800-FDA-1088. Where should I keep my medicine? This drug is given in a hospital or clinic and will not be stored at home. NOTE: This sheet is a summary. It may not cover all possible information. If you have questions about this medicine, talk to your doctor, pharmacist, or health care provider.  2021 Elsevier/Gold Standard (2018-11-07 09:53:29)  Doxorubicin injection What is this medicine? DOXORUBICIN (dox oh ROO bi sin) is a chemotherapy drug. It is used to treat many kinds of cancer like leukemia, lymphoma, neuroblastoma, sarcoma, and Wilms' tumor. It is also used to treat bladder cancer, breast cancer, lung cancer, ovarian cancer, stomach cancer, and thyroid cancer. This medicine may be used for other purposes; ask your health care provider or pharmacist if you have questions. COMMON BRAND NAME(S): Adriamycin, Adriamycin PFS, Adriamycin  RDF, Rubex What should I tell my health care provider before I take this medicine? They need to know if you have any of these conditions:  heart disease  history of low blood counts caused by a medicine  liver disease  recent or ongoing radiation therapy  an unusual or allergic reaction to doxorubicin, other chemotherapy agents, other medicines, foods, dyes, or preservatives  pregnant or trying to get pregnant  breast-feeding How should I use this medicine? This drug is given as an infusion into a vein. It is administered in a hospital or clinic by a specially trained health care professional. If you have pain, swelling, burning or any unusual feeling around the site of your injection, tell your health care professional right away. Talk to your pediatrician regarding the use of this medicine in children. Special care may  be needed. Overdosage: If you think you have taken too much of this medicine contact a poison control center or emergency room at once. NOTE: This medicine is only for you. Do not share this medicine with others. What if I miss a dose? It is important not to miss your dose. Call your doctor or health care professional if you are unable to keep an appointment. What may interact with this medicine? This medicine may interact with the following medications:  6-mercaptopurine  paclitaxel  phenytoin  St. John's Wort  trastuzumab  verapamil This list may not describe all possible interactions. Give your health care provider a list of all the medicines, herbs, non-prescription drugs, or dietary supplements you use. Also tell them if you smoke, drink alcohol, or use illegal drugs. Some items may interact with your medicine. What should I watch for while using this medicine? This drug may make you feel generally unwell. This is not uncommon, as chemotherapy can affect healthy cells as well as cancer cells. Report any side effects. Continue your course of treatment even though you feel ill unless your doctor tells you to stop. There is a maximum amount of this medicine you should receive throughout your life. The amount depends on the medical condition being treated and your overall health. Your doctor will watch how much of this medicine you receive in your lifetime. Tell your doctor if you have taken this medicine before. You may need blood work done while you are taking this medicine. Your urine may turn red for a few days after your dose. This is not blood. If your urine is dark or brown, call your doctor. In some cases, you may be given additional medicines to help with side effects. Follow all directions for their use. Call your doctor or health care professional for advice if you get a fever, chills or sore throat, or other symptoms of a cold or flu. Do not treat yourself. This drug decreases  your body's ability to fight infections. Try to avoid being around people who are sick. This medicine may increase your risk to bruise or bleed. Call your doctor or health care professional if you notice any unusual bleeding. Talk to your doctor about your risk of cancer. You may be more at risk for certain types of cancers if you take this medicine. Do not become pregnant while taking this medicine or for 6 months after stopping it. Women should inform their doctor if they wish to become pregnant or think they might be pregnant. Men should not father a child while taking this medicine and for 6 months after stopping it. There is a potential for serious side  effects to an unborn child. Talk to your health care professional or pharmacist for more information. Do not breast-feed an infant while taking this medicine. This medicine has caused ovarian failure in some women and reduced sperm counts in some men This medicine may interfere with the ability to have a child. Talk with your doctor or health care professional if you are concerned about your fertility. This medicine may cause a decrease in Co-Enzyme Q-10. You should make sure that you get enough Co-Enzyme Q-10 while you are taking this medicine. Discuss the foods you eat and the vitamins you take with your health care professional. What side effects may I notice from receiving this medicine? Side effects that you should report to your doctor or health care professional as soon as possible:  allergic reactions like skin rash, itching or hives, swelling of the face, lips, or tongue  breathing problems  chest pain  fast or irregular heartbeat  low blood counts - this medicine may decrease the number of white blood cells, red blood cells and platelets. You may be at increased risk for infections and bleeding.  pain, redness, or irritation at site where injected  signs of infection - fever or chills, cough, sore throat, pain or difficulty passing  urine  signs of decreased platelets or bleeding - bruising, pinpoint red spots on the skin, black, tarry stools, blood in the urine  swelling of the ankles, feet, hands  tiredness  weakness Side effects that usually do not require medical attention (report to your doctor or health care professional if they continue or are bothersome):  diarrhea  hair loss  mouth sores  nail discoloration or damage  nausea  red colored urine  vomiting This list may not describe all possible side effects. Call your doctor for medical advice about side effects. You may report side effects to FDA at 1-800-FDA-1088. Where should I keep my medicine? This drug is given in a hospital or clinic and will not be stored at home. NOTE: This sheet is a summary. It may not cover all possible information. If you have questions about this medicine, talk to your doctor, pharmacist, or health care provider.  2021 Elsevier/Gold Standard (2016-09-16 11:01:26)

## 2020-07-03 ENCOUNTER — Other Ambulatory Visit: Payer: Self-pay | Admitting: Oncology

## 2020-07-03 ENCOUNTER — Other Ambulatory Visit: Payer: Self-pay | Admitting: *Deleted

## 2020-07-03 MED ORDER — PROCHLORPERAZINE MALEATE 5 MG PO TABS: 5.0000 mg | ORAL_TABLET | Freq: Four times a day (QID) | ORAL | 0 refills | Status: DC | PRN

## 2020-07-03 NOTE — Telephone Encounter (Signed)
Pt left message stating she thought Dr Jana Hakim was calling in a new prescription for nausea - she has not heard from the pharmacy.  This RN reviewed chart and noted MD recommended holding steroids and use of compazine 5 mg - Noted pt has prescription on med list for 10 mg dose.  This RN returned call- obtained identified VM- detailed message left per above- if she has the 10 mg on hand she can break them in 1/2 to use- but a prescription for the 5 mg dose will be sent in as well.

## 2020-07-04 ENCOUNTER — Inpatient Hospital Stay: Payer: 59

## 2020-07-04 ENCOUNTER — Other Ambulatory Visit: Payer: Self-pay

## 2020-07-04 VITALS — BP 133/76 | HR 79 | Temp 98.5°F | Resp 18

## 2020-07-04 DIAGNOSIS — Z170 Estrogen receptor positive status [ER+]: Secondary | ICD-10-CM

## 2020-07-04 DIAGNOSIS — Z5111 Encounter for antineoplastic chemotherapy: Secondary | ICD-10-CM | POA: Diagnosis not present

## 2020-07-04 DIAGNOSIS — C50411 Malignant neoplasm of upper-outer quadrant of right female breast: Secondary | ICD-10-CM

## 2020-07-04 MED ORDER — PEGFILGRASTIM-BMEZ 6 MG/0.6ML ~~LOC~~ SOSY
PREFILLED_SYRINGE | SUBCUTANEOUS | Status: AC
  Filled 2020-07-04: qty 0.6

## 2020-07-04 MED ORDER — PEGFILGRASTIM-BMEZ 6 MG/0.6ML ~~LOC~~ SOSY
6.0000 mg | PREFILLED_SYRINGE | Freq: Once | SUBCUTANEOUS | Status: AC
  Administered 2020-07-04: 6 mg via SUBCUTANEOUS

## 2020-07-04 NOTE — Patient Instructions (Signed)
Pegfilgrastim injection What is this medicine? PEGFILGRASTIM (PEG fil gra stim) is a long-acting granulocyte colony-stimulating factor that stimulates the growth of neutrophils, a type of white blood cell important in the body's fight against infection. It is used to reduce the incidence of fever and infection in patients with certain types of cancer who are receiving chemotherapy that affects the bone marrow, and to increase survival after being exposed to high doses of radiation. This medicine may be used for other purposes; ask your health care provider or pharmacist if you have questions. COMMON BRAND NAME(S): Fulphila, Neulasta, Nyvepria, UDENYCA, Ziextenzo What should I tell my health care provider before I take this medicine? They need to know if you have any of these conditions:  kidney disease  latex allergy  ongoing radiation therapy  sickle cell disease  skin reactions to acrylic adhesives (On-Body Injector only)  an unusual or allergic reaction to pegfilgrastim, filgrastim, other medicines, foods, dyes, or preservatives  pregnant or trying to get pregnant  breast-feeding How should I use this medicine? This medicine is for injection under the skin. If you get this medicine at home, you will be taught how to prepare and give the pre-filled syringe or how to use the On-body Injector. Refer to the patient Instructions for Use for detailed instructions. Use exactly as directed. Tell your healthcare provider immediately if you suspect that the On-body Injector may not have performed as intended or if you suspect the use of the On-body Injector resulted in a missed or partial dose. It is important that you put your used needles and syringes in a special sharps container. Do not put them in a trash can. If you do not have a sharps container, call your pharmacist or healthcare provider to get one. Talk to your pediatrician regarding the use of this medicine in children. While this drug  may be prescribed for selected conditions, precautions do apply. Overdosage: If you think you have taken too much of this medicine contact a poison control center or emergency room at once. NOTE: This medicine is only for you. Do not share this medicine with others. What if I miss a dose? It is important not to miss your dose. Call your doctor or health care professional if you miss your dose. If you miss a dose due to an On-body Injector failure or leakage, a new dose should be administered as soon as possible using a single prefilled syringe for manual use. What may interact with this medicine? Interactions have not been studied. This list may not describe all possible interactions. Give your health care provider a list of all the medicines, herbs, non-prescription drugs, or dietary supplements you use. Also tell them if you smoke, drink alcohol, or use illegal drugs. Some items may interact with your medicine. What should I watch for while using this medicine? Your condition will be monitored carefully while you are receiving this medicine. You may need blood work done while you are taking this medicine. Talk to your health care provider about your risk of cancer. You may be more at risk for certain types of cancer if you take this medicine. If you are going to need a MRI, CT scan, or other procedure, tell your doctor that you are using this medicine (On-Body Injector only). What side effects may I notice from receiving this medicine? Side effects that you should report to your doctor or health care professional as soon as possible:  allergic reactions (skin rash, itching or hives, swelling of   the face, lips, or tongue)  back pain  dizziness  fever  pain, redness, or irritation at site where injected  pinpoint red spots on the skin  red or dark-brown urine  shortness of breath or breathing problems  stomach or side pain, or pain at the shoulder  swelling  tiredness  trouble  passing urine or change in the amount of urine  unusual bruising or bleeding Side effects that usually do not require medical attention (report to your doctor or health care professional if they continue or are bothersome):  bone pain  muscle pain This list may not describe all possible side effects. Call your doctor for medical advice about side effects. You may report side effects to FDA at 1-800-FDA-1088. Where should I keep my medicine? Keep out of the reach of children. If you are using this medicine at home, you will be instructed on how to store it. Throw away any unused medicine after the expiration date on the label. NOTE: This sheet is a summary. It may not cover all possible information. If you have questions about this medicine, talk to your doctor, pharmacist, or health care provider.  2021 Elsevier/Gold Standard (2019-02-24 13:20:51)  

## 2020-07-10 ENCOUNTER — Encounter: Payer: Self-pay | Admitting: Family Medicine

## 2020-07-10 ENCOUNTER — Other Ambulatory Visit: Payer: Self-pay | Admitting: Family Medicine

## 2020-07-10 MED ORDER — METFORMIN HCL ER 500 MG PO TB24: 1000.0000 mg | ORAL_TABLET | Freq: Two times a day (BID) | ORAL | 3 refills | Status: DC

## 2020-07-16 ENCOUNTER — Inpatient Hospital Stay: Payer: 59

## 2020-07-16 ENCOUNTER — Inpatient Hospital Stay (HOSPITAL_BASED_OUTPATIENT_CLINIC_OR_DEPARTMENT_OTHER): Payer: 59 | Admitting: Physician Assistant

## 2020-07-16 ENCOUNTER — Other Ambulatory Visit: Payer: Self-pay

## 2020-07-16 VITALS — BP 115/75 | HR 106 | Temp 97.9°F | Resp 18 | Ht 67.0 in | Wt 180.0 lb

## 2020-07-16 VITALS — HR 89

## 2020-07-16 DIAGNOSIS — C50411 Malignant neoplasm of upper-outer quadrant of right female breast: Secondary | ICD-10-CM | POA: Diagnosis not present

## 2020-07-16 DIAGNOSIS — Z5111 Encounter for antineoplastic chemotherapy: Secondary | ICD-10-CM

## 2020-07-16 DIAGNOSIS — Z170 Estrogen receptor positive status [ER+]: Secondary | ICD-10-CM

## 2020-07-16 DIAGNOSIS — H5789 Other specified disorders of eye and adnexa: Secondary | ICD-10-CM | POA: Insufficient documentation

## 2020-07-16 DIAGNOSIS — Z95828 Presence of other vascular implants and grafts: Secondary | ICD-10-CM

## 2020-07-16 DIAGNOSIS — Z0000 Encounter for general adult medical examination without abnormal findings: Secondary | ICD-10-CM | POA: Insufficient documentation

## 2020-07-16 LAB — COMPREHENSIVE METABOLIC PANEL
ALT: 17 U/L (ref 0–44)
AST: 17 U/L (ref 15–41)
Albumin: 3.5 g/dL (ref 3.5–5.0)
Alkaline Phosphatase: 157 U/L — ABNORMAL HIGH (ref 38–126)
Anion gap: 14 (ref 5–15)
BUN: 11 mg/dL (ref 8–23)
CO2: 26 mmol/L (ref 22–32)
Calcium: 10.4 mg/dL — ABNORMAL HIGH (ref 8.9–10.3)
Chloride: 97 mmol/L — ABNORMAL LOW (ref 98–111)
Creatinine, Ser: 0.8 mg/dL (ref 0.44–1.00)
GFR, Estimated: >60 mL/min (ref >60)
Glucose, Bld: 156 mg/dL — ABNORMAL HIGH (ref 70–99)
Potassium: 4.1 mmol/L (ref 3.5–5.1)
Sodium: 137 mmol/L (ref 135–145)
Total Bilirubin: <0.2 mg/dL — ABNORMAL LOW (ref 0.3–1.2)
Total Protein: 7.1 g/dL (ref 6.5–8.1)

## 2020-07-16 LAB — CBC WITH DIFFERENTIAL/PLATELET
Abs Immature Granulocytes: 3.59 10*3/uL — ABNORMAL HIGH (ref 0.00–0.07)
Basophils Absolute: 0.1 10*3/uL (ref 0.0–0.1)
Basophils Relative: 0 %
Eosinophils Absolute: 0.2 10*3/uL (ref 0.0–0.5)
Eosinophils Relative: 1 %
HCT: 30.6 % — ABNORMAL LOW (ref 36.0–46.0)
Hemoglobin: 10.1 g/dL — ABNORMAL LOW (ref 12.0–15.0)
Immature Granulocytes: 16 %
Lymphocytes Relative: 10 %
Lymphs Abs: 2.4 10*3/uL (ref 0.7–4.0)
MCH: 29.6 pg (ref 26.0–34.0)
MCHC: 33 g/dL (ref 30.0–36.0)
MCV: 89.7 fL (ref 80.0–100.0)
Monocytes Absolute: 2.1 10*3/uL — ABNORMAL HIGH (ref 0.1–1.0)
Monocytes Relative: 9 %
Neutro Abs: 14.9 10*3/uL — ABNORMAL HIGH (ref 1.7–7.7)
Neutrophils Relative %: 64 %
Platelets: 207 10*3/uL (ref 150–400)
RBC: 3.41 MIL/uL — ABNORMAL LOW (ref 3.87–5.11)
RDW: 14.5 % (ref 11.5–15.5)
WBC: 23.2 10*3/uL — ABNORMAL HIGH (ref 4.0–10.5)
nRBC: 0.5 % — ABNORMAL HIGH (ref 0.0–0.2)

## 2020-07-16 MED ORDER — PALONOSETRON HCL INJECTION 0.25 MG/5ML
INTRAVENOUS | Status: AC
  Filled 2020-07-16: qty 5

## 2020-07-16 MED ORDER — DOXORUBICIN HCL CHEMO IV INJECTION 2 MG/ML
60.0000 mg/m2 | Freq: Once | INTRAVENOUS | Status: AC
  Administered 2020-07-16: 120 mg via INTRAVENOUS
  Filled 2020-07-16: qty 60

## 2020-07-16 MED ORDER — PROCHLORPERAZINE EDISYLATE 10 MG/2ML IJ SOLN
INTRAMUSCULAR | Status: AC
  Filled 2020-07-16: qty 2

## 2020-07-16 MED ORDER — PROCHLORPERAZINE EDISYLATE 10 MG/2ML IJ SOLN
10.0000 mg | Freq: Once | INTRAMUSCULAR | Status: AC
  Administered 2020-07-16: 10 mg via INTRAVENOUS

## 2020-07-16 MED ORDER — SODIUM CHLORIDE 0.9% FLUSH
10.0000 mL | INTRAVENOUS | Status: DC | PRN
  Administered 2020-07-16: 10 mL
  Filled 2020-07-16: qty 10

## 2020-07-16 MED ORDER — CYCLOPHOSPHAMIDE CHEMO INJECTION 1 GM
600.0000 mg/m2 | Freq: Once | INTRAMUSCULAR | Status: AC
  Administered 2020-07-16: 1200 mg via INTRAVENOUS
  Filled 2020-07-16: qty 60

## 2020-07-16 MED ORDER — SODIUM CHLORIDE 0.9 % IV SOLN
Freq: Once | INTRAVENOUS | Status: AC
  Filled 2020-07-16: qty 250

## 2020-07-16 MED ORDER — SODIUM CHLORIDE 0.9 % IV SOLN
150.0000 mg | Freq: Once | INTRAVENOUS | Status: AC
  Administered 2020-07-16: 150 mg via INTRAVENOUS
  Filled 2020-07-16: qty 150

## 2020-07-16 MED ORDER — HEPARIN SOD (PORK) LOCK FLUSH 100 UNIT/ML IV SOLN
500.0000 [IU] | Freq: Once | INTRAVENOUS | Status: AC | PRN
  Administered 2020-07-16: 500 [IU]
  Filled 2020-07-16: qty 5

## 2020-07-16 MED ORDER — PROCHLORPERAZINE MALEATE 10 MG PO TABS
ORAL_TABLET | ORAL | Status: AC
  Filled 2020-07-16: qty 1

## 2020-07-16 MED ORDER — PALONOSETRON HCL INJECTION 0.25 MG/5ML
0.2500 mg | Freq: Once | INTRAVENOUS | Status: AC
  Administered 2020-07-16: 0.25 mg via INTRAVENOUS

## 2020-07-16 NOTE — Patient Instructions (Signed)
[name]  Discharge Instructions: Thank you for choosing [name] to provide your oncology and hematology care.   If you have a lab appointment with the [name], please go directly to the [name] and check in at the registration area.   Wear comfortable clothing and clothing appropriate for easy access to any Portacath or PICC line.   We strive to give you quality time with your provider. You may need to reschedule your appointment if you arrive late (15 or more minutes).  Arriving late affects you and other patients whose appointments are after yours.  Also, if you miss three or more appointments without notifying the office, you may be dismissed from the clinic at the provider's discretion.      For prescription refill requests, have your pharmacy contact our office and allow 72 hours for refills to be completed.    Today you received the following chemotherapy and/or immunotherapy agents adriamycin, cytoxan      To help prevent nausea and vomiting after your treatment, we encourage you to take your nausea medication as directed.  BELOW ARE SYMPTOMS THAT SHOULD BE REPORTED IMMEDIATELY: *FEVER GREATER THAN 100.4 F (38 C) OR HIGHER *CHILLS OR SWEATING *NAUSEA AND VOMITING THAT IS NOT CONTROLLED WITH YOUR NAUSEA MEDICATION *UNUSUAL SHORTNESS OF BREATH *UNUSUAL BRUISING OR BLEEDING *URINARY PROBLEMS (pain or burning when urinating, or frequent urination) *BOWEL PROBLEMS (unusual diarrhea, constipation, pain near the anus) TENDERNESS IN MOUTH AND THROAT WITH OR WITHOUT PRESENCE OF ULCERS (sore throat, sores in mouth, or a toothache) UNUSUAL RASH, SWELLING OR PAIN  UNUSUAL VAGINAL DISCHARGE OR ITCHING   Items with * indicate a potential emergency and should be followed up as soon as possible or go to the Emergency Department if any problems should occur.  Please show the CHEMOTHERAPY ALERT CARD or IMMUNOTHERAPY ALERT CARD at  check-in to the Emergency Department and triage nurse.  Should you have questions after your visit or need to cancel or reschedule your appointment, please contact [name]  Dept: [phone]  and follow the prompts.  Office hours are 8:00 a.m. to 4:30 p.m. Monday - Friday. Please note that voicemails left after 4:00 p.m. may not be returned until the following business day.  We are closed weekends and major holidays. You have access to a nurse at all times for urgent questions. Please call the main number to the clinic Dept: [phone] and follow the prompts.   For any non-urgent questions, you may also contact your provider using MyChart. We now offer e-Visits for anyone 18 and older to request care online for non-urgent symptoms. For details visit [email or web address].   Also download the MyChart app! Go to the app store, search "MyChart", open the app, select [facility], and log in with your MyChart username and password.  Due to Covid, a mask is required upon entering the hospital/clinic. If you do not have a mask, one will be given to you upon arrival. For doctor visits, patients may have 1 support person aged 18 or older with them. For treatment visits, patients cannot have anyone with them due to current Covid guidelines and our immunocompromised population.   

## 2020-07-16 NOTE — Progress Notes (Signed)
[place] [facility]  [phone] [phone]     ID: Yolanda [name] DOB: 1960-12-19  MR#: [id number]  RKY#:[id number]  Patient Care Team: Mosie Lukes, MD as PCP - General (Family Medicine) Michel Bickers, MD as Consulting Physician (Infectious Diseases) Megan Salon, MD as Consulting Physician (Gynecology) Magrinat, Virgie Dad, MD as Consulting Physician (Oncology) Juanita Craver, MD as Consulting Physician (Gastroenterology) Stark Klein, MD as Consulting Physician (General Surgery) Mauro Kaufmann, RN as Oncology Nurse Navigator Rockwell Germany, RN as Oncology Nurse Navigator Lincoln Brigham, PA-C OTHER MD:   CHIEF COMPLAINT: Estrogen receptor positive breast cancer  CURRENT TREATMENT: Adjuvant chemotherapy   INTERVAL HISTORY: Yolanda [name] returns today for follow up and treatment of her estrogen receptor positive breast cancer.  She began adjuvant chemotherapy on 06/18/2020, consisting of cyclophosphamide and doxorubicin in dose dense fashion with pegfilgrastim on day 3.  Today is Cycle 3 Day 1. She is unaccompanied for this visit. She reports that her energy levels are overall stable. When she does have fatigue, she is able to rest with improvement of her energy levels. She has a good appetite without any weight changes. She denies any nausea, vomiting or abdominal pain. Her bowel movements are regular without any constipation or diarrhea. Patient has noticed some burning in the left eye with mild blurriness. She denies discharge, redness or blisters around the left eye.   Patient was started on 7 day course of doxycycline for recurrent hematoma at the surgical site. She will be due to finish her antibiotics this Thursday, 07/18/2020.    Patient denies any fevers, chills, night sweats, shortness of breath, chest pain or cough. She has no other complaints.    REVIEW OF SYSTEMS: Review of Systems  Constitutional: Negative for chills, fever and weight loss.   HENT: Negative for congestion, nosebleeds and sore throat.   Eyes: Positive for blurred vision (mild on left eye with burning sensation). Negative for double vision, pain, discharge and redness.  Respiratory: Negative for cough, hemoptysis, shortness of breath and wheezing.   Cardiovascular: Negative for chest pain, palpitations and leg swelling.  Gastrointestinal: Negative for abdominal pain, blood in stool, constipation, diarrhea, melena, nausea and vomiting.  Genitourinary: Negative for dysuria.  Musculoskeletal: Positive for back pain (pain presents after pegfilgastrim injection). Negative for falls and neck pain.  Skin: Negative for itching and rash.  Neurological: Negative for dizziness, tingling, weakness and headaches.  Endo/Heme/Allergies: Does not bruise/bleed easily.  Psychiatric/Behavioral: Negative.     COVID 19 VACCINATION STATUS: fully vaccinated [employer]), with booster 12/2019   HISTORY OF CURRENT ILLNESS: From the original intake note:   I saw Yolanda [name] in December 2018 for evaluation of atypical lobular hyperplasia.  We discussed various strategies regarding breast cancer prevention at that time for her to consider.    More recently (around the time of the Super Bowl 2022) she found a lump in her right breast.  She underwent bilateral diagnostic mammography with tomography and right breast ultrasonography at [facility] on 04/10/2020 showing: breast density category C; palpable 3 cm mass in right breast at 12 o'clock; no enlarged adenopathy in right axilla.  Accordingly on the same day, she proceeded to biopsy of the right breast area in question. The pathology from this procedure ([id number]) showed: invasive ductal carcinoma, grade 3; ductal carcinoma in situ. Prognostic indicators significant for: estrogen receptor, 90% positive with moderate staining intensity and progesterone receptor, 0% negative. Proliferation marker Ki67 at 25%. HER2 equivocal by  immunohistochemistry (2+), but negative by fluorescent in situ hybridization with a signals ratio 1.49 and number per cell 2.75.  Cancer Staging Malignant neoplasm of upper-outer quadrant of right breast in female, estrogen receptor positive ([place]) Staging form: Breast, AJCC 8th Edition - Clinical: Stage IIA (cT2, cN0, cM0, G3, ER+, PR+, HER2-) - Signed by Chauncey Cruel, MD on 04/18/2020  The patient's subsequent history is as detailed below.   PAST MEDICAL HISTORY: Past Medical History:  Diagnosis Date  . Asthma    with respiratory infection  . Cancer [facility])    breast cancer - right  . Diabetes mellitus without complication ([place])    type II  . Fatty liver   . Fibroid uterus   . Hypertension    Patient reports that she has never been diagnosied with HTN, "I take HCTZ for swelling in my feet, if needed."  . Osteoarthritis 03/10/2015  . PONV (postoperative nausea and vomiting)    2014  . Refusal of blood transfusions as patient is Jehovah's Witness    NO BLOOD PRODUCTS  . Sleep apnea     PAST SURGICAL HISTORY: Past Surgical History:  Procedure Laterality Date  . BREAST CYST ASPIRATION    . BREAST EXCISIONAL BIOPSY    . BREAST LUMPECTOMY WITH SENTINEL LYMPH NODE BIOPSY Right 05/21/2020   Procedure: RIGHT BREAST LUMPECTOMY WITH SENTINEL LYMPH NODE BIOPSY;  Surgeon: Stark Klein, MD;  Location: [place];  Service: General;  Laterality: Right;  . CHOLECYSTECTOMY  1/11  . COLONOSCOPY    . DILATATION & CURRETTAGE/HYSTEROSCOPY WITH RESECTOCOPE N/A 03/13/2014   Procedure: DILATATION & CURETTAGE/HYSTEROSCOPY ;  Surgeon: Lyman Speller, MD;  Location: [facility];  Service: Gynecology;  Laterality: N/A;  . PORTACATH PLACEMENT Left 05/21/2020   Procedure: INSERTION PORT-A-CATH;  Surgeon: Stark Klein, MD;  Location: [place];  Service: General;  Laterality: Left;  . RADIOACTIVE SEED GUIDED EXCISIONAL BREAST BIOPSY Left 12/30/2016   Procedure: RADIOACTIVE SEED GUIDED EXCISIONAL BREAST BIOPSY;   Surgeon: Stark Klein, MD;  Location: [place];  Service: General;  Laterality: Left;    FAMILY HISTORY: Family History  Problem Relation Age of Onset  . Asthma Mother   . Diabetes Mother   . Diabetes Father   . Heart attack Father   . Diabetes Brother   . Diabetes Brother   . Asthma Brother   . Heart Problems Sister   . Asthma Sister   . Hypertension Sister   . Heart disease Maternal Grandfather   . Osteopenia Sister   The patient's father died from heart disease at the age of 2.  The patient's mother is 61 years old as of March 2022.  The patient has 4 brothers and 2 sisters.  She is not aware of any history of cancer in her family.   GYNECOLOGIC HISTORY:  Patient's last menstrual period was 02/21/2014. Menarche: 60 years old GX P 0 LMP age 72 Contraceptive 1 year, no complications HRT no  Hysterectomy? no BSO? 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  Her husband [name] works as a Marketing executive for [employer].  He has 2 children from a prior marriage, both living in [place].  One is traveling nurse.  The other 1 is a Administrator.  The patient is a Restaurant manager, fast food.    ADVANCED DIRECTIVES: In the absence of any documentation to the contrary, the patient's spouse is their HCPOA.  The patient made it clear at her visit  04/18/2020 that she would not accept any blood transfusion or blood products for any reason including to stave off death   HEALTH MAINTENANCE: Social History   Tobacco Use  . Smoking status: Never Smoker  . Smokeless tobacco: Never Used  Vaping Use  . Vaping Use: Never used  Substance Use Topics  . Alcohol use: No  . Drug use: No     Colonoscopy: 10/2019 (Dr. Collene Mares), recall 2026  PAP: 03/2017, negative  Bone density: 09/2018, -0.8   Allergies  Allergen Reactions  . Lisinopril Shortness Of Breath and Cough    wheezing    Current Outpatient Medications  Medication Sig Dispense Refill  .  albuterol (PROVENTIL HFA;VENTOLIN HFA) 108 (90 Base) MCG/ACT inhaler Inhale 1-2 puffs into the lungs every 4 (four) hours as needed for wheezing or shortness of breath. 18 g 2  . atorvastatin (LIPITOR) 10 MG tablet Take 10 mg by mouth every other day.    . b complex vitamins capsule Take 1 capsule by mouth every other day.    . B-D ULTRA-FINE 33 LANCETS MISC Check BG 2 times/day    . Carboxymethylcellul-Glycerin (REFRESH OPTIVE OP) Place 1 drop into both eyes 3 (three) times daily as needed (dry / irritated eyes).    . cholecalciferol (VITAMIN D3) 25 MCG (1000 UNIT) tablet Take 1,000 Units by mouth every other day.    . ciprofloxacin (CIPRO) 500 MG tablet Take 1 tablet (500 mg total) by mouth 2 (two) times daily. Take for 5 days, then stop. Repeat May 24, June 7 and June 21 40 tablet 1  . doxycycline (VIBRA-TABS) 100 MG tablet Take 100 mg by mouth 2 (two) times daily.    . fluticasone (FLONASE) 50 MCG/ACT nasal spray Place 1 spray into both nostrils daily as needed for allergies or rhinitis.    Marland Kitchen glucose blood test strip Check BG 2 times/day.  Dx E11.65    . hydrochlorothiazide (HYDRODIURIL) 25 MG tablet Take 25 mg by mouth every other day.    . hydrocortisone cream 1 % Apply 1 application topically daily as needed for itching.    . lidocaine-prilocaine (EMLA) cream Apply to affected area once 30 g 3  . loratadine (CLARITIN) 10 MG tablet Take 1 tablet (10 mg total) by mouth daily. 90 tablet 0  . magnesium oxide (MAG-OX) 400 MG tablet Take 400 mg by mouth every other day.    . metFORMIN (GLUCOPHAGE-XR) 500 MG 24 hr tablet Take 2 tablets (1,000 mg total) by mouth 2 (two) times daily. 120 tablet 3  . milk thistle 175 MG tablet Take 175 mg by mouth every other day.    . Omega-3 Fatty Acids (OMEGA 3 PO) Take 1 capsule by mouth every other day.    Marland Kitchen omeprazole (PRILOSEC) 40 MG capsule Take 40 mg by mouth daily as needed (acid reflux).    Marland Kitchen oxyCODONE (OXY IR/ROXICODONE) 5 MG immediate release tablet Take  1 tablet (5 mg total) by mouth every 6 (six) hours as needed for severe pain. 10 tablet 0  . prochlorperazine (COMPAZINE) 10 MG tablet Take 1 tablet (10 mg total) by mouth every 6 (six) hours as needed (Nausea or vomiting). 30 tablet 1  . prochlorperazine (COMPAZINE) 5 MG tablet Take 1 tablet (5 mg total) by mouth every 6 (six) hours as needed for nausea or vomiting. 30 tablet 0  . SOYA LECITHIN PO Take 1 capsule by mouth every other day.    . valACYclovir (VALTREX) 500 MG tablet Take 1  tablet (500 mg total) by mouth daily. 60 tablet 1  . vitamin C (ASCORBIC ACID) 500 MG tablet Take 500 mg by mouth every other day.     No current facility-administered medications for this visit.   Facility-Administered Medications Ordered in Other Visits  Medication Dose Route Frequency Provider Last Rate Last Admin  . cyclophosphamide (CYTOXAN) 1,200 mg in sodium chloride 0.9 % 250 mL chemo infusion  600 mg/m2 (Treatment Plan Recorded) Intravenous Once Magrinat, Virgie Dad, MD      . DOXOrubicin (ADRIAMYCIN) chemo injection 120 mg  60 mg/m2 (Treatment Plan Recorded) Intravenous Once Magrinat, Virgie Dad, MD      . fosaprepitant (EMEND) 150 mg in sodium chloride 0.9 % 145 mL IVPB  150 mg Intravenous Once Magrinat, Virgie Dad, MD      . heparin lock flush 100 unit/mL  500 Units Intracatheter Once PRN Magrinat, Virgie Dad, MD      . sodium chloride flush (NS) 0.9 % injection 10 mL  10 mL Intracatheter PRN Magrinat, Virgie Dad, MD       SUPPLEMENTS: Aside from the medications listed above, the patient takes a teaspoon of olive oil daily, 1000 mg of vitamin C every other day, sunflower lecithin 600 mg every other day, vitamin D3 5000 mg every other day, magnesium 200 mg every other day, milk thistle 150 mg every other day, a B complex vitamin (B6 and B12) every other day, as well as Osteo Bi-Flex weekly and fluticasone as needed  OBJECTIVE: Middle-aged woman in no acute distress  Vitals:   07/16/20 0908  BP: 115/75  Pulse:  (!) 106  Resp: 18  Temp: 97.9 F (36.6 C)  SpO2: 99%     Body mass index is 28.19 kg/m.   Wt Readings from Last 3 Encounters:  07/16/20 180 lb (81.6 kg)  07/02/20 180 lb 6.4 oz (81.8 kg)  06/25/20 182 lb 14.4 oz (83 kg)   ECOG FS:1 - Symptomatic but completely ambulatory  Sclerae unicteric, EOMs intact, no purulent discharge, no erythema, no blisters or lesions.  No cervical or supraclavicular adenopathy Lungs no rales or rhonchi Heart regular rate and rhythm Abd soft, nontender, positive bowel sounds MSK no focal spinal tenderness, no upper extremity lymphedema Neuro: nonfocal, well oriented, appropriate affect; walk appears normal.     LAB RESULTS:  CMP     Component Value Date/Time   NA 137 07/16/2020 0852   NA 138 11/04/2017 0000   K 4.1 07/16/2020 0852   CL 97 (L) 07/16/2020 0852   CO2 26 07/16/2020 0852   GLUCOSE 156 (H) 07/16/2020 0852   BUN 11 07/16/2020 0852   BUN 14 11/04/2017 0000   CREATININE 0.80 07/16/2020 0852   CREATININE 0.81 04/18/2020 1527   CREATININE 0.60 09/07/2014 0950   CALCIUM 10.4 (H) 07/16/2020 0852   PROT 7.1 07/16/2020 0852   ALBUMIN 3.5 07/16/2020 0852   AST 17 07/16/2020 0852   AST 26 04/18/2020 1527   ALT 17 07/16/2020 0852   ALT 37 04/18/2020 1527   ALKPHOS 157 (H) 07/16/2020 0852   BILITOT <0.2 (L) 07/16/2020 0852   BILITOT 0.3 04/18/2020 1527   GFRNONAA >60 07/16/2020 0852   GFRNONAA >60 04/18/2020 1527   GFRAA >60 05/15/2019 1803    No results found for: TOTALPROTELP, ALBUMINELP, A1GS, A2GS, BETS, BETA2SER, GAMS, MSPIKE, SPEI  Lab Results  Component Value Date   WBC 23.2 (H) 07/16/2020   NEUTROABS 14.9 (H) 07/16/2020   HGB 10.1 (L) 07/16/2020  HCT 30.6 (L) 07/16/2020   MCV 89.7 07/16/2020   PLT 207 07/16/2020    No results found for: LABCA2  No components found for: [id number]  No results for input(s): INR in the last 168 hours.  No results found for: LABCA2  No results found for: TJQ300  No results found  for: PQZ300  No results found for: TMA263  No results found for: CA2729  No components found for: HGQUANT  No results found for: CEA1 / No results found for: CEA1   No results found for: AFPTUMOR  No results found for: CHROMOGRNA  No results found for: KPAFRELGTCHN, LAMBDASER, KAPLAMBRATIO (kappa/lambda light chains)  No results found for: HGBA, HGBA2QUANT, HGBFQUANT, HGBSQUAN (Hemoglobinopathy evaluation)   No results found for: LDH  Lab Results  Component Value Date   IRON 38 (L) 10/16/2009   IRONPCTSAT 10.2 (L) 10/16/2009   (Iron and TIBC)  Lab Results  Component Value Date   FERRITIN 8.9 (L) 10/16/2009    Urinalysis    Component Value Date/Time   COLORURINE YELLOW 03/11/2009 1100   APPEARANCEUR CLEAR 03/11/2009 1100   LABSPEC 1.017 03/11/2009 1100   PHURINE 7.0 03/11/2009 1100   GLUCOSEU NEGATIVE 03/11/2009 1100   HGBUR LARGE (A) 03/11/2009 1100   BILIRUBINUR N 05/21/2015 1614   KETONESUR NEGATIVE 03/11/2009 1100   PROTEINUR N 05/21/2015 1614   PROTEINUR NEGATIVE 03/11/2009 1100   UROBILINOGEN negative 05/21/2015 1614   UROBILINOGEN 0.2 03/11/2009 1100   NITRITE N 05/21/2015 1614   NITRITE NEGATIVE 03/11/2009 1100   LEUKOCYTESUR Negative 05/21/2015 1614    STUDIES: No results found.   ELIGIBLE FOR AVAILABLE RESEARCH PROTOCOL: no  ASSESSMENT: 60 y.o. [place] woman status post right breast upper outer quadrant biopsy 04/10/2020 for a clinical T2N0, stage IIA invasive ductal carcinoma, grade 3, estrogen receptor positive, progesterone receptor and HER-2 negative, with an MIB-1 of 25%.  (1) Oncotype obtained from the original biopsy shows a score of 48, predicting a recurrence rate outside the breast within 9 years of 28% if the only systemic therapy is antiestrogen for 5 years.  It also predicts a greater than 15% benefit from chemotherapy.  (2) right lumpectomy and sentinel lymph node dissection 05/21/2020 shows a pT2 pN0, stage IIB invasive ductal  carcinoma, grade 3, with negative margins.  (3) adjuvant chemotherapy to consist of cyclophosphamide and doxorubicin in dose dense fashion x4 starting 06/18/2020, with Neulasta day 3; to be followed by paclitaxel weekly x12.  (a) echo 05/13/2020 shows an ejection fraction in the 55-60% range  (4) adjuvant radiation  (5) antiestrogens.   PLAN: Yolanda [name] presents today for Cycle 3 Day 1 of adjuvant adriamycin and cytoxan. Labs from today were reviewed without any intervention needed. Patient will proceed with treatment as planned including Day 3 with pegfilgrastim. Patient will return to the clinic on 07/30/2020 prior to Cycle 4 Day 1.  Patient will continue on Doxycycline and finish on 07/18/2020 for recurrent hematoma. She will follow up with Dr. Barry Dienes.  I advised patient to try OTC lubricating eye drops. If eye discomfort worsens or she develops new symptoms including discharge, vision loss or lesions/blisters; she is aware to follow up with the clinic.   I have spent a total of 35 minutes minutes of face-to-face and non-face-to-face time, preparing to see the patient, obtaining and/or reviewing separately obtained history, performing a medically appropriate examination, counseling and educating the patient, documenting clinical information in the electronic health record, and care coordination.    Joyice Faster  Debbe Odea 07/16/2020 10:08 AM Medical Oncology and Hematology [facility] [place], [place] [address]  Patient expressed understanding and satisfaction with the plan provided.

## 2020-07-18 ENCOUNTER — Other Ambulatory Visit: Payer: Self-pay

## 2020-07-18 ENCOUNTER — Inpatient Hospital Stay: Payer: 59 | Attending: Oncology

## 2020-07-18 VITALS — BP 135/82 | HR 97 | Temp 98.5°F | Resp 16

## 2020-07-18 DIAGNOSIS — C50411 Malignant neoplasm of upper-outer quadrant of right female breast: Secondary | ICD-10-CM | POA: Diagnosis present

## 2020-07-18 DIAGNOSIS — R0602 Shortness of breath: Secondary | ICD-10-CM | POA: Diagnosis not present

## 2020-07-18 DIAGNOSIS — R059 Cough, unspecified: Secondary | ICD-10-CM | POA: Diagnosis not present

## 2020-07-18 DIAGNOSIS — Z5189 Encounter for other specified aftercare: Secondary | ICD-10-CM | POA: Insufficient documentation

## 2020-07-18 DIAGNOSIS — Z170 Estrogen receptor positive status [ER+]: Secondary | ICD-10-CM | POA: Diagnosis not present

## 2020-07-18 DIAGNOSIS — R5383 Other fatigue: Secondary | ICD-10-CM | POA: Insufficient documentation

## 2020-07-18 DIAGNOSIS — Z5111 Encounter for antineoplastic chemotherapy: Secondary | ICD-10-CM | POA: Diagnosis present

## 2020-07-18 DIAGNOSIS — D649 Anemia, unspecified: Secondary | ICD-10-CM | POA: Diagnosis not present

## 2020-07-18 MED ORDER — PEGFILGRASTIM-BMEZ 6 MG/0.6ML ~~LOC~~ SOSY
PREFILLED_SYRINGE | SUBCUTANEOUS | Status: AC
  Filled 2020-07-18: qty 0.6

## 2020-07-18 MED ORDER — PEGFILGRASTIM-BMEZ 6 MG/0.6ML ~~LOC~~ SOSY
6.0000 mg | PREFILLED_SYRINGE | Freq: Once | SUBCUTANEOUS | Status: AC
  Administered 2020-07-18: 6 mg via SUBCUTANEOUS

## 2020-07-18 NOTE — Patient Instructions (Signed)
Pegfilgrastim injection What is this medicine? PEGFILGRASTIM (PEG fil gra stim) is a long-acting granulocyte colony-stimulating factor that stimulates the growth of neutrophils, a type of white blood cell important in the body's fight against infection. It is used to reduce the incidence of fever and infection in patients with certain types of cancer who are receiving chemotherapy that affects the bone marrow, and to increase survival after being exposed to high doses of radiation. This medicine may be used for other purposes; ask your health care provider or pharmacist if you have questions. COMMON BRAND NAME(S): Fulphila, Neulasta, Nyvepria, UDENYCA, Ziextenzo What should I tell my health care provider before I take this medicine? They need to know if you have any of these conditions:  kidney disease  latex allergy  ongoing radiation therapy  sickle cell disease  skin reactions to acrylic adhesives (On-Body Injector only)  an unusual or allergic reaction to pegfilgrastim, filgrastim, other medicines, foods, dyes, or preservatives  pregnant or trying to get pregnant  breast-feeding How should I use this medicine? This medicine is for injection under the skin. If you get this medicine at home, you will be taught how to prepare and give the pre-filled syringe or how to use the On-body Injector. Refer to the patient Instructions for Use for detailed instructions. Use exactly as directed. Tell your healthcare provider immediately if you suspect that the On-body Injector may not have performed as intended or if you suspect the use of the On-body Injector resulted in a missed or partial dose. It is important that you put your used needles and syringes in a special sharps container. Do not put them in a trash can. If you do not have a sharps container, call your pharmacist or healthcare provider to get one. Talk to your pediatrician regarding the use of this medicine in children. While this drug  may be prescribed for selected conditions, precautions do apply. Overdosage: If you think you have taken too much of this medicine contact a poison control center or emergency room at once. NOTE: This medicine is only for you. Do not share this medicine with others. What if I miss a dose? It is important not to miss your dose. Call your doctor or health care professional if you miss your dose. If you miss a dose due to an On-body Injector failure or leakage, a new dose should be administered as soon as possible using a single prefilled syringe for manual use. What may interact with this medicine? Interactions have not been studied. This list may not describe all possible interactions. Give your health care provider a list of all the medicines, herbs, non-prescription drugs, or dietary supplements you use. Also tell them if you smoke, drink alcohol, or use illegal drugs. Some items may interact with your medicine. What should I watch for while using this medicine? Your condition will be monitored carefully while you are receiving this medicine. You may need blood work done while you are taking this medicine. Talk to your health care provider about your risk of cancer. You may be more at risk for certain types of cancer if you take this medicine. If you are going to need a MRI, CT scan, or other procedure, tell your doctor that you are using this medicine (On-Body Injector only). What side effects may I notice from receiving this medicine? Side effects that you should report to your doctor or health care professional as soon as possible:  allergic reactions (skin rash, itching or hives, swelling of   the face, lips, or tongue)  back pain  dizziness  fever  pain, redness, or irritation at site where injected  pinpoint red spots on the skin  red or dark-brown urine  shortness of breath or breathing problems  stomach or side pain, or pain at the shoulder  swelling  tiredness  trouble  passing urine or change in the amount of urine  unusual bruising or bleeding Side effects that usually do not require medical attention (report to your doctor or health care professional if they continue or are bothersome):  bone pain  muscle pain This list may not describe all possible side effects. Call your doctor for medical advice about side effects. You may report side effects to FDA at 1-800-FDA-1088. Where should I keep my medicine? Keep out of the reach of children. If you are using this medicine at home, you will be instructed on how to store it. Throw away any unused medicine after the expiration date on the label. NOTE: This sheet is a summary. It may not cover all possible information. If you have questions about this medicine, talk to your doctor, pharmacist, or health care provider.  2021 Elsevier/Gold Standard (2019-02-24 13:20:51)  

## 2020-07-29 ENCOUNTER — Ambulatory Visit (HOSPITAL_BASED_OUTPATIENT_CLINIC_OR_DEPARTMENT_OTHER): Payer: 59 | Admitting: Obstetrics & Gynecology

## 2020-07-29 NOTE — Progress Notes (Signed)
[name] OFFICE PROGRESS NOTE  Yolanda Lukes, [place] [id number]  DIAGNOSIS: Estrogen receptor positive breast cancer  CURRENT THERAPY: Adjuvant chemotherapy. She began adjuvant chemotherapy on 06/18/2020, consisting of cyclophosphamide and doxorubicin in dose dense fashion with pegfilgrastim on day 3.  Today is Cycle 4 Day 1.  INTERVAL HISTORY: Yolanda Yolanda [name] 60 y.o. female returns today for a follow up and treatment of her estrogen receptor positive breast cancer. She reports that her energy levels are fair but she has had some fatigue and decreased exercise tolerance/dyspnea on exertion which improves with resting. She is a little more anemic than prior. She has a good appetite without any weight changes. She denies any nausea, vomiting or abdominal pain. Her bowel movements are regular without any constipation or diarrhea. Yolanda [name] denies any fevers, chills, night sweats, chest pain or cough. She denies swelling. She also mentions she had a few days where she had some pain bilaterally in her heels that spread to Yolanda bottom of her foot. She denies numbness or tingling in her fingers or toes. This lasted a few days and this resolved without intervention. She describes Yolanda pain as sharp. She reportedly has a self diagnosed history of plantar fasciitis. Her symptoms have resolved at this time. She denies any signs or symptoms of infection except she has been seeing her surgeon for a hematoma for which she is on doxycycline presently. She is here for evaluation before starting cycle #4.    HISTORY OF CURRENT ILLNESS: From Yolanda original intake note:   I saw Yolanda Yolanda [name] in December 2018 for evaluation of atypical lobular hyperplasia.  We discussed various strategies regarding breast cancer prevention at that time for her to consider.    More recently (around Yolanda time of Yolanda [employer] Bowl 2022) she found a lump in her right breast.  She underwent  bilateral diagnostic mammography with tomography and right breast ultrasonography at [name] on 04/10/2020 showing: breast density category C; palpable 3 cm mass in right breast at 12 o'clock; no enlarged adenopathy in right axilla.   Accordingly on Yolanda same day, she proceeded to biopsy of Yolanda right breast area in question. Yolanda pathology from this procedure ([facility]olanda Y[name]) showed: invasive ductal carcinoma, grade 3; ductal carcinoma in situ. Prognostic indicators significant for: estrogen receptor, 90% positive with moderate staining intensity and progesterone receptor, 0% negative. Proliferation marker Ki67 at 25%. HER2 equivocal by immunohistochemistry (2+), but negative by fluorescent in situ hybridization with a signals ratio 1.49 and number per cell 2.75.     MEDICAL HISTORY: Past Medical History:  Diagnosis Date   Asthma    with respiratory infection   Cancer ([name])    breast cancer - right   Diabetes mellitus without complication ([place])    type II   Fatty liver    Fibroid uterus    Hypertension    Yolanda [name] reports that she has never been diagnosied with HTN, "I take HCTZ for swelling in my feet, if needed."   Osteoarthritis 03/10/2015   PONV (postoperative nausea and vomiting)    2014   Refusal of blood transfusions as Yolanda [name] is Yolanda Yolanda [name]    NO BLOOD PRODUCTS   Sleep apnea     ALLERGIES:  is allergic to lisinopril.  MEDICATIONS:  Current Outpatient Medications  Medication Sig Dispense Refill   albuterol (PROVENTIL HFA;VENTOLIN HFA) 108 (90 Base) MCG/ACT inhaler Inhale 1-2 puffs into Yolanda lungs every 4 (four) hours  as needed for wheezing or shortness of breath. 18 g 2   atorvastatin (LIPITOR) 10 MG tablet Take 10 mg by mouth every other day.     b complex vitamins capsule Take 1 capsule by mouth every other day.     B-D ULTRA-FINE 33 LANCETS MISC Check BG 2 times/day     Carboxymethylcellul-Glycerin (REFRESH OPTIVE OP) Place 1 drop into both eyes 3 (three) times  daily as needed (dry / irritated eyes).     cholecalciferol (VITAMIN D3) 25 MCG (1000 UNIT) tablet Take 1,000 Units by mouth every other day.     ciprofloxacin (CIPRO) 500 MG tablet Take 1 tablet (500 mg total) by mouth 2 (two) times daily. Take for 5 days, then stop. Repeat May 24, June 7 and June 21 40 tablet 1   doxycycline (VIBRA-TABS) 100 MG tablet Take 100 mg by mouth 2 (two) times daily.     fluticasone (FLONASE) 50 MCG/ACT nasal spray Place 1 spray into both nostrils daily as needed for allergies or rhinitis.     glucose blood test strip Check BG 2 times/day.  Dx E11.65     hydrochlorothiazide (HYDRODIURIL) 25 MG tablet Take 25 mg by mouth every other day.     hydrocortisone cream 1 % Apply 1 application topically daily as needed for itching.     lidocaine-prilocaine (EMLA) cream Apply to affected area once 30 g 3   loratadine (CLARITIN) 10 MG tablet Take 1 tablet (10 mg total) by mouth daily. 90 tablet 0   magnesium oxide (MAG-OX) 400 MG tablet Take 400 mg by mouth every other day.     metFORMIN (GLUCOPHAGE-XR) 500 MG 24 hr tablet Take 2 tablets (1,000 mg total) by mouth 2 (two) times daily. 120 tablet 3   milk thistle 175 MG tablet Take 175 mg by mouth every other day.     Omega-3 Fatty Acids (OMEGA 3 PO) Take 1 capsule by mouth every other day.     omeprazole (PRILOSEC) 40 MG capsule Take 40 mg by mouth daily as needed (acid reflux).     oxyCODONE (OXY IR/ROXICODONE) 5 MG immediate release tablet Take 1 tablet (5 mg total) by mouth every 6 (six) hours as needed for severe pain. 10 tablet 0   prochlorperazine (COMPAZINE) 5 MG tablet Take 1 tablet (5 mg total) by mouth every 6 (six) hours as needed for nausea or vomiting. 30 tablet 0   SOYA LECITHIN PO Take 1 capsule by mouth every other day.     valACYclovir (VALTREX) 500 MG tablet Take 1 tablet (500 mg total) by mouth daily. 60 tablet 1   vitamin C (ASCORBIC ACID) 500 MG tablet Take 500 mg by mouth every other day.     No current  facility-administered medications for this visit.   Facility-Administered Medications Ordered in Other Visits  Medication Dose Route Frequency Provider Last Rate Last Admin   sodium chloride flush (NS) 0.9 % injection 10 mL  10 mL Intracatheter PRN Magrinat, Virgie Dad, MD   10 mL at 07/30/20 1236    SURGICAL HISTORY:  Past Surgical History:  Procedure Laterality Date   BREAST CYST ASPIRATION     BREAST EXCISIONAL BIOPSY     BREAST LUMPECTOMY WITH SENTINEL LYMPH NODE BIOPSY Right 05/21/2020   Procedure: RIGHT BREAST LUMPECTOMY WITH SENTINEL LYMPH NODE BIOPSY;  Surgeon: Stark Klein, MD;  Location: [facility];  Service: General;  Laterality: Right;   CHOLECYSTECTOMY  1/11   COLONOSCOPY     DILATATION &  CURRETTAGE/HYSTEROSCOPY WITH RESECTOCOPE N/A 03/13/2014   Procedure: DILATATION & CURETTAGE/HYSTEROSCOPY ;  Surgeon: Lyman Speller, MD;  Location: [facility];  Service: Gynecology;  Laterality: N/A;   PORTACATH PLACEMENT Left 05/21/2020   Procedure: INSERTION PORT-A-CATH;  Surgeon: Stark Klein, MD;  Location: [facility];  Service: General;  Laterality: Left;   RADIOACTIVE SEED GUIDED EXCISIONAL BREAST BIOPSY Left 12/30/2016   Procedure: RADIOACTIVE SEED GUIDED EXCISIONAL BREAST BIOPSY;  Surgeon: Stark Klein, MD;  Location: [facility];  Service: General;  Laterality: Left;    REVIEW OF SYSTEMS:   Review of Systems  Constitutional: Positive for fatigue. Negative for appetite change, chills, fever and unexpected weight change.  HENT: Negative for mouth sores, nosebleeds, sore throat and trouble swallowing.   Eyes: Negative for eye problems and icterus.  Respiratory: Negative for cough, hemoptysis, shortness of breath and wheezing.   Cardiovascular: Negative for chest pain and leg swelling.  Gastrointestinal: Negative for abdominal pain, constipation, diarrhea, nausea and vomiting.  Genitourinary: Negative for bladder incontinence, difficulty urinating, dysuria, frequency and hematuria.    Musculoskeletal: Negative for back pain, gait problem, neck pain and neck stiffness.  Skin: Negative for itching and rash.  Neurological: Negative for dizziness, extremity weakness, gait problem, headaches, light-headedness and seizures.  Hematological: Negative for adenopathy. Does not bruise/bleed easily.  Psychiatric/Behavioral: Negative for confusion, depression and sleep disturbance. Yolanda Yolanda [name] is not nervous/anxious.     PHYSICAL EXAMINATION:  Blood pressure 133/78, pulse 98, temperature 97.7 F (36.5 C), temperature source Tympanic, resp. rate 18, height _0  (1.702 m), weight 182 lb 1.6 oz (82.6 kg), last menstrual period 02/21/2014, SpO2 100 %.  ECOG PERFORMANCE STATUS: 1  Physical Exam  Constitutional: Oriented to person, place, and time and well-developed, well-nourished, and in no distress.  HENT:  Head: Normocephalic and atraumatic.  Mouth/Throat: Oropharynx is clear and moist. No oropharyngeal exudate.  Eyes: Conjunctivae are normal. Right eye exhibits no discharge. Left eye exhibits no discharge. No scleral icterus.  Neck: Normal range of motion. Neck supple.  Cardiovascular: Normal rate, regular rhythm, normal heart sounds and intact distal pulses.   Pulmonary/Chest: Effort normal and breath sounds normal. No respiratory distress. No wheezes. No rales.  Abdominal: Soft. Bowel sounds are normal. Exhibits no distension and no mass. There is no tenderness.  Musculoskeletal: Normal range of motion. Exhibits no edema.  Lymphadenopathy:    No cervical adenopathy.  Neurological: Alert and oriented to person, place, and time. Exhibits normal muscle tone. Gait normal. Coordination normal.  Skin: Skin is warm and dry. No rash noted. Not diaphoretic. No erythema. No pallor.  Psychiatric: Mood, memory and judgment normal.  Breast: Surgical incision on right breast.  Vitals reviewed.  LABORATORY DATA: Lab Results  Component Value Date   WBC 8.6 07/30/2020   HGB 9.1 (L)  07/30/2020   HCT 28.3 (L) 07/30/2020   MCV 92.8 07/30/2020   PLT 333 07/30/2020      Chemistry      Component Value Date/Time   NA 138 07/30/2020 0844   NA 138 11/04/2017 0000   K 4.1 07/30/2020 0844   CL 102 07/30/2020 0844   CO2 27 07/30/2020 0844   BUN 7 (L) 07/30/2020 0844   BUN 14 11/04/2017 0000   CREATININE 0.64 07/30/2020 0844   CREATININE 0.81 04/18/2020 1527   CREATININE 0.60 09/07/2014 0950   GLU 141 11/04/2017 0000      Component Value Date/Time   CALCIUM 10.5 (H) 07/30/2020 0844   ALKPHOS 128 (H) 07/30/2020  0844   AST 16 07/30/2020 0844   AST 26 04/18/2020 1527   ALT 14 07/30/2020 0844   ALT 37 04/18/2020 1527   BILITOT <0.2 (L) 07/30/2020 0844   BILITOT 0.3 04/18/2020 1527       RADIOGRAPHIC STUDIES:  No results found.   ASSESSMENT/PLAN:  60 y.o. [place] woman status post right breast upper outer quadrant biopsy 04/10/2020 for a clinical T2N0, stage IIA invasive ductal carcinoma, grade 3, estrogen receptor positive, progesterone receptor and HER-2 negative, with an MIB-1 of 25%.   (1) Oncotype obtained from Yolanda original biopsy shows a score of 48, predicting a recurrence rate outside Yolanda breast within 9 years of 28% if Yolanda only systemic therapy is antiestrogen for 5 years.  It also predicts a greater than 15% benefit from chemotherapy.   (2) right lumpectomy and sentinel lymph node dissection 05/21/2020 shows a pT2 pN0, stage IIB invasive ductal carcinoma, grade 3, with negative margins.   (3) adjuvant chemotherapy to consist of cyclophosphamide and doxorubicin in dose dense fashion x4 starting 06/18/2020, with Neulasta day 3; to be followed by paclitaxel weekly x12.             (a) echo 05/13/2020 shows an ejection fraction in Yolanda 55-60% range   (4) adjuvant radiation   (5) antiestrogens.    PLAN: Yolanda Yolanda [name] presents today for Cycle 4 Day 1 of adjuvant adriamycin and cytoxan. Labs from today were reviewed without any intervention needed.  She is a little more anemic today which I discussed with her can cause more fatigue, decreased exercise tolerance, and dyspnea on exertion. She has these symptoms which improve with rest. Discussed no intervention needed at this time but we will continue to monitor her labs closely. Recommend that she proceed with cycle #4 today as scheduled. She will return on 6/16 for pegfilgrastim. Yolanda Yolanda [name] will return next week for a follow up with Dr. Jana Hakim. She will check with Dr. Jana Hakim next week when she is due for her repeat echocardiogram.   Regarding her incident of pain on Yolanda heels of her foot/plantar surface of her foot. This has completely resolved at this time. I advised her to monitor her symptoms for now. She denies numbness and tingling in her hands and toes.   Her calcium is a little high today at 10.5. No intervention needed but she was advised to decrease her intake of calcium rich foods and additional supplements until her next lab draw next week when it can be rechecked.   Yolanda Yolanda [name] was advised to call immediately if she has any concerning symptoms in Yolanda interval. Yolanda Yolanda [name] voices understanding of current disease status and treatment options and is in agreement with Yolanda current care plan. All questions were answered. Yolanda Yolanda [name] knows to call Yolanda clinic with any problems, questions or concerns. We can certainly see Yolanda Yolanda [name] much sooner if necessary     No orders of Yolanda defined types were placed in this encounter.     Yolanda total time spent in Yolanda appointment was 20-29 minutes in this encounter.   Yoali Conry L Chelly Dombeck, PA-C 07/30/20

## 2020-07-30 ENCOUNTER — Inpatient Hospital Stay: Payer: 59

## 2020-07-30 ENCOUNTER — Other Ambulatory Visit: Payer: Self-pay

## 2020-07-30 ENCOUNTER — Encounter: Payer: Self-pay | Admitting: *Deleted

## 2020-07-30 ENCOUNTER — Encounter: Payer: Self-pay | Admitting: Physician Assistant

## 2020-07-30 ENCOUNTER — Inpatient Hospital Stay (HOSPITAL_BASED_OUTPATIENT_CLINIC_OR_DEPARTMENT_OTHER): Payer: 59 | Admitting: Physician Assistant

## 2020-07-30 ENCOUNTER — Other Ambulatory Visit: Payer: 59

## 2020-07-30 VITALS — BP 133/78 | HR 98 | Temp 97.7°F | Resp 18 | Ht 67.0 in | Wt 182.1 lb

## 2020-07-30 DIAGNOSIS — C50411 Malignant neoplasm of upper-outer quadrant of right female breast: Secondary | ICD-10-CM

## 2020-07-30 DIAGNOSIS — Z170 Estrogen receptor positive status [ER+]: Secondary | ICD-10-CM

## 2020-07-30 DIAGNOSIS — Z95828 Presence of other vascular implants and grafts: Secondary | ICD-10-CM

## 2020-07-30 DIAGNOSIS — Z5111 Encounter for antineoplastic chemotherapy: Secondary | ICD-10-CM | POA: Diagnosis not present

## 2020-07-30 LAB — CBC WITH DIFFERENTIAL/PLATELET
Abs Immature Granulocytes: 0.87 10*3/uL — ABNORMAL HIGH (ref 0.00–0.07)
Basophils Absolute: 0.1 10*3/uL (ref 0.0–0.1)
Basophils Relative: 1 %
Eosinophils Absolute: 0.3 10*3/uL (ref 0.0–0.5)
Eosinophils Relative: 3 %
HCT: 28.3 % — ABNORMAL LOW (ref 36.0–46.0)
Hemoglobin: 9.1 g/dL — ABNORMAL LOW (ref 12.0–15.0)
Immature Granulocytes: 10 %
Lymphocytes Relative: 14 %
Lymphs Abs: 1.2 10*3/uL (ref 0.7–4.0)
MCH: 29.8 pg (ref 26.0–34.0)
MCHC: 32.2 g/dL (ref 30.0–36.0)
MCV: 92.8 fL (ref 80.0–100.0)
Monocytes Absolute: 1.2 10*3/uL — ABNORMAL HIGH (ref 0.1–1.0)
Monocytes Relative: 14 %
Neutro Abs: 5 10*3/uL (ref 1.7–7.7)
Neutrophils Relative %: 58 %
Platelets: 333 10*3/uL (ref 150–400)
RBC: 3.05 MIL/uL — ABNORMAL LOW (ref 3.87–5.11)
RDW: 15.9 % — ABNORMAL HIGH (ref 11.5–15.5)
WBC: 8.6 10*3/uL (ref 4.0–10.5)
nRBC: 0.8 % — ABNORMAL HIGH (ref 0.0–0.2)

## 2020-07-30 LAB — COMPREHENSIVE METABOLIC PANEL
ALT: 14 U/L (ref 0–44)
AST: 16 U/L (ref 15–41)
Albumin: 3.5 g/dL (ref 3.5–5.0)
Alkaline Phosphatase: 128 U/L — ABNORMAL HIGH (ref 38–126)
Anion gap: 9 (ref 5–15)
BUN: 7 mg/dL — ABNORMAL LOW (ref 8–23)
CO2: 27 mmol/L (ref 22–32)
Calcium: 10.5 mg/dL — ABNORMAL HIGH (ref 8.9–10.3)
Chloride: 102 mmol/L (ref 98–111)
Creatinine, Ser: 0.64 mg/dL (ref 0.44–1.00)
GFR, Estimated: >60 mL/min (ref >60)
Glucose, Bld: 123 mg/dL — ABNORMAL HIGH (ref 70–99)
Potassium: 4.1 mmol/L (ref 3.5–5.1)
Sodium: 138 mmol/L (ref 135–145)
Total Bilirubin: <0.2 mg/dL — ABNORMAL LOW (ref 0.3–1.2)
Total Protein: 7.1 g/dL (ref 6.5–8.1)

## 2020-07-30 MED ORDER — SODIUM CHLORIDE 0.9 % IV SOLN
150.0000 mg | Freq: Once | INTRAVENOUS | Status: AC
  Administered 2020-07-30: 150 mg via INTRAVENOUS
  Filled 2020-07-30: qty 150

## 2020-07-30 MED ORDER — PROCHLORPERAZINE MALEATE 10 MG PO TABS
ORAL_TABLET | ORAL | Status: AC
  Filled 2020-07-30: qty 1

## 2020-07-30 MED ORDER — PALONOSETRON HCL INJECTION 0.25 MG/5ML
0.2500 mg | Freq: Once | INTRAVENOUS | Status: AC
  Administered 2020-07-30: 0.25 mg via INTRAVENOUS

## 2020-07-30 MED ORDER — DOXORUBICIN HCL CHEMO IV INJECTION 2 MG/ML
60.0000 mg/m2 | Freq: Once | INTRAVENOUS | Status: AC
  Administered 2020-07-30: 120 mg via INTRAVENOUS
  Filled 2020-07-30: qty 60

## 2020-07-30 MED ORDER — HEPARIN SOD (PORK) LOCK FLUSH 100 UNIT/ML IV SOLN
500.0000 [IU] | Freq: Once | INTRAVENOUS | Status: AC | PRN
  Administered 2020-07-30: 500 [IU]
  Filled 2020-07-30: qty 5

## 2020-07-30 MED ORDER — PALONOSETRON HCL INJECTION 0.25 MG/5ML
INTRAVENOUS | Status: AC
  Filled 2020-07-30: qty 5

## 2020-07-30 MED ORDER — SODIUM CHLORIDE 0.9% FLUSH
10.0000 mL | INTRAVENOUS | Status: DC | PRN
  Administered 2020-07-30: 10 mL
  Filled 2020-07-30: qty 10

## 2020-07-30 MED ORDER — PROCHLORPERAZINE EDISYLATE 10 MG/2ML IJ SOLN
10.0000 mg | Freq: Once | INTRAMUSCULAR | Status: AC
  Administered 2020-07-30: 10 mg via INTRAVENOUS

## 2020-07-30 MED ORDER — SODIUM CHLORIDE 0.9 % IV SOLN
600.0000 mg/m2 | Freq: Once | INTRAVENOUS | Status: AC
  Administered 2020-07-30: 1200 mg via INTRAVENOUS
  Filled 2020-07-30: qty 60

## 2020-07-30 MED ORDER — SODIUM CHLORIDE 0.9 % IV SOLN
Freq: Once | INTRAVENOUS | Status: AC
Start: 2020-07-30 — End: 2020-07-30
  Filled 2020-07-30: qty 250

## 2020-07-30 MED ORDER — PROCHLORPERAZINE EDISYLATE 10 MG/2ML IJ SOLN
INTRAMUSCULAR | Status: AC
  Filled 2020-07-30: qty 2

## 2020-07-30 NOTE — Patient Instructions (Signed)
[facility]  Discharge Instructions: Thank you for choosing [facility] to provide your oncology and hematology care.   If you have a lab appointment with the [facility], please go directly to the [facility] and check in at the registration area.   Wear comfortable clothing and clothing appropriate for easy access to any Portacath or PICC line.   We strive to give you quality time with your provider. You may need to reschedule your appointment if you arrive late (15 or more minutes).  Arriving late affects you and other patients whose appointments are after yours.  Also, if you miss three or more appointments without notifying the office, you may be dismissed from the clinic at the provider's discretion.      For prescription refill requests, have your pharmacy contact our office and allow 72 hours for refills to be completed.    Today you received the following chemotherapy and/or immunotherapy agents : Adriamycin, Cytoxan     To help prevent nausea and vomiting after your treatment, we encourage you to take your nausea medication as directed.  BELOW ARE SYMPTOMS THAT SHOULD BE REPORTED IMMEDIATELY: *FEVER GREATER THAN 100.4 F (38 C) OR HIGHER *CHILLS OR SWEATING *NAUSEA AND VOMITING THAT IS NOT CONTROLLED WITH YOUR NAUSEA MEDICATION *UNUSUAL SHORTNESS OF BREATH *UNUSUAL BRUISING OR BLEEDING *URINARY PROBLEMS (pain or burning when urinating, or frequent urination) *BOWEL PROBLEMS (unusual diarrhea, constipation, pain near the anus) TENDERNESS IN MOUTH AND THROAT WITH OR WITHOUT PRESENCE OF ULCERS (sore throat, sores in mouth, or a toothache) UNUSUAL RASH, SWELLING OR PAIN  UNUSUAL VAGINAL DISCHARGE OR ITCHING   Items with * indicate a potential emergency and should be followed up as soon as possible or go to the Emergency Department if any problems should occur.  Please show the CHEMOTHERAPY ALERT CARD or IMMUNOTHERAPY ALERT CARD at  check-in to the Emergency Department and triage nurse.  Should you have questions after your visit or need to cancel or reschedule your appointment, please contact [facility]  Dept: [phone]  and follow the prompts.  Office hours are 8:00 a.m. to 4:30 p.m. Monday - Friday. Please note that voicemails left after 4:00 p.m. may not be returned until the following business day.  We are closed weekends and major holidays. You have access to a nurse at all times for urgent questions. Please call the main number to the clinic Dept: [phone] and follow the prompts.   For any non-urgent questions, you may also contact your provider using MyChart. We now offer e-Visits for anyone 74 and older to request care online for non-urgent symptoms. For details visit [email or web address].   Also download the MyChart app! Go to the app store, search "MyChart", open the app, select [facility], and log in with your MyChart username and password.  Due to Covid, a mask is required upon entering the hospital/clinic. If you do not have a mask, one will be given to you upon arrival. For doctor visits, patients may have 1 support person aged 15 or older with them. For treatment visits, patients cannot have anyone with them due to current Covid guidelines and our immunocompromised population.   Calcium Content in Foods Calcium is the most abundant mineral in the body. Most of the body's calcium supply is stored in bones and teeth. Calcium helps many parts of the body function normally, including: Blood and blood vessels. Nerves. Hormones. Muscles. Bones and teeth. When your calcium  stores are low, you may be at risk for low bone mass, bone loss, and broken bones (fractures). When you get enough calcium, it helps to support strong bones and teeththroughout your life. Calcium is especially important for: Children during growth spurts. Girls during adolescence. Women who are pregnant  or breastfeeding. Women after their menstrual cycle stops (postmenopause). Women whose menstrual cycle has stopped due to anorexia nervosa or regular intense exercise. People who cannot eat or digest dairy products. Vegans. Recommended daily amounts of calcium: Women (ages 22 to 75): 1,000 mg per day. Women (ages 75 and older): 1,200 mg per day. Men (ages 34 to 1): 1,000 mg per day. Men (ages 3 and older): 1,200 mg per day. Women (ages 17 to 64): 1,300 mg per day. Men (ages 33 to 38): 1,300 mg per day. General information Eat foods that are high in calcium. Try to get most of your calcium from food. Some people may benefit from taking calcium supplements. Check with your health care provider or diet and nutrition specialist (dietitian) before starting any calcium supplements. Calcium supplements may interact with certain medicines. Too much calcium may cause other health problems, such as constipation and kidney stones. For the body to absorb calcium, it needs vitamin D. Sources of vitamin D include: Skin exposure to direct sunlight. Foods, such as egg yolks, liver, mushrooms, saltwater fish, and fortified milk. Vitamin D supplements. Check with your health care provider or dietitian before starting any vitamin D supplements. What foods are high in calcium?  Foods that are high in calcium contain more than 100 milligrams per serving. Fruits Fortified orange juice or other fruit juice, 300 mg per 8 oz serving. Vegetables Collard greens, 360 mg per 8 oz serving. Kale, 100 mg per 8 oz serving. Bok choy, 160 mg per 8 oz serving. Grains Fortified ready-to-eat cereals, 100 to 1,000 mg per 8 oz serving. Fortified frozen waffles, 200 mg in 2 waffles. Oatmeal, 140 mg in 1 cup. Meats and other proteins Sardines, canned with bones, 325 mg per 3 oz serving. Salmon, canned with bones, 180 mg per 3 oz serving. Canned shrimp, 125 mg per 3 oz serving. Baked beans, 160 mg per 4 oz serving. Tofu,  firm, made with calcium sulfate, 253 mg per 4 oz serving. Dairy Yogurt, plain, low-fat, 310 mg per 6 oz serving. Nonfat milk, 300 mg per 8 oz serving. American cheese, 195 mg per 1 oz serving. Cheddar cheese, 205 mg per 1 oz serving. Cottage cheese 2%, 105 mg per 4 oz serving. Fortified soy, rice, or almond milk, 300 mg per 8 oz serving. Mozzarella, part skim, 210 mg per 1 oz serving. The items listed above may not be a complete list of foods high in calcium. Actual amounts of calcium may be different depending on processing. Contact a dietitian for more information. What foods are lower in calcium? Foods that are lower in calcium contain 50 mg or less per serving. Fruits Apple, about 6 mg. Banana, about 12 mg. Vegetables Lettuce, 19 mg per 2 oz serving. Tomato, about 11 mg. Grains Rice, 4 mg per 6 oz serving. Boiled potatoes, 14 mg per 8 oz serving. White bread, 6 mg per slice. Meats and other proteins Egg, 27 mg per 2 oz serving. Red meat, 7 mg per 4 oz serving. Chicken, 17 mg per 4 oz serving. Fish, cod, or trout, 20 mg per 4 oz serving. Dairy Cream cheese, regular, 14 mg per 1 Tbsp serving. Brie cheese, 50 mg per 1  oz serving. Parmesan cheese, 70 mg per 1 Tbsp serving. The items listed above may not be a complete list of foods lower in calcium. Actual amounts of calcium may be different depending on processing. Contact a dietitian for more information. Summary Calcium is an important mineral in the body because it affects many functions. Getting enough calcium helps support strong bones and teeth throughout your life. Try to get most of your calcium from food. Calcium supplements may interact with certain medicines. Check with your health care provider or dietitian before starting any calcium supplements. This information is not intended to replace advice given to you by your health care provider. Make sure you discuss any questions you have with your healthcare  provider. Document Revised: 05/31/2019 Document Reviewed: 05/31/2019 Elsevier Patient Education  [place].

## 2020-08-01 ENCOUNTER — Other Ambulatory Visit: Payer: Self-pay

## 2020-08-01 ENCOUNTER — Inpatient Hospital Stay: Payer: 59

## 2020-08-01 VITALS — BP 130/77 | HR 96 | Resp 18

## 2020-08-01 DIAGNOSIS — Z170 Estrogen receptor positive status [ER+]: Secondary | ICD-10-CM

## 2020-08-01 DIAGNOSIS — Z5111 Encounter for antineoplastic chemotherapy: Secondary | ICD-10-CM | POA: Diagnosis not present

## 2020-08-01 DIAGNOSIS — C50411 Malignant neoplasm of upper-outer quadrant of right female breast: Secondary | ICD-10-CM

## 2020-08-01 MED ORDER — PEGFILGRASTIM-BMEZ 6 MG/0.6ML ~~LOC~~ SOSY
PREFILLED_SYRINGE | SUBCUTANEOUS | Status: AC
  Filled 2020-08-01: qty 0.6

## 2020-08-01 MED ORDER — PEGFILGRASTIM-BMEZ 6 MG/0.6ML ~~LOC~~ SOSY
6.0000 mg | PREFILLED_SYRINGE | Freq: Once | SUBCUTANEOUS | Status: AC
  Administered 2020-08-01: 6 mg via SUBCUTANEOUS

## 2020-08-01 NOTE — Patient Instructions (Signed)

## 2020-08-02 ENCOUNTER — Encounter: Payer: Self-pay | Admitting: *Deleted

## 2020-08-06 NOTE — Progress Notes (Signed)
Yolanda [name] [name]  [phone] [phone]     ID: Yolanda [name] CLINKSCALES DOB: 16-Jan-1961  MR#: [id number]  HKV#:[id number]  [name] Care Team: Yolanda Lukes, MD as PCP - General (Family Medicine) Yolanda Bickers, MD as Consulting Physician (Infectious Diseases) Yolanda Salon, MD as Consulting Physician (Gynecology) Yolanda [name] [name], Yolanda Dad, MD as Consulting Physician (Oncology) Yolanda [name] Craver, MD as Consulting Physician (Gastroenterology) Yolanda Klein, MD as Consulting Physician (General Surgery) Yolanda Kaufmann, RN as Oncology Nurse Navigator Yolanda [name] Germany, RN as Oncology Nurse Navigator Yolanda [name] Cruel, MD OTHER MD:   CHIEF COMPLAINT: Estrogen receptor positive breast cancer  CURRENT TREATMENT: Adjuvant chemotherapy   INTERVAL HISTORY: Yolanda [name] [name] returns today for follow up and treatment of her estrogen receptor positive breast cancer.    She began adjuvant chemotherapy on 06/18/2020, consisting of cyclophosphamide and doxorubicin in dose dense fashion with pegfilgrastim on day 3.  She completed her fourth cycle last week and is scheduled to begin weekly paclitaxel 08/14/2018  She received PEG filgrastim on day 3 with their [facility] treatments.  She found this actually to be more of an issue than Yolanda [name] chemo itself.   REVIEW OF SYSTEMS: Yolanda [name] [name] tells me she feels she did "pretty good" with Yolanda [name] chemo.  When she gets Yolanda [name] PEG filgrastim shot she has pain at Yolanda [name] soles of her feet which last several days.  That is now fading.  She continues to have mild nausea all Yolanda [name] time.  She does not actually vomit.  She is having some reflux symptoms and cough.  She feels very tired, cannot really exercise, and notices palpitations all of that of course related to her anemia.  A detailed review of systems today was otherwise stable  COVID 19 VACCINATION STATUS: fully vaccinated [employer]), with booster 12/2019   HISTORY OF CURRENT ILLNESS: From Yolanda [name] original intake note:   I saw Ms.  [name] in December 2018 for evaluation of atypical lobular hyperplasia.  We discussed various strategies regarding breast cancer prevention at that time for her to consider.    More recently (around Yolanda [name] time of Yolanda [name] Super Bowl 2022) she found a lump in her right breast.  She underwent bilateral diagnostic mammography with tomography and right breast ultrasonography at Yolanda [name] [place] on 04/10/2020 showing: breast density category C; palpable 3 cm mass in right breast at 12 o'clock; no enlarged adenopathy in right axilla.  Accordingly on Yolanda [name] same day, she proceeded to biopsy of Yolanda [name] right breast area in question. Yolanda [name] pathology from this procedure ([id number]) showed: invasive ductal carcinoma, grade 3; ductal carcinoma in situ. Prognostic indicators significant for: estrogen receptor, 90% positive with moderate staining intensity and progesterone receptor, 0% negative. Proliferation marker Ki67 at 25%. HER2 equivocal by immunohistochemistry (2+), but negative by fluorescent in situ hybridization with a signals ratio 1.49 and number per cell 2.75.  Cancer Staging Malignant neoplasm of upper-outer quadrant of right breast in female, estrogen receptor positive (Yolanda [name] [name]) Staging form: Breast, AJCC 8th Edition - Clinical: Stage IIA (cT2, cN0, cM0, G3, ER+, PR+, HER2-) - Signed by Yolanda [name] Cruel, MD on 04/18/2020  Yolanda [name] [name]'s subsequent history is as detailed below.   PAST MEDICAL HISTORY: Past Medical History:  Diagnosis Date   Asthma    with respiratory infection   Cancer (Yolanda [name] [name])    breast cancer - right   Diabetes mellitus without complication ([place])    type II   Fatty liver    Fibroid uterus    Hypertension    [name] reports  that she has never been diagnosied with HTN, "I take HCTZ for swelling in my feet, if needed."   Osteoarthritis 03/10/2015   PONV (postoperative nausea and vomiting)    2014   Refusal of blood transfusions as [name] is Jehovah's Witness    NO BLOOD PRODUCTS   Sleep  apnea     PAST SURGICAL HISTORY: Past Surgical History:  Procedure Laterality Date   BREAST CYST ASPIRATION     BREAST EXCISIONAL BIOPSY     BREAST LUMPECTOMY WITH SENTINEL LYMPH NODE BIOPSY Right 05/21/2020   Procedure: RIGHT BREAST LUMPECTOMY WITH SENTINEL LYMPH NODE BIOPSY;  Surgeon: Yolanda Klein, MD;  Location: [place];  Service: General;  Laterality: Right;   CHOLECYSTECTOMY  1/11   COLONOSCOPY     DILATATION & CURRETTAGE/HYSTEROSCOPY WITH RESECTOCOPE N/A 03/13/2014   Procedure: DILATATION & CURETTAGE/HYSTEROSCOPY ;  Surgeon: Lyman Speller, MD;  Location: [facility];  Service: Gynecology;  Laterality: N/A;   PORTACATH PLACEMENT Left 05/21/2020   Procedure: INSERTION PORT-A-CATH;  Surgeon: Yolanda Klein, MD;  Location: [place];  Service: General;  Laterality: Left;   RADIOACTIVE SEED GUIDED EXCISIONAL BREAST BIOPSY Left 12/30/2016   Procedure: RADIOACTIVE SEED GUIDED EXCISIONAL BREAST BIOPSY;  Surgeon: Yolanda Klein, MD;  Location: [place];  Service: General;  Laterality: Left;    FAMILY HISTORY: Family History  Problem Relation Age of Onset   Asthma Mother    Diabetes Mother    Diabetes Father    Heart attack Father    Diabetes Brother    Diabetes Brother    Asthma Brother    Heart Problems Sister    Asthma Sister    Hypertension Sister    Heart disease Maternal Grandfather    Osteopenia Sister   Yolanda [name] [name]'s father died from heart disease at Yolanda [name] age of 88.  Yolanda [name] [name]'s mother is 49 years old as of March 2022.  Yolanda [name] [name] has 4 brothers and 2 sisters.  She is not aware of any history of cancer in her family.   GYNECOLOGIC HISTORY:  [name]'s last menstrual period was 02/21/2014. Menarche: 60 years old GX P 0 LMP age 43 Contraceptive 1 year, no complications HRT no  Hysterectomy? no BSO? 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  Her husband Yolanda [name] [name] works as a Marketing executive for [employer].  He has 2 children from a  prior marriage, both living in [place].  One is traveling nurse.  Yolanda [name] other 1 is a Administrator.  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    ADVANCED DIRECTIVES: In Yolanda [name] absence of any documentation to Yolanda [name] contrary, Yolanda [name] [name]'s spouse is their HCPOA.  Yolanda [name] [name] made it clear at her visit 04/18/2020 that she would not accept any blood transfusion or blood products for any reason including to stave off death   HEALTH MAINTENANCE: Social History   Tobacco Use   Smoking status: Never   Smokeless tobacco: Never  Vaping Use   Vaping Use: Never used  Substance Use Topics   Alcohol use: No   Drug use: No     Colonoscopy: 10/2019 (Dr. Collene Mares), recall 2026  PAP: 03/2017, negative  Bone density: 09/2018, -0.8   Allergies  Allergen Reactions   Lisinopril Shortness Of Breath and Cough    wheezing    Current Outpatient Medications  Medication Sig Dispense Refill   omeprazole (PRILOSEC) 40 MG capsule Take 1 capsule (40 mg total) by mouth at bedtime. 30 capsule 1  albuterol (PROVENTIL HFA;VENTOLIN HFA) 108 (90 Base) MCG/ACT inhaler Inhale 1-2 puffs into Yolanda [name] lungs every 4 (four) hours as needed for wheezing or shortness of breath. 18 g 2   atorvastatin (LIPITOR) 10 MG tablet Take 10 mg by mouth every other day.     b complex vitamins capsule Take 1 capsule by mouth every other day.     B-D ULTRA-FINE 33 LANCETS MISC Check BG 2 times/day     Carboxymethylcellul-Glycerin (REFRESH OPTIVE OP) Place 1 drop into both eyes 3 (three) times daily as needed (dry / irritated eyes).     cholecalciferol (VITAMIN D3) 25 MCG (1000 UNIT) tablet Take 1,000 Units by mouth every other day.     ciprofloxacin (CIPRO) 500 MG tablet Take 1 tablet (500 mg total) by mouth 2 (two) times daily. Take for 5 days, then stop. Repeat May 24, June 7 and June 21 40 tablet 1   doxycycline (VIBRA-TABS) 100 MG tablet Take 100 mg by mouth 2 (two) times daily.     fluticasone (FLONASE) 50 MCG/ACT nasal spray Place 1 spray into both  nostrils daily as needed for allergies or rhinitis.     glucose blood test strip Check BG 2 times/day.  Dx E11.65     hydrochlorothiazide (HYDRODIURIL) 25 MG tablet Take 25 mg by mouth every other day.     hydrocortisone cream 1 % Apply 1 application topically daily as needed for itching.     lidocaine-prilocaine (EMLA) cream Apply to affected area once 30 g 3   loratadine (CLARITIN) 10 MG tablet Take 1 tablet (10 mg total) by mouth daily. 90 tablet 0   magnesium oxide (MAG-OX) 400 MG tablet Take 400 mg by mouth every other day.     metFORMIN (GLUCOPHAGE-XR) 500 MG 24 hr tablet Take 2 tablets (1,000 mg total) by mouth 2 (two) times daily. 120 tablet 3   milk thistle 175 MG tablet Take 175 mg by mouth every other day.     Omega-3 Fatty Acids (OMEGA 3 PO) Take 1 capsule by mouth every other day.     omeprazole (PRILOSEC) 40 MG capsule Take 40 mg by mouth daily as needed (acid reflux).     oxyCODONE (OXY IR/ROXICODONE) 5 MG immediate release tablet Take 1 tablet (5 mg total) by mouth every 6 (six) hours as needed for severe pain. 10 tablet 0   prochlorperazine (COMPAZINE) 5 MG tablet Take 1 tablet (5 mg total) by mouth every 6 (six) hours as needed for nausea or vomiting. 30 tablet 0   SOYA LECITHIN PO Take 1 capsule by mouth every other day.     valACYclovir (VALTREX) 500 MG tablet Take 1 tablet (500 mg total) by mouth daily. 60 tablet 1   vitamin C (ASCORBIC ACID) 500 MG tablet Take 500 mg by mouth every other day.     No current facility-administered medications for this visit.   SUPPLEMENTS: Aside from Yolanda [name] medications listed above, Yolanda [name] [name] takes a teaspoon of olive oil daily, 1000 mg of vitamin C every other day, sunflower lecithin 600 mg every other day, vitamin D3 5000 mg every other day, magnesium 200 mg every other day, milk thistle 150 mg every other day, a B complex vitamin (B6 and B12) every other day, as well as Osteo Bi-Flex weekly and fluticasone as needed  OBJECTIVE: Middle-aged  woman in no acute distress  Vitals:   08/07/20 1613  BP: 133/80  Pulse: 90  Resp: 18  Temp: 98.1 F (36.7 C)  SpO2:  100%     Body mass index is 28.29 kg/m.   Wt Readings from Last 3 Encounters:  08/07/20 180 lb 9.6 oz (81.9 kg)  07/30/20 182 lb 1.6 oz (82.6 kg)  07/16/20 180 lb (81.6 kg)     ECOG FS:1 - Symptomatic but completely ambulatory  Sclerae unicteric, EOMs intact Wearing a mask No cervical or supraclavicular adenopathy Lungs no rales or rhonchi Heart regular rate and rhythm Abd soft, nontender, positive bowel sounds MSK no focal spinal tenderness, no upper extremity lymphedema Neuro: nonfocal, well oriented, appropriate affect Breasts: Deferred; she continues to have problems with her incisional hematoma followed closely by Dr. Barry Dienes   LAB RESULTS:  CMP     Component Value Date/Time   NA 135 08/07/2020 1602   NA 138 11/04/2017 0000   K 4.3 08/07/2020 1602   CL 96 (L) 08/07/2020 1602   CO2 27 08/07/2020 1602   GLUCOSE 103 (H) 08/07/2020 1602   BUN 8 08/07/2020 1602   BUN 14 11/04/2017 0000   CREATININE 0.63 08/07/2020 1602   CREATININE 0.81 04/18/2020 1527   CREATININE 0.60 09/07/2014 0950   CALCIUM 11.0 (H) 08/07/2020 1602   PROT 6.9 08/07/2020 1602   ALBUMIN 3.3 (L) 08/07/2020 1602   AST 15 08/07/2020 1602   AST 26 04/18/2020 1527   ALT 13 08/07/2020 1602   ALT 37 04/18/2020 1527   ALKPHOS 151 (H) 08/07/2020 1602   BILITOT <0.2 (L) 08/07/2020 1602   BILITOT 0.3 04/18/2020 1527   GFRNONAA >60 08/07/2020 1602   GFRNONAA >60 04/18/2020 1527   GFRAA >60 05/15/2019 1803    No results found for: TOTALPROTELP, ALBUMINELP, A1GS, A2GS, BETS, BETA2SER, GAMS, MSPIKE, SPEI  Lab Results  Component Value Date   WBC 8.9 08/07/2020   NEUTROABS 5.4 08/07/2020   HGB 8.8 (L) 08/07/2020   HCT 27.2 (L) 08/07/2020   MCV 92.8 08/07/2020   PLT 294 08/07/2020    No results found for: LABCA2  No components found for: [id number]  No results for input(s):  INR in Yolanda [name] last 168 hours.  No results found for: LABCA2  No results found for: WUJ811  No results found for: BJY782  No results found for: NFA213  No results found for: CA2729  No components found for: HGQUANT  No results found for: CEA1 / No results found for: CEA1   No results found for: AFPTUMOR  No results found for: CHROMOGRNA  No results found for: KPAFRELGTCHN, LAMBDASER, KAPLAMBRATIO (kappa/lambda light chains)  No results found for: HGBA, HGBA2QUANT, HGBFQUANT, HGBSQUAN (Hemoglobinopathy evaluation)   No results found for: LDH  Lab Results  Component Value Date   IRON 38 (L) 10/16/2009   IRONPCTSAT 10.2 (L) 10/16/2009   (Iron and TIBC)  Lab Results  Component Value Date   FERRITIN 8.9 (L) 10/16/2009    Urinalysis    Component Value Date/Time   COLORURINE YELLOW 03/11/2009 1100   APPEARANCEUR CLEAR 03/11/2009 1100   LABSPEC 1.017 03/11/2009 1100   PHURINE 7.0 03/11/2009 1100   GLUCOSEU NEGATIVE 03/11/2009 1100   HGBUR LARGE (A) 03/11/2009 1100   BILIRUBINUR N 05/21/2015 1614   KETONESUR NEGATIVE 03/11/2009 1100   PROTEINUR N 05/21/2015 1614   PROTEINUR NEGATIVE 03/11/2009 1100   UROBILINOGEN negative 05/21/2015 1614   UROBILINOGEN 0.2 03/11/2009 1100   NITRITE N 05/21/2015 1614   NITRITE NEGATIVE 03/11/2009 1100   LEUKOCYTESUR Negative 05/21/2015 1614    STUDIES: No results found.   ELIGIBLE FOR AVAILABLE RESEARCH PROTOCOL: no  ASSESSMENT: 60 y.o. [place] woman status post right breast upper outer quadrant biopsy 04/10/2020 for a clinical T2N0, stage IIA invasive ductal carcinoma, grade 3, estrogen receptor positive, progesterone receptor and HER-2 negative, with an MIB-1 of 25%.  (1) Oncotype obtained from Yolanda [name] original biopsy shows a score of 48, predicting a recurrence rate outside Yolanda [name] breast within 9 years of 28% if Yolanda [name] only systemic therapy is antiestrogen for 5 years.  It also predicts a greater than 15% benefit from  chemotherapy.  (2) right lumpectomy and sentinel lymph node dissection 05/21/2020 shows a pT2 pN0, stage IIB invasive ductal carcinoma, grade 3, with negative margins.  (3) adjuvant chemotherapy to consist of cyclophosphamide and doxorubicin in dose dense fashion x4 starting 06/18/2020, with Neulasta day 3; to be followed by paclitaxel weekly x12.  (a) echo 05/13/2020 shows an ejection fraction in Yolanda [name] 55-60% range  (4) adjuvant radiation  (5) antiestrogens.   PLAN: Yolanda [name] [name] completed Yolanda [name] most intense part of her treatment and generally did well with it.  She is fatigued and somewhat short of breath secondary to anemia.  She is having some reflux symptoms and I think her cough will resolve if she starts omeprazole which I prescribed for her.  Given Yolanda [name] way she is feeling now and her apprehension regarding Taxol I think it would be prudent to start after Yolanda [name] July 4 holiday so she is going to get an extra week off before resuming treatment.  We did discuss Yolanda [name] Taxol treatments in detail.  She understands Yolanda [name] concern regarding neuropathy and Yolanda [name] difference between numbness in Yolanda [name] pads of Yolanda [name] fingers and discomfort at Yolanda [name] nail bases which is also very common.  She will return Yolanda [name] first week in July for her first dose and then she will see me with her second dose and I asked her to keep a symptom diary so we can troubleshoot problems that may develop at that time  Total encounter time 30 minutes.Sarajane Jews C. Sehar Sedano, MD 08/07/2020 5:03 PM Medical Oncology and Hematology [facility] [place], [place] [address] Tel. [phone]    Fax. [phone]   This document serves as a record of services personally performed by Lurline Del, MD. It was created on his behalf by Wilburn Mylar, a trained medical scribe. Yolanda [name] creation of this record is based on Yolanda [name] scribe's personal observations and Yolanda [name] provider's statements to them.   I, Lurline Del MD, have reviewed  Yolanda [name] above documentation for accuracy and completeness, and I agree with Yolanda [name] above.   *Total Encounter Time as defined by Yolanda [name] [employer] Medicare and Medicaid Services includes, in addition to Yolanda [name] face-to-face time of a [name] visit (documented in Yolanda [name] note above) non-face-to-face time: obtaining and reviewing outside history, ordering and reviewing medications, tests or procedures, care coordination (communications with other health care professionals or caregivers) and documentation in Yolanda [name] medical record.

## 2020-08-07 ENCOUNTER — Inpatient Hospital Stay (HOSPITAL_BASED_OUTPATIENT_CLINIC_OR_DEPARTMENT_OTHER): Payer: 59 | Admitting: Oncology

## 2020-08-07 ENCOUNTER — Other Ambulatory Visit: Payer: Self-pay

## 2020-08-07 ENCOUNTER — Inpatient Hospital Stay: Payer: 59

## 2020-08-07 VITALS — BP 133/80 | HR 90 | Temp 98.1°F | Resp 18 | Wt 180.6 lb

## 2020-08-07 DIAGNOSIS — Z170 Estrogen receptor positive status [ER+]: Secondary | ICD-10-CM

## 2020-08-07 DIAGNOSIS — C50411 Malignant neoplasm of upper-outer quadrant of right female breast: Secondary | ICD-10-CM

## 2020-08-07 DIAGNOSIS — Z5111 Encounter for antineoplastic chemotherapy: Secondary | ICD-10-CM | POA: Diagnosis not present

## 2020-08-07 LAB — CBC WITH DIFFERENTIAL/PLATELET
Abs Immature Granulocytes: 0.53 10*3/uL — ABNORMAL HIGH (ref 0.00–0.07)
Basophils Absolute: 0.1 10*3/uL (ref 0.0–0.1)
Basophils Relative: 1 %
Eosinophils Absolute: 0.2 10*3/uL (ref 0.0–0.5)
Eosinophils Relative: 2 %
HCT: 27.2 % — ABNORMAL LOW (ref 36.0–46.0)
Hemoglobin: 8.8 g/dL — ABNORMAL LOW (ref 12.0–15.0)
Immature Granulocytes: 6 %
Lymphocytes Relative: 15 %
Lymphs Abs: 1.3 10*3/uL (ref 0.7–4.0)
MCH: 30 pg (ref 26.0–34.0)
MCHC: 32.4 g/dL (ref 30.0–36.0)
MCV: 92.8 fL (ref 80.0–100.0)
Monocytes Absolute: 1.4 10*3/uL — ABNORMAL HIGH (ref 0.1–1.0)
Monocytes Relative: 16 %
Neutro Abs: 5.4 10*3/uL (ref 1.7–7.7)
Neutrophils Relative %: 60 %
Platelets: 294 10*3/uL (ref 150–400)
RBC: 2.93 MIL/uL — ABNORMAL LOW (ref 3.87–5.11)
RDW: 16.1 % — ABNORMAL HIGH (ref 11.5–15.5)
WBC: 8.9 10*3/uL (ref 4.0–10.5)
nRBC: 0.4 % — ABNORMAL HIGH (ref 0.0–0.2)

## 2020-08-07 LAB — COMPREHENSIVE METABOLIC PANEL
ALT: 13 U/L (ref 0–44)
AST: 15 U/L (ref 15–41)
Albumin: 3.3 g/dL — ABNORMAL LOW (ref 3.5–5.0)
Alkaline Phosphatase: 151 U/L — ABNORMAL HIGH (ref 38–126)
Anion gap: 12 (ref 5–15)
BUN: 8 mg/dL (ref 8–23)
CO2: 27 mmol/L (ref 22–32)
Calcium: 11 mg/dL — ABNORMAL HIGH (ref 8.9–10.3)
Chloride: 96 mmol/L — ABNORMAL LOW (ref 98–111)
Creatinine, Ser: 0.63 mg/dL (ref 0.44–1.00)
GFR, Estimated: >60 mL/min (ref >60)
Glucose, Bld: 103 mg/dL — ABNORMAL HIGH (ref 70–99)
Potassium: 4.3 mmol/L (ref 3.5–5.1)
Sodium: 135 mmol/L (ref 135–145)
Total Bilirubin: <0.2 mg/dL — ABNORMAL LOW (ref 0.3–1.2)
Total Protein: 6.9 g/dL (ref 6.5–8.1)

## 2020-08-07 MED ORDER — OMEPRAZOLE 40 MG PO CPDR: 40.0000 mg | DELAYED_RELEASE_CAPSULE | Freq: Every day | ORAL | 1 refills | Status: DC

## 2020-08-08 ENCOUNTER — Telehealth: Payer: Self-pay | Admitting: Oncology

## 2020-08-08 NOTE — Telephone Encounter (Signed)
Scheduled appointment per 06/22 los. Patient is aware.

## 2020-08-13 ENCOUNTER — Inpatient Hospital Stay: Payer: 59

## 2020-08-13 ENCOUNTER — Inpatient Hospital Stay: Payer: 59 | Admitting: Oncology

## 2020-08-16 ENCOUNTER — Encounter: Payer: Self-pay | Admitting: Oncology

## 2020-08-16 NOTE — Progress Notes (Signed)
Created and printed GFE(Good Faith Estimate) for treatment to provide to patient.

## 2020-08-19 NOTE — Progress Notes (Signed)
Name: Yolanda [name]  MRN/ DOB: [id number], May 25, 1960   Age/ Sex: 60 y.o., female    PCP: Mosie Lukes, MD   Reason for Endocrinology Evaluation: Type 2 Diabetes Mellitus     Date of Initial Endocrinology Visit: 08/20/2020     PATIENT IDENTIFIER: Yolanda [name] is a 60 y.o. female with a past medical history of T2DM, Asthma, Breast ca and hx of Fatty liver. The patient presented for initial endocrinology clinic visit on 08/20/2020 for consultative assistance with her diabetes management.    HPI: Yolanda [name] was    Diagnosed with DM in 2015 Prior Medications tried/Intolerance: just metofrmin  Currently checking blood sugars 1 x / day,  before breakfast.  Hypoglycemia episodes : no                Hemoglobin A1c has ranged from 6.9% in 2019, peaking at 7.5% in 2020. Patient required assistance for hypoglycemia:  Patient has required hospitalization within the last 1 year from hyper or hypoglycemia: no  In terms of diet, the patient eats 3 meals a day, doesn't snack. Avoids sugar sweetened beverages    Pt on adjuvant therapy for right Breast ca started 06/2020 ( cyclophosphamide, doxorubicin with pegfilgrastim on day 3 )but  she will start weekly paclitaxel 08/21/2020.   Has noted hyperglycemia since being on chemo   HOME DIABETES REGIMEN: Metformin 500 mg XR 2 tabs BID    Statin: yes ACE-I/ARB: no- Allergic to lisinopril  Prior Diabetic Education: no   METER DOWNLOAD SUMMARY: Date range evaluated: 6/21-08/20/2020  Average Number Tests/Day = 0.6 Overall Mean FS Glucose = 134 Standard Deviation = 23  BG Ranges: Low = 101 High = 173   Hypoglycemic Events/30 Days: BG < 50 = 0 Episodes of symptomatic severe hypoglycemia = 0   DIABETIC COMPLICATIONS: Microvascular complications:   Denies: CKD, neuropathy, retinopathy  Last eye exam: Completed 06/2019  Macrovascular complications:   Denies: CAD, PVD, CVA   PAST HISTORY: Past Medical History:  Past  Medical History:  Diagnosis Date   Asthma    with respiratory infection   Cancer ([place])    breast cancer - right   Diabetes mellitus without complication ([place])    type II   Fatty liver    Fibroid uterus    Hypertension    Patient reports that she has never been diagnosied with HTN, "I take HCTZ for swelling in my feet, if needed."   Osteoarthritis 03/10/2015   PONV (postoperative nausea and vomiting)    2014   Refusal of blood transfusions as patient is Jehov[employer]'s Witness    NO BLOOD PRODUCTS   Sleep apnea    Past Surgical History:  Past Surgical History:  Procedure Laterality Date   BREAST CYST ASPIRATION     BREAST EXCISIONAL BIOPSY     BREAST LUMPECTOMY WITH SENTINEL LYMPH NODE BIOPSY Right 05/21/2020   Procedure: RIGHT BREAST LUMPECTOMY WITH SENTINEL LYMPH NODE BIOPSY;  Surgeon: Stark Klein, MD;  Location: [facility];  Service: General;  Laterality: Right;   CHOLECYSTECTOMY  1/11   COLONOSCOPY     DILATATION & CURRETTAGE/HYSTEROSCOPY WITH RESECTOCOPE N/A 03/13/2014   Procedure: DILATATION & CURETTAGE/HYSTEROSCOPY ;  Surgeon: Lyman Speller, MD;  Location: [facility];  Service: Gynecology;  Laterality: N/A;   PORTACATH PLACEMENT Left 05/21/2020   Procedure: INSERTION PORT-A-CATH;  Surgeon: Stark Klein, MD;  Location: [facility];  Service: General;  Laterality: Left;   RADIOACTIVE SEED GUIDED EXCISIONAL BREAST BIOPSY Left 12/30/2016  Procedure: RADIOACTIVE SEED GUIDED EXCISIONAL BREAST BIOPSY;  Surgeon: Stark Klein, MD;  Location: [facility];  Service: General;  Laterality: Left;    Social History:  reports that she has never smoked. She has never used smokeless tobacco. She reports that she does not drink alcohol and does not use drugs. Family History:  Family History  Problem Relation Age of Onset   Asthma Mother    Diabetes Mother    Diabetes Father    Heart attack Father    Diabetes Brother    Diabetes Brother    Asthma Brother    Heart Problems Sister     Asthma Sister    Hypertension Sister    Heart disease Maternal Grandfather    Osteopenia Sister      HOME MEDICATIONS: Allergies as of 08/20/2020       Reactions   Lisinopril Shortness Of Breath, Cough   wheezing        Medication List        Accurate as of August 20, 2020  8:36 AM. If you have any questions, ask your nurse or doctor.          STOP taking these medications    hydrocortisone cream 1 % Stopped by: Dorita Sciara, MD   magnesium oxide 400 MG tablet Commonly known as: MAG-OX Stopped by: Dorita Sciara, MD   oxyCODONE 5 MG immediate release tablet Commonly known as: Oxy IR/ROXICODONE Stopped by: Dorita Sciara, MD   SOYA LECITHIN PO Stopped by: Dorita Sciara, MD       TAKE these medications    albuterol 108 (90 Base) MCG/ACT inhaler Commonly known as: VENTOLIN HFA Inhale 1-2 puffs into the lungs every 4 (four) hours as needed for wheezing or shortness of breath.   atorvastatin 10 MG tablet Commonly known as: LIPITOR Take 10 mg by mouth every other day.   b complex vitamins capsule Take 1 capsule by mouth every other day.   B-D ULTRA-FINE 33 LANCETS Misc Check BG 2 times/day   cholecalciferol 25 MCG (1000 UNIT) tablet Commonly known as: VITAMIN D3 Take 1,000 Units by mouth every other day.   ciprofloxacin 500 MG tablet Commonly known as: Cipro Take 1 tablet (500 mg total) by mouth 2 (two) times daily. Take for 5 days, then stop. Repeat May 24, June 7 and June 21   doxycycline 100 MG tablet Commonly known as: VIBRA-TABS Take 100 mg by mouth 2 (two) times daily.   fluticasone 50 MCG/ACT nasal spray Commonly known as: FLONASE Place 1 spray into both nostrils daily as needed for allergies or rhinitis.   glucose blood test strip Check BG 2 times/day.  Dx E11.65   hydrochlorothiazide 25 MG tablet Commonly known as: HYDRODIURIL Take 25 mg by mouth every other day.   lidocaine-prilocaine cream Commonly known  as: EMLA Apply to affected area once   loratadine 10 MG tablet Commonly known as: Claritin Take 1 tablet (10 mg total) by mouth daily.   metFORMIN 500 MG 24 hr tablet Commonly known as: GLUCOPHAGE-XR Take 2 tablets (1,000 mg total) by mouth 2 (two) times daily.   milk thistle 175 MG tablet Take 175 mg by mouth every other day.   OMEGA 3 PO Take 1 capsule by mouth every other day.   omeprazole 40 MG capsule Commonly known as: PRILOSEC Take 40 mg by mouth daily as needed (acid reflux). What changed: Another medication with the same name was removed. Continue taking this medication,  and follow the directions you see here. Changed by: Dorita Sciara, MD   prochlorperazine 5 MG tablet Commonly known as: COMPAZINE Take 1 tablet (5 mg total) by mouth every 6 (six) hours as needed for nausea or vomiting.   REFRESH OPTIVE OP Place 1 drop into both eyes 3 (three) times daily as needed (dry / irritated eyes).   valACYclovir 500 MG tablet Commonly known as: VALTREX Take 1 tablet (500 mg total) by mouth daily.   vitamin C 500 MG tablet Commonly known as: ASCORBIC ACID Take 500 mg by mouth every other day.         ALLERGIES: Allergies  Allergen Reactions   Lisinopril Shortness Of Breath and Cough    wheezing     REVIEW OF SYSTEMS: A comprehensive ROS was conducted with the patient and is negative except as per HPI and below:  Review of Systems  Eyes:  Positive for blurred vision.  Gastrointestinal:  Negative for nausea and vomiting.  Neurological:  Positive for tingling.     OBJECTIVE:   VITAL SIGNS: BP (!) 152/84   Pulse 92   Ht 5\' 3"  (1.6 m)   Wt 179 lb (81.2 kg)   LMP 02/21/2014   SpO2 96%   BMI 31.71 kg/m    PHYSICAL EXAM:  General: Pt appears well and is in NAD  Neck: General: Supple without adenopathy or carotid bruits. Thyroid: Thyroid size normal.  No goiter or nodules appreciated.   Lungs: Clear with good BS bilat with no rales, rhonchi, or  wheezes  Heart: RRR with normal S1 and S2 and no gallops; no murmurs; no rub  Abdomen: Normoactive bowel sounds, soft, nontender, without masses or organomegaly palpable  Extremities:  Lower extremities - No pretibial edema. No lesions.  Skin: Normal texture and temperature to palpation. No rash noted.   Neuro: MS is good with appropriate affect, pt is alert and Ox3    DM foot exam: 08/20/2020  The skin of the feet is intact without sores or ulcerations. The pedal pulses are 2+ on right and 2+ on left. The sensation is intact to a screening 5.07, 10 gram monofilament bilaterally   DATA REVIEWED:  Lab Results  Component Value Date   HGBA1C 7.4 (A) 08/20/2020   HGBA1C 7.2 (H) 04/09/2020   HGBA1C 7.0 (H) 02/02/2019   Lab Results  Component Value Date   MICROALBUR 26 11/04/2017   LDLCALC 59 04/09/2020   CREATININE 0.63 08/07/2020   Lab Results  Component Value Date   MICRALBCREAT 2.1 03/04/2015    Lab Results  Component Value Date   CHOL 152 04/09/2020   HDL 61.50 04/09/2020   LDLCALC 59 04/09/2020   LDLDIRECT 128.9 07/14/2006   TRIG 158.0 (H) 04/09/2020   CHOLHDL 2 04/09/2020        ASSESSMENT / PLAN / RECOMMENDATIONS:   1) Type 2 Diabetes Mellitus, Sub-Optimally controlled, Without complications - Most recent A1c of 7.4 %. Goal A1c < 7.0 %.    Plan: GENERAL: I have discussed with the patient the pathophysiology of diabetes. We went over the natural progression of the disease. We talked about both insulin resistance and insulin deficiency. We stressed the importance of lifestyle changes including diet and exercise. I explained the complications associated with diabetes including retinopathy, nephropathy, neuropathy as well as increased risk of cardiovascular disease. We went over the benefit seen with glycemic control.   I explained to the patient that diabetic patients are at higher than normal risk for  amputations.  Pt has noted hyperglycemia since starting  chemotherapy. A1c has trended up since cancer diagnosis and treatment  We discussed add-on therapy with GLP-1 agonists, SGLT-2 inhibitors as well as sulfonylurea. After discussing the risk and benefit of each class we opted to try Jardiance, cautioned against genital infections and encouraged hydration   MEDICATIONS: Start Jardiance 10 mg , 1 tablet in the morning  Continue Metformin 500 mg, 2 tablets with Breakfast and 2 tablet with Supper    EDUCATION / INSTRUCTIONS: BG monitoring instructions: Patient is instructed to check her blood sugars 1 times a day, fasting . Call [facility] if: BG persistently < 70  I reviewed the Rule of 15 for the treatment of hypoglycemia in detail with the patient. Literature supplied.   2) Diabetic complications:  Eye: Does not have known diabetic retinopathy.  Neuro/ Feet: Does not have known diabetic peripheral neuropathy. Renal: Patient does not have known baseline CKD. She is not on an ACEI/ARB at present.  3) Mixed Dyslipidemia : Patient is  on Atorvastatin 10 mg daily. LDL at goal but Tg slightly elevated.  Discussed cardiovascular benefits of statins     Continue Atorvastatin 10 mg daily      F/U in 3 months     Signed electronically by: Mack Guise, MD  [facility]  [facility] [address] [place], [place] [address] Phone: [phone] FAX: [phone]   CC: Mosie Lukes, [address] [place] [address] Phone: [phone]  Fax: [phone]    Return to Endocrinology clinic as below: Future Appointments  Date Time Provider [place]  08/21/2020 11:30 AM [facility] None  08/21/2020 12:30 PM [facility] INFUSION [facility] None  08/28/2020 11:30 AM [facility] FLUSH [facility] None  08/28/2020 12:00 PM Magrinat, Virgie Dad, MD [facility] None  08/28/2020  1:00 PM [facility] INFUSION [facility] None  09/03/2020   9:30 AM [facility] FLUSH [facility] None  09/03/2020 10:30 AM [facility] INFUSION [facility] None  10/17/2020  1:20 PM Mosie Lukes, MD [facility]  01/02/2021  8:45 AM Megan Salon, MD [facility]

## 2020-08-20 ENCOUNTER — Encounter: Payer: Self-pay | Admitting: Internal Medicine

## 2020-08-20 ENCOUNTER — Encounter: Payer: Self-pay | Admitting: Oncology

## 2020-08-20 ENCOUNTER — Ambulatory Visit (INDEPENDENT_AMBULATORY_CARE_PROVIDER_SITE_OTHER): Payer: 59 | Admitting: Internal Medicine

## 2020-08-20 ENCOUNTER — Other Ambulatory Visit: Payer: Self-pay

## 2020-08-20 VITALS — BP 152/84 | HR 92 | Ht 63.0 in | Wt 179.0 lb

## 2020-08-20 DIAGNOSIS — E119 Type 2 diabetes mellitus without complications: Secondary | ICD-10-CM | POA: Diagnosis not present

## 2020-08-20 DIAGNOSIS — E785 Hyperlipidemia, unspecified: Secondary | ICD-10-CM | POA: Diagnosis not present

## 2020-08-20 DIAGNOSIS — E1165 Type 2 diabetes mellitus with hyperglycemia: Secondary | ICD-10-CM | POA: Diagnosis not present

## 2020-08-20 LAB — POCT GLYCOSYLATED HEMOGLOBIN (HGB A1C): Hemoglobin A1C: 7.4 % — AB (ref 4.0–5.6)

## 2020-08-20 MED ORDER — EMPAGLIFLOZIN 10 MG PO TABS: 10.0000 mg | ORAL_TABLET | Freq: Every day | ORAL | 6 refills | Status: DC

## 2020-08-20 MED FILL — Dexamethasone Sodium Phosphate Inj 100 MG/10ML: INTRAMUSCULAR | Qty: 2 | Status: AC

## 2020-08-20 NOTE — Patient Instructions (Signed)
-   Start Jardiance 10 mg , 1 tablet in the morning  - Continue Metformin 500 mg, 2 tablets with Breakfast and 2 tablet with Supper    HOW TO TREAT LOW BLOOD SUGARS (Blood sugar LESS THAN 70 MG/DL) Please follow the RULE OF 15 for the treatment of hypoglycemia treatment (when your (blood sugars are less than 70 mg/dL)   STEP 1: Take 15 grams of carbohydrates when your blood sugar is low, which includes:  3-4 GLUCOSE TABS  OR 3-4 OZ OF JUICE OR REGULAR SODA OR ONE TUBE OF GLUCOSE GEL    STEP 2: RECHECK blood sugar in 15 MINUTES STEP 3: If your blood sugar is still low at the 15 minute recheck --> then, go back to STEP 1 and treat AGAIN with another 15 grams of carbohydrates.

## 2020-08-21 ENCOUNTER — Inpatient Hospital Stay: Payer: 59

## 2020-08-21 ENCOUNTER — Encounter: Payer: Self-pay | Admitting: Oncology

## 2020-08-21 ENCOUNTER — Other Ambulatory Visit: Payer: Self-pay | Admitting: Oncology

## 2020-08-21 ENCOUNTER — Inpatient Hospital Stay: Payer: 59 | Attending: Oncology

## 2020-08-21 DIAGNOSIS — Z95828 Presence of other vascular implants and grafts: Secondary | ICD-10-CM

## 2020-08-21 DIAGNOSIS — K5900 Constipation, unspecified: Secondary | ICD-10-CM | POA: Insufficient documentation

## 2020-08-21 DIAGNOSIS — E119 Type 2 diabetes mellitus without complications: Secondary | ICD-10-CM | POA: Insufficient documentation

## 2020-08-21 DIAGNOSIS — Z5111 Encounter for antineoplastic chemotherapy: Secondary | ICD-10-CM | POA: Insufficient documentation

## 2020-08-21 DIAGNOSIS — I959 Hypotension, unspecified: Secondary | ICD-10-CM | POA: Insufficient documentation

## 2020-08-21 DIAGNOSIS — C50411 Malignant neoplasm of upper-outer quadrant of right female breast: Secondary | ICD-10-CM

## 2020-08-21 DIAGNOSIS — Z170 Estrogen receptor positive status [ER+]: Secondary | ICD-10-CM | POA: Insufficient documentation

## 2020-08-21 MED ORDER — SODIUM CHLORIDE 0.9% FLUSH
10.0000 mL | INTRAVENOUS | Status: DC | PRN
  Filled 2020-08-21: qty 10

## 2020-08-21 NOTE — Progress Notes (Signed)
Yolanda [name]'s right breast wound has slightly dehisced.  It appears shallow and there is no obvious infection.  She is going to be seeing her surgeon tomorrow.  Likely we could start her paclitaxel treatments today but I do not think it will make much difference if we start next week and that gives her an extra week to learn how to deal with this issue and make sure there are no other procedures for her to go through from her surgical point of view

## 2020-08-22 ENCOUNTER — Encounter: Payer: Self-pay | Admitting: *Deleted

## 2020-08-26 ENCOUNTER — Ambulatory Visit: Payer: 59

## 2020-08-26 ENCOUNTER — Telehealth: Payer: Self-pay

## 2020-08-26 ENCOUNTER — Ambulatory Visit: Payer: 59 | Admitting: Adult Health

## 2020-08-26 ENCOUNTER — Other Ambulatory Visit: Payer: 59

## 2020-08-26 NOTE — Telephone Encounter (Signed)
Returned call to pt per her request. Pt states she has an open area on her breast. The surgeon examined it last week and pt states nothing has changed since she was seen. Pt states there is no redness, drainage or odor. Pt was to wash with saop and water and pain dry. Pt is currently doing this. Pt does not have [pain or a fever. Pt to continue to monitor site and symptoms and call if anything changes.

## 2020-08-27 MED FILL — Dexamethasone Sodium Phosphate Inj 100 MG/10ML: INTRAMUSCULAR | Qty: 2 | Status: AC

## 2020-08-27 NOTE — Progress Notes (Signed)
[facility]  [phone] [phone]     ID: Yolanda [name] DOB: 21-Jul-1960  MR#: [id number]  HDT#:[id number]  Patient Care Team: Bradd Canary, MD as PCP - General (Family Medicine) Cliffton Asters, MD as Consulting Physician (Infectious Diseases) Jerene Bears, MD as Consulting Physician (Gynecology) Atiyah Bauer, Valentino Hue, MD as Consulting Physician (Oncology) Charna Elizabeth, MD as Consulting Physician (Gastroenterology) Almond Lint, MD as Consulting Physician (General Surgery) Pershing Proud, RN as Oncology Nurse Navigator Donnelly Angelica, RN as Oncology Nurse Navigator Lowella Dell, MD OTHER MD:   CHIEF COMPLAINT: Estrogen receptor positive breast cancer  CURRENT TREATMENT: Adjuvant chemotherapy   INTERVAL HISTORY: Yolanda [name] returns today for follow up and treatment of her estrogen receptor positive breast cancer.    She completed 4 cycles of cyclophosphamide/doxorubicin 07/30/2020.  She has been off treatment for 3 weeks and feels ready to resume.  She is now starting her weekly paclitaxel treatments.  REVIEW OF SYSTEMS: Yolanda [name] is concerned that she continues to have that area of dehiscence in the anterior right breast.  She is controlling her sugars as best she can.  On direct questioning she denies any peripheral neuropathy symptoms.  She is trying to walk at least 30 minutes most days.  She has a good appetite normal taste and a little bit constipated now that she is off metformin and on Jardiance.  Detailed review of systems today was otherwise stable.   COVID 19 VACCINATION STATUS: fully vaccinated [employer]), with booster 12/2019   HISTORY OF CURRENT ILLNESS: From the original intake note:   I saw [name] in December 2018 for evaluation of atypical lobular hyperplasia.  We discussed various strategies regarding breast cancer prevention at that time for her to consider.    More recently (around the time of the Super Bowl 2022)  she found a lump in her right breast.  She underwent bilateral diagnostic mammography with tomography and right breast ultrasonography at [facility] on 04/10/2020 showing: breast density category C; palpable 3 cm mass in right breast at 12 o'clock; no enlarged adenopathy in right axilla.  Accordingly on the same day, she proceeded to biopsy of the right breast area in question. The pathology from this procedure ([id number]) showed: invasive ductal carcinoma, grade 3; ductal carcinoma in situ. Prognostic indicators significant for: estrogen receptor, 90% positive with moderate staining intensity and progesterone receptor, 0% negative. Proliferation marker Ki67 at 25%. HER2 equivocal by immunohistochemistry (2+), but negative by fluorescent in situ hybridization with a signals ratio 1.49 and number per cell 2.75.  Cancer Staging Malignant neoplasm of upper-outer quadrant of right breast in female, estrogen receptor positive (HCC) Staging form: Breast, AJCC 8th Edition - Clinical: Stage IIA (cT2, cN0, cM0, G3, ER+, PR+, HER2-) - Signed by Lowella Dell, MD on 04/18/2020  The patient's subsequent history is as detailed below.   PAST MEDICAL HISTORY: Past Medical History:  Diagnosis Date   Asthma    with respiratory infection   Cancer (HCC)    breast cancer - right   Diabetes mellitus without complication (HCC)    type II   Fatty liver    Fibroid uterus    Hypertension    Patient reports that she has never been diagnosied with HTN, "I take HCTZ for swelling in my feet, if needed."   Osteoarthritis 03/10/2015   PONV (postoperative nausea and vomiting)    2014   Refusal of blood transfusions as patient is Jehovah's Witness  NO BLOOD PRODUCTS   Sleep apnea     PAST SURGICAL HISTORY: Past Surgical History:  Procedure Laterality Date   BREAST CYST ASPIRATION     BREAST EXCISIONAL BIOPSY     BREAST LUMPECTOMY WITH SENTINEL LYMPH NODE BIOPSY Right 05/21/2020   Procedure: RIGHT  BREAST LUMPECTOMY WITH SENTINEL LYMPH NODE BIOPSY;  Surgeon: Stark Klein, MD;  Location: [place];  Service: General;  Laterality: Right;   CHOLECYSTECTOMY  1/11   COLONOSCOPY     DILATATION & CURRETTAGE/HYSTEROSCOPY WITH RESECTOCOPE N/A 03/13/2014   Procedure: DILATATION & CURETTAGE/HYSTEROSCOPY ;  Surgeon: Lyman Speller, MD;  Location: [facility];  Service: Gynecology;  Laterality: N/A;   PORTACATH PLACEMENT Left 05/21/2020   Procedure: INSERTION PORT-A-CATH;  Surgeon: Stark Klein, MD;  Location: [place];  Service: General;  Laterality: Left;   RADIOACTIVE SEED GUIDED EXCISIONAL BREAST BIOPSY Left 12/30/2016   Procedure: RADIOACTIVE SEED GUIDED EXCISIONAL BREAST BIOPSY;  Surgeon: Stark Klein, MD;  Location: [place];  Service: General;  Laterality: Left;    FAMILY HISTORY: Family History  Problem Relation Age of Onset   Asthma Mother    Diabetes Mother    Diabetes Father    Heart attack Father    Diabetes Brother    Diabetes Brother    Asthma Brother    Heart Problems Sister    Asthma Sister    Hypertension Sister    Heart disease Maternal Grandfather    Osteopenia Sister   The patient's father died from heart disease at the age of 68.  The patient's mother is 57 years old as of March 2022.  The patient has 4 brothers and 2 sisters.  She is not aware of any history of cancer in her family.   GYNECOLOGIC HISTORY:  Patient's last menstrual period was 02/21/2014. Menarche: 60 years old GX P 0 LMP age 28 Contraceptive 1 year, no complications HRT no  Hysterectomy? no BSO? 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  Her husband [name] works as a Marketing executive for [employer].  He has 2 children from a prior marriage, both living in [place].  One is traveling nurse.  The other 1 is a Administrator.  The patient is a Restaurant manager, fast food.    ADVANCED DIRECTIVES: In the absence of any documentation to the contrary, the patient's spouse  is their HCPOA.  The patient made it clear at her visit 04/18/2020 that she would not accept any blood transfusion or blood products for any reason including to stave off death   HEALTH MAINTENANCE: Social History   Tobacco Use   Smoking status: Never   Smokeless tobacco: Never  Vaping Use   Vaping Use: Never used  Substance Use Topics   Alcohol use: No   Drug use: No     Colonoscopy: 10/2019 (Dr. Collene Mares), recall 2026  PAP: 03/2017, negative  Bone density: 09/2018, -0.8   Allergies  Allergen Reactions   Lisinopril Shortness Of Breath and Cough    wheezing    Current Outpatient Medications  Medication Sig Dispense Refill   albuterol (PROVENTIL HFA;VENTOLIN HFA) 108 (90 Base) MCG/ACT inhaler Inhale 1-2 puffs into the lungs every 4 (four) hours as needed for wheezing or shortness of breath. 18 g 2   atorvastatin (LIPITOR) 10 MG tablet Take 10 mg by mouth every other day.     b complex vitamins capsule Take 1 capsule by mouth every other day.  B-D ULTRA-FINE 33 LANCETS MISC Check BG 2 times/day     Carboxymethylcellul-Glycerin (REFRESH OPTIVE OP) Place 1 drop into both eyes 3 (three) times daily as needed (dry / irritated eyes).     cholecalciferol (VITAMIN D3) 25 MCG (1000 UNIT) tablet Take 1,000 Units by mouth every other day.     ciprofloxacin (CIPRO) 500 MG tablet Take 1 tablet (500 mg total) by mouth 2 (two) times daily. Take for 5 days, then stop. Repeat May 24, June 7 and June 21 (Patient not taking: Reported on 08/20/2020) 40 tablet 1   doxycycline (VIBRA-TABS) 100 MG tablet Take 100 mg by mouth 2 (two) times daily. (Patient not taking: Reported on 08/20/2020)     empagliflozin (JARDIANCE) 10 MG TABS tablet Take 1 tablet (10 mg total) by mouth daily before breakfast. 30 tablet 6   fluticasone (FLONASE) 50 MCG/ACT nasal spray Place 1 spray into both nostrils daily as needed for allergies or rhinitis.     glucose blood test strip Check BG 2 times/day.  Dx E11.65      hydrochlorothiazide (HYDRODIURIL) 25 MG tablet Take 25 mg by mouth every other day.     lidocaine-prilocaine (EMLA) cream Apply to affected area once 30 g 3   loratadine (CLARITIN) 10 MG tablet Take 1 tablet (10 mg total) by mouth daily. 90 tablet 0   metFORMIN (GLUCOPHAGE-XR) 500 MG 24 hr tablet Take 2 tablets (1,000 mg total) by mouth 2 (two) times daily. 120 tablet 3   milk thistle 175 MG tablet Take 175 mg by mouth every other day.     Omega-3 Fatty Acids (OMEGA 3 PO) Take 1 capsule by mouth every other day.     omeprazole (PRILOSEC) 40 MG capsule Take 40 mg by mouth daily as needed (acid reflux).     prochlorperazine (COMPAZINE) 5 MG tablet Take 1 tablet (5 mg total) by mouth every 6 (six) hours as needed for nausea or vomiting. 30 tablet 0   valACYclovir (VALTREX) 500 MG tablet Take 1 tablet (500 mg total) by mouth daily. 60 tablet 1   vitamin C (ASCORBIC ACID) 500 MG tablet Take 500 mg by mouth every other day.     No current facility-administered medications for this visit.   Facility-Administered Medications Ordered in Other Visits  Medication Dose Route Frequency Provider Last Rate Last Admin   heparin lock flush 100 unit/mL  500 Units Intracatheter Once PRN Kentrail Shew, Virgie Dad, MD       sodium chloride flush (NS) 0.9 % injection 10 mL  10 mL Intracatheter PRN Candi Profit, Virgie Dad, MD       sodium chloride flush (NS) 0.9 % injection 10 mL  10 mL Intracatheter PRN Oakley Orban, Virgie Dad, MD       SUPPLEMENTS: Aside from the medications listed above, the patient takes a teaspoon of olive oil daily, 1000 mg of vitamin C every other day, sunflower lecithin 600 mg every other day, vitamin D3 5000 mg every other day, magnesium 200 mg every other day, milk thistle 150 mg every other day, a B complex vitamin (B6 and B12) every other day, as well as Osteo Bi-Flex weekly and fluticasone as needed  OBJECTIVE: Middle-aged woman in no acute distress  Vitals:   08/28/20 1203  BP: 129/88  Pulse: 84   Resp: 18  Temp: 97.8 F (36.6 C)  SpO2: 99%     Body mass index is 31.89 kg/m.   Wt Readings from Last 3 Encounters:  08/28/20 180 lb (81.6  kg)  08/20/20 179 lb (81.2 kg)  08/07/20 180 lb 9.6 oz (81.9 kg)     ECOG FS:1 - Symptomatic but completely ambulatory  Sclerae unicteric, EOMs intact Wearing a mask No cervical or supraclavicular adenopathy Lungs no rales or rhonchi Heart regular rate and rhythm Abd soft, nontender, positive bowel sounds MSK no focal spinal tenderness, no upper extremity lymphedema Neuro: nonfocal, well oriented, appropriate affect Breasts: The area of dehiscence in the right breast was evaluated.  There is no evidence of infection or inflammation.   LAB RESULTS:  CMP     Component Value Date/Time   NA 137 08/28/2020 1146   NA 138 11/04/2017 0000   K 4.1 08/28/2020 1146   CL 102 08/28/2020 1146   CO2 28 08/28/2020 1146   GLUCOSE 160 (H) 08/28/2020 1146   BUN 16 08/28/2020 1146   BUN 14 11/04/2017 0000   CREATININE 0.72 08/28/2020 1146   CREATININE 0.81 04/18/2020 1527   CREATININE 0.60 09/07/2014 0950   CALCIUM 10.3 08/28/2020 1146   PROT 7.0 08/28/2020 1146   ALBUMIN 3.6 08/28/2020 1146   AST 27 08/28/2020 1146   AST 26 04/18/2020 1527   ALT 32 08/28/2020 1146   ALT 37 04/18/2020 1527   ALKPHOS 131 (H) 08/28/2020 1146   BILITOT 0.2 (L) 08/28/2020 1146   BILITOT 0.3 04/18/2020 1527   GFRNONAA >60 08/28/2020 1146   GFRNONAA >60 04/18/2020 1527   GFRAA >60 05/15/2019 1803    No results found for: TOTALPROTELP, ALBUMINELP, A1GS, A2GS, BETS, BETA2SER, GAMS, MSPIKE, SPEI  Lab Results  Component Value Date   WBC 6.7 08/28/2020   NEUTROABS 3.7 08/28/2020   HGB 9.8 (L) 08/28/2020   HCT 30.7 (L) 08/28/2020   MCV 96.5 08/28/2020   PLT 270 08/28/2020    No results found for: LABCA2  No components found for: [id number]  No results for input(s): INR in the last 168 hours.  No results found for: LABCA2  No results found for:  HGD924  No results found for: QAS341  No results found for: DQQ229  No results found for: CA2729  No components found for: HGQUANT  No results found for: CEA1 / No results found for: CEA1   No results found for: AFPTUMOR  No results found for: CHROMOGRNA  No results found for: KPAFRELGTCHN, LAMBDASER, KAPLAMBRATIO (kappa/lambda light chains)  No results found for: HGBA, HGBA2QUANT, HGBFQUANT, HGBSQUAN (Hemoglobinopathy evaluation)   No results found for: LDH  Lab Results  Component Value Date   IRON 38 (L) 10/16/2009   IRONPCTSAT 10.2 (L) 10/16/2009   (Iron and TIBC)  Lab Results  Component Value Date   FERRITIN 8.9 (L) 10/16/2009    Urinalysis    Component Value Date/Time   COLORURINE YELLOW 03/11/2009 1100   APPEARANCEUR CLEAR 03/11/2009 1100   LABSPEC 1.017 03/11/2009 1100   PHURINE 7.0 03/11/2009 1100   GLUCOSEU NEGATIVE 03/11/2009 1100   HGBUR LARGE (A) 03/11/2009 1100   BILIRUBINUR N 05/21/2015 1614   KETONESUR NEGATIVE 03/11/2009 1100   PROTEINUR N 05/21/2015 1614   PROTEINUR NEGATIVE 03/11/2009 1100   UROBILINOGEN negative 05/21/2015 1614   UROBILINOGEN 0.2 03/11/2009 1100   NITRITE N 05/21/2015 1614   NITRITE NEGATIVE 03/11/2009 1100   LEUKOCYTESUR Negative 05/21/2015 1614    STUDIES: No results found.   ELIGIBLE FOR AVAILABLE RESEARCH PROTOCOL: no  ASSESSMENT: 60 y.o. Colp woman status post right breast upper outer quadrant biopsy 04/10/2020 for a clinical T2N0, stage IIA invasive ductal carcinoma, grade 3,  estrogen receptor positive, progesterone receptor and HER-2 negative, with an MIB-1 of 25%.  (1) Oncotype obtained from the original biopsy shows a score of 48, predicting a recurrence rate outside the breast within 9 years of 28% if the only systemic therapy is antiestrogen for 5 years.  It also predicts a greater than 15% benefit from chemotherapy.  (2) right lumpectomy and sentinel lymph node dissection 05/21/2020 shows a pT2  pN0, stage IIB invasive ductal carcinoma, grade 3, with negative margins.  (3) adjuvant chemotherapy to consist of cyclophosphamide and doxorubicin in dose dense fashion x4 starting 06/18/2020, with Neulasta day 3; to be followed by paclitaxel weekly x12 started 08/28/2020.  (a) echo 05/13/2020 shows an ejection fraction in the 55-60% range  (4) adjuvant radiation  (5) antiestrogens.   PLAN: Leeanna has recovered sufficiently from her initial chemotherapy to start the adjuvant paclitaxel.  Today we reviewed the possible toxicities side effects and complications of this agent and I particularly cautioned her regarding concerns of peripheral neuropathy particularly given the fact that she has diabetes.  That however appears to be getting under better control.  She is seeing a new endocrinologist and that seems to be making a big difference.  I will not be in town in a week so I will not see her on week 2 but I will see her again on her third week of treatment.  If she has any issues next week when she comes and she will tell the nurses that she needs to see a doctor before getting treated  Total encounter time 30 minutes.Sarajane Jews C. Alilah Mcmeans, MD 08/28/2020 4:00 PM Medical Oncology and Hematology [facility] [place], [place] [address] Tel. [phone]    Fax. [phone]   This document serves as a record of services personally performed by Lurline Del, MD. It was created on his behalf by Wilburn Mylar, a trained medical scribe. The creation of this record is based on the scribe's personal observations and the provider's statements to them.   I, Lurline Del MD, have reviewed the above documentation for accuracy and completeness, and I agree with the above.   *Total Encounter Time as defined by the Centers for Medicare and Medicaid Services includes, in addition to the face-to-face time of a patient visit (documented in the note above)  non-face-to-face time: obtaining and reviewing outside history, ordering and reviewing medications, tests or procedures, care coordination (communications with other health care professionals or caregivers) and documentation in the medical record.

## 2020-08-28 ENCOUNTER — Inpatient Hospital Stay: Payer: 59

## 2020-08-28 ENCOUNTER — Inpatient Hospital Stay (HOSPITAL_BASED_OUTPATIENT_CLINIC_OR_DEPARTMENT_OTHER): Payer: 59 | Admitting: Oncology

## 2020-08-28 ENCOUNTER — Other Ambulatory Visit: Payer: Self-pay

## 2020-08-28 VITALS — BP 129/88 | HR 84 | Temp 97.8°F | Resp 18 | Ht 63.0 in | Wt 180.0 lb

## 2020-08-28 VITALS — BP 127/70 | HR 68 | Temp 98.8°F | Resp 18

## 2020-08-28 DIAGNOSIS — Z170 Estrogen receptor positive status [ER+]: Secondary | ICD-10-CM

## 2020-08-28 DIAGNOSIS — C50411 Malignant neoplasm of upper-outer quadrant of right female breast: Secondary | ICD-10-CM | POA: Diagnosis present

## 2020-08-28 DIAGNOSIS — I959 Hypotension, unspecified: Secondary | ICD-10-CM | POA: Diagnosis not present

## 2020-08-28 DIAGNOSIS — E119 Type 2 diabetes mellitus without complications: Secondary | ICD-10-CM | POA: Diagnosis not present

## 2020-08-28 DIAGNOSIS — Z95828 Presence of other vascular implants and grafts: Secondary | ICD-10-CM

## 2020-08-28 DIAGNOSIS — Z5111 Encounter for antineoplastic chemotherapy: Secondary | ICD-10-CM | POA: Diagnosis present

## 2020-08-28 DIAGNOSIS — K5900 Constipation, unspecified: Secondary | ICD-10-CM | POA: Diagnosis not present

## 2020-08-28 LAB — CBC WITH DIFFERENTIAL/PLATELET
Abs Immature Granulocytes: 0.04 10*3/uL (ref 0.00–0.07)
Basophils Absolute: 0.1 10*3/uL (ref 0.0–0.1)
Basophils Relative: 1 %
Eosinophils Absolute: 0.4 10*3/uL (ref 0.0–0.5)
Eosinophils Relative: 6 %
HCT: 30.7 % — ABNORMAL LOW (ref 36.0–46.0)
Hemoglobin: 9.8 g/dL — ABNORMAL LOW (ref 12.0–15.0)
Immature Granulocytes: 1 %
Lymphocytes Relative: 22 %
Lymphs Abs: 1.5 10*3/uL (ref 0.7–4.0)
MCH: 30.8 pg (ref 26.0–34.0)
MCHC: 31.9 g/dL (ref 30.0–36.0)
MCV: 96.5 fL (ref 80.0–100.0)
Monocytes Absolute: 1 10*3/uL (ref 0.1–1.0)
Monocytes Relative: 15 %
Neutro Abs: 3.7 10*3/uL (ref 1.7–7.7)
Neutrophils Relative %: 55 %
Platelets: 270 10*3/uL (ref 150–400)
RBC: 3.18 MIL/uL — ABNORMAL LOW (ref 3.87–5.11)
RDW: 20.2 % — ABNORMAL HIGH (ref 11.5–15.5)
WBC: 6.7 10*3/uL (ref 4.0–10.5)
nRBC: 0 % (ref 0.0–0.2)

## 2020-08-28 LAB — COMPREHENSIVE METABOLIC PANEL
ALT: 32 U/L (ref 0–44)
AST: 27 U/L (ref 15–41)
Albumin: 3.6 g/dL (ref 3.5–5.0)
Alkaline Phosphatase: 131 U/L — ABNORMAL HIGH (ref 38–126)
Anion gap: 7 (ref 5–15)
BUN: 16 mg/dL (ref 8–23)
CO2: 28 mmol/L (ref 22–32)
Calcium: 10.3 mg/dL (ref 8.9–10.3)
Chloride: 102 mmol/L (ref 98–111)
Creatinine, Ser: 0.72 mg/dL (ref 0.44–1.00)
GFR, Estimated: >60 mL/min (ref >60)
Glucose, Bld: 160 mg/dL — ABNORMAL HIGH (ref 70–99)
Potassium: 4.1 mmol/L (ref 3.5–5.1)
Sodium: 137 mmol/L (ref 135–145)
Total Bilirubin: 0.2 mg/dL — ABNORMAL LOW (ref 0.3–1.2)
Total Protein: 7 g/dL (ref 6.5–8.1)

## 2020-08-28 MED ORDER — SODIUM CHLORIDE 0.9 % IV SOLN
Freq: Once | INTRAVENOUS | Status: AC
  Filled 2020-08-28: qty 250

## 2020-08-28 MED ORDER — DEXAMETHASONE SODIUM PHOSPHATE 100 MG/10ML IJ SOLN
20.0000 mg | Freq: Once | INTRAMUSCULAR | Status: AC
  Administered 2020-08-28: 20 mg via INTRAVENOUS
  Filled 2020-08-28: qty 20

## 2020-08-28 MED ORDER — DIPHENHYDRAMINE HCL 50 MG/ML IJ SOLN
INTRAMUSCULAR | Status: AC
  Filled 2020-08-28: qty 1

## 2020-08-28 MED ORDER — SODIUM CHLORIDE 0.9% FLUSH
10.0000 mL | INTRAVENOUS | Status: DC | PRN
  Administered 2020-08-28: 10 mL
  Filled 2020-08-28: qty 10

## 2020-08-28 MED ORDER — HEPARIN SOD (PORK) LOCK FLUSH 100 UNIT/ML IV SOLN
500.0000 [IU] | Freq: Once | INTRAVENOUS | Status: AC | PRN
  Administered 2020-08-28: 500 [IU]
  Filled 2020-08-28: qty 5

## 2020-08-28 MED ORDER — FAMOTIDINE 20 MG IN NS 100 ML IVPB
INTRAVENOUS | Status: AC
  Filled 2020-08-28: qty 100

## 2020-08-28 MED ORDER — DIPHENHYDRAMINE HCL 50 MG/ML IJ SOLN
25.0000 mg | Freq: Once | INTRAMUSCULAR | Status: AC
  Administered 2020-08-28: 25 mg via INTRAVENOUS

## 2020-08-28 MED ORDER — SODIUM CHLORIDE 0.9 % IV SOLN
80.0000 mg/m2 | Freq: Once | INTRAVENOUS | Status: AC
  Administered 2020-08-28: 162 mg via INTRAVENOUS
  Filled 2020-08-28: qty 27

## 2020-08-28 MED ORDER — FAMOTIDINE 20 MG IN NS 100 ML IVPB
20.0000 mg | Freq: Once | INTRAVENOUS | Status: AC
Start: 2020-08-28 — End: 2020-08-28
  Administered 2020-08-28: 20 mg via INTRAVENOUS

## 2020-08-28 NOTE — Patient Instructions (Signed)
[facility]  Discharge Instructions: Thank you for choosing [facility] to provide your oncology and hematology care.   If you have a lab appointment with the [facility], please go directly to the [facility] and check in at the registration area.   Wear comfortable clothing and clothing appropriate for easy access to any Portacath or PICC line.   We strive to give you quality time with your provider. You may need to reschedule your appointment if you arrive late (15 or more minutes).  Arriving late affects you and other patients whose appointments are after yours.  Also, if you miss three or more appointments without notifying the office, you may be dismissed from the clinic at the provider's discretion.      For prescription refill requests, have your pharmacy contact our office and allow 72 hours for refills to be completed.    Today you received the following chemotherapy and/or immunotherapy agents : Paclitaxel (Taxol)    To help prevent nausea and vomiting after your treatment, we encourage you to take your nausea medication as directed.  BELOW ARE SYMPTOMS THAT SHOULD BE REPORTED IMMEDIATELY: *FEVER GREATER THAN 100.4 F (38 C) OR HIGHER *CHILLS OR SWEATING *NAUSEA AND VOMITING THAT IS NOT CONTROLLED WITH YOUR NAUSEA MEDICATION *UNUSUAL SHORTNESS OF BREATH *UNUSUAL BRUISING OR BLEEDING *URINARY PROBLEMS (pain or burning when urinating, or frequent urination) *BOWEL PROBLEMS (unusual diarrhea, constipation, pain near the anus) TENDERNESS IN MOUTH AND THROAT WITH OR WITHOUT PRESENCE OF ULCERS (sore throat, sores in mouth, or a toothache) UNUSUAL RASH, SWELLING OR PAIN  UNUSUAL VAGINAL DISCHARGE OR ITCHING   Items with * indicate a potential emergency and should be followed up as soon as possible or go to the Emergency Department if any problems should occur.  Please show the CHEMOTHERAPY ALERT CARD or IMMUNOTHERAPY ALERT CARD at  check-in to the Emergency Department and triage nurse.  Should you have questions after your visit or need to cancel or reschedule your appointment, please contact [facility]  Dept: [phone]  and follow the prompts.  Office hours are 8:00 a.m. to 4:30 p.m. Monday - Friday. Please note that voicemails left after 4:00 p.m. may not be returned until the following business day.  We are closed weekends and major holidays. You have access to a nurse at all times for urgent questions. Please call the main number to the clinic Dept: [phone] and follow the prompts.   For any non-urgent questions, you may also contact your provider using MyChart. We now offer e-Visits for anyone 31 and older to request care online for non-urgent symptoms. For details visit [email or web address].   Also download the MyChart app! Go to the app store, search "MyChart", open the app, select [facility], and log in with your MyChart username and password.  Due to Covid, a mask is required upon entering the hospital/clinic. If you do not have a mask, one will be given to you upon arrival. For doctor visits, patients may have 1 support person aged 57 or older with them. For treatment visits, patients cannot have anyone with them due to current Covid guidelines and our immunocompromised population.   Paclitaxel injection What is this medication? PACLITAXEL (PAK li TAX el) is a chemotherapy drug. It targets fast dividing cells, like cancer cells, and causes these cells to die. This medicine is used to treat ovarian cancer, breast cancer, lung cancer, Kaposi's sarcoma, andother cancers. This medicine may be  used for other purposes; ask your health care provider orpharmacist if you have questions. COMMON BRAND NAME(S): Onxol, Taxol What should I tell my care team before I take this medication? They need to know if you have any of these conditions: history of irregular heartbeat liver  disease low blood counts, like low white cell, platelet, or red cell counts lung or breathing disease, like asthma tingling of the fingers or toes, or other nerve disorder an unusual or allergic reaction to paclitaxel, alcohol, polyoxyethylated castor oil, other chemotherapy, other medicines, foods, dyes, or preservatives pregnant or trying to get pregnant breast-feeding How should I use this medication? This drug is given as an infusion into a vein. It is administered in a hospitalor clinic by a specially trained health care professional. Talk to your pediatrician regarding the use of this medicine in children.Special care may be needed. Overdosage: If you think you have taken too much of this medicine contact apoison control center or emergency room at once. NOTE: This medicine is only for you. Do not share this medicine with others. What if I miss a dose? It is important not to miss your dose. Call your doctor or health careprofessional if you are unable to keep an appointment. What may interact with this medication? Do not take this medicine with any of the following medications: live virus vaccines This medicine may also interact with the following medications: antiviral medicines for hepatitis, HIV or AIDS certain antibiotics like erythromycin and clarithromycin certain medicines for fungal infections like ketoconazole and itraconazole certain medicines for seizures like carbamazepine, phenobarbital, phenytoin gemfibrozil nefazodone rifampin St. John's wort This list may not describe all possible interactions. Give your health care provider a list of all the medicines, herbs, non-prescription drugs, or dietary supplements you use. Also tell them if you smoke, drink alcohol, or use illegaldrugs. Some items may interact with your medicine. What should I watch for while using this medication? Your condition will be monitored carefully while you are receiving this medicine. You will  need important blood work done while you are taking thismedicine. This medicine can cause serious allergic reactions. To reduce your risk you will need to take other medicine(s) before treatment with this medicine. If you experience allergic reactions like skin rash, itching or hives, swelling of theface, lips, or tongue, tell your doctor or health care professional right away. In some cases, you may be given additional medicines to help with side effects.Follow all directions for their use. This drug may make you feel generally unwell. This is not uncommon, as chemotherapy can affect healthy cells as well as cancer cells. Report any side effects. Continue your course of treatment even though you feel ill unless yourdoctor tells you to stop. Call your doctor or health care professional for advice if you get a fever, chills or sore throat, or other symptoms of a cold or flu. Do not treat yourself. This drug decreases your body's ability to fight infections. Try toavoid being around people who are sick. This medicine may increase your risk to bruise or bleed. Call your doctor orhealth care professional if you notice any unusual bleeding. Be careful brushing and flossing your teeth or using a toothpick because you may get an infection or bleed more easily. If you have any dental work done,tell your dentist you are receiving this medicine. Avoid taking products that contain aspirin, acetaminophen, ibuprofen, naproxen, or ketoprofen unless instructed by your doctor. These medicines may hide afever. Do not become pregnant while taking this medicine.   Women should inform their doctor if they wish to become pregnant or think they might be pregnant. There is a potential for serious side effects to an unborn child. Talk to your health care professional or pharmacist for more information. Do not breast-feed aninfant while taking this medicine. Men are advised not to father a child while receiving this medicine. This  product may contain alcohol. Ask your pharmacist or healthcare provider if this medicine contains alcohol. Be sure to tell all healthcare providers you are taking this medicine. Certain medicines, like metronidazole and disulfiram, can cause an unpleasant reaction when taken with alcohol. The reaction includes flushing, headache, nausea, vomiting, sweating, and increased thirst. Thereaction can last from 30 minutes to several hours. What side effects may I notice from receiving this medication? Side effects that you should report to your doctor or health care professionalas soon as possible: allergic reactions like skin rash, itching or hives, swelling of the face, lips, or tongue breathing problems changes in vision fast, irregular heartbeat high or low blood pressure mouth sores pain, tingling, numbness in the hands or feet signs of decreased platelets or bleeding - bruising, pinpoint red spots on the skin, black, tarry stools, blood in the urine signs of decreased red blood cells - unusually weak or tired, feeling faint or lightheaded, falls signs of infection - fever or chills, cough, sore throat, pain or difficulty passing urine signs and symptoms of liver injury like dark yellow or brown urine; general ill feeling or flu-like symptoms; light-colored stools; loss of appetite; nausea; right upper belly pain; unusually weak or tired; yellowing of the eyes or skin swelling of the ankles, feet, hands unusually slow heartbeat Side effects that usually do not require medical attention (report to yourdoctor or health care professional if they continue or are bothersome): diarrhea hair loss loss of appetite muscle or joint pain nausea, vomiting pain, redness, or irritation at site where injected tiredness This list may not describe all possible side effects. Call your doctor for medical advice about side effects. You may report side effects to FDA at1-800-FDA-1088. Where should I keep my  medication? This drug is given in a hospital or clinic and will not be stored at home. NOTE: This sheet is a summary. It may not cover all possible information. If you have questions about this medicine, talk to your doctor, pharmacist, orhealth care provider.  2022 Elsevier/Gold Standard (2019-01-04 13:37:23)

## 2020-08-29 ENCOUNTER — Telehealth: Payer: Self-pay | Admitting: *Deleted

## 2020-08-30 ENCOUNTER — Telehealth: Payer: Self-pay | Admitting: Oncology

## 2020-08-30 NOTE — Telephone Encounter (Signed)
Scheduled appointments per 07/13 los. Patient will receive updated calender.

## 2020-09-03 ENCOUNTER — Other Ambulatory Visit: Payer: Self-pay

## 2020-09-03 ENCOUNTER — Inpatient Hospital Stay: Payer: 59

## 2020-09-03 ENCOUNTER — Encounter: Payer: Self-pay | Admitting: *Deleted

## 2020-09-03 VITALS — BP 138/89 | HR 86 | Temp 98.5°F | Resp 16

## 2020-09-03 DIAGNOSIS — Z170 Estrogen receptor positive status [ER+]: Secondary | ICD-10-CM

## 2020-09-03 DIAGNOSIS — C50411 Malignant neoplasm of upper-outer quadrant of right female breast: Secondary | ICD-10-CM

## 2020-09-03 DIAGNOSIS — Z5111 Encounter for antineoplastic chemotherapy: Secondary | ICD-10-CM | POA: Diagnosis not present

## 2020-09-03 DIAGNOSIS — Z95828 Presence of other vascular implants and grafts: Secondary | ICD-10-CM

## 2020-09-03 LAB — COMPREHENSIVE METABOLIC PANEL
ALT: 30 U/L (ref 0–44)
AST: 25 U/L (ref 15–41)
Albumin: 3.6 g/dL (ref 3.5–5.0)
Alkaline Phosphatase: 111 U/L (ref 38–126)
Anion gap: 7 (ref 5–15)
BUN: 16 mg/dL (ref 8–23)
CO2: 28 mmol/L (ref 22–32)
Calcium: 10.2 mg/dL (ref 8.9–10.3)
Chloride: 102 mmol/L (ref 98–111)
Creatinine, Ser: 0.73 mg/dL (ref 0.44–1.00)
GFR, Estimated: >60 mL/min (ref >60)
Glucose, Bld: 180 mg/dL — ABNORMAL HIGH (ref 70–99)
Potassium: 4 mmol/L (ref 3.5–5.1)
Sodium: 137 mmol/L (ref 135–145)
Total Bilirubin: 0.3 mg/dL (ref 0.3–1.2)
Total Protein: 6.8 g/dL (ref 6.5–8.1)

## 2020-09-03 LAB — CBC WITH DIFFERENTIAL/PLATELET
Abs Immature Granulocytes: 0.02 10*3/uL (ref 0.00–0.07)
Basophils Absolute: 0 10*3/uL (ref 0.0–0.1)
Basophils Relative: 1 %
Eosinophils Absolute: 0.3 10*3/uL (ref 0.0–0.5)
Eosinophils Relative: 6 %
HCT: 30.3 % — ABNORMAL LOW (ref 36.0–46.0)
Hemoglobin: 9.9 g/dL — ABNORMAL LOW (ref 12.0–15.0)
Immature Granulocytes: 0 %
Lymphocytes Relative: 21 %
Lymphs Abs: 1.1 10*3/uL (ref 0.7–4.0)
MCH: 31.3 pg (ref 26.0–34.0)
MCHC: 32.7 g/dL (ref 30.0–36.0)
MCV: 95.9 fL (ref 80.0–100.0)
Monocytes Absolute: 0.4 10*3/uL (ref 0.1–1.0)
Monocytes Relative: 7 %
Neutro Abs: 3.3 10*3/uL (ref 1.7–7.7)
Neutrophils Relative %: 65 %
Platelets: 269 10*3/uL (ref 150–400)
RBC: 3.16 MIL/uL — ABNORMAL LOW (ref 3.87–5.11)
RDW: 19.8 % — ABNORMAL HIGH (ref 11.5–15.5)
WBC: 5.2 10*3/uL (ref 4.0–10.5)
nRBC: 0 % (ref 0.0–0.2)

## 2020-09-03 MED ORDER — SODIUM CHLORIDE 0.9 % IV SOLN
80.0000 mg/m2 | Freq: Once | INTRAVENOUS | Status: AC
  Administered 2020-09-03: 162 mg via INTRAVENOUS
  Filled 2020-09-03: qty 27

## 2020-09-03 MED ORDER — HEPARIN SOD (PORK) LOCK FLUSH 100 UNIT/ML IV SOLN
500.0000 [IU] | Freq: Once | INTRAVENOUS | Status: AC | PRN
  Administered 2020-09-03: 500 [IU]
  Filled 2020-09-03: qty 5

## 2020-09-03 MED ORDER — DIPHENHYDRAMINE HCL 50 MG/ML IJ SOLN
25.0000 mg | Freq: Once | INTRAMUSCULAR | Status: AC
  Administered 2020-09-03: 25 mg via INTRAVENOUS

## 2020-09-03 MED ORDER — SODIUM CHLORIDE 0.9 % IV SOLN
Freq: Once | INTRAVENOUS | Status: AC
  Filled 2020-09-03: qty 250

## 2020-09-03 MED ORDER — SODIUM CHLORIDE 0.9 % IV SOLN
20.0000 mg | Freq: Once | INTRAVENOUS | Status: AC
  Administered 2020-09-03: 20 mg via INTRAVENOUS
  Filled 2020-09-03: qty 20

## 2020-09-03 MED ORDER — FAMOTIDINE 20 MG IN NS 100 ML IVPB
INTRAVENOUS | Status: AC
  Filled 2020-09-03: qty 100

## 2020-09-03 MED ORDER — SODIUM CHLORIDE 0.9% FLUSH
10.0000 mL | INTRAVENOUS | Status: DC | PRN
  Administered 2020-09-03: 10 mL
  Filled 2020-09-03: qty 10

## 2020-09-03 MED ORDER — FAMOTIDINE 20 MG IN NS 100 ML IVPB
20.0000 mg | Freq: Once | INTRAVENOUS | Status: AC
  Administered 2020-09-03: 20 mg via INTRAVENOUS

## 2020-09-03 MED ORDER — DIPHENHYDRAMINE HCL 50 MG/ML IJ SOLN
INTRAMUSCULAR | Status: AC
  Filled 2020-09-03: qty 1

## 2020-09-03 NOTE — Patient Instructions (Signed)
[name]  Discharge Instructions: Thank you for choosing [facility] to provide your oncology and hematology care.   If you have a lab appointment with the [facility], please go directly to the [facility] and check in at the registration area.   Wear comfortable clothing and clothing appropriate for easy access to any Portacath or PICC line.   We strive to give you quality time with your provider. You may need to reschedule your appointment if you arrive late (15 or more minutes).  Arriving late affects you and other patients whose appointments are after yours.  Also, if you miss three or more appointments without notifying the office, you may be dismissed from the clinic at the provider's discretion.      For prescription refill requests, have your pharmacy contact our office and allow 72 hours for refills to be completed.    Today you received the following chemotherapy and/or immunotherapy agents : Paclitaxel (Taxol)    To help prevent nausea and vomiting after your treatment, we encourage you to take your nausea medication as directed.  BELOW ARE SYMPTOMS THAT SHOULD BE REPORTED IMMEDIATELY: *FEVER GREATER THAN 100.4 F (38 C) OR HIGHER *CHILLS OR SWEATING *NAUSEA AND VOMITING THAT IS NOT CONTROLLED WITH YOUR NAUSEA MEDICATION *UNUSUAL SHORTNESS OF BREATH *UNUSUAL BRUISING OR BLEEDING *URINARY PROBLEMS (pain or burning when urinating, or frequent urination) *BOWEL PROBLEMS (unusual diarrhea, constipation, pain near the anus) TENDERNESS IN MOUTH AND THROAT WITH OR WITHOUT PRESENCE OF ULCERS (sore throat, sores in mouth, or a toothache) UNUSUAL RASH, SWELLING OR PAIN  UNUSUAL VAGINAL DISCHARGE OR ITCHING   Items with * indicate a potential emergency and should be followed up as soon as possible or go to the Emergency Department if any problems should occur.  Please show the CHEMOTHERAPY ALERT CARD or IMMUNOTHERAPY ALERT CARD at  check-in to the Emergency Department and triage nurse.  Should you have questions after your visit or need to cancel or reschedule your appointment, please contact [facility]  Dept: [phone]  and follow the prompts.  Office hours are 8:00 a.m. to 4:30 p.m. Monday - Friday. Please note that voicemails left after 4:00 p.m. may not be returned until the following business day.  We are closed weekends and major holidays. You have access to a nurse at all times for urgent questions. Please call the main number to the clinic Dept: [phone] and follow the prompts.   For any non-urgent questions, you may also contact your provider using MyChart. We now offer e-Visits for anyone 36 and older to request care online for non-urgent symptoms. For details visit [email or web address].   Also download the MyChart app! Go to the app store, search "MyChart", open the app, select , and log in with your MyChart username and password.  Due to Covid, a mask is required upon entering the hospital/clinic. If you do not have a mask, one will be given to you upon arrival. For doctor visits, patients may have 1 support person aged 16 or older with them. For treatment visits, patients cannot have anyone with them due to current Covid guidelines and our immunocompromised population.   Paclitaxel injection What is this medication? PACLITAXEL (PAK li TAX el) is a chemotherapy drug. It targets fast dividing cells, like cancer cells, and causes these cells to die. This medicine is used to treat ovarian cancer, breast cancer, lung cancer, Kaposi's sarcoma, andother cancers. This medicine may be  used for other purposes; ask your health care provider orpharmacist if you have questions. COMMON BRAND NAME(S): Onxol, Taxol What should I tell my care team before I take this medication? They need to know if you have any of these conditions: history of irregular heartbeat liver  disease low blood counts, like low white cell, platelet, or red cell counts lung or breathing disease, like asthma tingling of the fingers or toes, or other nerve disorder an unusual or allergic reaction to paclitaxel, alcohol, polyoxyethylated castor oil, other chemotherapy, other medicines, foods, dyes, or preservatives pregnant or trying to get pregnant breast-feeding How should I use this medication? This drug is given as an infusion into a vein. It is administered in a hospitalor clinic by a specially trained health care professional. Talk to your pediatrician regarding the use of this medicine in children.Special care may be needed. Overdosage: If you think you have taken too much of this medicine contact apoison control center or emergency room at once. NOTE: This medicine is only for you. Do not share this medicine with others. What if I miss a dose? It is important not to miss your dose. Call your doctor or health careprofessional if you are unable to keep an appointment. What may interact with this medication? Do not take this medicine with any of the following medications: live virus vaccines This medicine may also interact with the following medications: antiviral medicines for hepatitis, HIV or AIDS certain antibiotics like erythromycin and clarithromycin certain medicines for fungal infections like ketoconazole and itraconazole certain medicines for seizures like carbamazepine, phenobarbital, phenytoin gemfibrozil nefazodone rifampin St. John's wort This list may not describe all possible interactions. Give your health care provider a list of all the medicines, herbs, non-prescription drugs, or dietary supplements you use. Also tell them if you smoke, drink alcohol, or use illegaldrugs. Some items may interact with your medicine. What should I watch for while using this medication? Your condition will be monitored carefully while you are receiving this medicine. You will  need important blood work done while you are taking thismedicine. This medicine can cause serious allergic reactions. To reduce your risk you will need to take other medicine(s) before treatment with this medicine. If you experience allergic reactions like skin rash, itching or hives, swelling of theface, lips, or tongue, tell your doctor or health care professional right away. In some cases, you may be given additional medicines to help with side effects.Follow all directions for their use. This drug may make you feel generally unwell. This is not uncommon, as chemotherapy can affect healthy cells as well as cancer cells. Report any side effects. Continue your course of treatment even though you feel ill unless yourdoctor tells you to stop. Call your doctor or health care professional for advice if you get a fever, chills or sore throat, or other symptoms of a cold or flu. Do not treat yourself. This drug decreases your body's ability to fight infections. Try toavoid being around people who are sick. This medicine may increase your risk to bruise or bleed. Call your doctor orhealth care professional if you notice any unusual bleeding. Be careful brushing and flossing your teeth or using a toothpick because you may get an infection or bleed more easily. If you have any dental work done,tell your dentist you are receiving this medicine. Avoid taking products that contain aspirin, acetaminophen, ibuprofen, naproxen, or ketoprofen unless instructed by your doctor. These medicines may hide afever. Do not become pregnant while taking this medicine.   Women should inform their doctor if they wish to become pregnant or think they might be pregnant. There is a potential for serious side effects to an unborn child. Talk to your health care professional or pharmacist for more information. Do not breast-feed aninfant while taking this medicine. Men are advised not to father a child while receiving this medicine. This  product may contain alcohol. Ask your pharmacist or healthcare provider if this medicine contains alcohol. Be sure to tell all healthcare providers you are taking this medicine. Certain medicines, like metronidazole and disulfiram, can cause an unpleasant reaction when taken with alcohol. The reaction includes flushing, headache, nausea, vomiting, sweating, and increased thirst. Thereaction can last from 30 minutes to several hours. What side effects may I notice from receiving this medication? Side effects that you should report to your doctor or health care professionalas soon as possible: allergic reactions like skin rash, itching or hives, swelling of the face, lips, or tongue breathing problems changes in vision fast, irregular heartbeat high or low blood pressure mouth sores pain, tingling, numbness in the hands or feet signs of decreased platelets or bleeding - bruising, pinpoint red spots on the skin, black, tarry stools, blood in the urine signs of decreased red blood cells - unusually weak or tired, feeling faint or lightheaded, falls signs of infection - fever or chills, cough, sore throat, pain or difficulty passing urine signs and symptoms of liver injury like dark yellow or brown urine; general ill feeling or flu-like symptoms; light-colored stools; loss of appetite; nausea; right upper belly pain; unusually weak or tired; yellowing of the eyes or skin swelling of the ankles, feet, hands unusually slow heartbeat Side effects that usually do not require medical attention (report to yourdoctor or health care professional if they continue or are bothersome): diarrhea hair loss loss of appetite muscle or joint pain nausea, vomiting pain, redness, or irritation at site where injected tiredness This list may not describe all possible side effects. Call your doctor for medical advice about side effects. You may report side effects to FDA at1-800-FDA-1088. Where should I keep my  medication? This drug is given in a hospital or clinic and will not be stored at home. NOTE: This sheet is a summary. It may not cover all possible information. If you have questions about this medicine, talk to your doctor, pharmacist, orhealth care provider.  2022 Elsevier/Gold Standard (2019-01-04 13:37:23)

## 2020-09-09 NOTE — Progress Notes (Signed)
[place]  [phone] [phone]     ID: Yolanda [name] DOB: 12/11/1960  MR#: [id number]  PTW#:[id number]  Patient Care Team: Yolanda Lukes, MD as PCP - General (Family Medicine) Yolanda Bickers, MD as Consulting Physician (Infectious Diseases) Yolanda Salon, MD as Consulting Physician (Gynecology) Yolanda [name], Yolanda Dad, MD as Consulting Physician (Oncology) Yolanda Craver, MD as Consulting Physician (Gastroenterology) Yolanda Klein, MD as Consulting Physician (General Surgery) Yolanda Kaufmann, RN as Oncology Nurse Navigator Yolanda Germany, RN as Oncology Nurse Navigator Yolanda Cruel, MD OTHER MD:   CHIEF COMPLAINT: Estrogen receptor positive breast cancer  CURRENT TREATMENT: Adjuvant chemotherapy   INTERVAL HISTORY: Yolanda [name] returns today for follow up and treatment of her estrogen receptor positive breast cancer.  She is accompanied by her husband Yolanda [name]  She completed 4 cycles of cyclophosphamide and doxorubicin and started weekly paclitaxel treatments on 08/26/2020. Today is day 1 cycle 3   REVIEW OF SYSTEMS: Yolanda [name] is doing generally well with her Taxol.  She does complain of feeling tired although she takes a little walks most days and does a little bit of exercise at home.  They have trouble getting into her port.  She has felt a little bit dizzy and her blood pressure has been down she thinks possibly because of the Jardiance but chemotherapy also can lower blood pressure.  She has had accommodation issues.  Her vision can be blurry.  She has constipation which she is treating with MiraLAX.  She continues to have discomfort associated with her area of dehiscence in the right breast.  There have been no peripheral neuropathy symptoms.  Aside from that a detailed review of systems today was negative.   COVID 19 VACCINATION STATUS: fully vaccinated [employer]), with booster 12/2019   HISTORY OF CURRENT ILLNESS: From the original intake note:    I saw Yolanda [name] in December 2018 for evaluation of atypical lobular hyperplasia.  We discussed various strategies regarding breast cancer prevention at that time for her to consider.    More recently (around the time of the Super Bowl 2022) she found a lump in her right breast.  She underwent bilateral diagnostic mammography with tomography and right breast ultrasonography at [facility] on 04/10/2020 showing: breast density category C; palpable 3 cm mass in right breast at 12 o'clock; no enlarged adenopathy in right axilla.  Accordingly on the same day, she proceeded to biopsy of the right breast area in question. The pathology from this procedure ([id number]) showed: invasive ductal carcinoma, grade 3; ductal carcinoma in situ. Prognostic indicators significant for: estrogen receptor, 90% positive with moderate staining intensity and progesterone receptor, 0% negative. Proliferation marker Ki67 at 25%. HER2 equivocal by immunohistochemistry (2+), but negative by fluorescent in situ hybridization with a signals ratio 1.49 and number per cell 2.75.  Cancer Staging Malignant neoplasm of upper-outer quadrant of right breast in female, estrogen receptor positive ([place]) Staging form: Breast, AJCC 8th Edition - Clinical: Stage IIA (cT2, cN0, cM0, G3, ER+, PR+, HER2-) - Signed by Yolanda Cruel, MD on 04/18/2020  The patient's subsequent history is as detailed below.   PAST MEDICAL HISTORY: Past Medical History:  Diagnosis Date   Asthma    with respiratory infection   Cancer ([place])    breast cancer - right   Diabetes mellitus without complication ([place])    type II   Fatty liver    Fibroid uterus    Hypertension    Patient reports that  she has never been diagnosied with HTN, "I take HCTZ for swelling in my feet, if needed."   Osteoarthritis 03/10/2015   PONV (postoperative nausea and vomiting)    2014   Refusal of blood transfusions as patient is Jehovah's Witness    NO BLOOD  PRODUCTS   Sleep apnea     PAST SURGICAL HISTORY: Past Surgical History:  Procedure Laterality Date   BREAST CYST ASPIRATION     BREAST EXCISIONAL BIOPSY     BREAST LUMPECTOMY WITH SENTINEL LYMPH NODE BIOPSY Right 05/21/2020   Procedure: RIGHT BREAST LUMPECTOMY WITH SENTINEL LYMPH NODE BIOPSY;  Surgeon: Yolanda Klein, MD;  Location: [facility];  Service: General;  Laterality: Right;   CHOLECYSTECTOMY  1/11   COLONOSCOPY     DILATATION & CURRETTAGE/HYSTEROSCOPY WITH RESECTOCOPE N/A 03/13/2014   Procedure: DILATATION & CURETTAGE/HYSTEROSCOPY ;  Surgeon: Yolanda Speller, MD;  Location: [facility];  Service: Gynecology;  Laterality: N/A;   PORTACATH PLACEMENT Left 05/21/2020   Procedure: INSERTION PORT-A-CATH;  Surgeon: Yolanda Klein, MD;  Location: [facility];  Service: General;  Laterality: Left;   RADIOACTIVE SEED GUIDED EXCISIONAL BREAST BIOPSY Left 12/30/2016   Procedure: RADIOACTIVE SEED GUIDED EXCISIONAL BREAST BIOPSY;  Surgeon: Yolanda Klein, MD;  Location: [place];  Service: General;  Laterality: Left;    FAMILY HISTORY: Family History  Problem Relation Age of Onset   Asthma Mother    Diabetes Mother    Diabetes Father    Heart attack Father    Diabetes Brother    Diabetes Brother    Asthma Brother    Heart Problems Sister    Asthma Sister    Hypertension Sister    Heart disease Maternal Grandfather    Osteopenia Sister   The patient's father died from heart disease at the age of 82.  The patient's mother is 58 years old as of March 2022.  The patient has 4 brothers and 2 sisters.  She is not aware of any history of cancer in her family.   GYNECOLOGIC HISTORY:  Patient's last menstrual period was 02/21/2014. Menarche: 60 years old GX P 0 LMP age 18 Contraceptive 1 year, no complications HRT no  Hysterectomy? no BSO? 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  Her husband Yolanda [name] works as a Marketing executive for [employer].  He has 2  children from a prior marriage, both living in [place].  One is traveling nurse.  The other 1 is a Administrator.  The patient is a Restaurant manager, fast food.    ADVANCED DIRECTIVES: In the absence of any documentation to the contrary, the patient's spouse is their HCPOA.  The patient made it clear at her visit 04/18/2020 that she would not accept any blood transfusion or blood products for any reason including to stave off death   HEALTH MAINTENANCE: Social History   Tobacco Use   Smoking status: Never   Smokeless tobacco: Never  Vaping Use   Vaping Use: Never used  Substance Use Topics   Alcohol use: No   Drug use: No     Colonoscopy: 10/2019 (Dr. Collene Mares), recall 2026  PAP: 03/2017, negative  Bone density: 09/2018, -0.8   Allergies  Allergen Reactions   Lisinopril Shortness Of Breath and Cough    wheezing    Current Outpatient Medications  Medication Sig Dispense Refill   albuterol (PROVENTIL HFA;VENTOLIN HFA) 108 (90 Base) MCG/ACT inhaler Inhale 1-2 puffs into the lungs every 4 (four) hours  as needed for wheezing or shortness of breath. 18 g 2   atorvastatin (LIPITOR) 10 MG tablet Take 10 mg by mouth every other day.     b complex vitamins capsule Take 1 capsule by mouth every other day.     B-D ULTRA-FINE 33 LANCETS MISC Check BG 2 times/day     Carboxymethylcellul-Glycerin (REFRESH OPTIVE OP) Place 1 drop into both eyes 3 (three) times daily as needed (dry / irritated eyes).     cholecalciferol (VITAMIN D3) 25 MCG (1000 UNIT) tablet Take 1,000 Units by mouth every other day.     doxycycline (VIBRA-TABS) 100 MG tablet Take 100 mg by mouth 2 (two) times daily. (Patient not taking: Reported on 08/20/2020)     empagliflozin (JARDIANCE) 10 MG TABS tablet Take 1 tablet (10 mg total) by mouth daily before breakfast. 30 tablet 6   fluticasone (FLONASE) 50 MCG/ACT nasal spray Place 1 spray into both nostrils daily as needed for allergies or rhinitis.     glucose blood test strip Check BG 2  times/day.  Dx E11.65     lidocaine-prilocaine (EMLA) cream Apply to affected area once 30 g 3   loratadine (CLARITIN) 10 MG tablet Take 1 tablet (10 mg total) by mouth daily. 90 tablet 0   metFORMIN (GLUCOPHAGE-XR) 500 MG 24 hr tablet Take 2 tablets (1,000 mg total) by mouth 2 (two) times daily. 120 tablet 3   milk thistle 175 MG tablet Take 175 mg by mouth every other day.     Omega-3 Fatty Acids (OMEGA 3 PO) Take 1 capsule by mouth every other day.     omeprazole (PRILOSEC) 40 MG capsule Take 40 mg by mouth daily as needed (acid reflux).     prochlorperazine (COMPAZINE) 5 MG tablet Take 1 tablet (5 mg total) by mouth every 6 (six) hours as needed for nausea or vomiting. 30 tablet 0   valACYclovir (VALTREX) 500 MG tablet Take 1 tablet (500 mg total) by mouth daily. 60 tablet 1   vitamin C (ASCORBIC ACID) 500 MG tablet Take 500 mg by mouth every other day.     No current facility-administered medications for this visit.   Facility-Administered Medications Ordered in Other Visits  Medication Dose Route Frequency Provider Last Rate Last Admin   sodium chloride flush (NS) 0.9 % injection 10 mL  10 mL Intracatheter PRN Ahmani Daoud, Yolanda Dad, MD       SUPPLEMENTS: Aside from the medications listed above, the patient takes a teaspoon of olive oil daily, 1000 mg of vitamin C every other day, sunflower lecithin 600 mg every other day, vitamin D3 5000 mg every other day, magnesium 200 mg every other day, milk thistle 150 mg every other day, a B complex vitamin (B6 and B12) every other day, as well as Osteo Bi-Flex weekly and fluticasone as needed  OBJECTIVE: Middle-aged woman   Vitals:   09/10/20 0805  BP: 126/76  Pulse: 83  Resp: 18  Temp: 97.7 F (36.5 C)  SpO2: 100%     Body mass index is 31.89 kg/m.   Wt Readings from Last 3 Encounters:  09/10/20 180 lb (81.6 kg)  08/28/20 180 lb (81.6 kg)  08/20/20 179 lb (81.2 kg)     ECOG FS:1 - Symptomatic but completely ambulatory  Has lost her  eyebrows and eyelashes in addition to her hair Sclerae unicteric, EOMs intact Wearing a mask No cervical or supraclavicular adenopathy Lungs no rales or rhonchi Heart regular rate and rhythm Abd soft, nontender,  positive bowel sounds MSK no focal spinal tenderness, no upper extremity lymphedema Neuro: nonfocal, well oriented, appropriate affect Breasts: Area of dehiscence is unchanged, with a pink-yellow base, no evidence of infection.   LAB RESULTS:  CMP     Component Value Date/Time   NA 137 09/03/2020 0939   NA 138 11/04/2017 0000   K 4.0 09/03/2020 0939   CL 102 09/03/2020 0939   CO2 28 09/03/2020 0939   GLUCOSE 180 (H) 09/03/2020 0939   BUN 16 09/03/2020 0939   BUN 14 11/04/2017 0000   CREATININE 0.73 09/03/2020 0939   CREATININE 0.81 04/18/2020 1527   CREATININE 0.60 09/07/2014 0950   CALCIUM 10.2 09/03/2020 0939   PROT 6.8 09/03/2020 0939   ALBUMIN 3.6 09/03/2020 0939   AST 25 09/03/2020 0939   AST 26 04/18/2020 1527   ALT 30 09/03/2020 0939   ALT 37 04/18/2020 1527   ALKPHOS 111 09/03/2020 0939   BILITOT 0.3 09/03/2020 0939   BILITOT 0.3 04/18/2020 1527   GFRNONAA >60 09/03/2020 0939   GFRNONAA >60 04/18/2020 1527   GFRAA >60 05/15/2019 1803    No results found for: TOTALPROTELP, ALBUMINELP, A1GS, A2GS, BETS, BETA2SER, GAMS, MSPIKE, SPEI  Lab Results  Component Value Date   WBC 5.1 09/10/2020   NEUTROABS 3.2 09/10/2020   HGB 10.2 (L) 09/10/2020   HCT 31.1 (L) 09/10/2020   MCV 96.9 09/10/2020   PLT 266 09/10/2020    No results found for: LABCA2  No components found for: [id number]  No results for input(s): INR in the last 168 hours.  No results found for: LABCA2  No results found for: ZWC585  No results found for: IDP824  No results found for: MPN361  No results found for: CA2729  No components found for: HGQUANT  No results found for: CEA1 / No results found for: CEA1   No results found for: AFPTUMOR  No results found for:  CHROMOGRNA  No results found for: KPAFRELGTCHN, LAMBDASER, KAPLAMBRATIO (kappa/lambda light chains)  No results found for: HGBA, HGBA2QUANT, HGBFQUANT, HGBSQUAN (Hemoglobinopathy evaluation)   No results found for: LDH  Lab Results  Component Value Date   IRON 38 (L) 10/16/2009   IRONPCTSAT 10.2 (L) 10/16/2009   (Iron and TIBC)  Lab Results  Component Value Date   FERRITIN 8.9 (L) 10/16/2009    Urinalysis    Component Value Date/Time   COLORURINE YELLOW 03/11/2009 1100   APPEARANCEUR CLEAR 03/11/2009 1100   LABSPEC 1.017 03/11/2009 1100   PHURINE 7.0 03/11/2009 1100   GLUCOSEU NEGATIVE 03/11/2009 1100   HGBUR LARGE (A) 03/11/2009 1100   BILIRUBINUR N 05/21/2015 1614   KETONESUR NEGATIVE 03/11/2009 1100   PROTEINUR N 05/21/2015 1614   PROTEINUR NEGATIVE 03/11/2009 1100   UROBILINOGEN negative 05/21/2015 1614   UROBILINOGEN 0.2 03/11/2009 1100   NITRITE N 05/21/2015 1614   NITRITE NEGATIVE 03/11/2009 1100   LEUKOCYTESUR Negative 05/21/2015 1614    STUDIES: No results found.   ELIGIBLE FOR AVAILABLE RESEARCH PROTOCOL: no  ASSESSMENT: 60 y.o. [place] woman status post right breast upper outer quadrant biopsy 04/10/2020 for a clinical T2N0, stage IIA invasive ductal carcinoma, grade 3, estrogen receptor positive, progesterone receptor and HER-2 negative, with an MIB-1 of 25%.  (1) Oncotype obtained from the original biopsy shows a score of 48, predicting a recurrence rate outside the breast within 9 years of 28% if the only systemic therapy is antiestrogen for 5 years.  It also predicts a greater than 15% benefit from chemotherapy.  (  2) right lumpectomy and sentinel lymph node dissection 05/21/2020 shows a pT2 pN0, stage IIB invasive ductal carcinoma, grade 3, with negative margins.  (3) adjuvant chemotherapy to consist of cyclophosphamide and doxorubicin in dose dense fashion x4 starting 06/18/2020, with Neulasta day 3; to be followed by paclitaxel weekly x12  started 08/28/2020.  (a) echo 05/13/2020 shows an ejection fraction in the 55-60% range  (4) adjuvant radiation  (5) antiestrogens.   PLAN: Yolanda [name] is tolerating paclitaxel generally well and certainly there are no symptoms of peripheral neuropathy.  We are proceeding with treatment today.  She will continue to be treated on a weekly basis and I will see her every other week but she has been alerted to let us know if peripheral neuropathy develops at any point.  We are stopping the hydrochlorothiazide given her lower blood pressure.  I suggested she add stool softeners to her daily MiraLAX to get rid of the constipation issue.  I think she would benefit from wet-to-dry dressings to the very shallow area of dehiscence in the right breast and we will show her how to do that today.  Otherwise I am very pleased to help with how well she is tolerating treatment and she will see me again in 2 weeks  Total encounter time 35 minutes.Yolanda Jews C. Brandee Markin, MD 09/10/2020 8:37 AM Medical Oncology and Hematology [facility] [place], [place] [address] Tel. [phone]    Fax. [phone]   This document serves as a record of services personally performed by Lurline Del, MD. It was created on his behalf by Yolanda [name], a trained medical scribe. The creation of this record is based on the scribe's personal observations and the provider's statements to them.   I, Lurline Del MD, have reviewed the above documentation for accuracy and completeness, and I agree with the above.   *Total Encounter Time as defined by the Centers for Medicare and Medicaid Services includes, in addition to the face-to-face time of a patient visit (documented in the note above) non-face-to-face time: obtaining and reviewing outside history, ordering and reviewing medications, tests or procedures, care coordination (communications with other health care professionals or caregivers) and  documentation in the medical record.

## 2020-09-10 ENCOUNTER — Other Ambulatory Visit: Payer: Self-pay

## 2020-09-10 ENCOUNTER — Inpatient Hospital Stay: Payer: 59

## 2020-09-10 ENCOUNTER — Inpatient Hospital Stay (HOSPITAL_BASED_OUTPATIENT_CLINIC_OR_DEPARTMENT_OTHER): Payer: 59 | Admitting: Oncology

## 2020-09-10 VITALS — BP 126/76 | HR 83 | Temp 97.7°F | Resp 18 | Ht 63.0 in | Wt 180.0 lb

## 2020-09-10 DIAGNOSIS — Z95828 Presence of other vascular implants and grafts: Secondary | ICD-10-CM

## 2020-09-10 DIAGNOSIS — Z5111 Encounter for antineoplastic chemotherapy: Secondary | ICD-10-CM | POA: Diagnosis not present

## 2020-09-10 DIAGNOSIS — C50411 Malignant neoplasm of upper-outer quadrant of right female breast: Secondary | ICD-10-CM

## 2020-09-10 DIAGNOSIS — Z170 Estrogen receptor positive status [ER+]: Secondary | ICD-10-CM | POA: Diagnosis not present

## 2020-09-10 LAB — COMPREHENSIVE METABOLIC PANEL
ALT: 28 U/L (ref 0–44)
AST: 23 U/L (ref 15–41)
Albumin: 3.6 g/dL (ref 3.5–5.0)
Alkaline Phosphatase: 127 U/L — ABNORMAL HIGH (ref 38–126)
Anion gap: 11 (ref 5–15)
BUN: 15 mg/dL (ref 8–23)
CO2: 26 mmol/L (ref 22–32)
Calcium: 10.4 mg/dL — ABNORMAL HIGH (ref 8.9–10.3)
Chloride: 101 mmol/L (ref 98–111)
Creatinine, Ser: 0.74 mg/dL (ref 0.44–1.00)
GFR, Estimated: >60 mL/min (ref >60)
Glucose, Bld: 156 mg/dL — ABNORMAL HIGH (ref 70–99)
Potassium: 4 mmol/L (ref 3.5–5.1)
Sodium: 138 mmol/L (ref 135–145)
Total Bilirubin: 0.3 mg/dL (ref 0.3–1.2)
Total Protein: 7.1 g/dL (ref 6.5–8.1)

## 2020-09-10 LAB — CBC WITH DIFFERENTIAL/PLATELET
Abs Immature Granulocytes: 0.02 10*3/uL (ref 0.00–0.07)
Basophils Absolute: 0 10*3/uL (ref 0.0–0.1)
Basophils Relative: 1 %
Eosinophils Absolute: 0.3 10*3/uL (ref 0.0–0.5)
Eosinophils Relative: 6 %
HCT: 31.1 % — ABNORMAL LOW (ref 36.0–46.0)
Hemoglobin: 10.2 g/dL — ABNORMAL LOW (ref 12.0–15.0)
Immature Granulocytes: 0 %
Lymphocytes Relative: 21 %
Lymphs Abs: 1.1 10*3/uL (ref 0.7–4.0)
MCH: 31.8 pg (ref 26.0–34.0)
MCHC: 32.8 g/dL (ref 30.0–36.0)
MCV: 96.9 fL (ref 80.0–100.0)
Monocytes Absolute: 0.4 10*3/uL (ref 0.1–1.0)
Monocytes Relative: 8 %
Neutro Abs: 3.2 10*3/uL (ref 1.7–7.7)
Neutrophils Relative %: 64 %
Platelets: 266 10*3/uL (ref 150–400)
RBC: 3.21 MIL/uL — ABNORMAL LOW (ref 3.87–5.11)
RDW: 19.3 % — ABNORMAL HIGH (ref 11.5–15.5)
WBC: 5.1 10*3/uL (ref 4.0–10.5)
nRBC: 0 % (ref 0.0–0.2)

## 2020-09-10 MED ORDER — HEPARIN SOD (PORK) LOCK FLUSH 100 UNIT/ML IV SOLN
500.0000 [IU] | Freq: Once | INTRAVENOUS | Status: AC | PRN
  Administered 2020-09-10: 500 [IU]
  Filled 2020-09-10: qty 5

## 2020-09-10 MED ORDER — FAMOTIDINE 20 MG IN NS 100 ML IVPB
INTRAVENOUS | Status: AC
  Filled 2020-09-10: qty 100

## 2020-09-10 MED ORDER — SODIUM CHLORIDE 0.9% FLUSH
10.0000 mL | INTRAVENOUS | Status: DC | PRN
Start: 2020-09-10 — End: 2020-09-10
  Administered 2020-09-10: 10 mL
  Filled 2020-09-10: qty 10

## 2020-09-10 MED ORDER — SODIUM CHLORIDE 0.9 % IV SOLN
Freq: Once | INTRAVENOUS | Status: AC
Start: 2020-09-10 — End: 2020-09-10
  Filled 2020-09-10: qty 250

## 2020-09-10 MED ORDER — DIPHENHYDRAMINE HCL 50 MG/ML IJ SOLN
INTRAMUSCULAR | Status: AC
  Filled 2020-09-10: qty 1

## 2020-09-10 MED ORDER — DIPHENHYDRAMINE HCL 50 MG/ML IJ SOLN
25.0000 mg | Freq: Once | INTRAMUSCULAR | Status: AC
  Administered 2020-09-10: 25 mg via INTRAVENOUS

## 2020-09-10 MED ORDER — SODIUM CHLORIDE 0.9 % IV SOLN
80.0000 mg/m2 | Freq: Once | INTRAVENOUS | Status: AC
  Administered 2020-09-10: 162 mg via INTRAVENOUS
  Filled 2020-09-10: qty 27

## 2020-09-10 MED ORDER — FAMOTIDINE 20 MG IN NS 100 ML IVPB
20.0000 mg | Freq: Once | INTRAVENOUS | Status: AC
  Administered 2020-09-10: 20 mg via INTRAVENOUS

## 2020-09-10 MED ORDER — LORATADINE 10 MG PO TABS: 10.0000 mg | ORAL_TABLET | Freq: Every day | ORAL | 0 refills | Status: DC

## 2020-09-10 MED ORDER — SODIUM CHLORIDE 0.9% FLUSH
10.0000 mL | INTRAVENOUS | Status: DC | PRN
  Administered 2020-09-10: 10 mL
  Filled 2020-09-10: qty 10

## 2020-09-10 MED ORDER — SODIUM CHLORIDE 0.9 % IV SOLN
20.0000 mg | Freq: Once | INTRAVENOUS | Status: AC
  Administered 2020-09-10: 20 mg via INTRAVENOUS
  Filled 2020-09-10: qty 20

## 2020-09-10 NOTE — Progress Notes (Signed)
Wet to dry dressing applied to wound on patient's right breast per MD order. Patient instructed on how to apply wet to dry dressing at home, patient verbalized understanding and all questions were answered.

## 2020-09-10 NOTE — Patient Instructions (Signed)
[facility]  Discharge Instructions: Thank you for choosing [facility] to provide your oncology and hematology care.   If you have a lab appointment with the [facility], please go directly to the [facility] and check in at the registration area.   Wear comfortable clothing and clothing appropriate for easy access to any Portacath or PICC line.   We strive to give you quality time with your provider. You may need to reschedule your appointment if you arrive late (15 or more minutes).  Arriving late affects you and other patients whose appointments are after yours.  Also, if you miss three or more appointments without notifying the office, you may be dismissed from the clinic at the provider's discretion.      For prescription refill requests, have your pharmacy contact our office and allow 72 hours for refills to be completed.    Today you received the following chemotherapy and/or immunotherapy agents : Paclitaxel (Taxol)    To help prevent nausea and vomiting after your treatment, we encourage you to take your nausea medication as directed.  BELOW ARE SYMPTOMS THAT SHOULD BE REPORTED IMMEDIATELY: *FEVER GREATER THAN 100.4 F (38 C) OR HIGHER *CHILLS OR SWEATING *NAUSEA AND VOMITING THAT IS NOT CONTROLLED WITH YOUR NAUSEA MEDICATION *UNUSUAL SHORTNESS OF BREATH *UNUSUAL BRUISING OR BLEEDING *URINARY PROBLEMS (pain or burning when urinating, or frequent urination) *BOWEL PROBLEMS (unusual diarrhea, constipation, pain near the anus) TENDERNESS IN MOUTH AND THROAT WITH OR WITHOUT PRESENCE OF ULCERS (sore throat, sores in mouth, or a toothache) UNUSUAL RASH, SWELLING OR PAIN  UNUSUAL VAGINAL DISCHARGE OR ITCHING   Items with * indicate a potential emergency and should be followed up as soon as possible or go to the Emergency Department if any problems should occur.  Please show the CHEMOTHERAPY ALERT CARD or IMMUNOTHERAPY ALERT CARD at  check-in to the Emergency Department and triage nurse.  Should you have questions after your visit or need to cancel or reschedule your appointment, please contact [facility]  Dept: [phone]  and follow the prompts.  Office hours are 8:00 a.m. to 4:30 p.m. Monday - Friday. Please note that voicemails left after 4:00 p.m. may not be returned until the following business day.  We are closed weekends and major holidays. You have access to a nurse at all times for urgent questions. Please call the main number to the clinic Dept: [phone] and follow the prompts.   For any non-urgent questions, you may also contact your provider using MyChart. We now offer e-Visits for anyone 79 and older to request care online for non-urgent symptoms. For details visit [email or web address].   Also download the MyChart app! Go to the app store, search "MyChart", open the app, select [facility], and log in with your MyChart username and password.  Due to Covid, a mask is required upon entering the hospital/clinic. If you do not have a mask, one will be given to you upon arrival. For doctor visits, patients may have 1 support person aged 91 or older with them. For treatment visits, patients cannot have anyone with them due to current Covid guidelines and our immunocompromised population.   Paclitaxel injection What is this medication? PACLITAXEL (PAK li TAX el) is a chemotherapy drug. It targets fast dividing cells, like cancer cells, and causes these cells to die. This medicine is used to treat ovarian cancer, breast cancer, lung cancer, Kaposi's sarcoma, andother cancers. This medicine may be  used for other purposes; ask your health care provider orpharmacist if you have questions. COMMON BRAND NAME(S): Onxol, Taxol What should I tell my care team before I take this medication? They need to know if you have any of these conditions: history of irregular heartbeat liver  disease low blood counts, like low white cell, platelet, or red cell counts lung or breathing disease, like asthma tingling of the fingers or toes, or other nerve disorder an unusual or allergic reaction to paclitaxel, alcohol, polyoxyethylated castor oil, other chemotherapy, other medicines, foods, dyes, or preservatives pregnant or trying to get pregnant breast-feeding How should I use this medication? This drug is given as an infusion into a vein. It is administered in a hospitalor clinic by a specially trained health care professional. Talk to your pediatrician regarding the use of this medicine in children.Special care may be needed. Overdosage: If you think you have taken too much of this medicine contact apoison control center or emergency room at once. NOTE: This medicine is only for you. Do not share this medicine with others. What if I miss a dose? It is important not to miss your dose. Call your doctor or health careprofessional if you are unable to keep an appointment. What may interact with this medication? Do not take this medicine with any of the following medications: live virus vaccines This medicine may also interact with the following medications: antiviral medicines for hepatitis, HIV or AIDS certain antibiotics like erythromycin and clarithromycin certain medicines for fungal infections like ketoconazole and itraconazole certain medicines for seizures like carbamazepine, phenobarbital, phenytoin gemfibrozil nefazodone rifampin St. John's wort This list may not describe all possible interactions. Give your health care provider a list of all the medicines, herbs, non-prescription drugs, or dietary supplements you use. Also tell them if you smoke, drink alcohol, or use illegaldrugs. Some items may interact with your medicine. What should I watch for while using this medication? Your condition will be monitored carefully while you are receiving this medicine. You will  need important blood work done while you are taking thismedicine. This medicine can cause serious allergic reactions. To reduce your risk you will need to take other medicine(s) before treatment with this medicine. If you experience allergic reactions like skin rash, itching or hives, swelling of theface, lips, or tongue, tell your doctor or health care professional right away. In some cases, you may be given additional medicines to help with side effects.Follow all directions for their use. This drug may make you feel generally unwell. This is not uncommon, as chemotherapy can affect healthy cells as well as cancer cells. Report any side effects. Continue your course of treatment even though you feel ill unless yourdoctor tells you to stop. Call your doctor or health care professional for advice if you get a fever, chills or sore throat, or other symptoms of a cold or flu. Do not treat yourself. This drug decreases your body's ability to fight infections. Try toavoid being around people who are sick. This medicine may increase your risk to bruise or bleed. Call your doctor orhealth care professional if you notice any unusual bleeding. Be careful brushing and flossing your teeth or using a toothpick because you may get an infection or bleed more easily. If you have any dental work done,tell your dentist you are receiving this medicine. Avoid taking products that contain aspirin, acetaminophen, ibuprofen, naproxen, or ketoprofen unless instructed by your doctor. These medicines may hide afever. Do not become pregnant while taking this medicine.   Women should inform their doctor if they wish to become pregnant or think they might be pregnant. There is a potential for serious side effects to an unborn child. Talk to your health care professional or pharmacist for more information. Do not breast-feed aninfant while taking this medicine. Men are advised not to father a child while receiving this medicine. This  product may contain alcohol. Ask your pharmacist or healthcare provider if this medicine contains alcohol. Be sure to tell all healthcare providers you are taking this medicine. Certain medicines, like metronidazole and disulfiram, can cause an unpleasant reaction when taken with alcohol. The reaction includes flushing, headache, nausea, vomiting, sweating, and increased thirst. Thereaction can last from 30 minutes to several hours. What side effects may I notice from receiving this medication? Side effects that you should report to your doctor or health care professionalas soon as possible: allergic reactions like skin rash, itching or hives, swelling of the face, lips, or tongue breathing problems changes in vision fast, irregular heartbeat high or low blood pressure mouth sores pain, tingling, numbness in the hands or feet signs of decreased platelets or bleeding - bruising, pinpoint red spots on the skin, black, tarry stools, blood in the urine signs of decreased red blood cells - unusually weak or tired, feeling faint or lightheaded, falls signs of infection - fever or chills, cough, sore throat, pain or difficulty passing urine signs and symptoms of liver injury like dark yellow or brown urine; general ill feeling or flu-like symptoms; light-colored stools; loss of appetite; nausea; right upper belly pain; unusually weak or tired; yellowing of the eyes or skin swelling of the ankles, feet, hands unusually slow heartbeat Side effects that usually do not require medical attention (report to yourdoctor or health care professional if they continue or are bothersome): diarrhea hair loss loss of appetite muscle or joint pain nausea, vomiting pain, redness, or irritation at site where injected tiredness This list may not describe all possible side effects. Call your doctor for medical advice about side effects. You may report side effects to FDA at1-800-FDA-1088. Where should I keep my  medication? This drug is given in a hospital or clinic and will not be stored at home. NOTE: This sheet is a summary. It may not cover all possible information. If you have questions about this medicine, talk to your doctor, pharmacist, orhealth care provider.  2022 [employer] (2019-01-04 13:37:23)

## 2020-09-16 ENCOUNTER — Telehealth: Payer: Self-pay

## 2020-09-16 LAB — HM DIABETES EYE EXAM

## 2020-09-16 NOTE — Telephone Encounter (Signed)
Pt called and states she thinks her incision is infected and requests an appt. Pt also states her incision opened in July and surgeon is aware. I advised pt to cal [facility] to get an appt with Dr Barry Dienes or PA. Pt verbalized she would cal them.

## 2020-09-18 ENCOUNTER — Inpatient Hospital Stay: Payer: 59 | Attending: Oncology

## 2020-09-18 ENCOUNTER — Other Ambulatory Visit: Payer: Self-pay

## 2020-09-18 ENCOUNTER — Inpatient Hospital Stay: Payer: 59

## 2020-09-18 VITALS — BP 121/79 | HR 66 | Temp 97.8°F | Resp 18 | Ht 63.0 in | Wt 180.8 lb

## 2020-09-18 DIAGNOSIS — C50411 Malignant neoplasm of upper-outer quadrant of right female breast: Secondary | ICD-10-CM

## 2020-09-18 DIAGNOSIS — Z5111 Encounter for antineoplastic chemotherapy: Secondary | ICD-10-CM | POA: Diagnosis present

## 2020-09-18 DIAGNOSIS — Z95828 Presence of other vascular implants and grafts: Secondary | ICD-10-CM

## 2020-09-18 DIAGNOSIS — Z170 Estrogen receptor positive status [ER+]: Secondary | ICD-10-CM

## 2020-09-18 LAB — CBC WITH DIFFERENTIAL/PLATELET
Abs Immature Granulocytes: 0.01 10*3/uL (ref 0.00–0.07)
Basophils Absolute: 0 10*3/uL (ref 0.0–0.1)
Basophils Relative: 1 %
Eosinophils Absolute: 0.2 10*3/uL (ref 0.0–0.5)
Eosinophils Relative: 5 %
HCT: 28.6 % — ABNORMAL LOW (ref 36.0–46.0)
Hemoglobin: 9.5 g/dL — ABNORMAL LOW (ref 12.0–15.0)
Immature Granulocytes: 0 %
Lymphocytes Relative: 28 %
Lymphs Abs: 1 10*3/uL (ref 0.7–4.0)
MCH: 32.2 pg (ref 26.0–34.0)
MCHC: 33.2 g/dL (ref 30.0–36.0)
MCV: 96.9 fL (ref 80.0–100.0)
Monocytes Absolute: 0.4 10*3/uL (ref 0.1–1.0)
Monocytes Relative: 12 %
Neutro Abs: 1.9 10*3/uL (ref 1.7–7.7)
Neutrophils Relative %: 54 %
Platelets: 236 10*3/uL (ref 150–400)
RBC: 2.95 MIL/uL — ABNORMAL LOW (ref 3.87–5.11)
RDW: 19.1 % — ABNORMAL HIGH (ref 11.5–15.5)
WBC: 3.6 10*3/uL — ABNORMAL LOW (ref 4.0–10.5)
nRBC: 0 % (ref 0.0–0.2)

## 2020-09-18 LAB — COMPREHENSIVE METABOLIC PANEL
ALT: 25 U/L (ref 0–44)
AST: 22 U/L (ref 15–41)
Albumin: 3.3 g/dL — ABNORMAL LOW (ref 3.5–5.0)
Alkaline Phosphatase: 121 U/L (ref 38–126)
Anion gap: 8 (ref 5–15)
BUN: 12 mg/dL (ref 8–23)
CO2: 25 mmol/L (ref 22–32)
Calcium: 10.3 mg/dL (ref 8.9–10.3)
Chloride: 106 mmol/L (ref 98–111)
Creatinine, Ser: 0.75 mg/dL (ref 0.44–1.00)
GFR, Estimated: >60 mL/min (ref >60)
Glucose, Bld: 147 mg/dL — ABNORMAL HIGH (ref 70–99)
Potassium: 4.1 mmol/L (ref 3.5–5.1)
Sodium: 139 mmol/L (ref 135–145)
Total Bilirubin: <0.2 mg/dL — ABNORMAL LOW (ref 0.3–1.2)
Total Protein: 6.4 g/dL — ABNORMAL LOW (ref 6.5–8.1)

## 2020-09-18 MED ORDER — SODIUM CHLORIDE 0.9 % IV SOLN
20.0000 mg | Freq: Once | INTRAVENOUS | Status: AC
  Administered 2020-09-18: 20 mg via INTRAVENOUS
  Filled 2020-09-18: qty 20

## 2020-09-18 MED ORDER — SODIUM CHLORIDE 0.9 % IV SOLN
Freq: Once | INTRAVENOUS | Status: AC
  Filled 2020-09-18: qty 250

## 2020-09-18 MED ORDER — DIPHENHYDRAMINE HCL 50 MG/ML IJ SOLN
INTRAMUSCULAR | Status: AC
  Filled 2020-09-18: qty 1

## 2020-09-18 MED ORDER — SODIUM CHLORIDE 0.9% FLUSH
10.0000 mL | INTRAVENOUS | Status: DC | PRN
  Administered 2020-09-18: 10 mL
  Filled 2020-09-18: qty 10

## 2020-09-18 MED ORDER — DIPHENHYDRAMINE HCL 50 MG/ML IJ SOLN
25.0000 mg | Freq: Once | INTRAMUSCULAR | Status: AC
  Administered 2020-09-18: 25 mg via INTRAVENOUS

## 2020-09-18 MED ORDER — FAMOTIDINE 20 MG IN NS 100 ML IVPB
20.0000 mg | Freq: Once | INTRAVENOUS | Status: AC
  Administered 2020-09-18: 20 mg via INTRAVENOUS

## 2020-09-18 MED ORDER — HEPARIN SOD (PORK) LOCK FLUSH 100 UNIT/ML IV SOLN
500.0000 [IU] | Freq: Once | INTRAVENOUS | Status: AC | PRN
  Administered 2020-09-18: 500 [IU]
  Filled 2020-09-18: qty 5

## 2020-09-18 MED ORDER — FAMOTIDINE 20 MG IN NS 100 ML IVPB
INTRAVENOUS | Status: AC
  Filled 2020-09-18: qty 100

## 2020-09-18 MED ORDER — DEXAMETHASONE SODIUM PHOSPHATE 10 MG/ML IJ SOLN
INTRAMUSCULAR | Status: AC
  Filled 2020-09-18: qty 1

## 2020-09-18 MED ORDER — SODIUM CHLORIDE 0.9 % IV SOLN
80.0000 mg/m2 | Freq: Once | INTRAVENOUS | Status: AC
  Administered 2020-09-18: 162 mg via INTRAVENOUS
  Filled 2020-09-18: qty 27

## 2020-09-24 MED FILL — Dexamethasone Sodium Phosphate Inj 100 MG/10ML: INTRAMUSCULAR | Qty: 2 | Status: AC

## 2020-09-24 NOTE — Progress Notes (Signed)
[place]  Telephone:([phone]) [phone] Fax:(336) [phone]     ID: VALARIE FARACE DOB: 08/25/1960  MR#: [id number]  IOX#:[id number]  Patient Care Team: Mosie Lukes, MD as PCP - General (Family Medicine) Michel Bickers, MD as Consulting Physician (Infectious Diseases) Megan Salon, MD as Consulting Physician (Gynecology) Tajia Szeliga, Virgie Dad, MD as Consulting Physician (Oncology) Juanita Craver, MD as Consulting Physician (Gastroenterology) Stark Klein, MD as Consulting Physician (General Surgery) Mauro Kaufmann, RN as Oncology Nurse Navigator Rockwell Germany, RN as Oncology Nurse Navigator Aurea Graff OTHER MD:   CHIEF COMPLAINT: Estrogen receptor positive breast cancer  CURRENT TREATMENT: Adjuvant chemotherapy   INTERVAL HISTORY: Yolanda [name] returns today for follow up and treatment of her estrogen receptor positive breast cancer.    She completed 4 cycles of cyclophosphamide and doxorubicin and started weekly paclitaxel treatments on 08/26/2020. Today is week 5.  She is having no peripheral neuropathy symptoms.  She was closely questioned again today and that is very reassuring   REVIEW OF SYSTEMS: Harumi tells me the soles of her feet are peeling.  This has been going on for some time.  She has some finger pain, more like a cramp.  Also her nails are changing and look ugly to her.  Her right big toe hurts a little but again there is no peripheral neuropathy there.  She continues to exercise regularly, chiefly by walking.  A detailed review of systems today was otherwise stable   COVID 19 VACCINATION STATUS: fully vaccinated [employer]), with booster 12/2019   HISTORY OF CURRENT ILLNESS: From the original intake note:   I saw [name] in December 2018 for evaluation of atypical lobular hyperplasia.  We discussed various strategies regarding breast cancer prevention at that time for her to consider.    More recently (around the time of the Super Bowl  2022) she found a lump in her right breast.  She underwent bilateral diagnostic mammography with tomography and right breast ultrasonography at [facility] on 04/10/2020 showing: breast density category C; palpable 3 cm mass in right breast at 12 o'clock; no enlarged adenopathy in right axilla.  Accordingly on the same day, she proceeded to biopsy of the right breast area in question. The pathology from this procedure ([id number]) showed: invasive ductal carcinoma, grade 3; ductal carcinoma in situ. Prognostic indicators significant for: estrogen receptor, 90% positive with moderate staining intensity and progesterone receptor, 0% negative. Proliferation marker Ki67 at 25%. HER2 equivocal by immunohistochemistry (2+), but negative by fluorescent in situ hybridization with a signals ratio 1.49 and number per cell 2.75.  Cancer Staging Malignant neoplasm of upper-outer quadrant of right breast in female, estrogen receptor positive ([place]) Staging form: Breast, AJCC 8th Edition - Clinical: Stage IIA (cT2, cN0, cM0, G3, ER+, PR+, HER2-) - Signed by Chauncey Cruel, MD on 04/18/2020  The patient's subsequent history is as detailed below.   PAST MEDICAL HISTORY: Past Medical History:  Diagnosis Date   Asthma    with respiratory infection   Cancer (Yolanda [name])    breast cancer - right   Diabetes mellitus without complication ([place])    type II   Fatty liver    Fibroid uterus    Hypertension    Patient reports that she has never been diagnosied with HTN, "I take HCTZ for swelling in my feet, if needed."   Osteoarthritis 03/10/2015   PONV (postoperative nausea and vomiting)    2014   Refusal of blood transfusions as patient is  Jehovah's Witness    NO BLOOD PRODUCTS   Sleep apnea     PAST SURGICAL HISTORY: Past Surgical History:  Procedure Laterality Date   BREAST CYST ASPIRATION     BREAST EXCISIONAL BIOPSY     BREAST LUMPECTOMY WITH SENTINEL LYMPH NODE BIOPSY Right 05/21/2020   Procedure:  RIGHT BREAST LUMPECTOMY WITH SENTINEL LYMPH NODE BIOPSY;  Surgeon: Stark Klein, MD;  Location: [facility];  Service: General;  Laterality: Right;   CHOLECYSTECTOMY  1/11   COLONOSCOPY     DILATATION & CURRETTAGE/HYSTEROSCOPY WITH RESECTOCOPE N/A 03/13/2014   Procedure: DILATATION & CURETTAGE/HYSTEROSCOPY ;  Surgeon: Lyman Speller, MD;  Location: [facility];  Service: Gynecology;  Laterality: N/A;   PORTACATH PLACEMENT Left 05/21/2020   Procedure: INSERTION PORT-A-CATH;  Surgeon: Stark Klein, MD;  Location: [facility];  Service: General;  Laterality: Left;   RADIOACTIVE SEED GUIDED EXCISIONAL BREAST BIOPSY Left 12/30/2016   Procedure: RADIOACTIVE SEED GUIDED EXCISIONAL BREAST BIOPSY;  Surgeon: Stark Klein, MD;  Location: [facility];  Service: General;  Laterality: Left;    FAMILY HISTORY: Family History  Problem Relation Age of Onset   Asthma Mother    Diabetes Mother    Diabetes Father    Heart attack Father    Diabetes Brother    Diabetes Brother    Asthma Brother    Heart Problems Sister    Asthma Sister    Hypertension Sister    Heart disease Maternal Grandfather    Osteopenia Sister   The patient's father died from heart disease at the age of 5.  The patient's mother is 38 years old as of March 2022.  The patient has 4 brothers and 2 sisters.  She is not aware of any history of cancer in her family.   GYNECOLOGIC HISTORY:  Patient's last menstrual period was 02/21/2014. Menarche: 60 years old GX P 0 LMP age 47 Contraceptive 1 year, no complications HRT no  Hysterectomy? no BSO? 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  Her husband [name] works as a Marketing executive for [employer].  He has 2 children from a prior marriage, both living in [place].  One is traveling nurse.  The other 1 is a Administrator.  The patient is a Restaurant manager, fast food.    ADVANCED DIRECTIVES: In the absence of any documentation to the contrary, the patient's  spouse is their HCPOA.  The patient made it clear at her visit 04/18/2020 that she would not accept any blood transfusion or blood products for any reason including to stave off death   HEALTH MAINTENANCE: Social History   Tobacco Use   Smoking status: Never   Smokeless tobacco: Never  Vaping Use   Vaping Use: Never used  Substance Use Topics   Alcohol use: No   Drug use: No     Colonoscopy: 10/2019 (Dr. Collene Mares), recall 2026  PAP: 03/2017, negative  Bone density: 09/2018, -0.8   Allergies  Allergen Reactions   Lisinopril Shortness Of Breath and Cough    wheezing    Current Outpatient Medications  Medication Sig Dispense Refill   albuterol (PROVENTIL HFA;VENTOLIN HFA) 108 (90 Base) MCG/ACT inhaler Inhale 1-2 puffs into the lungs every 4 (four) hours as needed for wheezing or shortness of breath. 18 g 2   atorvastatin (LIPITOR) 10 MG tablet Take 10 mg by mouth every other day.     b complex vitamins capsule Take 1 capsule by mouth every other  day.     B-D ULTRA-FINE 33 LANCETS MISC Check BG 2 times/day     Carboxymethylcellul-Glycerin (REFRESH OPTIVE OP) Place 1 drop into both eyes 3 (three) times daily as needed (dry / irritated eyes).     cholecalciferol (VITAMIN D3) 25 MCG (1000 UNIT) tablet Take 1,000 Units by mouth every other day.     doxycycline (VIBRA-TABS) 100 MG tablet Take 100 mg by mouth 2 (two) times daily. (Patient not taking: Reported on 08/20/2020)     empagliflozin (JARDIANCE) 10 MG TABS tablet Take 1 tablet (10 mg total) by mouth daily before breakfast. 30 tablet 6   fluticasone (FLONASE) 50 MCG/ACT nasal spray Place 1 spray into both nostrils daily as needed for allergies or rhinitis.     glucose blood test strip Check BG 2 times/day.  Dx E11.65     lidocaine-prilocaine (EMLA) cream Apply to affected area once 30 g 3   loratadine (CLARITIN) 10 MG tablet Take 1 tablet (10 mg total) by mouth daily. 90 tablet 0   metFORMIN (GLUCOPHAGE-XR) 500 MG 24 hr tablet Take 2  tablets (1,000 mg total) by mouth 2 (two) times daily. 120 tablet 3   milk thistle 175 MG tablet Take 175 mg by mouth every other day.     Omega-3 Fatty Acids (OMEGA 3 PO) Take 1 capsule by mouth every other day.     omeprazole (PRILOSEC) 40 MG capsule Take 40 mg by mouth daily as needed (acid reflux).     prochlorperazine (COMPAZINE) 5 MG tablet Take 1 tablet (5 mg total) by mouth every 6 (six) hours as needed for nausea or vomiting. 30 tablet 0   valACYclovir (VALTREX) 500 MG tablet Take 1 tablet (500 mg total) by mouth daily. 60 tablet 1   vitamin C (ASCORBIC ACID) 500 MG tablet Take 500 mg by mouth every other day.     No current facility-administered medications for this visit.   Facility-Administered Medications Ordered in Other Visits  Medication Dose Route Frequency Provider Last Rate Last Admin   sodium chloride flush (NS) 0.9 % injection 10 mL  10 mL Intracatheter PRN Ryleigh Buenger, Virgie Dad, MD       SUPPLEMENTS: Aside from the medications listed above, the patient takes a teaspoon of olive oil daily, 1000 mg of vitamin C every other day, sunflower lecithin 600 mg every other day, vitamin D3 5000 mg every other day, magnesium 200 mg every other day, milk thistle 150 mg every other day, a B complex vitamin (B6 and B12) every other day, as well as Osteo Bi-Flex weekly and fluticasone as needed  OBJECTIVE: Patient was examined in the infusion area  For labs associated with the 09/25/2020 visit please see the infusion area flowsheet  There were no vitals filed for this visit.    There is no height or weight on file to calculate BMI.   Wt Readings from Last 3 Encounters:  09/18/20 180 lb 12 oz (82 kg)  09/10/20 180 lb (81.6 kg)  08/28/20 180 lb (81.6 kg)     ECOG FS:1 - Symptomatic but completely ambulatory  Sclerae unicteric, EOMs intact Wearing a mask No cervical or supraclavicular adenopathy Lungs no rales or rhonchi Heart regular rate and rhythm Abd soft, nontender Neuro:  nonfocal, well oriented, appropriate affect Breasts: Deferred    LAB RESULTS:  CMP     Component Value Date/Time   NA 139 09/18/2020 0853   NA 138 11/04/2017 0000   K 4.1 09/18/2020 0853   CL  106 09/18/2020 0853   CO2 25 09/18/2020 0853   GLUCOSE 147 (H) 09/18/2020 0853   BUN 12 09/18/2020 0853   BUN 14 11/04/2017 0000   CREATININE 0.75 09/18/2020 0853   CREATININE 0.81 04/18/2020 1527   CREATININE 0.60 09/07/2014 0950   CALCIUM 10.3 09/18/2020 0853   PROT 6.4 (L) 09/18/2020 0853   ALBUMIN 3.3 (L) 09/18/2020 0853   AST 22 09/18/2020 0853   AST 26 04/18/2020 1527   ALT 25 09/18/2020 0853   ALT 37 04/18/2020 1527   ALKPHOS 121 09/18/2020 0853   BILITOT <0.2 (L) 09/18/2020 0853   BILITOT 0.3 04/18/2020 1527   GFRNONAA >60 09/18/2020 0853   GFRNONAA >60 04/18/2020 1527   GFRAA >60 05/15/2019 1803    No results found for: TOTALPROTELP, ALBUMINELP, A1GS, A2GS, BETS, BETA2SER, GAMS, MSPIKE, SPEI  Lab Results  Component Value Date   WBC 3.6 (L) 09/18/2020   NEUTROABS 1.9 09/18/2020   HGB 9.5 (L) 09/18/2020   HCT 28.6 (L) 09/18/2020   MCV 96.9 09/18/2020   PLT 236 09/18/2020    No results found for: LABCA2  No components found for: [id number]  No results for input(s): INR in the last 168 hours.  No results found for: LABCA2  No results found for: LKG401  No results found for: UUV253  No results found for: GUY403  No results found for: CA2729  No components found for: HGQUANT  No results found for: CEA1 / No results found for: CEA1   No results found for: AFPTUMOR  No results found for: CHROMOGRNA  No results found for: KPAFRELGTCHN, LAMBDASER, KAPLAMBRATIO (kappa/lambda light chains)  No results found for: HGBA, HGBA2QUANT, HGBFQUANT, HGBSQUAN (Hemoglobinopathy evaluation)   No results found for: LDH  Lab Results  Component Value Date   IRON 38 (L) 10/16/2009   IRONPCTSAT 10.2 (L) 10/16/2009   (Iron and TIBC)  Lab Results  Component Value  Date   FERRITIN 8.9 (L) 10/16/2009    Urinalysis    Component Value Date/Time   COLORURINE YELLOW 03/11/2009 1100   APPEARANCEUR CLEAR 03/11/2009 1100   LABSPEC 1.017 03/11/2009 1100   PHURINE 7.0 03/11/2009 1100   GLUCOSEU NEGATIVE 03/11/2009 1100   HGBUR LARGE (A) 03/11/2009 1100   BILIRUBINUR N 05/21/2015 1614   KETONESUR NEGATIVE 03/11/2009 1100   PROTEINUR N 05/21/2015 1614   PROTEINUR NEGATIVE 03/11/2009 1100   UROBILINOGEN negative 05/21/2015 1614   UROBILINOGEN 0.2 03/11/2009 1100   NITRITE N 05/21/2015 1614   NITRITE NEGATIVE 03/11/2009 1100   LEUKOCYTESUR Negative 05/21/2015 1614    STUDIES: No results found.   ELIGIBLE FOR AVAILABLE RESEARCH PROTOCOL: no  ASSESSMENT: 60 y.o. [place] woman status post right breast upper outer quadrant biopsy 04/10/2020 for a clinical T2N0, stage IIA invasive ductal carcinoma, grade 3, estrogen receptor positive, progesterone receptor and HER-2 negative, with an MIB-1 of 25%.  (1) Oncotype obtained from the original biopsy shows a score of 48, predicting a recurrence rate outside the breast within 9 years of 28% if the only systemic therapy is antiestrogen for 5 years.  It also predicts a greater than 15% benefit from chemotherapy.  (2) right lumpectomy and sentinel lymph node dissection 05/21/2020 shows a pT2 pN0, stage IIB invasive ductal carcinoma, grade 3, with negative margins.  (3) adjuvant chemotherapy to consist of cyclophosphamide and doxorubicin in dose dense fashion x4 starting 06/18/2020, with Neulasta day 3; to be followed by paclitaxel weekly x12 started 08/28/2020.  (a) echo 05/13/2020 shows an ejection fraction in  the 55-60% range  (4) adjuvant radiation  (5) antiestrogens.   PLAN: Makalya continues to tolerate Taxol moderately well, with no evidence of peripheral neuropathy so far.  This is encouraging.  I reassured her that some of the skin peeling and cramping she is having is likely related to her current  treatment.  I commended her exercise program.  Her calcium has been on the high side fairly persistently.  I am going to add the parathyroid hormone level with the next set of labs.  Otherwise she will return for treatment next week and then for treatment and a visit 2 weeks from now  Total encounter time 25 minutes.Sarajane Jews C. Kelcie Currie, MD 09/24/2020 7:07 PM Medical Oncology and Hematology [facility] [place], [place] [address] Tel. [phone]    Fax. [phone]   This document serves as a record of services personally performed by Lurline Del, MD. It was created on his behalf by Wilburn Mylar, a trained medical scribe. The creation of this record is based on the scribe's personal observations and the provider's statements to them.   I, Lurline Del MD, have reviewed the above documentation for accuracy and completeness, and I agree with the above.   *Total Encounter Time as defined by the Centers for Medicare and Medicaid Services includes, in addition to the face-to-face time of a patient visit (documented in the note above) non-face-to-face time: obtaining and reviewing outside history, ordering and reviewing medications, tests or procedures, care coordination (communications with other health care professionals or caregivers) and documentation in the medical record.

## 2020-09-25 ENCOUNTER — Inpatient Hospital Stay: Payer: 59

## 2020-09-25 ENCOUNTER — Inpatient Hospital Stay (HOSPITAL_BASED_OUTPATIENT_CLINIC_OR_DEPARTMENT_OTHER): Payer: 59 | Admitting: Oncology

## 2020-09-25 ENCOUNTER — Other Ambulatory Visit: Payer: Self-pay | Admitting: Oncology

## 2020-09-25 ENCOUNTER — Other Ambulatory Visit: Payer: Self-pay

## 2020-09-25 VITALS — BP 139/84 | HR 87 | Temp 98.6°F | Resp 18 | Ht 63.0 in | Wt 179.7 lb

## 2020-09-25 DIAGNOSIS — Z170 Estrogen receptor positive status [ER+]: Secondary | ICD-10-CM

## 2020-09-25 DIAGNOSIS — Z95828 Presence of other vascular implants and grafts: Secondary | ICD-10-CM

## 2020-09-25 DIAGNOSIS — Z5111 Encounter for antineoplastic chemotherapy: Secondary | ICD-10-CM | POA: Diagnosis not present

## 2020-09-25 DIAGNOSIS — C50411 Malignant neoplasm of upper-outer quadrant of right female breast: Secondary | ICD-10-CM

## 2020-09-25 LAB — CBC WITH DIFFERENTIAL/PLATELET
Abs Immature Granulocytes: 0.02 10*3/uL (ref 0.00–0.07)
Basophils Absolute: 0 10*3/uL (ref 0.0–0.1)
Basophils Relative: 1 %
Eosinophils Absolute: 0.3 10*3/uL (ref 0.0–0.5)
Eosinophils Relative: 6 %
HCT: 31.3 % — ABNORMAL LOW (ref 36.0–46.0)
Hemoglobin: 10.3 g/dL — ABNORMAL LOW (ref 12.0–15.0)
Immature Granulocytes: 0 %
Lymphocytes Relative: 24 %
Lymphs Abs: 1.1 10*3/uL (ref 0.7–4.0)
MCH: 32.4 pg (ref 26.0–34.0)
MCHC: 32.9 g/dL (ref 30.0–36.0)
MCV: 98.4 fL (ref 80.0–100.0)
Monocytes Absolute: 0.6 10*3/uL (ref 0.1–1.0)
Monocytes Relative: 12 %
Neutro Abs: 2.6 10*3/uL (ref 1.7–7.7)
Neutrophils Relative %: 57 %
Platelets: 305 10*3/uL (ref 150–400)
RBC: 3.18 MIL/uL — ABNORMAL LOW (ref 3.87–5.11)
RDW: 18.6 % — ABNORMAL HIGH (ref 11.5–15.5)
WBC: 4.6 10*3/uL (ref 4.0–10.5)
nRBC: 0 % (ref 0.0–0.2)

## 2020-09-25 LAB — COMPREHENSIVE METABOLIC PANEL
ALT: 27 U/L (ref 0–44)
AST: 23 U/L (ref 15–41)
Albumin: 3.3 g/dL — ABNORMAL LOW (ref 3.5–5.0)
Alkaline Phosphatase: 136 U/L — ABNORMAL HIGH (ref 38–126)
Anion gap: 8 (ref 5–15)
BUN: 11 mg/dL (ref 8–23)
CO2: 25 mmol/L (ref 22–32)
Calcium: 10.4 mg/dL — ABNORMAL HIGH (ref 8.9–10.3)
Chloride: 104 mmol/L (ref 98–111)
Creatinine, Ser: 0.73 mg/dL (ref 0.44–1.00)
GFR, Estimated: >60 mL/min (ref >60)
Glucose, Bld: 192 mg/dL — ABNORMAL HIGH (ref 70–99)
Potassium: 4.2 mmol/L (ref 3.5–5.1)
Sodium: 137 mmol/L (ref 135–145)
Total Bilirubin: 0.3 mg/dL (ref 0.3–1.2)
Total Protein: 6.6 g/dL (ref 6.5–8.1)

## 2020-09-25 MED ORDER — SODIUM CHLORIDE 0.9 % IV SOLN
Freq: Once | INTRAVENOUS | Status: AC
  Filled 2020-09-25: qty 250

## 2020-09-25 MED ORDER — FAMOTIDINE 20 MG IN NS 100 ML IVPB
20.0000 mg | Freq: Once | INTRAVENOUS | Status: AC
  Administered 2020-09-25: 20 mg via INTRAVENOUS

## 2020-09-25 MED ORDER — FAMOTIDINE 20 MG IN NS 100 ML IVPB
INTRAVENOUS | Status: AC
  Filled 2020-09-25: qty 100

## 2020-09-25 MED ORDER — SODIUM CHLORIDE 0.9% FLUSH
10.0000 mL | INTRAVENOUS | Status: DC | PRN
  Administered 2020-09-25: 10 mL
  Filled 2020-09-25: qty 10

## 2020-09-25 MED ORDER — DIPHENHYDRAMINE HCL 50 MG/ML IJ SOLN
25.0000 mg | Freq: Once | INTRAMUSCULAR | Status: AC
  Administered 2020-09-25: 25 mg via INTRAVENOUS

## 2020-09-25 MED ORDER — DEXAMETHASONE SODIUM PHOSPHATE 10 MG/ML IJ SOLN
INTRAMUSCULAR | Status: AC
  Filled 2020-09-25: qty 1

## 2020-09-25 MED ORDER — DIPHENHYDRAMINE HCL 50 MG/ML IJ SOLN
INTRAMUSCULAR | Status: AC
  Filled 2020-09-25: qty 1

## 2020-09-25 MED ORDER — SODIUM CHLORIDE 0.9 % IV SOLN
4.0000 mg | Freq: Once | INTRAVENOUS | Status: DC
  Filled 2020-09-25: qty 0.4

## 2020-09-25 MED ORDER — SODIUM CHLORIDE 0.9% FLUSH
10.0000 mL | INTRAVENOUS | Status: DC | PRN
Start: 2020-09-25 — End: 2020-09-25
  Administered 2020-09-25: 10 mL
  Filled 2020-09-25: qty 10

## 2020-09-25 MED ORDER — DEXAMETHASONE SODIUM PHOSPHATE 10 MG/ML IJ SOLN
4.0000 mg | Freq: Once | INTRAMUSCULAR | Status: AC
  Administered 2020-09-25: 4 mg via INTRAVENOUS

## 2020-09-25 MED ORDER — SODIUM CHLORIDE 0.9 % IV SOLN
80.0000 mg/m2 | Freq: Once | INTRAVENOUS | Status: AC
  Administered 2020-09-25: 162 mg via INTRAVENOUS
  Filled 2020-09-25: qty 27

## 2020-09-25 MED ORDER — HEPARIN SOD (PORK) LOCK FLUSH 100 UNIT/ML IV SOLN
500.0000 [IU] | Freq: Once | INTRAVENOUS | Status: AC | PRN
  Administered 2020-09-25: 500 [IU]
  Filled 2020-09-25: qty 5

## 2020-09-25 NOTE — Patient Instructions (Signed)
[facility]ICAL [facility]COLOG[facility]  Discharge Instructions: Thank you for choosing [facility] to provide your oncology and hematology care.   If you have a lab appointment with the Cancer Center, please go directly to the Cancer Center and check in at the registration area.   Wear comfortable clothing and clothing appropriate for easy access to any Portacath or PICC line.   We strive to give you quality time with your provider. You may need to reschedule your appointment if you arrive late (15 or more minutes).  Arriving late affects you and other patients whose appointments are after yours.  Also, if you miss three or more appointments without notifying the office, you may be dismissed from the clinic at the provider's discretion.      For prescription refill requests, have your pharmacy contact our office and allow 72 hours for refills to be completed.    Today you received the following chemotherapy and/or immunotherapy agents   To help prevent nausea and vomiting after your treatment, we encourage you to take your nausea medication as directed.  BELOW ARE SYMPTOMS THAT SHOULD BE REPORTED IMMEDIATELY: *FEVER GREATER THAN 100.4 F (38 C) OR HIGHER *CHILLS OR SWEATING *NAUSEA AND VOMITING THAT IS NOT CONTROLLED WITH YOUR NAUSEA MEDICATION *UNUSUAL SHORTNESS OF BREATH *UNUSUAL BRUISING OR BLEEDING *URINARY PROBLEMS (pain or burning when urinating, or frequent urination) *BOWEL PROBLEMS (unusual diarrhea, constipation, pain near the anus) TENDERNESS IN MOUTH AND THROAT WITH OR WITHOUT PRESENCE OF ULCERS (sore throat, sores in mouth, or a toothache) UNUSUAL RASH, SWELLING OR PAIN  UNUSUAL VAGINAL DISCHARGE OR ITCHING   Items with * indicate a potential emergency and should be followed up as soon as possible or go to the Emergency Department if any problems should occur.  Please show the CHEMOTHERAPY ALERT CARD or IMMUNOTHERAPY ALERT CARD at check-in to the Emergency  Department and triage nurse.  Should you have questions after your visit or need to cancel or reschedule your appointment, please contact [facility]  Dept: [phone]  and follow the prompts.  Office hours are 8:00 a.m. to 4:30 p.m. Monday - Friday. Please note that voicemails left after 4:00 p.m. may not be returned until the following business day.  We are closed weekends and major holidays. You have access to a nurse at all times for urgent questions. Please call the main number to the clinic Dept: [phone] and follow the prompts.   For any non-urgent questions, you may also contact your provider using MyChart. We now offer e-Visits for anyone 18 and older to request care online for non-urgent symptoms. For details visit [email or web address].   Also download the MyChart app! Go to the app store, search "MyChart", open the app, select [facility], and log in with your MyChart username and password.  Due to Covid, a mask is required upon entering the hospital/clinic. If you do not have a mask, one will be given to you upon arrival. For doctor visits, patients may have 1 support person aged 18 or older with them. For treatment visits, patients cannot have anyone with them due to current Covid guidelines and our immunocompromised population.   

## 2020-09-27 ENCOUNTER — Ambulatory Visit: Payer: 59

## 2020-10-02 ENCOUNTER — Inpatient Hospital Stay: Payer: 59

## 2020-10-02 ENCOUNTER — Other Ambulatory Visit: Payer: Self-pay

## 2020-10-02 VITALS — BP 140/81 | HR 87 | Temp 98.2°F | Resp 18 | Ht 63.0 in | Wt 179.5 lb

## 2020-10-02 DIAGNOSIS — Z170 Estrogen receptor positive status [ER+]: Secondary | ICD-10-CM

## 2020-10-02 DIAGNOSIS — Z95828 Presence of other vascular implants and grafts: Secondary | ICD-10-CM

## 2020-10-02 DIAGNOSIS — Z5111 Encounter for antineoplastic chemotherapy: Secondary | ICD-10-CM | POA: Diagnosis not present

## 2020-10-02 DIAGNOSIS — C50411 Malignant neoplasm of upper-outer quadrant of right female breast: Secondary | ICD-10-CM

## 2020-10-02 LAB — CBC WITH DIFFERENTIAL/PLATELET
Abs Immature Granulocytes: 0.03 10*3/uL (ref 0.00–0.07)
Basophils Absolute: 0 10*3/uL (ref 0.0–0.1)
Basophils Relative: 1 %
Eosinophils Absolute: 0.2 10*3/uL (ref 0.0–0.5)
Eosinophils Relative: 4 %
HCT: 31.4 % — ABNORMAL LOW (ref 36.0–46.0)
Hemoglobin: 10.3 g/dL — ABNORMAL LOW (ref 12.0–15.0)
Immature Granulocytes: 1 %
Lymphocytes Relative: 23 %
Lymphs Abs: 1.2 10*3/uL (ref 0.7–4.0)
MCH: 32.4 pg (ref 26.0–34.0)
MCHC: 32.8 g/dL (ref 30.0–36.0)
MCV: 98.7 fL (ref 80.0–100.0)
Monocytes Absolute: 0.5 10*3/uL (ref 0.1–1.0)
Monocytes Relative: 10 %
Neutro Abs: 3.3 10*3/uL (ref 1.7–7.7)
Neutrophils Relative %: 61 %
Platelets: 301 10*3/uL (ref 150–400)
RBC: 3.18 MIL/uL — ABNORMAL LOW (ref 3.87–5.11)
RDW: 18.1 % — ABNORMAL HIGH (ref 11.5–15.5)
WBC: 5.4 10*3/uL (ref 4.0–10.5)
nRBC: 0 % (ref 0.0–0.2)

## 2020-10-02 LAB — COMPREHENSIVE METABOLIC PANEL
ALT: 27 U/L (ref 0–44)
AST: 23 U/L (ref 15–41)
Albumin: 3.4 g/dL — ABNORMAL LOW (ref 3.5–5.0)
Alkaline Phosphatase: 132 U/L — ABNORMAL HIGH (ref 38–126)
Anion gap: 9 (ref 5–15)
BUN: 14 mg/dL (ref 8–23)
CO2: 25 mmol/L (ref 22–32)
Calcium: 9.9 mg/dL (ref 8.9–10.3)
Chloride: 104 mmol/L (ref 98–111)
Creatinine, Ser: 0.8 mg/dL (ref 0.44–1.00)
GFR, Estimated: >60 mL/min (ref >60)
Glucose, Bld: 168 mg/dL — ABNORMAL HIGH (ref 70–99)
Potassium: 4.1 mmol/L (ref 3.5–5.1)
Sodium: 138 mmol/L (ref 135–145)
Total Bilirubin: 0.3 mg/dL (ref 0.3–1.2)
Total Protein: 6.7 g/dL (ref 6.5–8.1)

## 2020-10-02 MED ORDER — SODIUM CHLORIDE 0.9% FLUSH
10.0000 mL | INTRAVENOUS | Status: DC | PRN
  Administered 2020-10-02: 10 mL

## 2020-10-02 MED ORDER — FAMOTIDINE 20 MG IN NS 100 ML IVPB
20.0000 mg | Freq: Once | INTRAVENOUS | Status: AC
  Administered 2020-10-02: 20 mg via INTRAVENOUS
  Filled 2020-10-02: qty 100

## 2020-10-02 MED ORDER — COLD PACK MISC ONCOLOGY: 1.0000 | Freq: Once | Status: DC | PRN

## 2020-10-02 MED ORDER — SODIUM CHLORIDE 0.9 % IV SOLN: Freq: Once | INTRAVENOUS | Status: AC

## 2020-10-02 MED ORDER — SODIUM CHLORIDE 0.9 % IV SOLN
80.0000 mg/m2 | Freq: Once | INTRAVENOUS | Status: AC
  Administered 2020-10-02: 162 mg via INTRAVENOUS
  Filled 2020-10-02: qty 27

## 2020-10-02 MED ORDER — HEPARIN SOD (PORK) LOCK FLUSH 100 UNIT/ML IV SOLN
500.0000 [IU] | Freq: Once | INTRAVENOUS | Status: AC | PRN
  Administered 2020-10-02: 500 [IU]

## 2020-10-02 MED ORDER — DIPHENHYDRAMINE HCL 50 MG/ML IJ SOLN
25.0000 mg | Freq: Once | INTRAMUSCULAR | Status: AC
  Administered 2020-10-02: 25 mg via INTRAVENOUS
  Filled 2020-10-02: qty 1

## 2020-10-02 MED ORDER — DEXAMETHASONE SODIUM PHOSPHATE 10 MG/ML IJ SOLN
4.0000 mg | Freq: Once | INTRAMUSCULAR | Status: AC
  Administered 2020-10-02: 4 mg via INTRAVENOUS
  Filled 2020-10-02: qty 1

## 2020-10-02 NOTE — Patient Instructions (Signed)
[facility]  Discharge Instructions: Thank you for choosing [facility] to provide your oncology and hematology care.   If you have a lab appointment with the [facility], please go directly to the [facility] and check in at the registration area.   Wear comfortable clothing and clothing appropriate for easy access to any Portacath or PICC line.   We strive to give you quality time with your provider. You may need to reschedule your appointment if you arrive late (15 or more minutes).  Arriving late affects you and other patients whose appointments are after yours.  Also, if you miss three or more appointments without notifying the office, you may be dismissed from the clinic at the provider's discretion.      For prescription refill requests, have your pharmacy contact our office and allow 72 hours for refills to be completed.    Today you received the following chemotherapy and/or immunotherapy agents: Taxol      To help prevent nausea and vomiting after your treatment, we encourage you to take your nausea medication as directed.  BELOW ARE SYMPTOMS THAT SHOULD BE REPORTED IMMEDIATELY: *FEVER GREATER THAN 100.4 F (38 C) OR HIGHER *CHILLS OR SWEATING *NAUSEA AND VOMITING THAT IS NOT CONTROLLED WITH YOUR NAUSEA MEDICATION *UNUSUAL SHORTNESS OF BREATH *UNUSUAL BRUISING OR BLEEDING *URINARY PROBLEMS (pain or burning when urinating, or frequent urination) *BOWEL PROBLEMS (unusual diarrhea, constipation, pain near the anus) TENDERNESS IN MOUTH AND THROAT WITH OR WITHOUT PRESENCE OF ULCERS (sore throat, sores in mouth, or a toothache) UNUSUAL RASH, SWELLING OR PAIN  UNUSUAL VAGINAL DISCHARGE OR ITCHING   Items with * indicate a potential emergency and should be followed up as soon as possible or go to the Emergency Department if any problems should occur.  Please show the CHEMOTHERAPY ALERT CARD or IMMUNOTHERAPY ALERT CARD at check-in to the  Emergency Department and triage nurse.  Should you have questions after your visit or need to cancel or reschedule your appointment, please contact [facility]  Dept: [phone]  and follow the prompts.  Office hours are 8:00 a.m. to 4:30 p.m. Monday - Friday. Please note that voicemails left after 4:00 p.m. may not be returned until the following business day.  We are closed weekends and major holidays. You have access to a nurse at all times for urgent questions. Please call the main number to the clinic Dept: [phone] and follow the prompts.   For any non-urgent questions, you may also contact your provider using MyChart. We now offer e-Visits for anyone 61 and older to request care online for non-urgent symptoms. For details visit [email or web address].   Also download the MyChart app! Go to the app store, search "MyChart", open the app, select [facility], and log in with your MyChart username and password.  Due to Covid, a mask is required upon entering the hospital/clinic. If you do not have a mask, one will be given to you upon arrival. For doctor visits, patients may have 1 support person aged 61 or older with them. For treatment visits, patients cannot have anyone with them due to current Covid guidelines and our immunocompromised population.

## 2020-10-03 LAB — PTH, INTACT AND CALCIUM
Calcium, Total (PTH): 9.7 mg/dL (ref 8.7–10.3)
PTH: 38 pg/mL (ref 15–65)

## 2020-10-08 NOTE — Progress Notes (Signed)
[name]  Telephone:([phone]) [phone] Fax:([phone]) [phone]     ID: AUDREYANNA [name] DOB: [birth year]  MR#: [id number]  QIO#:[id number]  Patient Care Team: Mosie Lukes, MD as PCP - General (Family Medicine) Michel Bickers, MD as Consulting Physician (Infectious Diseases) Megan Salon, MD as Consulting Physician (Gynecology) Makeda Peeks, Virgie Dad, MD as Consulting Physician (Oncology) Juanita Craver, MD as Consulting Physician (Gastroenterology) Stark Klein, MD as Consulting Physician (General Surgery) Mauro Kaufmann, RN as Oncology Nurse Navigator Rockwell Germany, RN as Oncology Nurse Navigator Chauncey Cruel, MD OTHER MD:   CHIEF COMPLAINT: Estrogen receptor positive breast cancer  CURRENT TREATMENT: Adjuvant chemotherapy   INTERVAL HISTORY: Earlie returns today for follow up and treatment of her estrogen receptor positive breast cancer.    She continues with weekly paclitaxel treatments, started on 08/26/2020. Today is week 7.  REVIEW OF SYSTEMS: Ellizabeth has had mild epistaxis including a couple of occasions with dripping from the nose.  She does not blow her nose vigorously.  She had a little bit of a balance issue she thinks related to the nose problem and in her ear.  She felt like she was staggering, but she never did fall.  She has problems with insomnia for which she is taking melatonin.  She has no peripheral neuropathy symptoms.  A detailed review of systems today was otherwise stable   COVID 19 VACCINATION STATUS: fully vaccinated [employer]), with booster 12/2019   HISTORY OF CURRENT ILLNESS: From the original intake note:   I saw Yolanda [name] in December 2018 for evaluation of atypical lobular hyperplasia.  We discussed various strategies regarding breast cancer prevention at that time for her to consider.    More recently (around the time of the Super Bowl 2022) she found a lump in her right breast.  She underwent bilateral diagnostic mammography with  tomography and right breast ultrasonography at [facility] on 04/10/2020 showing: breast density category C; palpable 3 cm mass in right breast at 12 o'clock; no enlarged adenopathy in right axilla.  Accordingly on the same day, she proceeded to biopsy of the right breast area in question. The pathology from this procedure ([id number]) showed: invasive ductal carcinoma, grade 3; ductal carcinoma in situ. Prognostic indicators significant for: estrogen receptor, 90% positive with moderate staining intensity and progesterone receptor, 0% negative. Proliferation marker Ki67 at 25%. HER2 equivocal by immunohistochemistry (2+), but negative by fluorescent in situ hybridization with a signals ratio 1.49 and number per cell 2.75.  Cancer Staging Malignant neoplasm of upper-outer quadrant of right breast in female, estrogen receptor positive ([place]) Staging form: Breast, AJCC 8th Edition - Clinical: Stage IIA (cT2, cN0, cM0, G3, ER+, PR+, HER2-) - Signed by Chauncey Cruel, MD on 04/18/2020  The patient's subsequent history is as detailed below.   PAST MEDICAL HISTORY: Past Medical History:  Diagnosis Date   Asthma    with respiratory infection   Cancer ([place])    breast cancer - right   Diabetes mellitus without complication ([place])    type II   Fatty liver    Fibroid uterus    Hypertension    Patient reports that she has never been diagnosied with HTN, "I take HCTZ for swelling in my feet, if needed."   Osteoarthritis 03/10/2015   PONV (postoperative nausea and vomiting)    2014   Refusal of blood transfusions as patient is Jehovah's Witness    NO BLOOD PRODUCTS   Sleep apnea  PAST SURGICAL HISTORY: Past Surgical History:  Procedure Laterality Date   BREAST CYST ASPIRATION     BREAST EXCISIONAL BIOPSY     BREAST LUMPECTOMY WITH SENTINEL LYMPH NODE BIOPSY Right 05/21/2020   Procedure: RIGHT BREAST LUMPECTOMY WITH SENTINEL LYMPH NODE BIOPSY;  Surgeon: Stark Klein, MD;  Location: [facility];  Service: General;  Laterality: Right;   CHOLECYSTECTOMY  1/11   COLONOSCOPY     DILATATION & CURRETTAGE/HYSTEROSCOPY WITH RESECTOCOPE N/A 03/13/2014   Procedure: DILATATION & CURETTAGE/HYSTEROSCOPY ;  Surgeon: Lyman Speller, MD;  Location: [facility];  Service: Gynecology;  Laterality: N/A;   PORTACATH PLACEMENT Left 05/21/2020   Procedure: INSERTION PORT-A-CATH;  Surgeon: Stark Klein, MD;  Location: [facility];  Service: General;  Laterality: Left;   RADIOACTIVE SEED GUIDED EXCISIONAL BREAST BIOPSY Left 12/30/2016   Procedure: RADIOACTIVE SEED GUIDED EXCISIONAL BREAST BIOPSY;  Surgeon: Stark Klein, MD;  Location: [facility];  Service: General;  Laterality: Left;    FAMILY HISTORY: Family History  Problem Relation Age of Onset   Asthma Mother    Diabetes Mother    Diabetes Father    Heart attack Father    Diabetes Brother    Diabetes Brother    Asthma Brother    Heart Problems Sister    Asthma Sister    Hypertension Sister    Heart disease Maternal Grandfather    Osteopenia Sister   The patient's father died from heart disease at the age of 22.  The patient's mother is 27 years old as of March 2022.  The patient has 4 brothers and 2 sisters.  She is not aware of any history of cancer in her family.   GYNECOLOGIC HISTORY:  Patient's last menstrual period was 02/21/2014. Menarche: 60 years old GX P 0 LMP age 31 Contraceptive 1 year, no complications HRT no  Hysterectomy? no BSO? 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  Her husband Yolanda [name] works as a Marketing executive for [employer].  He has 2 children from a prior marriage, both living in [place].  One is traveling nurse.  The other 1 is a Administrator.  The patient is a Restaurant manager, fast food.    ADVANCED DIRECTIVES: In the absence of any documentation to the contrary, the patient's spouse is their HCPOA.  The patient made it clear at her visit 04/18/2020 that she would not accept  any blood transfusion or blood products for any reason including to stave off death   HEALTH MAINTENANCE: Social History   Tobacco Use   Smoking status: Never   Smokeless tobacco: Never  Vaping Use   Vaping Use: Never used  Substance Use Topics   Alcohol use: No   Drug use: No     Colonoscopy: 10/2019 (Dr. Collene Mares), recall 2026  PAP: 03/2017, negative  Bone density: 09/2018, -0.8   Allergies  Allergen Reactions   Lisinopril Shortness Of Breath and Cough    wheezing    Current Outpatient Medications  Medication Sig Dispense Refill   albuterol (PROVENTIL HFA;VENTOLIN HFA) 108 (90 Base) MCG/ACT inhaler Inhale 1-2 puffs into the lungs every 4 (four) hours as needed for wheezing or shortness of breath. 18 g 2   atorvastatin (LIPITOR) 10 MG tablet Take 10 mg by mouth every other day.     b complex vitamins capsule Take 1 capsule by mouth every other day.     B-D ULTRA-FINE 33 LANCETS MISC Check BG 2 times/day  Carboxymethylcellul-Glycerin (REFRESH OPTIVE OP) Place 1 drop into both eyes 3 (three) times daily as needed (dry / irritated eyes).     cholecalciferol (VITAMIN D3) 25 MCG (1000 UNIT) tablet Take 1,000 Units by mouth every other day.     doxycycline (VIBRA-TABS) 100 MG tablet Take 100 mg by mouth 2 (two) times daily. (Patient not taking: Reported on 08/20/2020)     empagliflozin (JARDIANCE) 10 MG TABS tablet Take 1 tablet (10 mg total) by mouth daily before breakfast. 30 tablet 6   fluticasone (FLONASE) 50 MCG/ACT nasal spray Place 1 spray into both nostrils daily as needed for allergies or rhinitis.     glucose blood test strip Check BG 2 times/day.  Dx E11.65     lidocaine-prilocaine (EMLA) cream Apply to affected area once 30 g 3   loratadine (CLARITIN) 10 MG tablet Take 1 tablet (10 mg total) by mouth daily. 90 tablet 0   metFORMIN (GLUCOPHAGE-XR) 500 MG 24 hr tablet Take 2 tablets (1,000 mg total) by mouth 2 (two) times daily. 120 tablet 3   milk thistle 175 MG tablet Take  175 mg by mouth every other day.     Omega-3 Fatty Acids (OMEGA 3 PO) Take 1 capsule by mouth every other day.     omeprazole (PRILOSEC) 40 MG capsule Take 40 mg by mouth daily as needed (acid reflux).     prochlorperazine (COMPAZINE) 5 MG tablet Take 1 tablet (5 mg total) by mouth every 6 (six) hours as needed for nausea or vomiting. 30 tablet 0   valACYclovir (VALTREX) 500 MG tablet Take 1 tablet (500 mg total) by mouth daily. 60 tablet 1   vitamin C (ASCORBIC ACID) 500 MG tablet Take 500 mg by mouth every other day.     No current facility-administered medications for this visit.   Facility-Administered Medications Ordered in Other Visits  Medication Dose Route Frequency Provider Last Rate Last Admin   sodium chloride flush (NS) 0.9 % injection 10 mL  10 mL Intracatheter PRN Salih Williamson, Virgie Dad, MD       SUPPLEMENTS: Aside from the medications listed above, the patient takes a teaspoon of olive oil daily, 1000 mg of vitamin C every other day, sunflower lecithin 600 mg every other day, vitamin D3 5000 mg every other day, magnesium 200 mg every other day, milk thistle 150 mg every other day, a B complex vitamin (B6 and B12) every other day, as well as Osteo Bi-Flex weekly and fluticasone as needed  OBJECTIVE: African-American woman in no acute distress  Vitals:   10/09/20 0831  BP: 134/72  Pulse: 83  Resp: 18  Temp: 97.9 F (36.6 C)  SpO2: 100%     Body mass index is 31.8 kg/m.   Wt Readings from Last 3 Encounters:  10/09/20 179 lb 8 oz (81.4 kg)  10/02/20 179 lb 8 oz (81.4 kg)  09/25/20 179 lb 11.2 oz (81.5 kg)     ECOG FS:1 - Symptomatic but completely ambulatory  Sclerae unicteric, EOMs intact Wearing a mask No cervical or supraclavicular adenopathy Lungs no rales or rhonchi Heart regular rate and rhythm Abd soft, nontender, positive bowel sounds MSK no focal spinal tenderness, no upper extremity lymphedema Neuro: nonfocal, well oriented, appropriate affect Breasts:  Deferred   LAB RESULTS:  CMP     Component Value Date/Time   NA 138 10/02/2020 1030   NA 138 11/04/2017 0000   K 4.1 10/02/2020 1030   CL 104 10/02/2020 1030   CO2 25  10/02/2020 1030   GLUCOSE 168 (H) 10/02/2020 1030   BUN 14 10/02/2020 1030   BUN 14 11/04/2017 0000   CREATININE 0.80 10/02/2020 1030   CREATININE 0.81 04/18/2020 1527   CREATININE 0.60 09/07/2014 0950   CALCIUM 9.7 10/02/2020 1030   CALCIUM 9.9 10/02/2020 1030   PROT 6.7 10/02/2020 1030   ALBUMIN 3.4 (L) 10/02/2020 1030   AST 23 10/02/2020 1030   AST 26 04/18/2020 1527   ALT 27 10/02/2020 1030   ALT 37 04/18/2020 1527   ALKPHOS 132 (H) 10/02/2020 1030   BILITOT 0.3 10/02/2020 1030   BILITOT 0.3 04/18/2020 1527   GFRNONAA >60 10/02/2020 1030   GFRNONAA >60 04/18/2020 1527   GFRAA >60 05/15/2019 1803    No results found for: TOTALPROTELP, ALBUMINELP, A1GS, A2GS, BETS, BETA2SER, GAMS, MSPIKE, SPEI  Lab Results  Component Value Date   WBC 4.3 10/09/2020   NEUTROABS 2.6 10/09/2020   HGB 10.1 (L) 10/09/2020   HCT 31.6 (L) 10/09/2020   MCV 99.7 10/09/2020   PLT 288 10/09/2020    No results found for: LABCA2  No components found for: PXTGGY694  No results for input(s): INR in the last 168 hours.  No results found for: LABCA2  No results found for: WNI627  No results found for: OJJ009  No results found for: FGH829  No results found for: CA2729  No components found for: HGQUANT  No results found for: CEA1 / No results found for: CEA1   No results found for: AFPTUMOR  No results found for: CHROMOGRNA  No results found for: KPAFRELGTCHN, LAMBDASER, KAPLAMBRATIO (kappa/lambda light chains)  No results found for: HGBA, HGBA2QUANT, HGBFQUANT, HGBSQUAN (Hemoglobinopathy evaluation)   No results found for: LDH  Lab Results  Component Value Date   IRON 38 (L) 10/16/2009   IRONPCTSAT 10.2 (L) 10/16/2009   (Iron and TIBC)  Lab Results  Component Value Date   FERRITIN 8.9 (L)  10/16/2009    Urinalysis    Component Value Date/Time   COLORURINE YELLOW 03/11/2009 1100   APPEARANCEUR CLEAR 03/11/2009 1100   LABSPEC 1.017 03/11/2009 1100   PHURINE 7.0 03/11/2009 1100   GLUCOSEU NEGATIVE 03/11/2009 1100   HGBUR LARGE (A) 03/11/2009 1100   BILIRUBINUR N 05/21/2015 1614   KETONESUR NEGATIVE 03/11/2009 1100   PROTEINUR N 05/21/2015 1614   PROTEINUR NEGATIVE 03/11/2009 1100   UROBILINOGEN negative 05/21/2015 1614   UROBILINOGEN 0.2 03/11/2009 1100   NITRITE N 05/21/2015 1614   NITRITE NEGATIVE 03/11/2009 1100   LEUKOCYTESUR Negative 05/21/2015 1614    STUDIES: No results found.   ELIGIBLE FOR AVAILABLE RESEARCH PROTOCOL: no  ASSESSMENT: 60 y.o. [place] woman status post right breast upper outer quadrant biopsy 04/10/2020 for a clinical T2N0, stage IIA invasive ductal carcinoma, grade 3, estrogen receptor positive, progesterone receptor and HER-2 negative, with an MIB-1 of 25%.  (1) Oncotype obtained from the original biopsy shows a score of 48, predicting a recurrence rate outside the breast within 9 years of 28% if the only systemic therapy is antiestrogen for 5 years.  It also predicts a greater than 15% benefit from chemotherapy.  (2) right lumpectomy and sentinel lymph node dissection 05/21/2020 shows a pT2 pN0, stage IIB invasive ductal carcinoma, grade 3, with negative margins.  (3) adjuvant chemotherapy to consist of cyclophosphamide and doxorubicin in dose dense fashion x4 starting 06/18/2020, with Neulasta day 3; to be followed by paclitaxel weekly x12 started 08/28/2020.  (a) echo 05/13/2020 shows an ejection fraction in the 55-60% range  (  4) adjuvant radiation  (5) antiestrogens.   PLAN: Donna is generally doing quite well with her Taxol treatments and we are proceeding with the seventh of 12 planned doses today.  The nosebleed is a side effect of the Taxol, fairly common, generally benign, and my suggestion is that she not blow her nose  vigorously and consider Otrivin if the bleeding seems to get a little bit worse.  For her insomnia I suggested she try Benadryl at bedtime.  She also has nocturia and we discussed how she can try to minimize that by taking fewer liquids after 8 PM  Otherwise she will continue her weekly Taxol treatments and see me again in 2 weeks.  Total encounter time 20 minutes.Sarajane Jews C. Chiara Coltrin, MD 10/09/2020 8:50 AM Medical Oncology and Hematology [facility] [place], [place] [address] Tel. [phone]    Fax. [phone]   This document serves as a record of services personally performed by Lurline Del, MD. It was created on his behalf by Wilburn Mylar, a trained medical scribe. The creation of this record is based on the scribe's personal observations and the provider's statements to them.   I, Lurline Del MD, have reviewed the above documentation for accuracy and completeness, and I agree with the above.   *Total Encounter Time as defined by the Centers for Medicare and Medicaid Services includes, in addition to the face-to-face time of a patient visit (documented in the note above) non-face-to-face time: obtaining and reviewing outside history, ordering and reviewing medications, tests or procedures, care coordination (communications with other health care professionals or caregivers) and documentation in the medical record.

## 2020-10-09 ENCOUNTER — Inpatient Hospital Stay (HOSPITAL_BASED_OUTPATIENT_CLINIC_OR_DEPARTMENT_OTHER): Payer: 59 | Admitting: Oncology

## 2020-10-09 ENCOUNTER — Other Ambulatory Visit: Payer: Self-pay

## 2020-10-09 ENCOUNTER — Inpatient Hospital Stay: Payer: 59

## 2020-10-09 ENCOUNTER — Encounter: Payer: Self-pay | Admitting: *Deleted

## 2020-10-09 VITALS — BP 134/72 | HR 83 | Temp 97.9°F | Resp 18 | Ht 63.0 in | Wt 179.5 lb

## 2020-10-09 DIAGNOSIS — Z170 Estrogen receptor positive status [ER+]: Secondary | ICD-10-CM | POA: Diagnosis not present

## 2020-10-09 DIAGNOSIS — C50411 Malignant neoplasm of upper-outer quadrant of right female breast: Secondary | ICD-10-CM

## 2020-10-09 DIAGNOSIS — Z95828 Presence of other vascular implants and grafts: Secondary | ICD-10-CM

## 2020-10-09 DIAGNOSIS — Z5111 Encounter for antineoplastic chemotherapy: Secondary | ICD-10-CM | POA: Diagnosis not present

## 2020-10-09 LAB — COMPREHENSIVE METABOLIC PANEL
ALT: 24 U/L (ref 0–44)
AST: 26 U/L (ref 15–41)
Albumin: 3.4 g/dL — ABNORMAL LOW (ref 3.5–5.0)
Alkaline Phosphatase: 120 U/L (ref 38–126)
Anion gap: 8 (ref 5–15)
BUN: 13 mg/dL (ref 8–23)
CO2: 25 mmol/L (ref 22–32)
Calcium: 10.1 mg/dL (ref 8.9–10.3)
Chloride: 105 mmol/L (ref 98–111)
Creatinine, Ser: 0.82 mg/dL (ref 0.44–1.00)
GFR, Estimated: >60 mL/min (ref >60)
Glucose, Bld: 205 mg/dL — ABNORMAL HIGH (ref 70–99)
Potassium: 3.9 mmol/L (ref 3.5–5.1)
Sodium: 138 mmol/L (ref 135–145)
Total Bilirubin: 0.3 mg/dL (ref 0.3–1.2)
Total Protein: 6.6 g/dL (ref 6.5–8.1)

## 2020-10-09 LAB — CBC WITH DIFFERENTIAL/PLATELET
Abs Immature Granulocytes: 0.02 10*3/uL (ref 0.00–0.07)
Basophils Absolute: 0 10*3/uL (ref 0.0–0.1)
Basophils Relative: 1 %
Eosinophils Absolute: 0.2 10*3/uL (ref 0.0–0.5)
Eosinophils Relative: 4 %
HCT: 31.6 % — ABNORMAL LOW (ref 36.0–46.0)
Hemoglobin: 10.1 g/dL — ABNORMAL LOW (ref 12.0–15.0)
Immature Granulocytes: 1 %
Lymphocytes Relative: 22 %
Lymphs Abs: 1 10*3/uL (ref 0.7–4.0)
MCH: 31.9 pg (ref 26.0–34.0)
MCHC: 32 g/dL (ref 30.0–36.0)
MCV: 99.7 fL (ref 80.0–100.0)
Monocytes Absolute: 0.5 10*3/uL (ref 0.1–1.0)
Monocytes Relative: 11 %
Neutro Abs: 2.6 10*3/uL (ref 1.7–7.7)
Neutrophils Relative %: 61 %
Platelets: 288 10*3/uL (ref 150–400)
RBC: 3.17 MIL/uL — ABNORMAL LOW (ref 3.87–5.11)
RDW: 17.7 % — ABNORMAL HIGH (ref 11.5–15.5)
WBC: 4.3 10*3/uL (ref 4.0–10.5)
nRBC: 0 % (ref 0.0–0.2)

## 2020-10-09 MED ORDER — SODIUM CHLORIDE 0.9% FLUSH
10.0000 mL | INTRAVENOUS | Status: DC | PRN
  Administered 2020-10-09: 10 mL

## 2020-10-09 MED ORDER — DEXAMETHASONE SODIUM PHOSPHATE 10 MG/ML IJ SOLN
4.0000 mg | Freq: Once | INTRAMUSCULAR | Status: AC
  Administered 2020-10-09: 4 mg via INTRAVENOUS
  Filled 2020-10-09: qty 1

## 2020-10-09 MED ORDER — SODIUM CHLORIDE 0.9 % IV SOLN: Freq: Once | INTRAVENOUS | Status: AC

## 2020-10-09 MED ORDER — FAMOTIDINE 20 MG IN NS 100 ML IVPB
20.0000 mg | Freq: Once | INTRAVENOUS | Status: AC
  Administered 2020-10-09: 20 mg via INTRAVENOUS
  Filled 2020-10-09: qty 100

## 2020-10-09 MED ORDER — DIPHENHYDRAMINE HCL 50 MG/ML IJ SOLN
25.0000 mg | Freq: Once | INTRAMUSCULAR | Status: AC
  Administered 2020-10-09: 25 mg via INTRAVENOUS
  Filled 2020-10-09: qty 1

## 2020-10-09 MED ORDER — HEPARIN SOD (PORK) LOCK FLUSH 100 UNIT/ML IV SOLN
500.0000 [IU] | Freq: Once | INTRAVENOUS | Status: AC | PRN
  Administered 2020-10-09: 500 [IU]

## 2020-10-09 MED ORDER — SODIUM CHLORIDE 0.9 % IV SOLN
80.0000 mg/m2 | Freq: Once | INTRAVENOUS | Status: AC
  Administered 2020-10-09: 162 mg via INTRAVENOUS
  Filled 2020-10-09: qty 27

## 2020-10-09 NOTE — Patient Instructions (Signed)
[facility]  Discharge Instructions: Thank you for choosing [facility] to provide your oncology and hematology care.   If you have a lab appointment with the [facility], please go directly to the [facility] and check in at the registration area.   Wear comfortable clothing and clothing appropriate for easy access to any Portacath or PICC line.   We strive to give you quality time with your provider. You may need to reschedule your appointment if you arrive late (15 or more minutes).  Arriving late affects you and other patients whose appointments are after yours.  Also, if you miss three or more appointments without notifying the office, you may be dismissed from the clinic at the provider's discretion.      For prescription refill requests, have your pharmacy contact our office and allow 72 hours for refills to be completed.    Today you received the following chemotherapy and/or immunotherapy agents Taxol      To help prevent nausea and vomiting after your treatment, we encourage you to take your nausea medication as directed.  BELOW ARE SYMPTOMS THAT SHOULD BE REPORTED IMMEDIATELY: *FEVER GREATER THAN 100.4 F (38 C) OR HIGHER *CHILLS OR SWEATING *NAUSEA AND VOMITING THAT IS NOT CONTROLLED WITH YOUR NAUSEA MEDICATION *UNUSUAL SHORTNESS OF BREATH *UNUSUAL BRUISING OR BLEEDING *URINARY PROBLEMS (pain or burning when urinating, or frequent urination) *BOWEL PROBLEMS (unusual diarrhea, constipation, pain near the anus) TENDERNESS IN MOUTH AND THROAT WITH OR WITHOUT PRESENCE OF ULCERS (sore throat, sores in mouth, or a toothache) UNUSUAL RASH, SWELLING OR PAIN  UNUSUAL VAGINAL DISCHARGE OR ITCHING   Items with * indicate a potential emergency and should be followed up as soon as possible or go to the Emergency Department if any problems should occur.  Please show the CHEMOTHERAPY ALERT CARD or IMMUNOTHERAPY ALERT CARD at check-in to the  Emergency Department and triage nurse.  Should you have questions after your visit or need to cancel or reschedule your appointment, please contact [facility]  Dept: [phone]  and follow the prompts.  Office hours are 8:00 a.m. to 4:30 p.m. Monday - Friday. Please note that voicemails left after 4:00 p.m. may not be returned until the following business day.  We are closed weekends and major holidays. You have access to a nurse at all times for urgent questions. Please call the main number to the clinic Dept: [phone] and follow the prompts.   For any non-urgent questions, you may also contact your provider using MyChart. We now offer e-Visits for anyone 47 and older to request care online for non-urgent symptoms. For details visit [email or web address].   Also download the MyChart app! Go to the app store, search "MyChart", open the app, select [facility], and log in with your MyChart username and password.  Due to Covid, a mask is required upon entering the hospital/clinic. If you do not have a mask, one will be given to you upon arrival. For doctor visits, patients may have 1 support person aged 91 or older with them. For treatment visits, patients cannot have anyone with them due to current Covid guidelines and our immunocompromised population.

## 2020-10-15 ENCOUNTER — Encounter: Payer: Self-pay | Admitting: *Deleted

## 2020-10-16 ENCOUNTER — Other Ambulatory Visit: Payer: Self-pay

## 2020-10-16 ENCOUNTER — Inpatient Hospital Stay: Payer: 59

## 2020-10-16 VITALS — BP 131/80 | HR 96 | Temp 99.1°F | Resp 18 | Wt 179.1 lb

## 2020-10-16 DIAGNOSIS — Z170 Estrogen receptor positive status [ER+]: Secondary | ICD-10-CM

## 2020-10-16 DIAGNOSIS — C50411 Malignant neoplasm of upper-outer quadrant of right female breast: Secondary | ICD-10-CM

## 2020-10-16 DIAGNOSIS — Z5111 Encounter for antineoplastic chemotherapy: Secondary | ICD-10-CM | POA: Diagnosis not present

## 2020-10-16 DIAGNOSIS — Z95828 Presence of other vascular implants and grafts: Secondary | ICD-10-CM

## 2020-10-16 LAB — COMPREHENSIVE METABOLIC PANEL
ALT: 25 U/L (ref 0–44)
AST: 24 U/L (ref 15–41)
Albumin: 3.5 g/dL (ref 3.5–5.0)
Alkaline Phosphatase: 124 U/L (ref 38–126)
Anion gap: 9 (ref 5–15)
BUN: 11 mg/dL (ref 8–23)
CO2: 24 mmol/L (ref 22–32)
Calcium: 10 mg/dL (ref 8.9–10.3)
Chloride: 105 mmol/L (ref 98–111)
Creatinine, Ser: 0.72 mg/dL (ref 0.44–1.00)
GFR, Estimated: >60 mL/min (ref >60)
Glucose, Bld: 212 mg/dL — ABNORMAL HIGH (ref 70–99)
Potassium: 4.1 mmol/L (ref 3.5–5.1)
Sodium: 138 mmol/L (ref 135–145)
Total Bilirubin: 0.2 mg/dL — ABNORMAL LOW (ref 0.3–1.2)
Total Protein: 6.6 g/dL (ref 6.5–8.1)

## 2020-10-16 LAB — CBC WITH DIFFERENTIAL/PLATELET
Abs Immature Granulocytes: 0.05 10*3/uL (ref 0.00–0.07)
Basophils Absolute: 0 10*3/uL (ref 0.0–0.1)
Basophils Relative: 1 %
Eosinophils Absolute: 0.2 10*3/uL (ref 0.0–0.5)
Eosinophils Relative: 4 %
HCT: 31.5 % — ABNORMAL LOW (ref 36.0–46.0)
Hemoglobin: 10.1 g/dL — ABNORMAL LOW (ref 12.0–15.0)
Immature Granulocytes: 1 %
Lymphocytes Relative: 22 %
Lymphs Abs: 1 10*3/uL (ref 0.7–4.0)
MCH: 32.2 pg (ref 26.0–34.0)
MCHC: 32.1 g/dL (ref 30.0–36.0)
MCV: 100.3 fL — ABNORMAL HIGH (ref 80.0–100.0)
Monocytes Absolute: 0.5 10*3/uL (ref 0.1–1.0)
Monocytes Relative: 10 %
Neutro Abs: 3 10*3/uL (ref 1.7–7.7)
Neutrophils Relative %: 62 %
Platelets: 308 10*3/uL (ref 150–400)
RBC: 3.14 MIL/uL — ABNORMAL LOW (ref 3.87–5.11)
RDW: 17.2 % — ABNORMAL HIGH (ref 11.5–15.5)
WBC: 4.7 10*3/uL (ref 4.0–10.5)
nRBC: 0 % (ref 0.0–0.2)

## 2020-10-16 MED ORDER — FAMOTIDINE 20 MG IN NS 100 ML IVPB
INTRAVENOUS | Status: AC
  Filled 2020-10-16: qty 100

## 2020-10-16 MED ORDER — HEPARIN SOD (PORK) LOCK FLUSH 100 UNIT/ML IV SOLN
500.0000 [IU] | Freq: Once | INTRAVENOUS | Status: AC | PRN
  Administered 2020-10-16: 500 [IU]

## 2020-10-16 MED ORDER — DEXAMETHASONE SODIUM PHOSPHATE 10 MG/ML IJ SOLN
INTRAMUSCULAR | Status: AC
  Filled 2020-10-16: qty 1

## 2020-10-16 MED ORDER — SODIUM CHLORIDE 0.9 % IV SOLN: Freq: Once | INTRAVENOUS | Status: AC

## 2020-10-16 MED ORDER — SODIUM CHLORIDE 0.9% FLUSH
10.0000 mL | INTRAVENOUS | Status: DC | PRN
  Administered 2020-10-16: 10 mL

## 2020-10-16 MED ORDER — DIPHENHYDRAMINE HCL 50 MG/ML IJ SOLN
INTRAMUSCULAR | Status: AC
  Filled 2020-10-16: qty 1

## 2020-10-16 MED ORDER — DEXAMETHASONE SODIUM PHOSPHATE 10 MG/ML IJ SOLN
4.0000 mg | Freq: Once | INTRAMUSCULAR | Status: AC
  Administered 2020-10-16: 4 mg via INTRAVENOUS

## 2020-10-16 MED ORDER — SODIUM CHLORIDE 0.9 % IV SOLN
80.0000 mg/m2 | Freq: Once | INTRAVENOUS | Status: AC
  Administered 2020-10-16: 162 mg via INTRAVENOUS
  Filled 2020-10-16: qty 27

## 2020-10-16 MED ORDER — FAMOTIDINE 20 MG IN NS 100 ML IVPB
20.0000 mg | Freq: Once | INTRAVENOUS | Status: AC
  Administered 2020-10-16: 20 mg via INTRAVENOUS

## 2020-10-16 MED ORDER — DIPHENHYDRAMINE HCL 50 MG/ML IJ SOLN
25.0000 mg | Freq: Once | INTRAMUSCULAR | Status: AC
  Administered 2020-10-16: 25 mg via INTRAVENOUS

## 2020-10-16 NOTE — Patient Instructions (Signed)
[facility]  Discharge Instructions: Thank you for choosing [facility] to provide your oncology and hematology care.   If you have a lab appointment with the [facility], please go directly to the [facility] and check in at the registration area.   Wear comfortable clothing and clothing appropriate for easy access to any Portacath or PICC line.   We strive to give you quality time with your provider. You may need to reschedule your appointment if you arrive late (15 or more minutes).  Arriving late affects you and other patients whose appointments are after yours.  Also, if you miss three or more appointments without notifying the office, you may be dismissed from the clinic at the provider's discretion.      For prescription refill requests, have your pharmacy contact our office and allow 72 hours for refills to be completed.    Today you received the following chemotherapy and/or immunotherapy agents Paclitaxel (Taxol)      To help prevent nausea and vomiting after your treatment, we encourage you to take your nausea medication as directed.  BELOW ARE SYMPTOMS THAT SHOULD BE REPORTED IMMEDIATELY: *FEVER GREATER THAN 100.4 F (38 C) OR HIGHER *CHILLS OR SWEATING *NAUSEA AND VOMITING THAT IS NOT CONTROLLED WITH YOUR NAUSEA MEDICATION *UNUSUAL SHORTNESS OF BREATH *UNUSUAL BRUISING OR BLEEDING *URINARY PROBLEMS (pain or burning when urinating, or frequent urination) *BOWEL PROBLEMS (unusual diarrhea, constipation, pain near the anus) TENDERNESS IN MOUTH AND THROAT WITH OR WITHOUT PRESENCE OF ULCERS (sore throat, sores in mouth, or a toothache) UNUSUAL RASH, SWELLING OR PAIN  UNUSUAL VAGINAL DISCHARGE OR ITCHING   Items with * indicate a potential emergency and should be followed up as soon as possible or go to the Emergency Department if any problems should occur.  Please show the CHEMOTHERAPY ALERT CARD or IMMUNOTHERAPY ALERT CARD at  check-in to the Emergency Department and triage nurse.  Should you have questions after your visit or need to cancel or reschedule your appointment, please contact [facility]  Dept: [phone]  and follow the prompts.  Office hours are 8:00 a.m. to 4:30 p.m. Monday - Friday. Please note that voicemails left after 4:00 p.m. may not be returned until the following business day.  We are closed weekends and major holidays. You have access to a nurse at all times for urgent questions. Please call the main number to the clinic Dept: [phone] and follow the prompts.   For any non-urgent questions, you may also contact your provider using MyChart. We now offer e-Visits for anyone 72 and older to request care online for non-urgent symptoms. For details visit [email or web address].   Also download the MyChart app! Go to the app store, search "MyChart", open the app, select [facility], and log in with your MyChart username and password.  Due to Covid, a mask is required upon entering the hospital/clinic. If you do not have a mask, one will be given to you upon arrival. For doctor visits, patients may have 1 support person aged 51 or older with them. For treatment visits, patients cannot have anyone with them due to current Covid guidelines and our immunocompromised population.

## 2020-10-17 ENCOUNTER — Ambulatory Visit (INDEPENDENT_AMBULATORY_CARE_PROVIDER_SITE_OTHER): Payer: 59 | Admitting: Family Medicine

## 2020-10-17 ENCOUNTER — Encounter: Payer: Self-pay | Admitting: Family Medicine

## 2020-10-17 VITALS — BP 122/68 | HR 85 | Temp 98.6°F | Resp 16 | Wt 183.4 lb

## 2020-10-17 DIAGNOSIS — E119 Type 2 diabetes mellitus without complications: Secondary | ICD-10-CM

## 2020-10-17 DIAGNOSIS — R748 Abnormal levels of other serum enzymes: Secondary | ICD-10-CM

## 2020-10-17 DIAGNOSIS — Z170 Estrogen receptor positive status [ER+]: Secondary | ICD-10-CM

## 2020-10-17 DIAGNOSIS — C50411 Malignant neoplasm of upper-outer quadrant of right female breast: Secondary | ICD-10-CM

## 2020-10-17 DIAGNOSIS — I10 Essential (primary) hypertension: Secondary | ICD-10-CM | POA: Diagnosis not present

## 2020-10-17 DIAGNOSIS — E785 Hyperlipidemia, unspecified: Secondary | ICD-10-CM | POA: Diagnosis not present

## 2020-10-17 LAB — LIPID PANEL
Cholesterol: 185 mg/dL (ref 0–200)
HDL: 53.8 mg/dL (ref >39.00)
NonHDL: 131.2
Total CHOL/HDL Ratio: 3
Triglycerides: 222 mg/dL — ABNORMAL HIGH (ref 0.0–149.0)
VLDL: 44.4 mg/dL — ABNORMAL HIGH (ref 0.0–40.0)

## 2020-10-17 LAB — LDL CHOLESTEROL, DIRECT: Direct LDL: 101 mg/dL

## 2020-10-17 LAB — TSH: TSH: 1.58 u[IU]/mL (ref 0.35–5.50)

## 2020-10-17 NOTE — Assessment & Plan Note (Signed)
She is doing well is tolerating Chemo, then will move onto Radiation. She follows with Dr Jana Hakim and Dr Barry Dienes

## 2020-10-17 NOTE — Assessment & Plan Note (Signed)
Following with endocrinology and has struggled with some numbers above 200. She is eating well and exercising regularly

## 2020-10-17 NOTE — Assessment & Plan Note (Signed)
Continue to monitor asymptomatic 

## 2020-10-17 NOTE — Progress Notes (Signed)
Patient ID: Yolanda [name], female    DOB: [birth year]  Age: 60 y.o. MRN: [id number]    Subjective:   Chief Complaint  Patient presents with   Follow-up   Subjective   HPI Yolanda [name] presents for office visit today for follow up on HTN and diabetes. She reports that she is doing well and is handling chemotherapy gracefully besides the fatigue and opening of incision. Denies CP/palp/SOB/HA/congestion/fevers/GI or GU c/o. Taking meds as prescribed. She reports that recently her sugar readings were not good averaging around 200. She has trouble tolerating gains like wheat or bread and experiences nausea. She has visit planned with Dr. Sharen Counter OD in [place].   Review of Systems  Constitutional:  Positive for fatigue. Negative for chills and fever.  HENT:  Negative for congestion, rhinorrhea, sinus pressure, sinus pain, sore throat and trouble swallowing.   Eyes:  Negative for pain.  Respiratory:  Negative for cough and shortness of breath.   Cardiovascular:  Negative for chest pain, palpitations and leg swelling.  Gastrointestinal:  Positive for nausea. Negative for abdominal pain, blood in stool, diarrhea and vomiting.  Genitourinary:  Negative for decreased urine volume, flank pain, frequency, vaginal bleeding and vaginal discharge.  Musculoskeletal:  Negative for back pain.  Neurological:  Negative for headaches.   History Past Medical History:  Diagnosis Date   Asthma    with respiratory infection   Cancer ([place])    breast cancer - right   Diabetes mellitus without complication ([place])    type II   Fatty liver    Fibroid uterus    Hypertension    Patient reports that she has never been diagnosied with HTN, "I take HCTZ for swelling in my feet, if needed."   Osteoarthritis 03/10/2015   PONV (postoperative nausea and vomiting)    2014   Refusal of blood transfusions as patient is Jehovah's Witness    NO BLOOD PRODUCTS   Sleep apnea     She has a past surgical history  that includes Cholecystectomy (1/11); Colonoscopy; Dilatation & currettage/hysteroscopy with resectoscope (N/A, 03/13/2014); Breast cyst aspiration; Radioactive seed guided excisional breast biopsy (Left, 12/30/2016); Breast excisional biopsy; Breast lumpectomy with sentinel lymph node bx (Right, 05/21/2020); and Portacath placement (Left, 05/21/2020).   Her family history includes Asthma in her brother, mother, and sister; Diabetes in her brother, brother, father, and mother; Heart Problems in her sister; Heart attack in her father; Heart disease in her maternal grandfather; Hypertension in her sister; Osteopenia in her sister.She reports that she has never smoked. She has never used smokeless tobacco. She reports that she does not drink alcohol and does not use drugs.  Current Outpatient Medications on File Prior to Visit  Medication Sig Dispense Refill   albuterol (PROVENTIL HFA;VENTOLIN HFA) 108 (90 Base) MCG/ACT inhaler Inhale 1-2 puffs into the lungs every 4 (four) hours as needed for wheezing or shortness of breath. 18 g 2   atorvastatin (LIPITOR) 10 MG tablet Take 10 mg by mouth every other day.     b complex vitamins capsule Take 1 capsule by mouth every other day.     B-D ULTRA-FINE 33 LANCETS MISC Check BG 2 times/day     Carboxymethylcellul-Glycerin (REFRESH OPTIVE OP) Place 1 drop into both eyes 3 (three) times daily as needed (dry / irritated eyes).     cholecalciferol (VITAMIN D3) 25 MCG (1000 UNIT) tablet Take 1,000 Units by mouth every other day.     empagliflozin (JARDIANCE) 10 MG TABS tablet  Take 1 tablet (10 mg total) by mouth daily before breakfast. 30 tablet 6   fluticasone (FLONASE) 50 MCG/ACT nasal spray Place 1 spray into both nostrils daily as needed for allergies or rhinitis.     glucose blood test strip Check BG 2 times/day.  Dx E11.65     lidocaine-prilocaine (EMLA) cream Apply to affected area once 30 g 3   loratadine (CLARITIN) 10 MG tablet Take 1 tablet (10 mg total) by  mouth daily. 90 tablet 0   milk thistle 175 MG tablet Take 175 mg by mouth every other day.     Omega-3 Fatty Acids (OMEGA 3 PO) Take 1 capsule by mouth every other day.     omeprazole (PRILOSEC) 40 MG capsule Take 40 mg by mouth daily as needed (acid reflux).     prochlorperazine (COMPAZINE) 5 MG tablet Take 1 tablet (5 mg total) by mouth every 6 (six) hours as needed for nausea or vomiting. 30 tablet 0   valACYclovir (VALTREX) 500 MG tablet Take 1 tablet (500 mg total) by mouth daily. 60 tablet 1   vitamin C (ASCORBIC ACID) 500 MG tablet Take 500 mg by mouth every other day.     doxycycline (VIBRA-TABS) 100 MG tablet Take 100 mg by mouth 2 (two) times daily. (Patient not taking: No sig reported)     Current Facility-Administered Medications on File Prior to Visit  Medication Dose Route Frequency Provider Last Rate Last Admin   sodium chloride flush (NS) 0.9 % injection 10 mL  10 mL Intracatheter PRN Magrinat, Virgie Dad, MD         Objective:  Objective  Physical Exam Constitutional:      General: She is not in acute distress.    Appearance: Normal appearance. She is not ill-appearing or toxic-appearing.  HENT:     Head: Normocephalic and atraumatic.     Right Ear: Tympanic membrane, ear canal and external ear normal.     Left Ear: Tympanic membrane, ear canal and external ear normal.     Nose: No congestion or rhinorrhea.  Eyes:     Extraocular Movements: Extraocular movements intact.     Pupils: Pupils are equal, round, and reactive to light.  Cardiovascular:     Rate and Rhythm: Normal rate and regular rhythm.     Pulses: Normal pulses.     Heart sounds: Normal heart sounds. No murmur heard. Pulmonary:     Effort: Pulmonary effort is normal. No respiratory distress.     Breath sounds: Normal breath sounds. No wheezing, rhonchi or rales.  Abdominal:     General: Bowel sounds are normal.     Palpations: Abdomen is soft. There is no mass.     Tenderness: There is no abdominal  tenderness. There is no guarding.     Hernia: No hernia is present.  Musculoskeletal:        General: Normal range of motion.     Cervical back: Normal range of motion and neck supple.  Skin:    General: Skin is warm and dry.  Neurological:     Mental Status: She is alert and oriented to person, place, and time.  Psychiatric:        Behavior: Behavior normal.   BP 122/68   Pulse 85   Temp 98.6 F (37 C)   Resp 16   Wt 183 lb 6.4 oz (83.2 kg)   LMP 02/21/2014   SpO2 95%   BMI 32.49 kg/m  Wt Readings from Last  3 Encounters:  10/17/20 183 lb 6.4 oz (83.2 kg)  10/16/20 179 lb 1.9 oz (81.2 kg)  10/09/20 179 lb 8 oz (81.4 kg)     Lab Results  Component Value Date   WBC 4.7 10/16/2020   HGB 10.1 (L) 10/16/2020   HCT 31.5 (L) 10/16/2020   PLT 308 10/16/2020   GLUCOSE 212 (H) 10/16/2020   CHOL 152 04/09/2020   TRIG 158.0 (H) 04/09/2020   HDL 61.50 04/09/2020   LDLDIRECT 128.9 07/14/2006   LDLCALC 59 04/09/2020   ALT 25 10/16/2020   AST 24 10/16/2020   NA 138 10/16/2020   K 4.1 10/16/2020   CL 105 10/16/2020   CREATININE 0.72 10/16/2020   BUN 11 10/16/2020   CO2 24 10/16/2020   TSH 3.34 04/09/2020   HGBA1C 7.4 (A) 08/20/2020   MICROALBUR 26 11/04/2017    NM Sentinel Node Inj-No Rpt (Breast)  Result Date: 05/21/2020 Sulfur colloid was injected by the nuclear medicine technologist for melanoma sentinel node.   DG CHEST PORT 1 VIEW  Result Date: 05/21/2020 CLINICAL DATA:  Port-A-Cath placement EXAM: PORTABLE CHEST 1 VIEW COMPARISON:  May 15, 2019 FINDINGS: Port-A-Cath tip is at the cavoatrial junction. No pneumothorax. There is no edema or airspace opacity. Heart is mildly enlarged with pulmonary vascularity normal. No adenopathy. Surgical clips overlie each breast region. IMPRESSION: Port-A-Cath tip at cavoatrial junction. No pneumothorax. No edema or airspace opacity. Stable cardiac prominence. Electronically Signed   By: Lowella Grip III M.D.   On: 05/21/2020  12:53   DG Fluoro Guide CV Line-No Report  Result Date: 05/21/2020 Fluoroscopy was utilized by the requesting physician.  No radiographic interpretation.     Assessment & Plan:  Plan    No orders of the defined types were placed in this encounter.   Problem List Items Addressed This Visit     Essential hypertension - Primary    Well controlled, no changes to meds. Encouraged heart healthy diet such as the DASH diet and exercise as tolerated.       Relevant Orders   TSH   Diabetes ([place])    Following with endocrinology and has struggled with some numbers above 200. She is eating well and exercising regularly      Relevant Orders   Microalbumin / creatinine urine ratio   Abnormal liver enzymes    Continue to monitor. asymptomatic      Hyperlipidemia    Tolerating statin, encouraged heart healthy diet, avoid trans fats, minimize simple carbs and saturated fats. Increase exercise as tolerated      Relevant Orders   Lipid panel   Malignant neoplasm of upper-outer quadrant of right breast in female, estrogen receptor positive ([place])    She is doing well is tolerating Chemo, then will move onto Radiation. She follows with Dr Jana Hakim and Dr Barry Dienes       Follow-up: Return in about 6 months (around 04/16/2021) for annual exam.  I, Suezanne Jacquet, acting as a scribe for Penni Homans, MD, have documented all relevent documentation on behalf of Penni Homans, MD, as directed by Penni Homans, MD while in the presence of Penni Homans, MD. DO:10/17/20.  I, Mosie Lukes, MD personally performed the services described in this documentation. All medical record entries made by the scribe were at my direction and in my presence. I have reviewed the chart and agree that the record reflects my personal performance and is accurate and complete

## 2020-10-17 NOTE — Assessment & Plan Note (Signed)
Well controlled, no changes to meds. Encouraged heart healthy diet such as the DASH diet and exercise as tolerated.  °

## 2020-10-17 NOTE — Assessment & Plan Note (Signed)
Tolerating statin, encouraged heart healthy diet, avoid trans fats, minimize simple carbs and saturated fats. Increase exercise as tolerated 

## 2020-10-17 NOTE — Patient Instructions (Addendum)
  Paxlovid is the new COVID medication we can give you if you get COVID so make sure you test if you have symptoms because we have to treat by day 5 of symptoms for it to be effective. If you are positive let us know so we can treat. If a home test is negative and your symptoms are persistent get a PCR test. Can check testing locations at [email or web address] If you are positive we will make an appointment with [place] and we will send in Paxlovid if you would like it. Check with your pharmacy before we meet to confirm they have it in stock, if they do not then we can get the prescription at the [facility]   Shingrix is the new shingles shot, 2 shots over 2-6 months, confirm coverage with insurance and document, then can return here for shots with nurse appt or at pharmacy   Flu shot  Space each shot by roughly 2 weeks

## 2020-10-18 ENCOUNTER — Other Ambulatory Visit: Payer: 59

## 2020-10-18 ENCOUNTER — Other Ambulatory Visit: Payer: Self-pay

## 2020-10-18 LAB — MICROALBUMIN / CREATININE URINE RATIO
Creatinine,U: 25.3 mg/dL
Microalb Creat Ratio: 2.8 mg/g (ref 0.0–30.0)
Microalb, Ur: <0.7 mg/dL (ref 0.0–1.9)

## 2020-10-23 ENCOUNTER — Inpatient Hospital Stay (HOSPITAL_BASED_OUTPATIENT_CLINIC_OR_DEPARTMENT_OTHER): Payer: 59 | Admitting: Adult Health

## 2020-10-23 ENCOUNTER — Other Ambulatory Visit: Payer: Self-pay

## 2020-10-23 ENCOUNTER — Inpatient Hospital Stay: Payer: 59

## 2020-10-23 ENCOUNTER — Inpatient Hospital Stay: Payer: 59 | Attending: Oncology

## 2020-10-23 VITALS — BP 146/90 | HR 88 | Temp 99.0°F | Resp 18 | Ht 63.0 in | Wt 184.4 lb

## 2020-10-23 DIAGNOSIS — R5383 Other fatigue: Secondary | ICD-10-CM | POA: Diagnosis not present

## 2020-10-23 DIAGNOSIS — Z8269 Family history of other diseases of the musculoskeletal system and connective tissue: Secondary | ICD-10-CM | POA: Insufficient documentation

## 2020-10-23 DIAGNOSIS — E119 Type 2 diabetes mellitus without complications: Secondary | ICD-10-CM | POA: Insufficient documentation

## 2020-10-23 DIAGNOSIS — Z170 Estrogen receptor positive status [ER+]: Secondary | ICD-10-CM | POA: Insufficient documentation

## 2020-10-23 DIAGNOSIS — Z79899 Other long term (current) drug therapy: Secondary | ICD-10-CM | POA: Diagnosis not present

## 2020-10-23 DIAGNOSIS — Z888 Allergy status to other drugs, medicaments and biological substances status: Secondary | ICD-10-CM | POA: Diagnosis not present

## 2020-10-23 DIAGNOSIS — C50411 Malignant neoplasm of upper-outer quadrant of right female breast: Secondary | ICD-10-CM

## 2020-10-23 DIAGNOSIS — Z836 Family history of other diseases of the respiratory system: Secondary | ICD-10-CM | POA: Insufficient documentation

## 2020-10-23 DIAGNOSIS — Z9049 Acquired absence of other specified parts of digestive tract: Secondary | ICD-10-CM | POA: Diagnosis not present

## 2020-10-23 DIAGNOSIS — Z5111 Encounter for antineoplastic chemotherapy: Secondary | ICD-10-CM | POA: Insufficient documentation

## 2020-10-23 DIAGNOSIS — I10 Essential (primary) hypertension: Secondary | ICD-10-CM | POA: Diagnosis not present

## 2020-10-23 DIAGNOSIS — Z95828 Presence of other vascular implants and grafts: Secondary | ICD-10-CM

## 2020-10-23 DIAGNOSIS — Z8249 Family history of ischemic heart disease and other diseases of the circulatory system: Secondary | ICD-10-CM | POA: Insufficient documentation

## 2020-10-23 DIAGNOSIS — Z833 Family history of diabetes mellitus: Secondary | ICD-10-CM | POA: Insufficient documentation

## 2020-10-23 LAB — CBC WITH DIFFERENTIAL/PLATELET
Abs Immature Granulocytes: 0.03 10*3/uL (ref 0.00–0.07)
Basophils Absolute: 0 10*3/uL (ref 0.0–0.1)
Basophils Relative: 1 %
Eosinophils Absolute: 0.2 10*3/uL (ref 0.0–0.5)
Eosinophils Relative: 4 %
HCT: 31.4 % — ABNORMAL LOW (ref 36.0–46.0)
Hemoglobin: 9.9 g/dL — ABNORMAL LOW (ref 12.0–15.0)
Immature Granulocytes: 1 %
Lymphocytes Relative: 24 %
Lymphs Abs: 1.4 10*3/uL (ref 0.7–4.0)
MCH: 32 pg (ref 26.0–34.0)
MCHC: 31.5 g/dL (ref 30.0–36.0)
MCV: 101.6 fL — ABNORMAL HIGH (ref 80.0–100.0)
Monocytes Absolute: 0.6 10*3/uL (ref 0.1–1.0)
Monocytes Relative: 11 %
Neutro Abs: 3.4 10*3/uL (ref 1.7–7.7)
Neutrophils Relative %: 59 %
Platelets: 314 10*3/uL (ref 150–400)
RBC: 3.09 MIL/uL — ABNORMAL LOW (ref 3.87–5.11)
RDW: 16.4 % — ABNORMAL HIGH (ref 11.5–15.5)
WBC: 5.7 10*3/uL (ref 4.0–10.5)
nRBC: 0 % (ref 0.0–0.2)

## 2020-10-23 LAB — COMPREHENSIVE METABOLIC PANEL
ALT: 20 U/L (ref 0–44)
AST: 22 U/L (ref 15–41)
Albumin: 3.5 g/dL (ref 3.5–5.0)
Alkaline Phosphatase: 137 U/L — ABNORMAL HIGH (ref 38–126)
Anion gap: 10 (ref 5–15)
BUN: 11 mg/dL (ref 8–23)
CO2: 23 mmol/L (ref 22–32)
Calcium: 10 mg/dL (ref 8.9–10.3)
Chloride: 104 mmol/L (ref 98–111)
Creatinine, Ser: 0.7 mg/dL (ref 0.44–1.00)
GFR, Estimated: >60 mL/min (ref >60)
Glucose, Bld: 126 mg/dL — ABNORMAL HIGH (ref 70–99)
Potassium: 4.3 mmol/L (ref 3.5–5.1)
Sodium: 137 mmol/L (ref 135–145)
Total Bilirubin: 0.2 mg/dL — ABNORMAL LOW (ref 0.3–1.2)
Total Protein: 6.6 g/dL (ref 6.5–8.1)

## 2020-10-23 MED ORDER — SODIUM CHLORIDE 0.9 % IV SOLN: Freq: Once | INTRAVENOUS | Status: AC

## 2020-10-23 MED ORDER — FAMOTIDINE 20 MG IN NS 100 ML IVPB
20.0000 mg | Freq: Once | INTRAVENOUS | Status: AC
  Administered 2020-10-23: 20 mg via INTRAVENOUS
  Filled 2020-10-23: qty 100

## 2020-10-23 MED ORDER — HEPARIN SOD (PORK) LOCK FLUSH 100 UNIT/ML IV SOLN
500.0000 [IU] | Freq: Once | INTRAVENOUS | Status: AC | PRN
  Administered 2020-10-23: 500 [IU]

## 2020-10-23 MED ORDER — SODIUM CHLORIDE 0.9% FLUSH
10.0000 mL | INTRAVENOUS | Status: DC | PRN
  Administered 2020-10-23: 10 mL

## 2020-10-23 MED ORDER — DEXAMETHASONE SODIUM PHOSPHATE 10 MG/ML IJ SOLN
4.0000 mg | Freq: Once | INTRAMUSCULAR | Status: AC
  Administered 2020-10-23: 4 mg via INTRAVENOUS
  Filled 2020-10-23: qty 1

## 2020-10-23 MED ORDER — DIPHENHYDRAMINE HCL 50 MG/ML IJ SOLN
25.0000 mg | Freq: Once | INTRAMUSCULAR | Status: AC
  Administered 2020-10-23: 25 mg via INTRAVENOUS
  Filled 2020-10-23: qty 1

## 2020-10-23 MED ORDER — SODIUM CHLORIDE 0.9 % IV SOLN
80.0000 mg/m2 | Freq: Once | INTRAVENOUS | Status: AC
  Administered 2020-10-23: 162 mg via INTRAVENOUS
  Filled 2020-10-23: qty 27

## 2020-10-23 NOTE — Progress Notes (Signed)
Per Wilber Bihari, ok to treat.

## 2020-10-23 NOTE — Patient Instructions (Signed)
[facility]   Discharge Instructions: Thank you for choosing [facility] to provide your oncology and hematology care.   If you have a lab appointment with the Cancer Center, please go directly to the Cancer Center and check in at the registration area.   Wear comfortable clothing and clothing appropriate for easy access to any Portacath or PICC line.   We strive to give you quality time with your provider. You may need to reschedule your appointment if you arrive late (15 or more minutes).  Arriving late affects you and other patients whose appointments are after yours.  Also, if you miss three or more appointments without notifying the office, you may be dismissed from the clinic at the provider's discretion.      For prescription refill requests, have your pharmacy contact our office and allow 72 hours for refills to be completed.    Today you received the following chemotherapy and/or immunotherapy agents: paclitaxel.      To help prevent nausea and vomiting after your treatment, we encourage you to take your nausea medication as directed.  BELOW ARE SYMPTOMS THAT SHOULD BE REPORTED IMMEDIATELY: *FEVER GREATER THAN 100.4 F (38 C) OR HIGHER *CHILLS OR SWEATING *NAUSEA AND VOMITING THAT IS NOT CONTROLLED WITH YOUR NAUSEA MEDICATION *UNUSUAL SHORTNESS OF BREATH *UNUSUAL BRUISING OR BLEEDING *URINARY PROBLEMS (pain or burning when urinating, or frequent urination) *BOWEL PROBLEMS (unusual diarrhea, constipation, pain near the anus) TENDERNESS IN MOUTH AND THROAT WITH OR WITHOUT PRESENCE OF ULCERS (sore throat, sores in mouth, or a toothache) UNUSUAL RASH, SWELLING OR PAIN  UNUSUAL VAGINAL DISCHARGE OR ITCHING   Items with * indicate a potential emergency and should be followed up as soon as possible or go to the Emergency Department if any problems should occur.  Please show the CHEMOTHERAPY ALERT CARD or IMMUNOTHERAPY ALERT CARD at check-in  to the Emergency Department and triage nurse.  Should you have questions after your visit or need to cancel or reschedule your appointment, please contact [facility]  Dept: [phone]  and follow the prompts.  Office hours are 8:00 a.m. to 4:30 p.m. Monday - Friday. Please note that voicemails left after 4:00 p.m. may not be returned until the following business day.  We are closed weekends and major holidays. You have access to a nurse at all times for urgent questions. Please call the main number to the clinic Dept: [phone] and follow the prompts.   For any non-urgent questions, you may also contact your provider using MyChart. We now offer e-Visits for anyone 18 and older to request care online for non-urgent symptoms. For details visit [email or web address].   Also download the MyChart app! Go to the app store, search "MyChart", open the app, select , and log in with your MyChart username and password.  Due to Covid, a mask is required upon entering the hospital/clinic. If you do not have a mask, one will be given to you upon arrival. For doctor visits, patients may have 1 support person aged 18 or older with them. For treatment visits, patients cannot have anyone with them due to current Covid guidelines and our immunocompromised population.   

## 2020-10-23 NOTE — Progress Notes (Signed)
[facility]  [phone] [phone]     ID: AJIA CHADDERDON DOB: 1960-11-09  MR#: [id number]  BWO#:[id number]  Patient Care Team: Bradd Canary, MD as PCP - General (Family Medicine) Cliffton Asters, MD as Consulting Physician (Infectious Diseases) Jerene Bears, MD as Consulting Physician (Gynecology) Magrinat, Valentino Hue, MD as Consulting Physician (Oncology) Charna Elizabeth, MD as Consulting Physician (Gastroenterology) Almond Lint, MD as Consulting Physician (General Surgery) Pershing Proud, RN as Oncology Nurse Navigator Donnelly Angelica, RN as Oncology Nurse Navigator Hurshel Party, OD (Optometry) Noreene Filbert, NP OTHER MD:   CHIEF COMPLAINT: Estrogen receptor positive breast cancer  CURRENT TREATMENT: Adjuvant chemotherapy   INTERVAL HISTORY: Aurelie returns today for follow up and treatment of her estrogen receptor positive breast cancer.    She continues with weekly paclitaxel treatments, started on 08/26/2020. Today is week 9 of her treatment.  She notes that she is tolerating chemotherapy quite well.  She has no peripheral neuropathy.  She is mildly fatigued, but otherwise denies any new issues today.  She notes her activity level has been quite good despite her fatigue.    REVIEW OF SYSTEMS: Review of Systems  Constitutional:  Negative for appetite change, chills, fatigue, fever and unexpected weight change.  HENT:   Negative for hearing loss, lump/mass and trouble swallowing.   Eyes:  Negative for eye problems and icterus.  Respiratory:  Negative for chest tightness, cough and shortness of breath.   Cardiovascular:  Negative for chest pain, leg swelling and palpitations.  Gastrointestinal:  Negative for abdominal distention, abdominal pain, constipation, diarrhea, nausea and vomiting.  Endocrine: Negative for hot flashes.  Genitourinary:  Negative for difficulty urinating.   Musculoskeletal:  Negative for arthralgias.  Skin:   Negative for itching and rash.  Neurological:  Negative for dizziness, extremity weakness, headaches and numbness.  Hematological:  Negative for adenopathy. Does not bruise/bleed easily.  Psychiatric/Behavioral:  Negative for depression. The patient is not nervous/anxious.      COVID 19 VACCINATION STATUS: fully vaccinated [employer]), with booster 12/2019   HISTORY OF CURRENT ILLNESS: From the original intake note:   I saw Ms. Linville in December 2018 for evaluation of atypical lobular hyperplasia.  We discussed various strategies regarding breast cancer prevention at that time for her to consider.    More recently (around the time of the Super Bowl 2022) she found a lump in her right breast.  She underwent bilateral diagnostic mammography with tomography and right breast ultrasonography at [facility] on 04/10/2020 showing: breast density category C; palpable 3 cm mass in right breast at 12 o'clock; no enlarged adenopathy in right axilla.  Accordingly on the same day, she proceeded to biopsy of the right breast area in question. The pathology from this procedure ([id number]) showed: invasive ductal carcinoma, grade 3; ductal carcinoma in situ. Prognostic indicators significant for: estrogen receptor, 90% positive with moderate staining intensity and progesterone receptor, 0% negative. Proliferation marker Ki67 at 25%. HER2 equivocal by immunohistochemistry (2+), but negative by fluorescent in situ hybridization with a signals ratio 1.49 and number per cell 2.75.  Cancer Staging Malignant neoplasm of upper-outer quadrant of right breast in female, estrogen receptor positive (HCC) Staging form: Breast, AJCC 8th Edition - Clinical: Stage IIA (cT2, cN0, cM0, G3, ER+, PR+, HER2-) - Signed by Lowella Dell, MD on 04/18/2020  The patient's subsequent history is as detailed below.   PAST MEDICAL HISTORY: Past Medical History:  Diagnosis Date  Asthma    with respiratory infection    Cancer ([place])    breast cancer - right   Diabetes mellitus without complication ([place])    type II   Fatty liver    Fibroid uterus    Hypertension    Patient reports that she has never been diagnosied with HTN, "I take HCTZ for swelling in my feet, if needed."   Osteoarthritis 03/10/2015   PONV (postoperative nausea and vomiting)    2014   Refusal of blood transfusions as patient is Jehovah's Witness    NO BLOOD PRODUCTS   Sleep apnea     PAST SURGICAL HISTORY: Past Surgical History:  Procedure Laterality Date   BREAST CYST ASPIRATION     BREAST EXCISIONAL BIOPSY     BREAST LUMPECTOMY WITH SENTINEL LYMPH NODE BIOPSY Right 05/21/2020   Procedure: RIGHT BREAST LUMPECTOMY WITH SENTINEL LYMPH NODE BIOPSY;  Surgeon: Stark Klein, MD;  Location: [facility];  Service: General;  Laterality: Right;   CHOLECYSTECTOMY  1/11   COLONOSCOPY     DILATATION & CURRETTAGE/HYSTEROSCOPY WITH RESECTOCOPE N/A 03/13/2014   Procedure: DILATATION & CURETTAGE/HYSTEROSCOPY ;  Surgeon: Lyman Speller, MD;  Location: [facility];  Service: Gynecology;  Laterality: N/A;   PORTACATH PLACEMENT Left 05/21/2020   Procedure: INSERTION PORT-A-CATH;  Surgeon: Stark Klein, MD;  Location: [facility];  Service: General;  Laterality: Left;   RADIOACTIVE SEED GUIDED EXCISIONAL BREAST BIOPSY Left 12/30/2016   Procedure: RADIOACTIVE SEED GUIDED EXCISIONAL BREAST BIOPSY;  Surgeon: Stark Klein, MD;  Location: [facility];  Service: General;  Laterality: Left;    FAMILY HISTORY: Family History  Problem Relation Age of Onset   Asthma Mother    Diabetes Mother    Diabetes Father    Heart attack Father    Diabetes Brother    Diabetes Brother    Asthma Brother    Heart Problems Sister    Asthma Sister    Hypertension Sister    Heart disease Maternal Grandfather    Osteopenia Sister   The patient's father died from heart disease at the age of 49.  The patient's mother is 24 years old as of March 2022.  The patient has 4  brothers and 2 sisters.  She is not aware of any history of cancer in her family.   GYNECOLOGIC HISTORY:  Patient's last menstrual period was 02/21/2014. Menarche: 60 years old GX P 0 LMP age 24 Contraceptive 1 year, no complications HRT no  Hysterectomy? no BSO? 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  Her husband [name] works as a Marketing executive for [employer].  He has 2 children from a prior marriage, both living in [place].  One is traveling nurse.  The other 1 is a Administrator.  The patient is a Restaurant manager, fast food.    ADVANCED DIRECTIVES: In the absence of any documentation to the contrary, the patient's spouse is their HCPOA.  The patient made it clear at her visit 04/18/2020 that she would not accept any blood transfusion or blood products for any reason including to stave off death   HEALTH MAINTENANCE: Social History   Tobacco Use   Smoking status: Never   Smokeless tobacco: Never  Vaping Use   Vaping Use: Never used  Substance Use Topics   Alcohol use: No   Drug use: No     Colonoscopy: 10/2019 (Dr. Collene Mares), recall 2026  PAP: 03/2017, negative  Bone density: 09/2018, -0.8  Allergies  Allergen Reactions   Lisinopril Shortness Of Breath and Cough    wheezing    Current Outpatient Medications  Medication Sig Dispense Refill   albuterol (PROVENTIL HFA;VENTOLIN HFA) 108 (90 Base) MCG/ACT inhaler Inhale 1-2 puffs into the lungs every 4 (four) hours as needed for wheezing or shortness of breath. 18 g 2   atorvastatin (LIPITOR) 10 MG tablet Take 10 mg by mouth every other day.     b complex vitamins capsule Take 1 capsule by mouth every other day.     B-D ULTRA-FINE 33 LANCETS MISC Check BG 2 times/day     Carboxymethylcellul-Glycerin (REFRESH OPTIVE OP) Place 1 drop into both eyes 3 (three) times daily as needed (dry / irritated eyes).     cholecalciferol (VITAMIN D3) 25 MCG (1000 UNIT) tablet Take 1,000 Units by mouth every other  day.     doxycycline (VIBRA-TABS) 100 MG tablet Take 100 mg by mouth 2 (two) times daily. (Patient not taking: No sig reported)     empagliflozin (JARDIANCE) 10 MG TABS tablet Take 1 tablet (10 mg total) by mouth daily before breakfast. 30 tablet 6   fluticasone (FLONASE) 50 MCG/ACT nasal spray Place 1 spray into both nostrils daily as needed for allergies or rhinitis.     glucose blood test strip Check BG 2 times/day.  Dx E11.65     lidocaine-prilocaine (EMLA) cream Apply to affected area once 30 g 3   loratadine (CLARITIN) 10 MG tablet Take 1 tablet (10 mg total) by mouth daily. 90 tablet 0   milk thistle 175 MG tablet Take 175 mg by mouth every other day.     Omega-3 Fatty Acids (OMEGA 3 PO) Take 1 capsule by mouth every other day.     omeprazole (PRILOSEC) 40 MG capsule Take 40 mg by mouth daily as needed (acid reflux).     prochlorperazine (COMPAZINE) 5 MG tablet Take 1 tablet (5 mg total) by mouth every 6 (six) hours as needed for nausea or vomiting. 30 tablet 0   valACYclovir (VALTREX) 500 MG tablet Take 1 tablet (500 mg total) by mouth daily. 60 tablet 1   vitamin C (ASCORBIC ACID) 500 MG tablet Take 500 mg by mouth every other day.     No current facility-administered medications for this visit.   Facility-Administered Medications Ordered in Other Visits  Medication Dose Route Frequency Provider Last Rate Last Admin   dexamethasone (DECADRON) injection 4 mg  4 mg Intravenous Once Magrinat, Virgie Dad, MD       diphenhydrAMINE (BENADRYL) injection 25 mg  25 mg Intravenous Once Magrinat, Virgie Dad, MD       famotidine (PEPCID) IVPB 20 mg in NS 100 mL IVPB  20 mg Intravenous Once Magrinat, Virgie Dad, MD       heparin lock flush 100 unit/mL  500 Units Intracatheter Once PRN Magrinat, Virgie Dad, MD       PACLitaxel (TAXOL) 162 mg in sodium chloride 0.9 % 250 mL chemo infusion (</= $RemoveBefor'80mg'RVYjzdCwZowK$ /m2)  80 mg/m2 (Treatment Plan Recorded) Intravenous Once Magrinat, Virgie Dad, MD       sodium chloride flush  (NS) 0.9 % injection 10 mL  10 mL Intracatheter PRN Magrinat, Virgie Dad, MD       sodium chloride flush (NS) 0.9 % injection 10 mL  10 mL Intracatheter PRN Magrinat, Virgie Dad, MD       SUPPLEMENTS: Aside from the medications listed above, the patient takes a teaspoon of olive oil daily, 1000  mg of vitamin C every other day, sunflower lecithin 600 mg every other day, vitamin D3 5000 mg every other day, magnesium 200 mg every other day, milk thistle 150 mg every other day, a B complex vitamin (B6 and B12) every other day, as well as Osteo Bi-Flex weekly and fluticasone as needed  OBJECTIVE: African-American woman in no acute distress  There were no vitals filed for this visit.    There is no height or weight on file to calculate BMI.   Wt Readings from Last 3 Encounters:  10/23/20 184 lb 6.4 oz (83.6 kg)  10/17/20 183 lb 6.4 oz (83.2 kg)  10/16/20 179 lb 1.9 oz (81.2 kg)     ECOG FS:1 - Symptomatic but completely ambulatory GENERAL: Patient is a well appearing female in no acute distress HEENT:  Sclerae anicteric.  Oropharynx clear and moist. No ulcerations or evidence of oropharyngeal candidiasis. Neck is supple.  NODES:  No cervical, supraclavicular, or axillary lymphadenopathy palpated.  BREAST EXAM:  Deferred. LUNGS:  Clear to auscultation bilaterally.  No wheezes or rhonchi. HEART:  Regular rate and rhythm. No murmur appreciated. ABDOMEN:  Soft, nontender.  Positive, normoactive bowel sounds. No organomegaly palpated. MSK:  No focal spinal tenderness to palpation. Full range of motion bilaterally in the upper extremities. EXTREMITIES:  No peripheral edema.   SKIN:  Clear with no obvious rashes or skin changes. No nail dyscrasia. NEURO:  Nonfocal. Well oriented.  Appropriate affect.   LAB RESULTS:  CMP     Component Value Date/Time   NA 137 10/23/2020 1156   NA 138 11/04/2017 0000   K 4.3 10/23/2020 1156   CL 104 10/23/2020 1156   CO2 23 10/23/2020 1156   GLUCOSE 126 (H)  10/23/2020 1156   BUN 11 10/23/2020 1156   BUN 14 11/04/2017 0000   CREATININE 0.70 10/23/2020 1156   CREATININE 0.81 04/18/2020 1527   CREATININE 0.60 09/07/2014 0950   CALCIUM 10.0 10/23/2020 1156   CALCIUM 9.7 10/02/2020 1030   PROT 6.6 10/23/2020 1156   ALBUMIN 3.5 10/23/2020 1156   AST 22 10/23/2020 1156   AST 26 04/18/2020 1527   ALT 20 10/23/2020 1156   ALT 37 04/18/2020 1527   ALKPHOS 137 (H) 10/23/2020 1156   BILITOT 0.2 (L) 10/23/2020 1156   BILITOT 0.3 04/18/2020 1527   GFRNONAA >60 10/23/2020 1156   GFRNONAA >60 04/18/2020 1527   GFRAA >60 05/15/2019 1803    No results found for: TOTALPROTELP, ALBUMINELP, A1GS, A2GS, BETS, BETA2SER, GAMS, MSPIKE, SPEI  Lab Results  Component Value Date   WBC 5.7 10/23/2020   NEUTROABS 3.4 10/23/2020   HGB 9.9 (L) 10/23/2020   HCT 31.4 (L) 10/23/2020   MCV 101.6 (H) 10/23/2020   PLT 314 10/23/2020    No results found for: LABCA2  No components found for: QMGNOI370  No results for input(s): INR in the last 168 hours.  No results found for: LABCA2  No results found for: WUG891  No results found for: QXI503  No results found for: UUE280  No results found for: CA2729  No components found for: HGQUANT  No results found for: CEA1 / No results found for: CEA1   No results found for: AFPTUMOR  No results found for: CHROMOGRNA  No results found for: KPAFRELGTCHN, LAMBDASER, KAPLAMBRATIO (kappa/lambda light chains)  No results found for: HGBA, HGBA2QUANT, HGBFQUANT, HGBSQUAN (Hemoglobinopathy evaluation)   No results found for: LDH  Lab Results  Component Value Date   IRON 38 (L)  10/16/2009   IRONPCTSAT 10.2 (L) 10/16/2009   (Iron and TIBC)  Lab Results  Component Value Date   FERRITIN 8.9 (L) 10/16/2009    Urinalysis    Component Value Date/Time   COLORURINE YELLOW 03/11/2009 1100   APPEARANCEUR CLEAR 03/11/2009 1100   LABSPEC 1.017 03/11/2009 1100   PHURINE 7.0 03/11/2009 1100   GLUCOSEU NEGATIVE  03/11/2009 1100   HGBUR LARGE (A) 03/11/2009 1100   BILIRUBINUR N 05/21/2015 1614   KETONESUR NEGATIVE 03/11/2009 1100   PROTEINUR N 05/21/2015 1614   PROTEINUR NEGATIVE 03/11/2009 1100   UROBILINOGEN negative 05/21/2015 1614   UROBILINOGEN 0.2 03/11/2009 1100   NITRITE N 05/21/2015 1614   NITRITE NEGATIVE 03/11/2009 1100   LEUKOCYTESUR Negative 05/21/2015 1614    STUDIES: No results found.   ELIGIBLE FOR AVAILABLE RESEARCH PROTOCOL: no  ASSESSMENT: 60 y.o. [place] woman status post right breast upper outer quadrant biopsy 04/10/2020 for a clinical T2N0, stage IIA invasive ductal carcinoma, grade 3, estrogen receptor positive, progesterone receptor and HER-2 negative, with an MIB-1 of 25%.  (1) Oncotype obtained from the original biopsy shows a score of 48, predicting a recurrence rate outside the breast within 9 years of 28% if the only systemic therapy is antiestrogen for 5 years.  It also predicts a greater than 15% benefit from chemotherapy.  (2) right lumpectomy and sentinel lymph node dissection 05/21/2020 shows a pT2 pN0, stage IIB invasive ductal carcinoma, grade 3, with negative margins.  (3) adjuvant chemotherapy to consist of cyclophosphamide and doxorubicin in dose dense fashion x4 starting 06/18/2020, with Neulasta day 3; to be followed by paclitaxel weekly x12 started 08/28/2020.  (a) echo 05/13/2020 shows an ejection fraction in the 55-60% range  (4) adjuvant radiation  (5) antiestrogens.   PLAN: Tashae is here for follow-up and evaluation of her breast cancer prior to receiving weekly Paclitaxel chemotherapy.  She is tolerating her treatment quite well and will proceed with cycle 9/12 today. We are monitoring her closely for neuropathy which she hasn't developed.  Madelynne has maintained a good activity level and I encouraged her to continue to do so.  I reviewed with her that at this point in her treatment we will see her with every cycle of chemotherapy to make sure  she tolerates well throughout.    Jaziya will return in one week for labs, f/u, and her next Taxol.  She knows to call for any questions that may arise between now and her next appointment.  We are happy to see her sooner if needed.  Total encounter time 20 minutes.Wilber Bihari, NP 10/23/20 1:32 PM Medical Oncology and Hematology [facility] [place], [place] [address] Tel. [phone]    Fax. [phone]   *Total Encounter Time as defined by the Centers for Medicare and Medicaid Services includes, in addition to the face-to-face time of a patient visit (documented in the note above) non-face-to-face time: obtaining and reviewing outside history, ordering and reviewing medications, tests or procedures, care coordination (communications with other health care professionals or caregivers) and documentation in the medical record.

## 2020-10-24 ENCOUNTER — Telehealth: Payer: Self-pay | Admitting: Oncology

## 2020-10-24 ENCOUNTER — Encounter: Payer: Self-pay | Admitting: Oncology

## 2020-10-24 NOTE — Telephone Encounter (Signed)
Scheduled appointment per 09/07 los. Left message.

## 2020-10-30 ENCOUNTER — Inpatient Hospital Stay: Payer: 59

## 2020-10-30 ENCOUNTER — Encounter: Payer: Self-pay | Admitting: Adult Health

## 2020-10-30 ENCOUNTER — Inpatient Hospital Stay (HOSPITAL_BASED_OUTPATIENT_CLINIC_OR_DEPARTMENT_OTHER): Payer: 59 | Admitting: Adult Health

## 2020-10-30 ENCOUNTER — Ambulatory Visit: Payer: 59 | Admitting: Oncology

## 2020-10-30 ENCOUNTER — Other Ambulatory Visit: Payer: Self-pay

## 2020-10-30 VITALS — BP 139/79 | HR 87 | Temp 98.8°F | Resp 18 | Ht 63.0 in | Wt 182.1 lb

## 2020-10-30 DIAGNOSIS — C50411 Malignant neoplasm of upper-outer quadrant of right female breast: Secondary | ICD-10-CM

## 2020-10-30 DIAGNOSIS — Z170 Estrogen receptor positive status [ER+]: Secondary | ICD-10-CM

## 2020-10-30 DIAGNOSIS — Z95828 Presence of other vascular implants and grafts: Secondary | ICD-10-CM

## 2020-10-30 DIAGNOSIS — Z5111 Encounter for antineoplastic chemotherapy: Secondary | ICD-10-CM | POA: Diagnosis not present

## 2020-10-30 LAB — COMPREHENSIVE METABOLIC PANEL
ALT: 26 U/L (ref 0–44)
AST: 23 U/L (ref 15–41)
Albumin: 3.5 g/dL (ref 3.5–5.0)
Alkaline Phosphatase: 136 U/L — ABNORMAL HIGH (ref 38–126)
Anion gap: 9 (ref 5–15)
BUN: 11 mg/dL (ref 8–23)
CO2: 25 mmol/L (ref 22–32)
Calcium: 10.1 mg/dL (ref 8.9–10.3)
Chloride: 105 mmol/L (ref 98–111)
Creatinine, Ser: 0.72 mg/dL (ref 0.44–1.00)
GFR, Estimated: >60 mL/min (ref >60)
Glucose, Bld: 196 mg/dL — ABNORMAL HIGH (ref 70–99)
Potassium: 3.8 mmol/L (ref 3.5–5.1)
Sodium: 139 mmol/L (ref 135–145)
Total Bilirubin: 0.3 mg/dL (ref 0.3–1.2)
Total Protein: 6.4 g/dL — ABNORMAL LOW (ref 6.5–8.1)

## 2020-10-30 LAB — CBC WITH DIFFERENTIAL/PLATELET
Abs Immature Granulocytes: 0.04 10*3/uL (ref 0.00–0.07)
Basophils Absolute: 0 10*3/uL (ref 0.0–0.1)
Basophils Relative: 1 %
Eosinophils Absolute: 0.2 10*3/uL (ref 0.0–0.5)
Eosinophils Relative: 4 %
HCT: 31.5 % — ABNORMAL LOW (ref 36.0–46.0)
Hemoglobin: 10.4 g/dL — ABNORMAL LOW (ref 12.0–15.0)
Immature Granulocytes: 1 %
Lymphocytes Relative: 22 %
Lymphs Abs: 1.1 10*3/uL (ref 0.7–4.0)
MCH: 32.9 pg (ref 26.0–34.0)
MCHC: 33 g/dL (ref 30.0–36.0)
MCV: 99.7 fL (ref 80.0–100.0)
Monocytes Absolute: 0.5 10*3/uL (ref 0.1–1.0)
Monocytes Relative: 10 %
Neutro Abs: 3 10*3/uL (ref 1.7–7.7)
Neutrophils Relative %: 62 %
Platelets: 308 10*3/uL (ref 150–400)
RBC: 3.16 MIL/uL — ABNORMAL LOW (ref 3.87–5.11)
RDW: 15.6 % — ABNORMAL HIGH (ref 11.5–15.5)
WBC: 4.9 10*3/uL (ref 4.0–10.5)
nRBC: 0 % (ref 0.0–0.2)

## 2020-10-30 MED ORDER — SODIUM CHLORIDE 0.9 % IV SOLN: Freq: Once | INTRAVENOUS | Status: AC

## 2020-10-30 MED ORDER — DEXAMETHASONE SODIUM PHOSPHATE 10 MG/ML IJ SOLN
4.0000 mg | Freq: Once | INTRAMUSCULAR | Status: AC
  Administered 2020-10-30: 4 mg via INTRAVENOUS
  Filled 2020-10-30: qty 1

## 2020-10-30 MED ORDER — SODIUM CHLORIDE 0.9% FLUSH
10.0000 mL | INTRAVENOUS | Status: DC | PRN
  Administered 2020-10-30: 10 mL

## 2020-10-30 MED ORDER — HEPARIN SOD (PORK) LOCK FLUSH 100 UNIT/ML IV SOLN
500.0000 [IU] | Freq: Once | INTRAVENOUS | Status: AC | PRN
  Administered 2020-10-30: 500 [IU]

## 2020-10-30 MED ORDER — DIPHENHYDRAMINE HCL 50 MG/ML IJ SOLN
25.0000 mg | Freq: Once | INTRAMUSCULAR | Status: AC
  Administered 2020-10-30: 25 mg via INTRAVENOUS
  Filled 2020-10-30: qty 1

## 2020-10-30 MED ORDER — SODIUM CHLORIDE 0.9 % IV SOLN
80.0000 mg/m2 | Freq: Once | INTRAVENOUS | Status: AC
  Administered 2020-10-30: 162 mg via INTRAVENOUS
  Filled 2020-10-30: qty 27

## 2020-10-30 MED ORDER — FAMOTIDINE 20 MG IN NS 100 ML IVPB
20.0000 mg | Freq: Once | INTRAVENOUS | Status: AC
  Administered 2020-10-30: 20 mg via INTRAVENOUS
  Filled 2020-10-30: qty 100

## 2020-10-30 NOTE — Patient Instructions (Signed)
[facility] MEDICAL ONCOLOGY   Discharge Instructions: Thank you for choosing [facility] to provide your oncology and hematology care.   If you have a lab appointment with the Cancer Center, please go directly to the Cancer Center and check in at the registration area.   Wear comfortable clothing and clothing appropriate for easy access to any Portacath or PICC line.   We strive to give you quality time with your provider. You may need to reschedule your appointment if you arrive late (15 or more minutes).  Arriving late affects you and other patients whose appointments are after yours.  Also, if you miss three or more appointments without notifying the office, you may be dismissed from the clinic at the provider's discretion.      For prescription refill requests, have your pharmacy contact our office and allow 72 hours for refills to be completed.    Today you received the following chemotherapy and/or immunotherapy agents: paclitaxel.      To help prevent nausea and vomiting after your treatment, we encourage you to take your nausea medication as directed.  BELOW ARE SYMPTOMS THAT SHOULD BE REPORTED IMMEDIATELY: *FEVER GREATER THAN 100.4 F (38 C) OR HIGHER *CHILLS OR SWEATING *NAUSEA AND VOMITING THAT IS NOT CONTROLLED WITH YOUR NAUSEA MEDICATION *UNUSUAL SHORTNESS OF BREATH *UNUSUAL BRUISING OR BLEEDING *URINARY PROBLEMS (pain or burning when urinating, or frequent urination) *BOWEL PROBLEMS (unusual diarrhea, constipation, pain near the anus) TENDERNESS IN MOUTH AND THROAT WITH OR WITHOUT PRESENCE OF ULCERS (sore throat, sores in mouth, or a toothache) UNUSUAL RASH, SWELLING OR PAIN  UNUSUAL VAGINAL DISCHARGE OR ITCHING   Items with * indicate a potential emergency and should be followed up as soon as possible or go to the Emergency Department if any problems should occur.  Please show the CHEMOTHERAPY ALERT CARD or IMMUNOTHERAPY ALERT CARD at check-in  to the Emergency Department and triage nurse.  Should you have questions after your visit or need to cancel or reschedule your appointment, please contact [facility] MEDICAL ONCOLOG[facility]  Dept: [phone]  and follow the prompts.  Office hours are 8:00 a.m. to 4:30 p.m. Monday - Friday. Please note that voicemails left after 4:00 p.m. may not be returned until the following business day.  We are closed weekends and major holidays. You have access to a nurse at all times for urgent questions. Please call the main number to the clinic Dept: [phone] and follow the prompts.   For any non-urgent questions, you may also contact your provider using MyChart. We now offer e-Visits for anyone 18 and older to request care online for non-urgent symptoms. For details visit [email or web address].   Also download the MyChart app! Go to the app store, search "MyChart", open the app, select [facility], and log in with your MyChart username and password.  Due to Covid, a mask is required upon entering the hospital/clinic. If you do not have a mask, one will be given to you upon arrival. For doctor visits, patients may have 1 support person aged 18 or older with them. For treatment visits, patients cannot have anyone with them due to current Covid guidelines and our immunocompromised population.   

## 2020-10-30 NOTE — Progress Notes (Signed)
[place]  [phone] [phone]     ID: Yolanda [name] DOB: 05-20-1960  MR#: [id number]  FWY#:[id number]  Patient Care Team: Mosie Lukes, MD as PCP - General (Family Medicine) Michel Bickers, MD as Consulting Physician (Infectious Diseases) Megan Salon, MD as Consulting Physician (Gynecology) Magrinat, Virgie Dad, MD as Consulting Physician (Oncology) Juanita Craver, MD as Consulting Physician (Gastroenterology) Stark Klein, MD as Consulting Physician (General Surgery) Mauro Kaufmann, RN as Oncology Nurse Navigator Rockwell Germany, RN as Oncology Nurse Navigator Amedeo Kinsman, [place] (Optometry) Scot Dock, NP OTHER MD:   CHIEF COMPLAINT: Estrogen receptor positive breast cancer  CURRENT TREATMENT: Adjuvant chemotherapy   INTERVAL HISTORY: Yolanda [name] returns today for follow up and treatment of her estrogen receptor positive breast cancer.    She continues with weekly paclitaxel treatments, started on 08/26/2020. Today is week 10 of therapy.  Yolanda [name] is doing quite well.  She denies any new issues today.  She is mildly fatigued but notes that she gets up anyway and does things.  She is feeling moderately well.  She let me know that she has a continued wound in her right breast that she sees Dr. Barry Dienes for.  She packs the wound and covers it with gauze.  She denies any erythema, purulent drainage, or swelling around the site.  Yolanda [name] denies peripheral neuropathy.  REVIEW OF SYSTEMS: Review of Systems  Constitutional:  Positive for fatigue. Negative for appetite change, chills, fever and unexpected weight change.  HENT:   Negative for hearing loss, lump/mass and trouble swallowing.   Eyes:  Negative for eye problems and icterus.  Respiratory:  Negative for chest tightness, cough and shortness of breath.   Cardiovascular:  Negative for chest pain, leg swelling and palpitations.  Gastrointestinal:  Negative for abdominal distention, abdominal  pain, constipation, diarrhea, nausea and vomiting.  Endocrine: Negative for hot flashes.  Genitourinary:  Negative for difficulty urinating.   Musculoskeletal:  Negative for arthralgias.  Skin:  Negative for itching and rash.  Neurological:  Negative for dizziness, extremity weakness, headaches and numbness.  Hematological:  Negative for adenopathy. Does not bruise/bleed easily.  Psychiatric/Behavioral:  Negative for depression. The patient is not nervous/anxious.      COVID 19 VACCINATION STATUS: fully vaccinated [employer]), with booster 12/2019   HISTORY OF CURRENT ILLNESS: From the original intake note:   I saw [name] in December 2018 for evaluation of atypical lobular hyperplasia.  We discussed various strategies regarding breast cancer prevention at that time for her to consider.    More recently (around the time of the Super Bowl 2022) she found a lump in her right breast.  She underwent bilateral diagnostic mammography with tomography and right breast ultrasonography at [facility] on 04/10/2020 showing: breast density category C; palpable 3 cm mass in right breast at 12 o'clock; no enlarged adenopathy in right axilla.  Accordingly on the same day, she proceeded to biopsy of the right breast area in question. The pathology from this procedure ([id number]) showed: invasive ductal carcinoma, grade 3; ductal carcinoma in situ. Prognostic indicators significant for: estrogen receptor, 90% positive with moderate staining intensity and progesterone receptor, 0% negative. Proliferation marker Ki67 at 25%. HER2 equivocal by immunohistochemistry (2+), but negative by fluorescent in situ hybridization with a signals ratio 1.49 and number per cell 2.75.  Cancer Staging Malignant neoplasm of upper-outer quadrant of right breast in female, estrogen receptor positive ([place]) Staging form: Breast, AJCC 8th Edition - Clinical:  Stage IIA (cT2, cN0, cM0, G3, ER+, PR+, HER2-) - Signed by  Chauncey Cruel, MD on 04/18/2020  The patient's subsequent history is as detailed below.   PAST MEDICAL HISTORY: Past Medical History:  Diagnosis Date   Asthma    with respiratory infection   Cancer ([place])    breast cancer - right   Diabetes mellitus without complication ([place])    type II   Fatty liver    Fibroid uterus    Hypertension    Patient reports that she has never been diagnosied with HTN, "I take HCTZ for swelling in my feet, if needed."   Osteoarthritis 03/10/2015   PONV (postoperative nausea and vomiting)    2014   Refusal of blood transfusions as patient is Jehovah's Witness    NO BLOOD PRODUCTS   Sleep apnea     PAST SURGICAL HISTORY: Past Surgical History:  Procedure Laterality Date   BREAST CYST ASPIRATION     BREAST EXCISIONAL BIOPSY     BREAST LUMPECTOMY WITH SENTINEL LYMPH NODE BIOPSY Right 05/21/2020   Procedure: RIGHT BREAST LUMPECTOMY WITH SENTINEL LYMPH NODE BIOPSY;  Surgeon: Stark Klein, MD;  Location: [facility];  Service: General;  Laterality: Right;   CHOLECYSTECTOMY  1/11   COLONOSCOPY     DILATATION & CURRETTAGE/HYSTEROSCOPY WITH RESECTOCOPE N/A 03/13/2014   Procedure: DILATATION & CURETTAGE/HYSTEROSCOPY ;  Surgeon: Lyman Speller, MD;  Location: [facility];  Service: Gynecology;  Laterality: N/A;   PORTACATH PLACEMENT Left 05/21/2020   Procedure: INSERTION PORT-A-CATH;  Surgeon: Stark Klein, MD;  Location: [facility];  Service: General;  Laterality: Left;   RADIOACTIVE SEED GUIDED EXCISIONAL BREAST BIOPSY Left 12/30/2016   Procedure: RADIOACTIVE SEED GUIDED EXCISIONAL BREAST BIOPSY;  Surgeon: Stark Klein, MD;  Location: [facility];  Service: General;  Laterality: Left;    FAMILY HISTORY: Family History  Problem Relation Age of Onset   Asthma Mother    Diabetes Mother    Diabetes Father    Heart attack Father    Diabetes Brother    Diabetes Brother    Asthma Brother    Heart Problems Sister    Asthma Sister    Hypertension  Sister    Heart disease Maternal Grandfather    Osteopenia Sister   The patient's father died from heart disease at the age of 56.  The patient's mother is 44 years old as of March 2022.  The patient has 4 brothers and 2 sisters.  She is not aware of any history of cancer in her family.   GYNECOLOGIC HISTORY:  Patient's last menstrual period was 02/21/2014. Menarche: 60 years old GX P 0 LMP age 64 Contraceptive 1 year, no complications HRT no  Hysterectomy? no BSO? 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  Her husband [name] works as a Marketing executive for [employer].  He has 2 children from a prior marriage, both living in [place].  One is traveling nurse.  The other 1 is a Administrator.  The patient is a Restaurant manager, fast food.    ADVANCED DIRECTIVES: In the absence of any documentation to the contrary, the patient's spouse is their HCPOA.  The patient made it clear at her visit 04/18/2020 that she would not accept any blood transfusion or blood products for any reason including to stave off death   HEALTH MAINTENANCE: Social History   Tobacco Use   Smoking status: Never   Smokeless tobacco: Never  Vaping Use  Vaping Use: Never used  Substance Use Topics   Alcohol use: No   Drug use: No     Colonoscopy: 10/2019 (Dr. Collene Mares), recall 2026  PAP: 03/2017, negative  Bone density: 09/2018, -0.8   Allergies  Allergen Reactions   Lisinopril Shortness Of Breath and Cough    wheezing    Current Outpatient Medications  Medication Sig Dispense Refill   albuterol (PROVENTIL HFA;VENTOLIN HFA) 108 (90 Base) MCG/ACT inhaler Inhale 1-2 puffs into the lungs every 4 (four) hours as needed for wheezing or shortness of breath. 18 g 2   atorvastatin (LIPITOR) 10 MG tablet Take 10 mg by mouth every other day.     b complex vitamins capsule Take 1 capsule by mouth every other day.     B-D ULTRA-FINE 33 LANCETS MISC Check BG 2 times/day      Carboxymethylcellul-Glycerin (REFRESH OPTIVE OP) Place 1 drop into both eyes 3 (three) times daily as needed (dry / irritated eyes).     cholecalciferol (VITAMIN D3) 25 MCG (1000 UNIT) tablet Take 1,000 Units by mouth every other day.     doxycycline (VIBRA-TABS) 100 MG tablet Take 100 mg by mouth 2 (two) times daily. (Patient not taking: No sig reported)     empagliflozin (JARDIANCE) 10 MG TABS tablet Take 1 tablet (10 mg total) by mouth daily before breakfast. 30 tablet 6   fluticasone (FLONASE) 50 MCG/ACT nasal spray Place 1 spray into both nostrils daily as needed for allergies or rhinitis.     glucose blood test strip Check BG 2 times/day.  Dx E11.65     lidocaine-prilocaine (EMLA) cream Apply to affected area once 30 g 3   loratadine (CLARITIN) 10 MG tablet Take 1 tablet (10 mg total) by mouth daily. 90 tablet 0   milk thistle 175 MG tablet Take 175 mg by mouth every other day.     Omega-3 Fatty Acids (OMEGA 3 PO) Take 1 capsule by mouth every other day.     omeprazole (PRILOSEC) 40 MG capsule Take 40 mg by mouth daily as needed (acid reflux).     prochlorperazine (COMPAZINE) 5 MG tablet Take 1 tablet (5 mg total) by mouth every 6 (six) hours as needed for nausea or vomiting. 30 tablet 0   valACYclovir (VALTREX) 500 MG tablet Take 1 tablet (500 mg total) by mouth daily. 60 tablet 1   vitamin C (ASCORBIC ACID) 500 MG tablet Take 500 mg by mouth every other day.     No current facility-administered medications for this visit.   Facility-Administered Medications Ordered in Other Visits  Medication Dose Route Frequency Provider Last Rate Last Admin   sodium chloride flush (NS) 0.9 % injection 10 mL  10 mL Intracatheter PRN Magrinat, Virgie Dad, MD       SUPPLEMENTS: Aside from the medications listed above, the patient takes a teaspoon of olive oil daily, 1000 mg of vitamin C every other day, sunflower lecithin 600 mg every other day, vitamin D3 5000 mg every other day, magnesium 200 mg every  other day, milk thistle 150 mg every other day, a B complex vitamin (B6 and B12) every other day, as well as Osteo Bi-Flex weekly and fluticasone as needed  OBJECTIVE: African-American woman in no acute distress  Vitals:   10/30/20 0757  BP: 139/79  Pulse: 87  Resp: 18  Temp: 98.8 F (37.1 C)  SpO2: 100%      Body mass index is 32.26 kg/m.   Wt Readings from Last 3  Encounters:  10/30/20 182 lb 1.6 oz (82.6 kg)  10/23/20 184 lb 6.4 oz (83.6 kg)  10/17/20 183 lb 6.4 oz (83.2 kg)     ECOG FS:1 - Symptomatic but completely ambulatory GENERAL: Patient is a well appearing female in no acute distress HEENT:  Sclerae anicteric.  Oropharynx clear and moist. No ulcerations or evidence of oropharyngeal candidiasis. Neck is supple.  NODES:  No cervical, supraclavicular, or axillary lymphadenopathy palpated.  BREAST EXAM:  Deferred. Partially visualized right breast wound.  She has it dressed with clean packing gauzed covered by 4x4 and tape.  She pulled back the top layer of gauze, the skina round the wound and packing was non erythematous or swollen.  Dressing clean dry and intact. LUNGS:  Clear to auscultation bilaterally.  No wheezes or rhonchi. HEART:  Regular rate and rhythm. No murmur appreciated. ABDOMEN:  Soft, nontender.  Positive, normoactive bowel sounds. No organomegaly palpated. MSK:  No focal spinal tenderness to palpation. Full range of motion bilaterally in the upper extremities. EXTREMITIES:  No peripheral edema.   SKIN:  Clear with no obvious rashes or skin changes. No nail dyscrasia. NEURO:  Nonfocal. Well oriented.  Appropriate affect.   LAB RESULTS:  CMP     Component Value Date/Time   NA 137 10/23/2020 1156   NA 138 11/04/2017 0000   K 4.3 10/23/2020 1156   CL 104 10/23/2020 1156   CO2 23 10/23/2020 1156   GLUCOSE 126 (H) 10/23/2020 1156   BUN 11 10/23/2020 1156   BUN 14 11/04/2017 0000   CREATININE 0.70 10/23/2020 1156   CREATININE 0.81 04/18/2020 1527    CREATININE 0.60 09/07/2014 0950   CALCIUM 10.0 10/23/2020 1156   CALCIUM 9.7 10/02/2020 1030   PROT 6.6 10/23/2020 1156   ALBUMIN 3.5 10/23/2020 1156   AST 22 10/23/2020 1156   AST 26 04/18/2020 1527   ALT 20 10/23/2020 1156   ALT 37 04/18/2020 1527   ALKPHOS 137 (H) 10/23/2020 1156   BILITOT 0.2 (L) 10/23/2020 1156   BILITOT 0.3 04/18/2020 1527   GFRNONAA >60 10/23/2020 1156   GFRNONAA >60 04/18/2020 1527   GFRAA >60 05/15/2019 1803    No results found for: TOTALPROTELP, ALBUMINELP, A1GS, A2GS, BETS, BETA2SER, GAMS, MSPIKE, SPEI  Lab Results  Component Value Date   WBC 5.7 10/23/2020   NEUTROABS 3.4 10/23/2020   HGB 9.9 (L) 10/23/2020   HCT 31.4 (L) 10/23/2020   MCV 101.6 (H) 10/23/2020   PLT 314 10/23/2020    No results found for: LABCA2  No components found for: [id number]  No results for input(s): INR in the last 168 hours.  No results found for: LABCA2  No results found for: EHM094  No results found for: BSJ628  No results found for: ZMO294  No results found for: CA2729  No components found for: HGQUANT  No results found for: CEA1 / No results found for: CEA1   No results found for: AFPTUMOR  No results found for: CHROMOGRNA  No results found for: KPAFRELGTCHN, LAMBDASER, KAPLAMBRATIO (kappa/lambda light chains)  No results found for: HGBA, HGBA2QUANT, HGBFQUANT, HGBSQUAN (Hemoglobinopathy evaluation)   No results found for: LDH  Lab Results  Component Value Date   IRON 38 (L) 10/16/2009   IRONPCTSAT 10.2 (L) 10/16/2009   (Iron and TIBC)  Lab Results  Component Value Date   FERRITIN 8.9 (L) 10/16/2009    Urinalysis    Component Value Date/Time   COLORURINE YELLOW 03/11/2009 [place] 03/11/2009  1100   LABSPEC 1.017 03/11/2009 1100   PHURINE 7.0 03/11/2009 1100   GLUCOSEU NEGATIVE 03/11/2009 1100   HGBUR LARGE (A) 03/11/2009 1100   BILIRUBINUR N 05/21/2015 [place] 03/11/2009 1100   PROTEINUR N  05/21/2015 1614   PROTEINUR NEGATIVE 03/11/2009 1100   UROBILINOGEN negative 05/21/2015 1614   UROBILINOGEN 0.2 03/11/2009 1100   NITRITE N 05/21/2015 1614   NITRITE NEGATIVE 03/11/2009 1100   LEUKOCYTESUR Negative 05/21/2015 1614    STUDIES: No results found.   ELIGIBLE FOR AVAILABLE RESEARCH PROTOCOL: no  ASSESSMENT: 60 y.o. [place] woman status post right breast upper outer quadrant biopsy 04/10/2020 for a clinical T2N0, stage IIA invasive ductal carcinoma, grade 3, estrogen receptor positive, progesterone receptor and HER-2 negative, with an MIB-1 of 25%.  (1) Oncotype obtained from the original biopsy shows a score of 48, predicting a recurrence rate outside the breast within 9 years of 28% if the only systemic therapy is antiestrogen for 5 years.  It also predicts a greater than 15% benefit from chemotherapy.  (2) right lumpectomy and sentinel lymph node dissection 05/21/2020 shows a pT2 pN0, stage IIB invasive ductal carcinoma, grade 3, with negative margins.  (3) adjuvant chemotherapy to consist of cyclophosphamide and doxorubicin in dose dense fashion x4 starting 06/18/2020, with Neulasta day 3; to be followed by paclitaxel weekly x12 started 08/28/2020.  (a) echo 05/13/2020 shows an ejection fraction in the 55-60% range  (4) adjuvant radiation  (5) antiestrogens.   PLAN: Trysten is here for follow-up and evaluation of her breast cancer prior to receiving weekly Paclitaxel chemotherapy.  She is tolerating her treatment so well.  Her cbc is normal and cmet is pending at time of appt completion.  She will proceed with cycle 10 of treatment so long as her CMET is within administration parameters.    Lailani has no peripheral neuropathy which is great.  Her fatigue is understandable due to the treatment she has received.  She has remained active which can actually help.    Jode and I reviewed that we will continue to see her weekly for labs, f/u and treatment as she finishes her  final cycles of Paclitaxel.  She will see Dr. Jana Hakim next week.  She knows to call for any questions that may arise between now and her next appointment.  We are happy to see her sooner if needed.  Total encounter time 20 minutes.Wilber Bihari, NP 10/30/20 8:07 AM Medical Oncology and Hematology [facility] [place], [place] [address] Tel. [phone]    Fax. [phone]   *Total Encounter Time as defined by the Centers for Medicare and Medicaid Services includes, in addition to the face-to-face time of a patient visit (documented in the note above) non-face-to-face time: obtaining and reviewing outside history, ordering and reviewing medications, tests or procedures, care coordination (communications with other health care professionals or caregivers) and documentation in the medical record.

## 2020-11-06 ENCOUNTER — Inpatient Hospital Stay: Payer: 59 | Admitting: Oncology

## 2020-11-06 ENCOUNTER — Inpatient Hospital Stay: Payer: 59

## 2020-11-06 ENCOUNTER — Other Ambulatory Visit: Payer: Self-pay

## 2020-11-06 ENCOUNTER — Other Ambulatory Visit: Payer: Self-pay | Admitting: *Deleted

## 2020-11-06 ENCOUNTER — Inpatient Hospital Stay (HOSPITAL_BASED_OUTPATIENT_CLINIC_OR_DEPARTMENT_OTHER): Payer: 59 | Admitting: Adult Health

## 2020-11-06 ENCOUNTER — Encounter: Payer: Self-pay | Admitting: *Deleted

## 2020-11-06 VITALS — BP 143/86 | HR 85 | Temp 98.6°F | Resp 18 | Ht 63.0 in | Wt 182.5 lb

## 2020-11-06 DIAGNOSIS — Z170 Estrogen receptor positive status [ER+]: Secondary | ICD-10-CM

## 2020-11-06 DIAGNOSIS — Z5111 Encounter for antineoplastic chemotherapy: Secondary | ICD-10-CM | POA: Diagnosis not present

## 2020-11-06 DIAGNOSIS — Z95828 Presence of other vascular implants and grafts: Secondary | ICD-10-CM

## 2020-11-06 DIAGNOSIS — C50411 Malignant neoplasm of upper-outer quadrant of right female breast: Secondary | ICD-10-CM

## 2020-11-06 LAB — CBC WITH DIFFERENTIAL/PLATELET
Abs Immature Granulocytes: 0.03 10*3/uL (ref 0.00–0.07)
Basophils Absolute: 0 10*3/uL (ref 0.0–0.1)
Basophils Relative: 1 %
Eosinophils Absolute: 0.2 10*3/uL (ref 0.0–0.5)
Eosinophils Relative: 3 %
HCT: 33.3 % — ABNORMAL LOW (ref 36.0–46.0)
Hemoglobin: 10.7 g/dL — ABNORMAL LOW (ref 12.0–15.0)
Immature Granulocytes: 1 %
Lymphocytes Relative: 23 %
Lymphs Abs: 1.1 10*3/uL (ref 0.7–4.0)
MCH: 32.5 pg (ref 26.0–34.0)
MCHC: 32.1 g/dL (ref 30.0–36.0)
MCV: 101.2 fL — ABNORMAL HIGH (ref 80.0–100.0)
Monocytes Absolute: 0.5 10*3/uL (ref 0.1–1.0)
Monocytes Relative: 10 %
Neutro Abs: 2.9 10*3/uL (ref 1.7–7.7)
Neutrophils Relative %: 62 %
Platelets: 293 10*3/uL (ref 150–400)
RBC: 3.29 MIL/uL — ABNORMAL LOW (ref 3.87–5.11)
RDW: 15.3 % (ref 11.5–15.5)
WBC: 4.7 10*3/uL (ref 4.0–10.5)
nRBC: 0 % (ref 0.0–0.2)

## 2020-11-06 LAB — COMPREHENSIVE METABOLIC PANEL
ALT: 21 U/L (ref 0–44)
AST: 18 U/L (ref 15–41)
Albumin: 3.5 g/dL (ref 3.5–5.0)
Alkaline Phosphatase: 144 U/L — ABNORMAL HIGH (ref 38–126)
Anion gap: 10 (ref 5–15)
BUN: 15 mg/dL (ref 8–23)
CO2: 25 mmol/L (ref 22–32)
Calcium: 10.3 mg/dL (ref 8.9–10.3)
Chloride: 104 mmol/L (ref 98–111)
Creatinine, Ser: 0.71 mg/dL (ref 0.44–1.00)
GFR, Estimated: >60 mL/min (ref >60)
Glucose, Bld: 228 mg/dL — ABNORMAL HIGH (ref 70–99)
Potassium: 3.9 mmol/L (ref 3.5–5.1)
Sodium: 139 mmol/L (ref 135–145)
Total Bilirubin: 0.3 mg/dL (ref 0.3–1.2)
Total Protein: 6.6 g/dL (ref 6.5–8.1)

## 2020-11-06 MED ORDER — SODIUM CHLORIDE 0.9% FLUSH
10.0000 mL | INTRAVENOUS | Status: DC | PRN
  Administered 2020-11-06: 10 mL

## 2020-11-06 MED ORDER — FAMOTIDINE 20 MG IN NS 100 ML IVPB
20.0000 mg | Freq: Once | INTRAVENOUS | Status: AC
  Administered 2020-11-06: 20 mg via INTRAVENOUS
  Filled 2020-11-06: qty 100

## 2020-11-06 MED ORDER — DIPHENHYDRAMINE HCL 50 MG/ML IJ SOLN
25.0000 mg | Freq: Once | INTRAMUSCULAR | Status: AC
  Administered 2020-11-06: 25 mg via INTRAVENOUS
  Filled 2020-11-06: qty 1

## 2020-11-06 MED ORDER — DEXAMETHASONE SODIUM PHOSPHATE 10 MG/ML IJ SOLN
4.0000 mg | Freq: Once | INTRAMUSCULAR | Status: AC
  Administered 2020-11-06: 4 mg via INTRAVENOUS
  Filled 2020-11-06: qty 1

## 2020-11-06 MED ORDER — MAGIC MOUTHWASH W/LIDOCAINE: 5.0000 mL | Freq: Four times a day (QID) | ORAL | 1 refills | Status: DC | PRN

## 2020-11-06 MED ORDER — SODIUM CHLORIDE 0.9 % IV SOLN: Freq: Once | INTRAVENOUS | Status: AC

## 2020-11-06 MED ORDER — HEPARIN SOD (PORK) LOCK FLUSH 100 UNIT/ML IV SOLN
500.0000 [IU] | Freq: Once | INTRAVENOUS | Status: AC | PRN
  Administered 2020-11-06: 500 [IU]

## 2020-11-06 MED ORDER — SODIUM CHLORIDE 0.9 % IV SOLN
80.0000 mg/m2 | Freq: Once | INTRAVENOUS | Status: AC
  Administered 2020-11-06: 162 mg via INTRAVENOUS
  Filled 2020-11-06: qty 27

## 2020-11-06 NOTE — Progress Notes (Signed)
Yol[facility] [name]  [phone] [phone]     ID: SIBBIE FLAMMIA DOB: 08-Dec-1960  MR#: [id number]  CXK#:[id number]  Patient Care Team: Mosie Lukes, MD as PCP - General (Family Medicine) Michel Bickers, MD as Consulting Physician (Infectious Diseases) Megan Salon, MD as Consulting Physician (Gynecology) Magrinat, Virgie Dad, MD as Consulting Physician (Oncology) Juanita Craver, MD as Consulting Physician (Gastroenterology) Stark Klein, MD as Consulting Physician (General Surgery) Mauro Kaufmann, RN as Oncology Nurse Navigator Rockwell Germany, RN as Oncology Nurse Navigator Amedeo Kinsman, [place] (Optometry) Scot Dock, NP OTHER MD:   CHIEF COMPLAINT: Estrogen receptor positive breast cancer  CURRENT TREATMENT: Adjuvant chemotherapy   INTERVAL HISTORY: [name] returns today for follow up and treatment of her estrogen receptor positive breast cancer.    She continues with weekly paclitaxel treatments, started on 08/26/2020. Today is week 61 of therapy.  Carigan is doing quite well.  She remains active.  She has no peripheral neuropathy.  She notes she feels like she is developing an ulcer under her tongue.  It is not yet visible, but she can feel it.  She has been using salt water rinses for her mouth, along with baking soda.    [name] has an open breast wound and she sees Dr. Barry Dienes regularly for this.  She says her breast wound has remained stable and she continues to dress it with wet to dry packing and covered with gauze.    REVIEW OF SYSTEMS: Review of Systems  Constitutional:  Positive for fatigue. Negative for appetite change, chills, fever and unexpected weight change.  HENT:   Negative for hearing loss, lump/mass and trouble swallowing.   Eyes:  Negative for eye problems and icterus.  Respiratory:  Negative for chest tightness, cough and shortness of breath.   Cardiovascular:  Negative for chest pain, leg swelling and palpitations.   Gastrointestinal:  Negative for abdominal distention, abdominal pain, constipation, diarrhea, nausea and vomiting.  Endocrine: Negative for hot flashes.  Genitourinary:  Negative for difficulty urinating.   Musculoskeletal:  Negative for arthralgias.  Skin:  Negative for itching and rash.  Neurological:  Negative for dizziness, extremity weakness, headaches and numbness.  Hematological:  Negative for adenopathy. Does not bruise/bleed easily.  Psychiatric/Behavioral:  Negative for depression. The patient is not nervous/anxious.      COVID 19 VACCINATION STATUS: fully vaccinated [employer]), with booster 12/2019   HISTORY OF CURRENT ILLNESS: From the original intake note:   I saw Ms. Remillard in December 2018 for evaluation of atypical lobular hyperplasia.  We discussed various strategies regarding breast cancer prevention at that time for her to consider.    More recently (around the time of the Super Bowl 2022) she found a lump in her right breast.  She underwent bilateral diagnostic mammography with tomography and right breast ultrasonography at [facility] on 04/10/2020 showing: breast density category C; palpable 3 cm mass in right breast at 12 o'clock; no enlarged adenopathy in right axilla.  Accordingly on the same day, she proceeded to biopsy of the right breast area in question. The pathology from this procedure ([id number]) showed: invasive ductal carcinoma, grade 3; ductal carcinoma in situ. Prognostic indicators significant for: estrogen receptor, 90% positive with moderate staining intensity and progesterone receptor, 0% negative. Proliferation marker Ki67 at 25%. HER2 equivocal by immunohistochemistry (2+), but negative by fluorescent in situ hybridization with a signals ratio 1.49 and number per cell 2.75.  Cancer Staging Malignant neoplasm of upper-outer quadrant  of right breast in female, estrogen receptor positive ([place]) Staging form: Breast, AJCC 8th Edition - Clinical:  Stage IIA (cT2, cN0, cM0, G3, ER+, PR+, HER2-) - Signed by Chauncey Cruel, MD on 04/18/2020  The patient's subsequent history is as detailed below.   PAST MEDICAL HISTORY: Past Medical History:  Diagnosis Date   Asthma    with respiratory infection   Cancer ([place])    breast cancer - right   Diabetes mellitus without complication ([place])    type II   Fatty liver    Fibroid uterus    Hypertension    Patient reports that she has never been diagnosied with HTN, "I take HCTZ for swelling in my feet, if needed."   Osteoarthritis 03/10/2015   PONV (postoperative nausea and vomiting)    2014   Refusal of blood transfusions as patient is Jehovah's Witness    NO BLOOD PRODUCTS   Sleep apnea     PAST SURGICAL HISTORY: Past Surgical History:  Procedure Laterality Date   BREAST CYST ASPIRATION     BREAST EXCISIONAL BIOPSY     BREAST LUMPECTOMY WITH SENTINEL LYMPH NODE BIOPSY Right 05/21/2020   Procedure: RIGHT BREAST LUMPECTOMY WITH SENTINEL LYMPH NODE BIOPSY;  Surgeon: Stark Klein, MD;  Location: [facility];  Service: General;  Laterality: Right;   CHOLECYSTECTOMY  1/11   COLONOSCOPY     DILATATION & CURRETTAGE/HYSTEROSCOPY WITH RESECTOCOPE N/A 03/13/2014   Procedure: DILATATION & CURETTAGE/HYSTEROSCOPY ;  Surgeon: Lyman Speller, MD;  Location: [facility];  Service: Gynecology;  Laterality: N/A;   PORTACATH PLACEMENT Left 05/21/2020   Procedure: INSERTION PORT-A-CATH;  Surgeon: Stark Klein, MD;  Location: [facility];  Service: General;  Laterality: Left;   RADIOACTIVE SEED GUIDED EXCISIONAL BREAST BIOPSY Left 12/30/2016   Procedure: RADIOACTIVE SEED GUIDED EXCISIONAL BREAST BIOPSY;  Surgeon: Stark Klein, MD;  Location: [facility];  Service: General;  Laterality: Left;    FAMILY HISTORY: Family History  Problem Relation Age of Onset   Asthma Mother    Diabetes Mother    Diabetes Father    Heart attack Father    Diabetes Brother    Diabetes Brother    Asthma Brother     Heart Problems Sister    Asthma Sister    Hypertension Sister    Heart disease Maternal Grandfather    Osteopenia Sister   The patient's father died from heart disease at the age of 34.  The patient's mother is 33 years old as of March 2022.  The patient has 4 brothers and 2 sisters.  She is not aware of any history of cancer in her family.   GYNECOLOGIC HISTORY:  Patient's last menstrual period was 02/21/2014. Menarche: 60 years old GX P 0 LMP age 10 Contraceptive 1 year, no complications HRT no  Hysterectomy? no BSO? 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  Her husband [name] works as a Marketing executive for [employer].  He has 2 children from a prior marriage, both living in [place].  One is traveling nurse.  The other 1 is a Administrator.  The patient is a Restaurant manager, fast food.    ADVANCED DIRECTIVES: In the absence of any documentation to the contrary, the patient's spouse is their HCPOA.  The patient made it clear at her visit 04/18/2020 that she would not accept any blood transfusion or blood products for any reason including to stave off death   HEALTH MAINTENANCE: Social History  Tobacco Use   Smoking status: Never   Smokeless tobacco: Never  Vaping Use   Vaping Use: Never used  Substance Use Topics   Alcohol use: No   Drug use: No     Colonoscopy: 10/2019 (Dr. Collene Mares), recall 2026  PAP: 03/2017, negative  Bone density: 09/2018, -0.8   Allergies  Allergen Reactions   Lisinopril Shortness Of Breath and Cough    wheezing    Current Outpatient Medications  Medication Sig Dispense Refill   albuterol (PROVENTIL HFA;VENTOLIN HFA) 108 (90 Base) MCG/ACT inhaler Inhale 1-2 puffs into the lungs every 4 (four) hours as needed for wheezing or shortness of breath. 18 g 2   atorvastatin (LIPITOR) 10 MG tablet Take 10 mg by mouth every other day.     b complex vitamins capsule Take 1 capsule by mouth every other day.     B-D ULTRA-FINE 33  LANCETS MISC Check BG 2 times/day     Carboxymethylcellul-Glycerin (REFRESH OPTIVE OP) Place 1 drop into both eyes 3 (three) times daily as needed (dry / irritated eyes).     cholecalciferol (VITAMIN D3) 25 MCG (1000 UNIT) tablet Take 1,000 Units by mouth every other day.     doxycycline (VIBRA-TABS) 100 MG tablet Take 100 mg by mouth 2 (two) times daily. (Patient not taking: No sig reported)     empagliflozin (JARDIANCE) 10 MG TABS tablet Take 1 tablet (10 mg total) by mouth daily before breakfast. 30 tablet 6   fluticasone (FLONASE) 50 MCG/ACT nasal spray Place 1 spray into both nostrils daily as needed for allergies or rhinitis.     glucose blood test strip Check BG 2 times/day.  Dx E11.65     lidocaine-prilocaine (EMLA) cream Apply to affected area once 30 g 3   loratadine (CLARITIN) 10 MG tablet Take 1 tablet (10 mg total) by mouth daily. 90 tablet 0   milk thistle 175 MG tablet Take 175 mg by mouth every other day.     Omega-3 Fatty Acids (OMEGA 3 PO) Take 1 capsule by mouth every other day.     omeprazole (PRILOSEC) 40 MG capsule Take 40 mg by mouth daily as needed (acid reflux).     prochlorperazine (COMPAZINE) 5 MG tablet Take 1 tablet (5 mg total) by mouth every 6 (six) hours as needed for nausea or vomiting. 30 tablet 0   valACYclovir (VALTREX) 500 MG tablet Take 1 tablet (500 mg total) by mouth daily. 60 tablet 1   vitamin C (ASCORBIC ACID) 500 MG tablet Take 500 mg by mouth every other day.     No current facility-administered medications for this visit.   Facility-Administered Medications Ordered in Other Visits  Medication Dose Route Frequency Provider Last Rate Last Admin   sodium chloride flush (NS) 0.9 % injection 10 mL  10 mL Intracatheter PRN Magrinat, Virgie Dad, MD       SUPPLEMENTS: Aside from the medications listed above, the patient takes a teaspoon of olive oil daily, 1000 mg of vitamin C every other day, sunflower lecithin 600 mg every other day, vitamin D3 5000 mg  every other day, magnesium 200 mg every other day, milk thistle 150 mg every other day, a B complex vitamin (B6 and B12) every other day, as well as Osteo Bi-Flex weekly and fluticasone as needed  OBJECTIVE: African-American woman in no acute distress  Vitals:   11/06/20 0926  BP: (!) 143/86  Pulse: 85  Resp: 18  Temp: 98.6 F (37 C)  SpO2: 100%  Body mass index is 32.33 kg/m.   Wt Readings from Last 3 Encounters:  11/06/20 182 lb 8 oz (82.8 kg)  10/30/20 182 lb 1.6 oz (82.6 kg)  10/23/20 184 lb 6.4 oz (83.6 kg)     ECOG FS:1 - Symptomatic but completely ambulatory GENERAL: Patient is a well appearing female in no acute distress HEENT:  Sclerae anicteric.  Oropharynx clear and moist. No ulcerations or evidence of oropharyngeal candidiasis. Neck is supple.  NODES:  No cervical, supraclavicular, or axillary lymphadenopathy palpated.  BREAST EXAM:  Deferred today LUNGS:  Clear to auscultation bilaterally.  No wheezes or rhonchi. HEART:  Regular rate and rhythm. No murmur appreciated. ABDOMEN:  Soft, nontender.  Positive, normoactive bowel sounds. No organomegaly palpated. MSK:  No focal spinal tenderness to palpation. Full range of motion bilaterally in the upper extremities. EXTREMITIES:  No peripheral edema.   SKIN:  Clear with no obvious rashes or skin changes. No nail dyscrasia. NEURO:  Nonfocal. Well oriented.  Appropriate affect.   LAB RESULTS:  CMP     Component Value Date/Time   NA 139 11/06/2020 0828   NA 138 11/04/2017 0000   K 3.9 11/06/2020 0828   CL 104 11/06/2020 0828   CO2 25 11/06/2020 0828   GLUCOSE 228 (H) 11/06/2020 0828   BUN 15 11/06/2020 0828   BUN 14 11/04/2017 0000   CREATININE 0.71 11/06/2020 0828   CREATININE 0.81 04/18/2020 1527   CREATININE 0.60 09/07/2014 0950   CALCIUM 10.3 11/06/2020 0828   CALCIUM 9.7 10/02/2020 1030   PROT 6.6 11/06/2020 0828   ALBUMIN 3.5 11/06/2020 0828   AST 18 11/06/2020 0828   AST 26 04/18/2020 1527   ALT  21 11/06/2020 0828   ALT 37 04/18/2020 1527   ALKPHOS 144 (H) 11/06/2020 0828   BILITOT 0.3 11/06/2020 0828   BILITOT 0.3 04/18/2020 1527   GFRNONAA >60 11/06/2020 0828   GFRNONAA >60 04/18/2020 1527   GFRAA >60 05/15/2019 1803    No results found for: TOTALPROTELP, ALBUMINELP, A1GS, A2GS, BETS, BETA2SER, GAMS, MSPIKE, SPEI  Lab Results  Component Value Date   WBC 4.7 11/06/2020   NEUTROABS 2.9 11/06/2020   HGB 10.7 (L) 11/06/2020   HCT 33.3 (L) 11/06/2020   MCV 101.2 (H) 11/06/2020   PLT 293 11/06/2020    No results found for: LABCA2  No components found for: KWIOXB353  No results for input(s): INR in the last 168 hours.  No results found for: LABCA2  No results found for: GDJ242  No results found for: AST419  No results found for: QQI297  No results found for: CA2729  No components found for: HGQUANT  No results found for: CEA1 / No results found for: CEA1   No results found for: AFPTUMOR  No results found for: CHROMOGRNA  No results found for: KPAFRELGTCHN, LAMBDASER, KAPLAMBRATIO (kappa/lambda light chains)  No results found for: HGBA, HGBA2QUANT, HGBFQUANT, HGBSQUAN (Hemoglobinopathy evaluation)   No results found for: LDH  Lab Results  Component Value Date   IRON 38 (L) 10/16/2009   IRONPCTSAT 10.2 (L) 10/16/2009   (Iron and TIBC)  Lab Results  Component Value Date   FERRITIN 8.9 (L) 10/16/2009    Urinalysis    Component Value Date/Time   COLORURINE YELLOW 03/11/2009 1100   APPEARANCEUR CLEAR 03/11/2009 1100   LABSPEC 1.017 03/11/2009 1100   PHURINE 7.0 03/11/2009 1100   GLUCOSEU NEGATIVE 03/11/2009 1100   [place] (A) 03/11/2009 1100   BILIRUBINUR N 05/21/2015 1614  KETONESUR NEGATIVE 03/11/2009 1100   PROTEINUR N 05/21/2015 1614   PROTEINUR NEGATIVE 03/11/2009 1100   UROBILINOGEN negative 05/21/2015 1614   UROBILINOGEN 0.2 03/11/2009 1100   NITRITE N 05/21/2015 1614   NITRITE NEGATIVE 03/11/2009 1100   LEUKOCYTESUR  Negative 05/21/2015 1614    STUDIES: No results found.   ELIGIBLE FOR AVAILABLE RESEARCH PROTOCOL: no  ASSESSMENT: 60 y.o. [place] woman status post right breast upper outer quadrant biopsy 04/10/2020 for a clinical T2N0, stage IIA invasive ductal carcinoma, grade 3, estrogen receptor positive, progesterone receptor and HER-2 negative, with an MIB-1 of 25%.  (1) Oncotype obtained from the original biopsy shows a score of 48, predicting a recurrence rate outside the breast within 9 years of 28% if the only systemic therapy is antiestrogen for 5 years.  It also predicts a greater than 15% benefit from chemotherapy.  (2) right lumpectomy and sentinel lymph node dissection 05/21/2020 shows a pT2 pN0, stage IIB invasive ductal carcinoma, grade 3, with negative margins.  (3) adjuvant chemotherapy to consist of cyclophosphamide and doxorubicin in dose dense fashion x4 starting 06/18/2020, with Neulasta day 3; to be followed by paclitaxel weekly x12 started 08/28/2020.  (a) echo 05/13/2020 shows an ejection fraction in the 55-60% range  (4) adjuvant radiation  (5) antiestrogens.   PLAN: Telina is here for follow-up and evaluation of her breast cancer prior to receiving weekly Paclitaxel chemotherapy.  Overall, she continues to tolerate her treatment well.  Her labs are stable and I reviewed these with her in detail.  She will proceed with her 11th cycle of Paclitaxel today.    Matika and I discussed her early oral ulcer.  I recommended that she take Valtrex $RemoveBefo'500mg'YFptoAfXlrl$  PO BID.  I sent in magic mouthwash to her pharmacy for her to use.    I recommended she continue with her activity level, and we continue to monitor her closely for peripheral neuropathy which she has not yet developed.    Tarita will return in one week for labs, f/u, and her final Paclitaxel chemotherapy.  She knows to call for any questions that may arise between now and her next appointment.  We are happy to see her sooner if  needed.   Total encounter time 20 minutes.Wilber Bihari, NP 11/06/20 9:40 AM Medical Oncology and Hematology [facility] [place], [place] [address] Tel. [phone]    Fax. [phone]   *Total Encounter Time as defined by the Centers for Medicare and Medicaid Services includes, in addition to the face-to-face time of a patient visit (documented in the note above) non-face-to-face time: obtaining and reviewing outside history, ordering and reviewing medications, tests or procedures, care coordination (communications with other health care professionals or caregivers) and documentation in the medical record.

## 2020-11-06 NOTE — Patient Instructions (Signed)
[facility] MEDICAL [facility]COLOGY   Discharge Instructions: Thank you for choosing [facility] to provide your oncology and hematology care.   If you have a lab appointment with the Cancer Center, please go directly to the Cancer Center and check in at the registration area.   Wear comfortable clothing and clothing appropriate for easy access to any Portacath or PICC line.   We strive to give you quality time with your provider. You may need to reschedule your appointment if you arrive late (15 or more minutes).  Arriving late affects you and other patients whose appointments are after yours.  Also, if you miss three or more appointments without notifying the office, you may be dismissed from the clinic at the provider's discretion.      For prescription refill requests, have your pharmacy contact our office and allow 72 hours for refills to be completed.    Today you received the following chemotherapy and/or immunotherapy agents: paclitaxel.      To help prevent nausea and vomiting after your treatment, we encourage you to take your nausea medication as directed.  BELOW ARE SYMPTOMS THAT SHOULD BE REPORTED IMMEDIATELY: *FEVER GREATER THAN 100.4 F (38 C) OR HIGHER *CHILLS OR SWEATING *NAUSEA AND VOMITING THAT IS NOT CONTROLLED WITH YOUR NAUSEA MEDICATION *UNUSUAL SHORTNESS OF BREATH *UNUSUAL BRUISING OR BLEEDING *URINARY PROBLEMS (pain or burning when urinating, or frequent urination) *BOWEL PROBLEMS (unusual diarrhea, constipation, pain near the anus) TENDERNESS IN MOUTH AND THROAT WITH OR WITHOUT PRESENCE OF ULCERS (sore throat, sores in mouth, or a toothache) UNUSUAL RASH, SWELLING OR PAIN  UNUSUAL VAGINAL DISCHARGE OR ITCHING   Items with * indicate a potential emergency and should be followed up as soon as possible or go to the Emergency Department if any problems should occur.  Please show the CHEMOTHERAPY ALERT CARD or IMMUNOTHERAPY ALERT CARD at check-in  to the Emergency Department and triage nurse.  Should you have questions after your visit or need to cancel or reschedule your appointment, please contact [facility] MEDICAL [facility]COL[facility]  Dept: [phone]  and follow the prompts.  Office hours are 8:00 a.m. to 4:30 p.m. Monday - Friday. Please note that voicemails left after 4:00 p.m. may not be returned until the following business day.  We are closed weekends and major holidays. You have access to a nurse at all times for urgent questions. Please call the main number to the clinic Dept: [phone] and follow the prompts.   For any non-urgent questions, you may also contact your provider using MyChart. We now offer e-Visits for anyone 18 and older to request care online for non-urgent symptoms. For details visit [email or web address].   Also download the MyChart app! Go to the app store, search "MyChart", open the app, select , and log in with your MyChart username and password.  Due to Covid, a mask is required upon entering the hospital/clinic. If you do not have a mask, one will be given to you upon arrival. For doctor visits, patients may have 1 support person aged 18 or older with them. For treatment visits, patients cannot have anyone with them due to current Covid guidelines and our immunocompromised population.   

## 2020-11-11 ENCOUNTER — Encounter: Payer: Self-pay | Admitting: Oncology

## 2020-11-13 ENCOUNTER — Other Ambulatory Visit: Payer: Self-pay

## 2020-11-13 ENCOUNTER — Inpatient Hospital Stay (HOSPITAL_BASED_OUTPATIENT_CLINIC_OR_DEPARTMENT_OTHER): Payer: 59 | Admitting: Adult Health

## 2020-11-13 ENCOUNTER — Encounter: Payer: Self-pay | Admitting: Adult Health

## 2020-11-13 ENCOUNTER — Inpatient Hospital Stay: Payer: 59

## 2020-11-13 ENCOUNTER — Encounter: Payer: Self-pay | Admitting: *Deleted

## 2020-11-13 VITALS — BP 136/84 | HR 86 | Temp 98.4°F | Resp 18 | Ht 63.0 in | Wt 180.5 lb

## 2020-11-13 DIAGNOSIS — C50411 Malignant neoplasm of upper-outer quadrant of right female breast: Secondary | ICD-10-CM | POA: Diagnosis not present

## 2020-11-13 DIAGNOSIS — Z170 Estrogen receptor positive status [ER+]: Secondary | ICD-10-CM

## 2020-11-13 DIAGNOSIS — Z95828 Presence of other vascular implants and grafts: Secondary | ICD-10-CM

## 2020-11-13 DIAGNOSIS — Z5111 Encounter for antineoplastic chemotherapy: Secondary | ICD-10-CM | POA: Diagnosis not present

## 2020-11-13 LAB — COMPREHENSIVE METABOLIC PANEL
ALT: 25 U/L (ref 0–44)
AST: 23 U/L (ref 15–41)
Albumin: 3.7 g/dL (ref 3.5–5.0)
Alkaline Phosphatase: 130 U/L — ABNORMAL HIGH (ref 38–126)
Anion gap: 10 (ref 5–15)
BUN: 14 mg/dL (ref 8–23)
CO2: 24 mmol/L (ref 22–32)
Calcium: 10.6 mg/dL — ABNORMAL HIGH (ref 8.9–10.3)
Chloride: 106 mmol/L (ref 98–111)
Creatinine, Ser: 0.69 mg/dL (ref 0.44–1.00)
GFR, Estimated: >60 mL/min (ref >60)
Glucose, Bld: 140 mg/dL — ABNORMAL HIGH (ref 70–99)
Potassium: 4.3 mmol/L (ref 3.5–5.1)
Sodium: 140 mmol/L (ref 135–145)
Total Bilirubin: 0.3 mg/dL (ref 0.3–1.2)
Total Protein: 7 g/dL (ref 6.5–8.1)

## 2020-11-13 LAB — CBC WITH DIFFERENTIAL/PLATELET
Abs Immature Granulocytes: 0.02 10*3/uL (ref 0.00–0.07)
Basophils Absolute: 0 10*3/uL (ref 0.0–0.1)
Basophils Relative: 1 %
Eosinophils Absolute: 0.2 10*3/uL (ref 0.0–0.5)
Eosinophils Relative: 4 %
HCT: 34.6 % — ABNORMAL LOW (ref 36.0–46.0)
Hemoglobin: 11.2 g/dL — ABNORMAL LOW (ref 12.0–15.0)
Immature Granulocytes: 1 %
Lymphocytes Relative: 26 %
Lymphs Abs: 1.1 10*3/uL (ref 0.7–4.0)
MCH: 32.7 pg (ref 26.0–34.0)
MCHC: 32.4 g/dL (ref 30.0–36.0)
MCV: 100.9 fL — ABNORMAL HIGH (ref 80.0–100.0)
Monocytes Absolute: 0.5 10*3/uL (ref 0.1–1.0)
Monocytes Relative: 10 %
Neutro Abs: 2.6 10*3/uL (ref 1.7–7.7)
Neutrophils Relative %: 58 %
Platelets: 281 10*3/uL (ref 150–400)
RBC: 3.43 MIL/uL — ABNORMAL LOW (ref 3.87–5.11)
RDW: 15.1 % (ref 11.5–15.5)
WBC: 4.4 10*3/uL (ref 4.0–10.5)
nRBC: 0 % (ref 0.0–0.2)

## 2020-11-13 MED ORDER — FAMOTIDINE 20 MG IN NS 100 ML IVPB
20.0000 mg | Freq: Once | INTRAVENOUS | Status: AC
  Administered 2020-11-13: 20 mg via INTRAVENOUS
  Filled 2020-11-13: qty 100

## 2020-11-13 MED ORDER — SODIUM CHLORIDE 0.9% FLUSH
10.0000 mL | INTRAVENOUS | Status: DC | PRN
  Administered 2020-11-13: 10 mL

## 2020-11-13 MED ORDER — HEPARIN SOD (PORK) LOCK FLUSH 100 UNIT/ML IV SOLN
500.0000 [IU] | Freq: Once | INTRAVENOUS | Status: AC | PRN
  Administered 2020-11-13: 500 [IU]

## 2020-11-13 MED ORDER — DEXAMETHASONE SODIUM PHOSPHATE 10 MG/ML IJ SOLN
4.0000 mg | Freq: Once | INTRAMUSCULAR | Status: AC
  Administered 2020-11-13: 4 mg via INTRAVENOUS
  Filled 2020-11-13: qty 1

## 2020-11-13 MED ORDER — SODIUM CHLORIDE 0.9 % IV SOLN
80.0000 mg/m2 | Freq: Once | INTRAVENOUS | Status: AC
  Administered 2020-11-13: 162 mg via INTRAVENOUS
  Filled 2020-11-13: qty 27

## 2020-11-13 MED ORDER — DIPHENHYDRAMINE HCL 50 MG/ML IJ SOLN
25.0000 mg | Freq: Once | INTRAMUSCULAR | Status: AC
  Administered 2020-11-13: 25 mg via INTRAVENOUS
  Filled 2020-11-13: qty 1

## 2020-11-13 MED ORDER — SODIUM CHLORIDE 0.9 % IV SOLN: Freq: Once | INTRAVENOUS | Status: AC

## 2020-11-13 NOTE — Patient Instructions (Signed)
[facility]  Discharge Instructions: Thank you for choosing [facility] to provide your oncology and hematology care.   If you have a lab appointment with the [facility], please go directly to the [facility] and check in at the registration area.   Wear comfortable clothing and clothing appropriate for easy access to any Portacath or PICC line.   We strive to give you quality time with your provider. You may need to reschedule your appointment if you arrive late (15 or more minutes).  Arriving late affects you and other patients whose appointments are after yours.  Also, if you miss three or more appointments without notifying the office, you may be dismissed from the clinic at the provider's discretion.      For prescription refill requests, have your pharmacy contact our office and allow 72 hours for refills to be completed.    Today you received the following chemotherapy and/or immunotherapy agents:  Paclitaxel      To help prevent nausea and vomiting after your treatment, we encourage you to take your nausea medication as directed.  BELOW ARE SYMPTOMS THAT SHOULD BE REPORTED IMMEDIATELY: *FEVER GREATER THAN 100.4 F (38 C) OR HIGHER *CHILLS OR SWEATING *NAUSEA AND VOMITING THAT IS NOT CONTROLLED WITH YOUR NAUSEA MEDICATION *UNUSUAL SHORTNESS OF BREATH *UNUSUAL BRUISING OR BLEEDING *URINARY PROBLEMS (pain or burning when urinating, or frequent urination) *BOWEL PROBLEMS (unusual diarrhea, constipation, pain near the anus) TENDERNESS IN MOUTH AND THROAT WITH OR WITHOUT PRESENCE OF ULCERS (sore throat, sores in mouth, or a toothache) UNUSUAL RASH, SWELLING OR PAIN  UNUSUAL VAGINAL DISCHARGE OR ITCHING   Items with * indicate a potential emergency and should be followed up as soon as possible or go to the Emergency Department if any problems should occur.  Please show the CHEMOTHERAPY ALERT CARD or IMMUNOTHERAPY ALERT CARD at check-in  to the Emergency Department and triage nurse.  Should you have questions after your visit or need to cancel or reschedule your appointment, please contact [facility]  Dept: [phone]  and follow the prompts.  Office hours are 8:00 a.m. to 4:30 p.m. Monday - Friday. Please note that voicemails left after 4:00 p.m. may not be returned until the following business day.  We are closed weekends and major holidays. You have access to a nurse at all times for urgent questions. Please call the main number to the clinic Dept: [phone] and follow the prompts.   For any non-urgent questions, you may also contact your provider using MyChart. We now offer e-Visits for anyone 17 and older to request care online for non-urgent symptoms. For details visit [email or web address].   Also download the MyChart app! Go to the app store, search "MyChart", open the app, select [facility], and log in with your MyChart username and password.  Due to Covid, a mask is required upon entering the hospital/clinic. If you do not have a mask, one will be given to you upon arrival. For doctor visits, patients may have 1 support person aged 52 or older with them. For treatment visits, patients cannot have anyone with them due to current Covid guidelines and our immunocompromised population.

## 2020-11-13 NOTE — Progress Notes (Signed)
[place]  [phone] [phone]     ID: CHIRSTY ARMISTEAD DOB: [birth year]  MR#: [id number]  DUK#:[id number]  Patient Care Team: Mosie Lukes, MD as PCP - General (Family Medicine) Michel Bickers, MD as Consulting Physician (Infectious Diseases) Megan Salon, MD as Consulting Physician (Gynecology) Magrinat, Virgie Dad, MD as Consulting Physician (Oncology) Juanita Craver, MD as Consulting Physician (Gastroenterology) Stark Klein, MD as Consulting Physician (General Surgery) Mauro Kaufmann, RN as Oncology Nurse Navigator Rockwell Germany, RN as Oncology Nurse Navigator Amedeo Kinsman, [place] (Optometry) Scot Dock, NP OTHER MD:   CHIEF COMPLAINT: Estrogen receptor positive breast cancer  CURRENT TREATMENT: completing Adjuvant chemotherapy, to receive adjuvant radiation   INTERVAL HISTORY: Nakoma returns today for follow up and treatment of her estrogen receptor positive breast cancer.    She continues with weekly paclitaxel treatments, started on 08/26/2020. Today is week 12, and she will complete her treatment today.    Hermenia continues to see Dr. Barry Dienes for the delayed healing of    REVIEW OF SYSTEMS: Review of Systems  Constitutional:  Positive for fatigue. Negative for appetite change, chills, fever and unexpected weight change.  HENT:   Negative for hearing loss, lump/mass and trouble swallowing.   Eyes:  Negative for eye problems and icterus.  Respiratory:  Negative for chest tightness, cough and shortness of breath.   Cardiovascular:  Negative for chest pain, leg swelling and palpitations.  Gastrointestinal:  Negative for abdominal distention, abdominal pain, constipation, diarrhea, nausea and vomiting.  Endocrine: Negative for hot flashes.  Genitourinary:  Negative for difficulty urinating.   Musculoskeletal:  Negative for arthralgias.  Skin:  Negative for itching and rash.  Neurological:  Negative for dizziness, extremity  weakness, headaches and numbness.  Hematological:  Negative for adenopathy. Does not bruise/bleed easily.  Psychiatric/Behavioral:  Negative for depression. The patient is not nervous/anxious.       COVID 19 VACCINATION STATUS: fully vaccinated [employer]), with booster 12/2019   HISTORY OF CURRENT ILLNESS: From the original intake note:   I saw Ms. Santaella in December 2018 for evaluation of atypical lobular hyperplasia.  We discussed various strategies regarding breast cancer prevention at that time for her to consider.    More recently (around the time of the Super Bowl 2022) she found a lump in her right breast.  She underwent bilateral diagnostic mammography with tomography and right breast ultrasonography at [facility] on 04/10/2020 showing: breast density category C; palpable 3 cm mass in right breast at 12 o'clock; no enlarged adenopathy in right axilla.  Accordingly on the same day, she proceeded to biopsy of the right breast area in question. The pathology from this procedure ([id number]) showed: invasive ductal carcinoma, grade 3; ductal carcinoma in situ. Prognostic indicators significant for: estrogen receptor, 90% positive with moderate staining intensity and progesterone receptor, 0% negative. Proliferation marker Ki67 at 25%. HER2 equivocal by immunohistochemistry (2+), but negative by fluorescent in situ hybridization with a signals ratio 1.49 and number per cell 2.75.  Cancer Staging Malignant neoplasm of upper-outer quadrant of right breast in female, estrogen receptor positive ([place]) Staging form: Breast, AJCC 8th Edition - Clinical: Stage IIA (cT2, cN0, cM0, G3, ER+, PR+, HER2-) - Signed by Chauncey Cruel, MD on 04/18/2020  The patient's subsequent history is as detailed below.   PAST MEDICAL HISTORY: Past Medical History:  Diagnosis Date   Asthma    with respiratory infection   Cancer ([place])    breast  cancer - right   Diabetes mellitus without complication  (HCC)    type II   Fatty liver    Fibroid uterus    Hypertension    Patient reports that she has never been diagnosied with HTN, "I take HCTZ for swelling in my feet, if needed."   Osteoarthritis 03/10/2015   PONV (postoperative nausea and vomiting)    2014   Refusal of blood transfusions as patient is Jehovah's Witness    NO BLOOD PRODUCTS   Sleep apnea     PAST SURGICAL HISTORY: Past Surgical History:  Procedure Laterality Date   BREAST CYST ASPIRATION     BREAST EXCISIONAL BIOPSY     BREAST LUMPECTOMY WITH SENTINEL LYMPH NODE BIOPSY Right 05/21/2020   Procedure: RIGHT BREAST LUMPECTOMY WITH SENTINEL LYMPH NODE BIOPSY;  Surgeon: Almond Lint, MD;  Location: [facility] OR;  Service: General;  Laterality: Right;   CHOLECYSTECTOMY  1/11   COLONOSCOPY     DILATATION & CURRETTAGE/HYSTEROSCOPY WITH RESECTOCOPE N/A 03/13/2014   Procedure: DILATATION & CURETTAGE/HYSTEROSCOPY ;  Surgeon: Annamaria Boots, MD;  Location: [facility];  Service: Gynecology;  Laterality: N/A;   PORTACATH PLACEMENT Left 05/21/2020   Procedure: INSERTION PORT-A-CATH;  Surgeon: Almond Lint, MD;  Location: [facility] OR;  Service: General;  Laterality: Left;   RADIOACTIVE SEED GUIDED EXCISIONAL BREAST BIOPSY Left 12/30/2016   Procedure: RADIOACTIVE SEED GUIDED EXCISIONAL BREAST BIOPSY;  Surgeon: Almond Lint, MD;  Location: [facility];  Service: General;  Laterality: Left;    FAMILY HISTORY: Family History  Problem Relation Age of Onset   Asthma Mother    Diabetes Mother    Diabetes Father    Heart attack Father    Diabetes Brother    Diabetes Brother    Asthma Brother    Heart Problems Sister    Asthma Sister    Hypertension Sister    Heart disease Maternal Grandfather    Osteopenia Sister   The patient's father died from heart disease at the age of 96.  The patient's mother is 96 years old as of March 2022.  The patient has 4 brothers and 2 sisters.  She is not aware of any history of cancer in her  family.   GYNECOLOGIC HISTORY:  Patient's last menstrual period was 02/21/2014. Menarche: 60 years old GX P 0 LMP age 82 Contraceptive 1 year, no complications HRT no  Hysterectomy? no BSO? 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  Her husband Zollie Beckers works as a Systems developer for [employer].  He has 2 children from a prior marriage, both living in [place].  One is traveling nurse.  The other 1 is a Naval architect.  The patient is a Scientist, product/process development.    ADVANCED DIRECTIVES: In the absence of any documentation to the contrary, the patient's spouse is their HCPOA.  The patient made it clear at her visit 04/18/2020 that she would not accept any blood transfusion or blood products for any reason including to stave off death   HEALTH MAINTENANCE: Social History   Tobacco Use   Smoking status: Never   Smokeless tobacco: Never  Vaping Use   Vaping Use: Never used  Substance Use Topics   Alcohol use: No   Drug use: No     Colonoscopy: 10/2019 (Dr. Loreta Ave), recall 2026  PAP: 03/2017, negative  Bone density: 09/2018, -0.8   Allergies  Allergen Reactions   Lisinopril Shortness Of Breath and Cough  wheezing    Current Outpatient Medications  Medication Sig Dispense Refill   albuterol (PROVENTIL HFA;VENTOLIN HFA) 108 (90 Base) MCG/ACT inhaler Inhale 1-2 puffs into the lungs every 4 (four) hours as needed for wheezing or shortness of breath. 18 g 2   atorvastatin (LIPITOR) 10 MG tablet Take 10 mg by mouth every other day.     b complex vitamins capsule Take 1 capsule by mouth every other day.     B-D ULTRA-FINE 33 LANCETS MISC Check BG 2 times/day     Carboxymethylcellul-Glycerin (REFRESH OPTIVE OP) Place 1 drop into both eyes 3 (three) times daily as needed (dry / irritated eyes).     cholecalciferol (VITAMIN D3) 25 MCG (1000 UNIT) tablet Take 1,000 Units by mouth every other day.     doxycycline (VIBRA-TABS) 100 MG tablet Take 100 mg by mouth 2  (two) times daily. (Patient not taking: No sig reported)     empagliflozin (JARDIANCE) 10 MG TABS tablet Take 1 tablet (10 mg total) by mouth daily before breakfast. 30 tablet 6   fluticasone (FLONASE) 50 MCG/ACT nasal spray Place 1 spray into both nostrils daily as needed for allergies or rhinitis.     glucose blood test strip Check BG 2 times/day.  Dx E11.65     lidocaine-prilocaine (EMLA) cream Apply to affected area once 30 g 3   loratadine (CLARITIN) 10 MG tablet Take 1 tablet (10 mg total) by mouth daily. 90 tablet 0   milk thistle 175 MG tablet Take 175 mg by mouth every other day.     Omega-3 Fatty Acids (OMEGA 3 PO) Take 1 capsule by mouth every other day.     omeprazole (PRILOSEC) 40 MG capsule Take 40 mg by mouth daily as needed (acid reflux).     prochlorperazine (COMPAZINE) 5 MG tablet Take 1 tablet (5 mg total) by mouth every 6 (six) hours as needed for nausea or vomiting. 30 tablet 0   valACYclovir (VALTREX) 500 MG tablet Take 1 tablet (500 mg total) by mouth daily. 60 tablet 1   vitamin C (ASCORBIC ACID) 500 MG tablet Take 500 mg by mouth every other day.     No current facility-administered medications for this visit.   Facility-Administered Medications Ordered in Other Visits  Medication Dose Route Frequency Provider Last Rate Last Admin   sodium chloride flush (NS) 0.9 % injection 10 mL  10 mL Intracatheter PRN Magrinat, Virgie Dad, MD       SUPPLEMENTS: Aside from the medications listed above, the patient takes a teaspoon of olive oil daily, 1000 mg of vitamin C every other day, sunflower lecithin 600 mg every other day, vitamin D3 5000 mg every other day, magnesium 200 mg every other day, milk thistle 150 mg every other day, a B complex vitamin (B6 and B12) every other day, as well as Osteo Bi-Flex weekly and fluticasone as needed  OBJECTIVE: African-American woman in no acute distress  Vitals:   11/06/20 0926  BP: (!) 143/86  Pulse: 85  Resp: 18  Temp: 98.6 F (37 C)   SpO2: 100%      Body mass index is 32.33 kg/m.   Wt Readings from Last 3 Encounters:  11/06/20 182 lb 8 oz (82.8 kg)  10/30/20 182 lb 1.6 oz (82.6 kg)  10/23/20 184 lb 6.4 oz (83.6 kg)     ECOG FS:1 - Symptomatic but completely ambulatory GENERAL: Patient is a well appearing female in no acute distress HEENT:  Sclerae anicteric.  Oropharynx  clear and moist. No ulcerations or evidence of oropharyngeal candidiasis. Neck is supple.  NODES:  No cervical, supraclavicular, or axillary lymphadenopathy palpated.  BREAST EXAM:  Deferred today LUNGS:  Clear to auscultation bilaterally.  No wheezes or rhonchi. HEART:  Regular rate and rhythm. No murmur appreciated. ABDOMEN:  Soft, nontender.  Positive, normoactive bowel sounds. No organomegaly palpated. MSK:  No focal spinal tenderness to palpation. Full range of motion bilaterally in the upper extremities. EXTREMITIES:  No peripheral edema.   SKIN:  Clear with no obvious rashes or skin changes. No nail dyscrasia. NEURO:  Nonfocal. Well oriented.  Appropriate affect.   LAB RESULTS:  CMP     Component Value Date/Time   NA 139 11/06/2020 0828   NA 138 11/04/2017 0000   K 3.9 11/06/2020 0828   CL 104 11/06/2020 0828   CO2 25 11/06/2020 0828   GLUCOSE 228 (H) 11/06/2020 0828   BUN 15 11/06/2020 0828   BUN 14 11/04/2017 0000   CREATININE 0.71 11/06/2020 0828   CREATININE 0.81 04/18/2020 1527   CREATININE 0.60 09/07/2014 0950   CALCIUM 10.3 11/06/2020 0828   CALCIUM 9.7 10/02/2020 1030   PROT 6.6 11/06/2020 0828   ALBUMIN 3.5 11/06/2020 0828   AST 18 11/06/2020 0828   AST 26 04/18/2020 1527   ALT 21 11/06/2020 0828   ALT 37 04/18/2020 1527   ALKPHOS 144 (H) 11/06/2020 0828   BILITOT 0.3 11/06/2020 0828   BILITOT 0.3 04/18/2020 1527   GFRNONAA >60 11/06/2020 0828   GFRNONAA >60 04/18/2020 1527   GFRAA >60 05/15/2019 1803    No results found for: TOTALPROTELP, ALBUMINELP, A1GS, A2GS, BETS, BETA2SER, GAMS, MSPIKE, SPEI  Lab  Results  Component Value Date   WBC 4.7 11/06/2020   NEUTROABS 2.9 11/06/2020   HGB 10.7 (L) 11/06/2020   HCT 33.3 (L) 11/06/2020   MCV 101.2 (H) 11/06/2020   PLT 293 11/06/2020    No results found for: LABCA2  No components found for: [id number]  No results for input(s): INR in the last 168 hours.  No results found for: LABCA2  No results found for: WFU932  No results found for: TFT732  No results found for: KGU542  No results found for: CA2729  No components found for: HGQUANT  No results found for: CEA1 / No results found for: CEA1   No results found for: AFPTUMOR  No results found for: CHROMOGRNA  No results found for: KPAFRELGTCHN, LAMBDASER, KAPLAMBRATIO (kappa/lambda light chains)  No results found for: HGBA, HGBA2QUANT, HGBFQUANT, HGBSQUAN (Hemoglobinopathy evaluation)   No results found for: LDH  Lab Results  Component Value Date   IRON 38 (L) 10/16/2009   IRONPCTSAT 10.2 (L) 10/16/2009   (Iron and TIBC)  Lab Results  Component Value Date   FERRITIN 8.9 (L) 10/16/2009    Urinalysis    Component Value Date/Time   COLORURINE YELLOW 03/11/2009 1100   APPEARANCEUR CLEAR 03/11/2009 1100   LABSPEC 1.017 03/11/2009 1100   PHURINE 7.0 03/11/2009 1100   GLUCOSEU NEGATIVE 03/11/2009 1100   HGBUR LARGE (A) 03/11/2009 1100   BILIRUBINUR N 05/21/2015 1614   KETONESUR NEGATIVE 03/11/2009 1100   PROTEINUR N 05/21/2015 1614   PROTEINUR NEGATIVE 03/11/2009 1100   UROBILINOGEN negative 05/21/2015 1614   UROBILINOGEN 0.2 03/11/2009 1100   NITRITE N 05/21/2015 1614   NITRITE NEGATIVE 03/11/2009 1100   LEUKOCYTESUR Negative 05/21/2015 1614    STUDIES: No results found.   ELIGIBLE FOR AVAILABLE RESEARCH PROTOCOL: no  ASSESSMENT: 60 y.o.  [place] woman status post right breast upper outer quadrant biopsy 04/10/2020 for a clinical T2N0, stage IIA invasive ductal carcinoma, grade 3, estrogen receptor positive, progesterone receptor and HER-2 negative,  with an MIB-1 of 25%.  (1) Oncotype obtained from the original biopsy shows a score of 48, predicting a recurrence rate outside the breast within 9 years of 28% if the only systemic therapy is antiestrogen for 5 years.  It also predicts a greater than 15% benefit from chemotherapy.  (2) right lumpectomy and sentinel lymph node dissection 05/21/2020 shows a pT2 pN0, stage IIB invasive ductal carcinoma, grade 3, with negative margins.  (3) adjuvant chemotherapy to consist of cyclophosphamide and doxorubicin in dose dense fashion x4 starting 06/18/2020, with Neulasta day 3; to be followed by paclitaxel weekly x12 started 08/28/2020.  (a) echo 05/13/2020 shows an ejection fraction in the 55-60% range  (4) adjuvant radiation  (5) antiestrogens.   PLAN: Dezeray is here to finish chemotherapy today.  She has tolerated this quite well.  Her labs remain stable and I reviewed those with her.   The fatigue that Shaeleigh is experiencing is normal.  I recommended that she continue with her exercise, and it should slowly improve.    She is now ready for her radiation oncology consult.  This may be delayed since her surgical wound is open.  We will see what radiation oncology thinks about it.  She continues to f/u with Dr. Barry Dienes about this and sees her again on Monday, 11/18/2020.    We reviewed her overall treatment plan including her antiestrogen therapy and her survivorship care plan visit.  She will return in 4 weeks for f/u so we can review how her breast wound is doing and make any adjustments in her schedule.   She knows to call for any questions that may arise between now and her next appointment.  We are happy to see her sooner if needed.   Total encounter time 20 minutes.Wilber Bihari, NP 11/06/20 9:40 AM Medical Oncology and Hematology [facility] [place], [place] [address] Tel. [phone]    Fax. [phone]   *Total Encounter Time as defined by the Centers  for Medicare and Medicaid Services includes, in addition to the face-to-face time of a patient visit (documented in the note above) non-face-to-face time: obtaining and reviewing outside history, ordering and reviewing medications, tests or procedures, care coordination (communications with other health care professionals or caregivers) and documentation in the medical record.

## 2020-11-14 ENCOUNTER — Encounter: Payer: Self-pay | Admitting: Adult Health

## 2020-11-14 ENCOUNTER — Telehealth: Payer: Self-pay | Admitting: Oncology

## 2020-11-14 NOTE — Telephone Encounter (Signed)
Scheduled appointment per 09/28 los. Patient is aware.

## 2020-11-18 ENCOUNTER — Other Ambulatory Visit: Payer: Self-pay | Admitting: General Surgery

## 2020-11-20 ENCOUNTER — Other Ambulatory Visit: Payer: 59

## 2020-11-20 ENCOUNTER — Encounter: Payer: Self-pay | Admitting: *Deleted

## 2020-11-20 ENCOUNTER — Ambulatory Visit: Payer: 59

## 2020-11-27 ENCOUNTER — Other Ambulatory Visit: Payer: 59

## 2020-11-27 ENCOUNTER — Ambulatory Visit: Payer: 59

## 2020-12-04 ENCOUNTER — Ambulatory Visit: Payer: 59 | Admitting: Radiation Oncology

## 2020-12-04 ENCOUNTER — Ambulatory Visit
Admission: RE | Admit: 2020-12-04 | Discharge: 2020-12-04 | Disposition: A | Discharge status: Home / Self Care | Payer: 59 | Source: Ambulatory Visit | Attending: Radiation Oncology | Admitting: Radiation Oncology

## 2020-12-04 ENCOUNTER — Encounter: Payer: Self-pay | Admitting: Radiation Oncology

## 2020-12-04 ENCOUNTER — Other Ambulatory Visit: Payer: Self-pay

## 2020-12-04 VITALS — BP 123/71 | HR 103 | Temp 97.5°F | Resp 20 | Wt 183.2 lb

## 2020-12-04 DIAGNOSIS — K760 Fatty (change of) liver, not elsewhere classified: Secondary | ICD-10-CM | POA: Diagnosis not present

## 2020-12-04 DIAGNOSIS — Z79899 Other long term (current) drug therapy: Secondary | ICD-10-CM | POA: Diagnosis not present

## 2020-12-04 DIAGNOSIS — G4730 Sleep apnea, unspecified: Secondary | ICD-10-CM | POA: Insufficient documentation

## 2020-12-04 DIAGNOSIS — C50411 Malignant neoplasm of upper-outer quadrant of right female breast: Secondary | ICD-10-CM | POA: Diagnosis present

## 2020-12-04 DIAGNOSIS — M1990 Unspecified osteoarthritis, unspecified site: Secondary | ICD-10-CM | POA: Diagnosis not present

## 2020-12-04 DIAGNOSIS — E119 Type 2 diabetes mellitus without complications: Secondary | ICD-10-CM | POA: Diagnosis not present

## 2020-12-04 DIAGNOSIS — Z170 Estrogen receptor positive status [ER+]: Secondary | ICD-10-CM | POA: Diagnosis not present

## 2020-12-04 DIAGNOSIS — I10 Essential (primary) hypertension: Secondary | ICD-10-CM | POA: Diagnosis not present

## 2020-12-04 NOTE — Progress Notes (Addendum)
Patient reports open wound RT breast w/ redness, and mild tenderness 1/10 and occasional insomnia (currently on Melatonin/ Advil PM). No changes of pregnancy. No other symptoms reported at this time.  Meaningful use complete.  BP 123/71   Pulse (!) 103   Temp (!) 97.5 F (36.4 C)   Resp 20   Wt 183 lb 3.2 oz (83.1 kg)   LMP 02/21/2014   SpO2 100%   BMI 32.45 kg/m

## 2020-12-04 NOTE — Progress Notes (Signed)
Radiation Oncology         ([phone]) [phone] ________________________________  Name: Yolanda [name]        MRN: [id number]  Date of Service: 12/04/2020 DOB: 1960/12/11  QB:HALPF, Bonnita Levan, MD  Magrinat, Virgie Dad, MD     REFERRING PHYSICIAN: Magrinat, Virgie Dad, MD   DIAGNOSIS: The encounter diagnosis was Malignant neoplasm of upper-outer quadrant of right breast in female, estrogen receptor positive ([name]).   HISTORY OF PRESENT ILLNESS: Yolanda [name] is a 60 y.o. female with a new diagnosis of right breast cancer.  The patient has a history of ALH and underwent excision of the left breast for this in 2018.  She noted a palpable right breast mass however and diagnostic imaging measured the lesion in the 12 o'clock position up to 3 cm in total dimension.  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  Her cancer was ER positive, PR negative,HER-2 was negative by FISH and Ki-67 was 25%.  She is seen today on my chart to discuss treatment recommendations of her cancer.    Since her last visit, she had Oncotype DX score performed on her biopsy which showed a high risk score of 40.  She proceeded with right lumpectomy and sentinel lymph node biopsy on 05/21/2020.  Findings showed a 3 cm, grade 3 invasive and in situ ductal carcinoma with negative margins for both invasive and in situ components, additional margin excisions were negative for malignancy but showed fibrocystic change and all of her 7 sampled lymph nodes were negative for metastatic disease.  She proceeded with Port-A-Cath placement and began systemic chemotherapy on 06/18/2020, she completed her last cycle on 11/13/2020.  She is seen to discuss adjuvant radiotherapy to the right breast.  Of note she has had delayed wound healing.   PREVIOUS RADIATION THERAPY: No   PAST MEDICAL HISTORY:  Past Medical History:  Diagnosis Date   Asthma     with respiratory infection   Cancer ([place])    breast cancer - right   Diabetes mellitus without complication ([place])    type II   Fatty liver    Fibroid uterus    Hypertension    Patient reports that she has never been diagnosied with HTN, "I take HCTZ for swelling in my feet, if needed."   Osteoarthritis 03/10/2015   PONV (postoperative nausea and vomiting)    2014   Refusal of blood transfusions as patient is Jehovah's Witness    NO BLOOD PRODUCTS   Sleep apnea        PAST SURGICAL HISTORY: Past Surgical History:  Procedure Laterality Date   BREAST CYST ASPIRATION     BREAST EXCISIONAL BIOPSY     BREAST LUMPECTOMY WITH SENTINEL LYMPH NODE BIOPSY Right 05/21/2020   Procedure: RIGHT BREAST LUMPECTOMY WITH SENTINEL LYMPH NODE BIOPSY;  Surgeon: Stark Klein, MD;  Location: [facility];  Service: General;  Laterality: Right;   CHOLECYSTECTOMY  1/11   COLONOSCOPY     DILATATION & CURRETTAGE/HYSTEROSCOPY WITH RESECTOCOPE N/A 03/13/2014   Procedure: DILATATION & CURETTAGE/HYSTEROSCOPY ;  Surgeon: Lyman Speller, MD;  Location: [facility];  Service: Gynecology;  Laterality: N/A;   PORTACATH PLACEMENT Left 05/21/2020   Procedure: INSERTION PORT-A-CATH;  Surgeon: Stark Klein, MD;  Location: [facility];  Service: General;  Laterality: Left;   RADIOACTIVE SEED GUIDED EXCISIONAL BREAST BIOPSY Left 12/30/2016   Procedure: RADIOACTIVE SEED GUIDED  EXCISIONAL BREAST BIOPSY;  Surgeon: Stark Klein, MD;  Location: [facility];  Service: General;  Laterality: Left;     FAMILY HISTORY:  Family History  Problem Relation Age of Onset   Asthma Mother    Diabetes Mother    Diabetes Father    Heart attack Father    Diabetes Brother    Diabetes Brother    Asthma Brother    Heart Problems Sister    Asthma Sister    Hypertension Sister    Heart disease Maternal Grandfather    Osteopenia Sister      SOCIAL HISTORY:  reports that she has never smoked. She has never used smokeless tobacco. She  reports that she does not drink alcohol and does not use drugs. The patient is married and lives in [place]. She is retired from working in [employer]. She is originally from [place].  She is accompanied by her husband.  ALLERGIES: Lisinopril   MEDICATIONS:  Current Outpatient Medications  Medication Sig Dispense Refill   albuterol (PROVENTIL HFA;VENTOLIN HFA) 108 (90 Base) MCG/ACT inhaler Inhale 1-2 puffs into the lungs every 4 (four) hours as needed for wheezing or shortness of breath. 18 g 2   atorvastatin (LIPITOR) 10 MG tablet Take 10 mg by mouth every other day.     b complex vitamins capsule Take 1 capsule by mouth every other day.     B-D ULTRA-FINE 33 LANCETS MISC Check BG 2 times/day     Carboxymethylcellul-Glycerin (REFRESH OPTIVE OP) Place 1 drop into both eyes 3 (three) times daily as needed (dry / irritated eyes).     cholecalciferol (VITAMIN D3) 25 MCG (1000 UNIT) tablet Take 1,000 Units by mouth every other day.     doxycycline (VIBRA-TABS) 100 MG tablet Take 100 mg by mouth 2 (two) times daily. (Patient not taking: No sig reported)     empagliflozin (JARDIANCE) 10 MG TABS tablet Take 1 tablet (10 mg total) by mouth daily before breakfast. 30 tablet 6   fluticasone (FLONASE) 50 MCG/ACT nasal spray Place 1 spray into both nostrils daily as needed for allergies or rhinitis.     glucose blood test strip Check BG 2 times/day.  Dx E11.65     lidocaine-prilocaine (EMLA) cream Apply to affected area once 30 g 3   loratadine (CLARITIN) 10 MG tablet Take 1 tablet (10 mg total) by mouth daily. 90 tablet 0   magic mouthwash w/lidocaine SOLN Take 5 mLs by mouth 4 (four) times daily as needed for mouth pain. 240 mL 1   milk thistle 175 MG tablet Take 175 mg by mouth every other day.     Omega-3 Fatty Acids (OMEGA 3 PO) Take 1 capsule by mouth every other day.     omeprazole (PRILOSEC) 40 MG capsule Take 40 mg by mouth daily as needed (acid reflux).     prochlorperazine  (COMPAZINE) 5 MG tablet Take 1 tablet (5 mg total) by mouth every 6 (six) hours as needed for nausea or vomiting. 30 tablet 0   valACYclovir (VALTREX) 500 MG tablet Take 1 tablet (500 mg total) by mouth daily. 60 tablet 1   vitamin C (ASCORBIC ACID) 500 MG tablet Take 500 mg by mouth every other day.     No current facility-administered medications for this encounter.   Facility-Administered Medications Ordered in Other Encounters  Medication Dose Route Frequency Provider Last Rate Last Admin   sodium chloride flush (NS) 0.9 % injection 10 mL  10 mL Intracatheter  PRN Magrinat, Gustav C, MD         REVIEW OF SYSTEMS: On review of systems she states that she felt like she tolerated chemotherapy very well and her only complaint was of fatigue.  She states that she has not had difficulty with peripheral neuropathy.  She has been dealing with wound healing however and has been packing her lumpectomy site.  She has been offered secondary closure but has hoped that her breast would heal without that type of intervention.  She states that when she packs her site, it is approximately an inch in depth.  She denies any fevers or chills.  No other complaints are verbalized.    PHYSICAL EXAM:  Wt Readings from Last 3 Encounters:  12/04/20 183 lb 3.2 oz (83.1 kg)  11/13/20 180 lb 8 oz (81.9 kg)  11/06/20 182 lb 8 oz (82.8 kg)   Temp Readings from Last 3 Encounters:  12/04/20 (!) 97.5 F (36.4 C)  11/13/20 98.4 F (36.9 C) (Tympanic)  11/06/20 98.6 F (37 C) (Tympanic)   BP Readings from Last 3 Encounters:  12/04/20 123/71  11/13/20 136/84  11/06/20 (!) 143/86   Pulse Readings from Last 3 Encounters:  12/04/20 (!) 103  11/13/20 86  11/06/20 85   In general this is a well appearing central [place] woman in no acute distress.  She's alert and oriented x4 and appropriate throughout the examination. Cardiopulmonary assessment is negative for acute distress and she exhibits normal effort.  Her  right breast has a tunneled wound that has a depth of approximately 2-1/2 to 3 cm, there is granulation tissue and fibrin noted in the wound bed after her packing strip was removed.  She had visible surgical clips in the wound, a sterile cotton-tipped applicator was utilized to probe the wound, interestingly in doing so the 3 titanium clips were removed, no bleeding or disruption of the wound itself was identified.  With dry sterile gauze this was replaced and lightly packed.   ECOG = 0  0 - Asymptomatic (Fully active, able to carry on all predisease activities without restriction)  1 - Symptomatic but completely ambulatory (Restricted in physically strenuous activity but ambulatory and able to carry out work of a light or sedentary nature. For example, light housework, office work)  2 - Symptomatic, <50% in bed during the day (Ambulatory and capable of all self care but unable to carry out any work activities. Up and about more than 50% of waking hours)  3 - Symptomatic, >50% in bed, but not bedbound (Capable of only limited self-care, confined to bed or chair 50% or more of waking hours)  4 - Bedbound (Completely disabled. Cannot carry on any self-care. Totally confined to bed or chair)  5 - Death   Oken MM, Creech RH, Tormey DC, et al. (1982). "Toxicity and response criteria of the Eastern Cooperative Oncology Group". Am. J. Clin. Oncol. 5 (6): 649-55    LABORATORY DATA:  Lab Results  Component Value Date   WBC 4.4 11/13/2020   HGB 11.2 (L) 11/13/2020   HCT 34.6 (L) 11/13/2020   MCV 100.9 (H) 11/13/2020   PLT 281 11/13/2020   Lab Results  Component Value Date   NA 140 11/13/2020   K 4.3 11/13/2020   CL 106 11/13/2020   CO2 24 11/13/2020   Lab Results  Component Value Date   ALT 25 11/13/2020   AST 23 11/13/2020   ALKPHOS 130 (H) 11/13/2020   BILITOT 0.3 11/13/2020        RADIOGRAPHY: No results found.      IMPRESSION/PLAN: 1. Stage IIA, pT2N0M0, grade 3, ER/PR  positive invasive ductal carcinoma of the right breast. Dr. Lisbeth Renshaw reviews her final pathology findings and reviews the nature of right breast disease.  She still is in the process of healing and we discussed the importance of the wound fully closing prior to proceeding with radiotherapy.  Dr. Lisbeth Renshaw does however still recommend despite the delays the benefits of external radiotherapy to the breast  to reduce risks of local recurrence followed by antiestrogen therapy. We discussed the risks, benefits, short, and long term effects of radiotherapy, as well as the curative intent, and the patient is interested in proceeding. Dr. Lisbeth Renshaw discusses the delivery and logistics of radiotherapy and recommends 4  weeks of radiotherapy to the right breast. Written consent is obtained and placed in the chart, a copy was provided to the patient.  We were originally planning to proceed with simulation today but she is not quite ready for this.  We will delay 3 weeks, she does have our contact information if at that time she is still not healed so that we can delay simulation further.   In a visit lasting 45 minutes, greater than 50% of the time was spent face to face discussing the patient's condition, in preparation for the discussion, and coordinating the patient's care.    The above documentation reflects my direct findings during this shared patient visit. Please see the separate note by Dr. Lisbeth Renshaw on this date for the remainder of the patient's plan of care.    Carola Rhine, [facility]    **Disclaimer: This note was dictated with voice recognition software. Similar sounding words can inadvertently be transcribed and this note may contain transcription errors which may not have been corrected upon publication of note.**

## 2020-12-09 ENCOUNTER — Encounter: Payer: Self-pay | Admitting: *Deleted

## 2020-12-11 ENCOUNTER — Other Ambulatory Visit: Payer: Self-pay

## 2020-12-11 ENCOUNTER — Encounter (HOSPITAL_BASED_OUTPATIENT_CLINIC_OR_DEPARTMENT_OTHER): Payer: Self-pay

## 2020-12-11 ENCOUNTER — Inpatient Hospital Stay (HOSPITAL_BASED_OUTPATIENT_CLINIC_OR_DEPARTMENT_OTHER): Payer: 59 | Admitting: Adult Health

## 2020-12-11 ENCOUNTER — Inpatient Hospital Stay: Payer: 59 | Attending: Oncology

## 2020-12-11 ENCOUNTER — Ambulatory Visit (HOSPITAL_BASED_OUTPATIENT_CLINIC_OR_DEPARTMENT_OTHER): Admit: 2020-12-11 | Payer: 59 | Admitting: General Surgery

## 2020-12-11 ENCOUNTER — Encounter: Payer: Self-pay | Admitting: Adult Health

## 2020-12-11 VITALS — BP 133/71 | HR 78 | Temp 97.7°F | Resp 18 | Ht 63.0 in | Wt 180.9 lb

## 2020-12-11 DIAGNOSIS — R5383 Other fatigue: Secondary | ICD-10-CM | POA: Insufficient documentation

## 2020-12-11 DIAGNOSIS — Z170 Estrogen receptor positive status [ER+]: Secondary | ICD-10-CM

## 2020-12-11 DIAGNOSIS — Z452 Encounter for adjustment and management of vascular access device: Secondary | ICD-10-CM | POA: Insufficient documentation

## 2020-12-11 DIAGNOSIS — C50411 Malignant neoplasm of upper-outer quadrant of right female breast: Secondary | ICD-10-CM | POA: Diagnosis present

## 2020-12-11 DIAGNOSIS — Z95828 Presence of other vascular implants and grafts: Secondary | ICD-10-CM

## 2020-12-11 LAB — CBC WITH DIFFERENTIAL/PLATELET
Abs Immature Granulocytes: 0.02 10*3/uL (ref 0.00–0.07)
Basophils Absolute: 0.1 10*3/uL (ref 0.0–0.1)
Basophils Relative: 1 %
Eosinophils Absolute: 0.5 10*3/uL (ref 0.0–0.5)
Eosinophils Relative: 8 %
HCT: 37.5 % (ref 36.0–46.0)
Hemoglobin: 11.8 g/dL — ABNORMAL LOW (ref 12.0–15.0)
Immature Granulocytes: 0 %
Lymphocytes Relative: 28 %
Lymphs Abs: 1.9 10*3/uL (ref 0.7–4.0)
MCH: 30.3 pg (ref 26.0–34.0)
MCHC: 31.5 g/dL (ref 30.0–36.0)
MCV: 96.4 fL (ref 80.0–100.0)
Monocytes Absolute: 0.8 10*3/uL (ref 0.1–1.0)
Monocytes Relative: 12 %
Neutro Abs: 3.5 10*3/uL (ref 1.7–7.7)
Neutrophils Relative %: 51 %
Platelets: 262 10*3/uL (ref 150–400)
RBC: 3.89 MIL/uL (ref 3.87–5.11)
RDW: 14.8 % (ref 11.5–15.5)
WBC: 6.8 10*3/uL (ref 4.0–10.5)
nRBC: 0 % (ref 0.0–0.2)

## 2020-12-11 LAB — COMPREHENSIVE METABOLIC PANEL
ALT: 25 U/L (ref 0–44)
AST: 26 U/L (ref 15–41)
Albumin: 3.7 g/dL (ref 3.5–5.0)
Alkaline Phosphatase: 151 U/L — ABNORMAL HIGH (ref 38–126)
Anion gap: 11 (ref 5–15)
BUN: 13 mg/dL (ref 8–23)
CO2: 24 mmol/L (ref 22–32)
Calcium: 10.3 mg/dL (ref 8.9–10.3)
Chloride: 102 mmol/L (ref 98–111)
Creatinine, Ser: 0.71 mg/dL (ref 0.44–1.00)
GFR, Estimated: >60 mL/min (ref >60)
Glucose, Bld: 114 mg/dL — ABNORMAL HIGH (ref 70–99)
Potassium: 4.1 mmol/L (ref 3.5–5.1)
Sodium: 137 mmol/L (ref 135–145)
Total Bilirubin: 0.4 mg/dL (ref 0.3–1.2)
Total Protein: 7.1 g/dL (ref 6.5–8.1)

## 2020-12-11 SURGERY — REMOVAL PORT-A-CATH
Anesthesia: General | Site: Breast | Laterality: Right

## 2020-12-11 MED ORDER — HEPARIN SOD (PORK) LOCK FLUSH 100 UNIT/ML IV SOLN
500.0000 [IU] | Freq: Once | INTRAVENOUS | Status: AC
  Administered 2020-12-11: 500 [IU] via INTRAVENOUS

## 2020-12-11 MED ORDER — SODIUM CHLORIDE 0.9% FLUSH
10.0000 mL | INTRAVENOUS | Status: DC | PRN
Start: 2020-12-11 — End: 2020-12-11
  Administered 2020-12-11: 10 mL

## 2020-12-11 NOTE — Progress Notes (Signed)
[place]  [phone] [phone]     ID: Yolanda [name] DOB: February 22, 1960  MR#: [id number]  ERD#:[id number]  Patient Care Team: Mosie Lukes, MD as PCP - General (Family Medicine) Michel Bickers, MD as Consulting Physician (Infectious Diseases) Megan Salon, MD as Consulting Physician (Gynecology) Magrinat, Virgie Dad, MD as Consulting Physician (Oncology) Yolanda Craver, MD as Consulting Physician (Gastroenterology) Stark Klein, MD as Consulting Physician (General Surgery) Mauro Kaufmann, RN as Oncology Nurse Navigator Rockwell Germany, RN as Oncology Nurse Navigator Amedeo Kinsman, [place] (Optometry) Scot Dock, NP OTHER MD:   CHIEF COMPLAINT: Estrogen receptor positive breast cancer  CURRENT TREATMENT: completing Adjuvant chemotherapy, to receive adjuvant radiation   INTERVAL HISTORY: Yolanda [name] returns today for follow up and treatment of her estrogen receptor positive breast cancer.    She completed her taxol 4 weeks ago.  She says that her open breast wound is healing more.  She met with radiation oncology a couple of weeks ago and she is going to go back on 12/25/2020 to see if her breast is healed enough by then.    Yolanda [name] has some constipation.  This has been going on for 2-3 weeks.  She is drinking one liter per day and is taking miralax and stool softeners.  Her stool is thin and small.  She is bloated.  She denies any cramping or pain in her abdomen.  She is still passing gas.     REVIEW OF SYSTEMS: Review of Systems  Constitutional:  Positive for fatigue. Negative for appetite change, chills, fever and unexpected weight change.  HENT:   Negative for hearing loss, lump/mass and trouble swallowing.   Eyes:  Negative for eye problems and icterus.  Respiratory:  Negative for chest tightness, cough and shortness of breath.   Cardiovascular:  Negative for chest pain, leg swelling and palpitations.  Gastrointestinal:  Positive for  constipation. Negative for abdominal distention, abdominal pain, diarrhea, nausea and vomiting.  Endocrine: Negative for hot flashes.  Genitourinary:  Negative for difficulty urinating.   Musculoskeletal:  Negative for arthralgias.  Skin:  Negative for itching and rash.  Neurological:  Negative for dizziness, extremity weakness, headaches and numbness.  Hematological:  Negative for adenopathy. Does not bruise/bleed easily.  Psychiatric/Behavioral:  Negative for depression. The patient is not nervous/anxious.       COVID 19 VACCINATION STATUS: fully vaccinated [employer]), with booster 12/2019   HISTORY OF CURRENT ILLNESS: From the original intake note:   I saw Ms. Saadeh in December 2018 for evaluation of atypical lobular hyperplasia.  We discussed various strategies regarding breast cancer prevention at that time for her to consider.    More recently (around the time of the Super Bowl 2022) she found a lump in her right breast.  She underwent bilateral diagnostic mammography with tomography and right breast ultrasonography at [facility] on 04/10/2020 showing: breast density category C; palpable 3 cm mass in right breast at 12 o'clock; no enlarged adenopathy in right axilla.  Accordingly on the same day, she proceeded to biopsy of the right breast area in question. The pathology from this procedure ([id number]) showed: invasive ductal carcinoma, grade 3; ductal carcinoma in situ. Prognostic indicators significant for: estrogen receptor, 90% positive with moderate staining intensity and progesterone receptor, 0% negative. Proliferation marker Ki67 at 25%. HER2 equivocal by immunohistochemistry (2+), but negative by fluorescent in situ hybridization with a signals ratio 1.49 and number per cell 2.75.  Cancer Staging Malignant neoplasm  of upper-outer quadrant of right breast in female, estrogen receptor positive (HCC) Staging form: Breast, AJCC 8th Edition - Clinical: Stage IIA (cT2,  cN0, cM0, G3, ER+, PR+, HER2-) - Signed by Lowella Dell, MD on 04/18/2020  The patient's subsequent history is as detailed below.   PAST MEDICAL HISTORY: Past Medical History:  Diagnosis Date   Asthma    with respiratory infection   Cancer (HCC)    breast cancer - right   Diabetes mellitus without complication (HCC)    type II   Fatty liver    Fibroid uterus    Hypertension    Patient reports that she has never been diagnosied with HTN, "I take HCTZ for swelling in my feet, if needed."   Osteoarthritis 03/10/2015   PONV (postoperative nausea and vomiting)    2014   Refusal of blood transfusions as patient is Jehovah's Witness    NO BLOOD PRODUCTS   Sleep apnea     PAST SURGICAL HISTORY: Past Surgical History:  Procedure Laterality Date   BREAST CYST ASPIRATION     BREAST EXCISIONAL BIOPSY     BREAST LUMPECTOMY WITH SENTINEL LYMPH NODE BIOPSY Right 05/21/2020   Procedure: RIGHT BREAST LUMPECTOMY WITH SENTINEL LYMPH NODE BIOPSY;  Surgeon: Almond Lint, MD;  Location: [facility] OR;  Service: General;  Laterality: Right;   CHOLECYSTECTOMY  1/11   COLONOSCOPY     DILATATION & CURRETTAGE/HYSTEROSCOPY WITH RESECTOCOPE N/A 03/13/2014   Procedure: DILATATION & CURETTAGE/HYSTEROSCOPY ;  Surgeon: Annamaria Boots, MD;  Location: [facility];  Service: Gynecology;  Laterality: N/A;   PORTACATH PLACEMENT Left 05/21/2020   Procedure: INSERTION PORT-A-CATH;  Surgeon: Almond Lint, MD;  Location: [facility] OR;  Service: General;  Laterality: Left;   RADIOACTIVE SEED GUIDED EXCISIONAL BREAST BIOPSY Left 12/30/2016   Procedure: RADIOACTIVE SEED GUIDED EXCISIONAL BREAST BIOPSY;  Surgeon: Almond Lint, MD;  Location:  SURGERY CENTER;  Service: General;  Laterality: Left;    FAMILY HISTORY: Family History  Problem Relation Age of Onset   Asthma Mother    Diabetes Mother    Diabetes Father    Heart attack Father    Diabetes Brother    Diabetes Brother    Asthma Brother    Heart Problems  Sister    Asthma Sister    Hypertension Sister    Heart disease Maternal Grandfather    Osteopenia Sister   The patient's father died from heart disease at the age of 83.  The patient's mother is 62 years old as of March 2022.  The patient has 4 brothers and 2 sisters.  She is not aware of any history of cancer in her family.   GYNECOLOGIC HISTORY:  Patient's last menstrual period was 02/21/2014. Menarche: 60 years old GX P 0 LMP age 87 Contraceptive 1 year, no complications HRT no  Hysterectomy? no BSO? 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  Her husband Yolanda [name] works as a Systems developer for [employer].  He has 2 children from a prior marriage, both living in [place].  One is traveling nurse.  The other 1 is a Naval architect.  The patient is a Scientist, product/process development.     ADVANCED DIRECTIVES: In the absence of any documentation to the contrary, the patient's spouse is their HCPOA.  The patient made it clear at her visit 04/18/2020 that she would not accept any blood transfusion or blood products for any reason including to stave off death  HEALTH MAINTENANCE: Social History   Tobacco Use   Smoking status: Never   Smokeless tobacco: Never  Vaping Use   Vaping Use: Never used  Substance Use Topics   Alcohol use: No   Drug use: No     Colonoscopy: 10/2019 (Dr. Collene Mares), recall 2026  PAP: 03/2017, negative  Bone density: 09/2018, -0.8   Allergies  Allergen Reactions   Lisinopril Shortness Of Breath and Cough    wheezing    Current Outpatient Medications  Medication Sig Dispense Refill   albuterol (PROVENTIL HFA;VENTOLIN HFA) 108 (90 Base) MCG/ACT inhaler Inhale 1-2 puffs into the lungs every 4 (four) hours as needed for wheezing or shortness of breath. 18 g 2   atorvastatin (LIPITOR) 10 MG tablet Take 10 mg by mouth every other day.     b complex vitamins capsule Take 1 capsule by mouth every other day.     B-D ULTRA-FINE 33 LANCETS MISC Check  BG 2 times/day     Carboxymethylcellul-Glycerin (REFRESH OPTIVE OP) Place 1 drop into both eyes 3 (three) times daily as needed (dry / irritated eyes).     cholecalciferol (VITAMIN D3) 25 MCG (1000 UNIT) tablet Take 1,000 Units by mouth every other day.     doxycycline (VIBRA-TABS) 100 MG tablet Take 100 mg by mouth 2 (two) times daily. (Patient not taking: No sig reported)     empagliflozin (JARDIANCE) 10 MG TABS tablet Take 1 tablet (10 mg total) by mouth daily before breakfast. 30 tablet 6   fluticasone (FLONASE) 50 MCG/ACT nasal spray Place 1 spray into both nostrils daily as needed for allergies or rhinitis.     glucose blood test strip Check BG 2 times/day.  Dx E11.65     lidocaine-prilocaine (EMLA) cream Apply to affected area once 30 g 3   loratadine (CLARITIN) 10 MG tablet Take 1 tablet (10 mg total) by mouth daily. 90 tablet 0   milk thistle 175 MG tablet Take 175 mg by mouth every other day.     Omega-3 Fatty Acids (OMEGA 3 PO) Take 1 capsule by mouth every other day.     omeprazole (PRILOSEC) 40 MG capsule Take 40 mg by mouth daily as needed (acid reflux).     prochlorperazine (COMPAZINE) 5 MG tablet Take 1 tablet (5 mg total) by mouth every 6 (six) hours as needed for nausea or vomiting. 30 tablet 0   valACYclovir (VALTREX) 500 MG tablet Take 1 tablet (500 mg total) by mouth daily. 60 tablet 1   vitamin C (ASCORBIC ACID) 500 MG tablet Take 500 mg by mouth every other day.     No current facility-administered medications for this visit.   Facility-Administered Medications Ordered in Other Visits  Medication Dose Route Frequency Provider Last Rate Last Admin   sodium chloride flush (NS) 0.9 % injection 10 mL  10 mL Intracatheter PRN Magrinat, Virgie Dad, MD       SUPPLEMENTS: Aside from the medications listed above, the patient takes a teaspoon of olive oil daily, 1000 mg of vitamin C every other day, sunflower lecithin 600 mg every other day, vitamin D3 5000 mg every other day,  magnesium 200 mg every other day, milk thistle 150 mg every other day, a B complex vitamin (B6 and B12) every other day, as well as Osteo Bi-Flex weekly and fluticasone as needed  OBJECTIVE:  Vitals:   11/06/20 0926  BP: (!) 143/86  Pulse: 85  Resp: 18  Temp: 98.6 F (37 C)  SpO2: 100%  Body mass index is 32.33 kg/m.   Wt Readings from Last 3 Encounters:  11/06/20 182 lb 8 oz (82.8 kg)  10/30/20 182 lb 1.6 oz (82.6 kg)  10/23/20 184 lb 6.4 oz (83.6 kg)  ECOG FS:1 - Symptomatic but completely ambulatory GENERAL: Patient is a well appearing female in no acute distress HEENT:  Sclerae anicteric.  Oropharynx clear and moist. No ulcerations or evidence of oropharyngeal candidiasis. Neck is supple.  NODES:  No cervical, supraclavicular, or axillary lymphadenopathy palpated.  BREAST EXAM:  Deferred today LUNGS:  Clear to auscultation bilaterally.  No wheezes or rhonchi. HEART:  Regular rate and rhythm. No murmur appreciated. ABDOMEN:  Soft, nontender.  Positive, normoactive bowel sounds. No organomegaly palpated. MSK:  No focal spinal tenderness to palpation. Full range of motion bilaterally in the upper extremities. EXTREMITIES:  No peripheral edema.   SKIN:  Clear with no obvious rashes or skin changes. No nail dyscrasia. NEURO:  Nonfocal. Well oriented.  Appropriate affect.   LAB RESULTS:  CMP     Component Value Date/Time   NA 139 11/06/2020 0828   NA 138 11/04/2017 0000   K 3.9 11/06/2020 0828   CL 104 11/06/2020 0828   CO2 25 11/06/2020 0828   GLUCOSE 228 (H) 11/06/2020 0828   BUN 15 11/06/2020 0828   BUN 14 11/04/2017 0000   CREATININE 0.71 11/06/2020 0828   CREATININE 0.81 04/18/2020 1527   CREATININE 0.60 09/07/2014 0950   CALCIUM 10.3 11/06/2020 0828   CALCIUM 9.7 10/02/2020 1030   PROT 6.6 11/06/2020 0828   ALBUMIN 3.5 11/06/2020 0828   AST 18 11/06/2020 0828   AST 26 04/18/2020 1527   ALT 21 11/06/2020 0828   ALT 37 04/18/2020 1527   ALKPHOS 144 (H)  11/06/2020 0828   BILITOT 0.3 11/06/2020 0828   BILITOT 0.3 04/18/2020 1527   GFRNONAA >60 11/06/2020 0828   GFRNONAA >60 04/18/2020 1527   GFRAA >60 05/15/2019 1803    No results found for: TOTALPROTELP, ALBUMINELP, A1GS, A2GS, BETS, BETA2SER, GAMS, MSPIKE, SPEI  Lab Results  Component Value Date   WBC 4.7 11/06/2020   NEUTROABS 2.9 11/06/2020   HGB 10.7 (L) 11/06/2020   HCT 33.3 (L) 11/06/2020   MCV 101.2 (H) 11/06/2020   PLT 293 11/06/2020    No results found for: LABCA2  No components found for: WUXLKG401  No results for input(s): INR in the last 168 hours.  No results found for: LABCA2  No results found for: UUV253  No results found for: GUY403  No results found for: KVQ259  No results found for: CA2729  No components found for: HGQUANT  No results found for: CEA1 / No results found for: CEA1   No results found for: AFPTUMOR  No results found for: CHROMOGRNA  No results found for: KPAFRELGTCHN, LAMBDASER, KAPLAMBRATIO (kappa/lambda light chains)  No results found for: HGBA, HGBA2QUANT, HGBFQUANT, HGBSQUAN (Hemoglobinopathy evaluation)   No results found for: LDH  Lab Results  Component Value Date   IRON 38 (L) 10/16/2009   IRONPCTSAT 10.2 (L) 10/16/2009   (Iron and TIBC)  Lab Results  Component Value Date   FERRITIN 8.9 (L) 10/16/2009    Urinalysis    Component Value Date/Time   COLORURINE YELLOW 03/11/2009 1100   APPEARANCEUR CLEAR 03/11/2009 1100   LABSPEC 1.017 03/11/2009 1100   PHURINE 7.0 03/11/2009 1100   GLUCOSEU NEGATIVE 03/11/2009 1100   HGBUR LARGE (A) 03/11/2009 1100   BILIRUBINUR N 05/21/2015 1614   [place]  03/11/2009 1100   PROTEINUR N 05/21/2015 1614   PROTEINUR NEGATIVE 03/11/2009 1100   UROBILINOGEN negative 05/21/2015 1614   UROBILINOGEN 0.2 03/11/2009 1100   NITRITE N 05/21/2015 1614   NITRITE NEGATIVE 03/11/2009 1100   LEUKOCYTESUR Negative 05/21/2015 1614    STUDIES: No results  found.   ELIGIBLE FOR AVAILABLE RESEARCH PROTOCOL: no  ASSESSMENT: 60 y.o. [place] woman status post right breast upper outer quadrant biopsy 04/10/2020 for a clinical T2N0, stage IIA invasive ductal carcinoma, grade 3, estrogen receptor positive, progesterone receptor and HER-2 negative, with an MIB-1 of 25%.  (1) Oncotype obtained from the original biopsy shows a score of 48, predicting a recurrence rate outside the breast within 9 years of 28% if the only systemic therapy is antiestrogen for 5 years.  It also predicts a greater than 15% benefit from chemotherapy.  (2) right lumpectomy and sentinel lymph node dissection 05/21/2020 shows a pT2 pN0, stage IIB invasive ductal carcinoma, grade 3, with negative margins.  (3) adjuvant chemotherapy to consist of cyclophosphamide and doxorubicin in dose dense fashion x4 starting 06/18/2020, with Neulasta day 3; to be followed by paclitaxel weekly x12 started 08/28/2020 through 11/13/2020  (a) echo 05/13/2020 shows an ejection fraction in the 55-60% range  (4) adjuvant radiation to begin 12/25/2020  (5) antiestrogens.   PLAN: Yolanda [name] is doing well after completing chemotherapy. She plans to hopefully proceed with radiation on 12/25/2020.  She and I discussed her vaccines and she will wait another week for her covid vaccine.  She plans on getting the shingles vaccine in aobut a month.    Essance is doing well in regards to her fatigue improving.  I recommended she continue to walk 12 minutes a day and lowly add one minute per week  Caya is taking a lot of stool softeners.  I suggested she consider eating more solid food as she had changed to eating more broth, and take mag citrate 1/2 bottle if she needs to.  I also suggested she reach out to her GI specialist since the size and shape of her stool has changed as well.  She plans to do this.  We will see her back in 4 weeks for f/u with Dr. Jana Hakim.  I am seeing her in February for her SCP visit.  She  knows to call for any questions that may arise between now and her next appointment.  We are happy to see her sooner if needed.   Total encounter time 20 minutes.Wilber Bihari, NP 11/06/20 9:40 AM Medical Oncology and Hematology [facility] [place], [place] [address] Tel. [phone]    Fax. [phone]   *Total Encounter Time as defined by the Centers for Medicare and Medicaid Services includes, in addition to the face-to-face time of a patient visit (documented in the note above) non-face-to-face time: obtaining and reviewing outside history, ordering and reviewing medications, tests or procedures, care coordination (communications with other health care professionals or caregivers) and documentation in the medical record.

## 2020-12-23 NOTE — Progress Notes (Signed)
Name: Yolanda [name]  MRN/ DOB: [id number], 07-03-1960   Age/ Sex: 60 y.o., female    PCP: Mosie Lukes, MD   Reason for Endocrinology Evaluation: Type 2 Diabetes Mellitus     Date of Initial Endocrinology Visit: 08/20/2020    PATIENT IDENTIFIER: Yolanda [name] is a 60 y.o. female with a past medical history of T2DM, Asthma, Breast ca and hx of Fatty liver. The patient presented for initial endocrinology clinic visit on 08/20/2020 for consultative assistance with her diabetes management.    Pt on adjuvant therapy for right Breast ca started 06/2020 ( cyclophosphamide, doxorubicin with pegfilgrastim on day 3 )but  she started weekly paclitaxel 08/21/2020.   DIABETIC HISTORY:  Yolanda [name] was diagnosed with DM in 2015. She has been on metformin only since her diagnosis.  Her hemoglobin A1c has ranged from 6.9% in 2019, peaking at 7.5% in 2020.   On her initial visit to our clinic she had an A1c of 7.4%, she was on metformin only, she has noted hyperglycemia during chemotherapy, we started Jardiance at the time.   SUBJECTIVE:   During the last visit (08/20/2020): A1c 7.4%, continued metformin and started Jardiance  Today (12/24/2020): Yolanda [name] is here for follow-up on diabetes management.  She checks her blood sugars 1 times daily. The patient has not had hypoglycemic episodes since the last clinic visit.   She continues to follow up with oncology for breast CA,completer chemo 11/13/2020. Starting Radiation soon   She had stopped Metformin , she was under the impression that Jardiance was started as a replacement for Metformin   Noted constipation  She has been out of Jardiance since Sunday , she wasn't sure I needed her to continue on it ?  HOME DIABETES REGIMEN: Metformin 500 mg XR 2 tabs BID  Jardiance 10 mg daily  Statin: yes ACE-I/ARB: no- Allergic to lisinopril  Prior Diabetic Education: no   METER DOWNLOAD SUMMARY: Date range evaluated:  10/25-11/09/2020  Average Number Tests/Day = 0.6 Overall Mean FS Glucose = 146 Standard Deviation = 11  BG Ranges: Low = 132 High = 167   Hypoglycemic Events/30 Days: BG < 50 = 0 Episodes of symptomatic severe hypoglycemia = 0   DIABETIC COMPLICATIONS: Microvascular complications:   Denies: CKD, neuropathy, retinopathy  Last eye exam: Completed 09/16/2020  Macrovascular complications:   Denies: CAD, PVD, CVA   PAST HISTORY: Past Medical History:  Past Medical History:  Diagnosis Date   Asthma    with respiratory infection   Cancer ([place])    breast cancer - right   Diabetes mellitus without complication ([place])    type II   Fatty liver    Fibroid uterus    Hypertension    Patient reports that she has never been diagnosied with HTN, "I take HCTZ for swelling in my feet, if needed."   Osteoarthritis 03/10/2015   PONV (postoperative nausea and vomiting)    2014   Refusal of blood transfusions as patient is Jehovah's Witness    NO BLOOD PRODUCTS   Sleep apnea    Past Surgical History:  Past Surgical History:  Procedure Laterality Date   BREAST CYST ASPIRATION     BREAST EXCISIONAL BIOPSY     BREAST LUMPECTOMY WITH SENTINEL LYMPH NODE BIOPSY Right 05/21/2020   Procedure: RIGHT BREAST LUMPECTOMY WITH SENTINEL LYMPH NODE BIOPSY;  Surgeon: Stark Klein, MD;  Location: [facility];  Service: General;  Laterality: Right;   CHOLECYSTECTOMY  1/11   COLONOSCOPY  DILATATION & CURRETTAGE/HYSTEROSCOPY WITH RESECTOCOPE N/A 03/13/2014   Procedure: DILATATION & CURETTAGE/HYSTEROSCOPY ;  Surgeon: Lyman Speller, MD;  Location: [facility];  Service: Gynecology;  Laterality: N/A;   PORTACATH PLACEMENT Left 05/21/2020   Procedure: INSERTION PORT-A-CATH;  Surgeon: Stark Klein, MD;  Location: [facility];  Service: General;  Laterality: Left;   RADIOACTIVE SEED GUIDED EXCISIONAL BREAST BIOPSY Left 12/30/2016   Procedure: RADIOACTIVE SEED GUIDED EXCISIONAL BREAST BIOPSY;  Surgeon: Stark Klein,  MD;  Location: [facility];  Service: General;  Laterality: Left;    Social History:  reports that she has never smoked. She has never used smokeless tobacco. She reports that she does not drink alcohol and does not use drugs. Family History:  Family History  Problem Relation Age of Onset   Asthma Mother    Diabetes Mother    Diabetes Father    Heart attack Father    Diabetes Brother    Diabetes Brother    Asthma Brother    Heart Problems Sister    Asthma Sister    Hypertension Sister    Heart disease Maternal Grandfather    Osteopenia Sister      HOME MEDICATIONS: Allergies as of 12/24/2020       Reactions   Lisinopril Shortness Of Breath, Cough   wheezing        Medication List        Accurate as of December 24, 2020  8:17 AM. If you have any questions, ask your nurse or doctor.          STOP taking these medications    cholecalciferol 25 MCG (1000 UNIT) tablet Commonly known as: VITAMIN D3 Stopped by: Dorita Sciara, MD   prochlorperazine 5 MG tablet Commonly known as: COMPAZINE Stopped by: Dorita Sciara, MD   REFRESH OPTIVE OP Stopped by: Dorita Sciara, MD       TAKE these medications    albuterol 108 (90 Base) MCG/ACT inhaler Commonly known as: VENTOLIN HFA Inhale 1-2 puffs into the lungs every 4 (four) hours as needed for wheezing or shortness of breath.   atorvastatin 10 MG tablet Commonly known as: LIPITOR Take 10 mg by mouth every other day.   B-D ULTRA-FINE 33 LANCETS Misc Check BG 2 times/day   empagliflozin 10 MG Tabs tablet Commonly known as: Jardiance Take 1 tablet (10 mg total) by mouth daily before breakfast.   fluticasone 50 MCG/ACT nasal spray Commonly known as: FLONASE Place 1 spray into both nostrils daily as needed for allergies or rhinitis.   glucose blood test strip Check BG 2 times/day.  Dx E11.65   loratadine 10 MG tablet Commonly known as: Claritin Take 1 tablet (10 mg total)  by mouth daily.   magic mouthwash w/lidocaine Soln Take 5 mLs by mouth 4 (four) times daily as needed for mouth pain.   milk thistle 175 MG tablet Take 175 mg by mouth every other day.   multivitamin Liqd Take 5 mLs by mouth daily.   OMEGA 3 PO Take 1 capsule by mouth every other day.   omeprazole 40 MG capsule Commonly known as: PRILOSEC Take 40 mg by mouth daily as needed (acid reflux).   vitamin C 500 MG tablet Commonly known as: ASCORBIC ACID Take 500 mg by mouth every other day.         ALLERGIES: Allergies  Allergen Reactions   Lisinopril Shortness Of Breath and Cough    wheezing     REVIEW OF SYSTEMS:  A comprehensive ROS was conducted with the patient and is negative except as per HPI and below:  Review of Systems  Eyes:  Positive for blurred vision.  Gastrointestinal:  Negative for nausea and vomiting.  Neurological:  Positive for tingling.     OBJECTIVE:   VITAL SIGNS: BP 138/74 (BP Location: Left Arm, Patient Position: Sitting, Cuff Size: Small)   Pulse 74   Ht 5\' 3"  (1.6 m)   Wt 182 lb 3.2 oz (82.6 kg)   LMP 02/21/2014   SpO2 96%   BMI 32.28 kg/m    PHYSICAL EXAM:  General: Pt appears well and is in NAD  Lungs: Clear with good BS bilat with no rales, rhonchi, or wheezes  Heart: RRR with normal S1 and S2 and no gallops; no murmurs; no rub  Abdomen: Normoactive bowel sounds, soft, nontender, without masses or organomegaly palpable  Extremities:  Lower extremities - No pretibial edema. No lesions.  Skin: Normal texture and temperature to palpation. No rash noted.   Neuro: MS is good with appropriate affect, pt is alert and Ox3    DM foot exam: 08/20/2020  The skin of the feet is intact without sores or ulcerations. The pedal pulses are 2+ on right and 2+ on left. The sensation is intact to a screening 5.07, 10 gram monofilament bilaterally   DATA REVIEWED:  Lab Results  Component Value Date   HGBA1C 7.6 (A) 12/24/2020   HGBA1C 7.4 (A)  08/20/2020   HGBA1C 7.2 (H) 04/09/2020   Lab Results  Component Value Date   MICROALBUR <0.7 10/17/2020   LDLCALC 59 04/09/2020   CREATININE 0.71 12/11/2020   Lab Results  Component Value Date   MICRALBCREAT 2.8 10/17/2020    Lab Results  Component Value Date   CHOL 185 10/17/2020   HDL 53.80 10/17/2020   LDLCALC 59 04/09/2020   LDLDIRECT 101.0 10/17/2020   TRIG 222.0 (H) 10/17/2020   CHOLHDL 3 10/17/2020        ASSESSMENT / PLAN / RECOMMENDATIONS:   1) Type 2 Diabetes Mellitus, Sub-Optimally controlled, Without complications - Most recent A1c of 7.6 %. Goal A1c < 7.0 %.    A1c has trended up, this is because she stopped taking Metformin and only has been taking Jardiance. I have reviewed her last AVS instructions, which clearly indicated to continue with Metformin. We also discussed the ability to log in to the Portal and reading note /instructions and AVS Since she is not having any side effects to Jardiance, will increase as below  Restart Metformin   MEDICATIONS: Increase Jardiance 25 mg , 1 tablet in the morning  Restart  Metformin 500 mg, 2 tablets with Breakfast and 2 tablet with Supper    EDUCATION / INSTRUCTIONS: BG monitoring instructions: Patient is instructed to check her blood sugars 1 times a day, fasting . Call [facility] if: BG persistently < 70  I reviewed the Rule of 15 for the treatment of hypoglycemia in detail with the patient. Literature supplied.   2) Diabetic complications:  Eye: Does not have known diabetic retinopathy.  Neuro/ Feet: Does not have known diabetic peripheral neuropathy. Renal: Patient does not have known baseline CKD. She is not on an ACEI/ARB at present.  3) Mixed Dyslipidemia : Patient is  on Atorvastatin 10 mg daily. LDL at goal but Tg slightly elevated.      Continue Atorvastatin 10 mg daily      F/U in 3 months     Signed electronically  by: Mack Guise, MD  [facility] Endocrinology   [facility] [address] [place], [place] [address] Phone: [phone] FAX: [phone]   CC: Mosie Lukes, [address] [place] [address] Phone: [phone]  Fax: [phone]    Return to Endocrinology clinic as below: Future Appointments  Date Time Provider [place]  12/25/2020  1:30 PM Kyung Rudd, MD [facility] None  01/02/2021  8:45 AM Megan Salon, MD [facility]  01/15/2021  9:30 AM [facility] None  01/15/2021 10:00 AM Magrinat, Virgie Dad, MD [facility] None  04/15/2021  9:45 AM Gardenia Phlegm, NP [facility] None  05/12/2021 10:00 AM Mosie Lukes, MD [facility]

## 2020-12-24 ENCOUNTER — Ambulatory Visit (INDEPENDENT_AMBULATORY_CARE_PROVIDER_SITE_OTHER): Payer: 59 | Admitting: Internal Medicine

## 2020-12-24 ENCOUNTER — Other Ambulatory Visit: Payer: Self-pay

## 2020-12-24 ENCOUNTER — Encounter: Payer: Self-pay | Admitting: Internal Medicine

## 2020-12-24 VITALS — BP 138/74 | HR 74 | Ht 63.0 in | Wt 182.2 lb

## 2020-12-24 DIAGNOSIS — E1165 Type 2 diabetes mellitus with hyperglycemia: Secondary | ICD-10-CM | POA: Diagnosis not present

## 2020-12-24 DIAGNOSIS — E785 Hyperlipidemia, unspecified: Secondary | ICD-10-CM

## 2020-12-24 LAB — POCT GLYCOSYLATED HEMOGLOBIN (HGB A1C): Hemoglobin A1C: 7.6 % — AB (ref 4.0–5.6)

## 2020-12-24 MED ORDER — METFORMIN HCL ER 500 MG PO TB24: 1000.0000 mg | ORAL_TABLET | Freq: Two times a day (BID) | ORAL | 3 refills | Status: DC

## 2020-12-24 MED ORDER — EMPAGLIFLOZIN 25 MG PO TABS: 25.0000 mg | ORAL_TABLET | Freq: Every day | ORAL | 3 refills | Status: DC

## 2020-12-24 NOTE — Patient Instructions (Addendum)
-   Increase Jardiance 25 mg , 1 tablet in the morning  - Continue Metformin 500 mg, 2 tablets with Breakfast and 2 tablet with Supper    HOW TO TREAT LOW BLOOD SUGARS (Blood sugar LESS THAN 70 MG/DL) Please follow the RULE OF 15 for the treatment of hypoglycemia treatment (when your (blood sugars are less than 70 mg/dL)   STEP 1: Take 15 grams of carbohydrates when your blood sugar is low, which includes:  3-4 GLUCOSE TABS  OR 3-4 OZ OF JUICE OR REGULAR SODA OR ONE TUBE OF GLUCOSE GEL    STEP 2: RECHECK blood sugar in 15 MINUTES STEP 3: If your blood sugar is still low at the 15 minute recheck --> then, go back to STEP 1 and treat AGAIN with another 15 grams of carbohydrates.

## 2020-12-25 ENCOUNTER — Ambulatory Visit: Payer: 59 | Admitting: Radiation Oncology

## 2021-01-01 ENCOUNTER — Encounter: Payer: Self-pay | Admitting: *Deleted

## 2021-01-02 ENCOUNTER — Encounter (HOSPITAL_BASED_OUTPATIENT_CLINIC_OR_DEPARTMENT_OTHER): Payer: Self-pay | Admitting: Obstetrics & Gynecology

## 2021-01-02 ENCOUNTER — Other Ambulatory Visit (HOSPITAL_COMMUNITY)
Admission: RE | Admit: 2021-01-02 | Discharge: 2021-01-02 | Disposition: A | Discharge status: Home / Self Care | Payer: 59 | Source: Ambulatory Visit | Attending: Obstetrics & Gynecology | Admitting: Obstetrics & Gynecology

## 2021-01-02 ENCOUNTER — Ambulatory Visit (INDEPENDENT_AMBULATORY_CARE_PROVIDER_SITE_OTHER): Payer: 59 | Admitting: Obstetrics & Gynecology

## 2021-01-02 ENCOUNTER — Other Ambulatory Visit: Payer: Self-pay

## 2021-01-02 VITALS — BP 133/82 | HR 97 | Ht 63.0 in | Wt 177.8 lb

## 2021-01-02 DIAGNOSIS — Z86018 Personal history of other benign neoplasm: Secondary | ICD-10-CM

## 2021-01-02 DIAGNOSIS — K5900 Constipation, unspecified: Secondary | ICD-10-CM

## 2021-01-02 DIAGNOSIS — Z01419 Encounter for gynecological examination (general) (routine) without abnormal findings: Secondary | ICD-10-CM

## 2021-01-02 DIAGNOSIS — N841 Polyp of cervix uteri: Secondary | ICD-10-CM | POA: Insufficient documentation

## 2021-01-02 DIAGNOSIS — Z780 Asymptomatic menopausal state: Secondary | ICD-10-CM

## 2021-01-02 DIAGNOSIS — Z124 Encounter for screening for malignant neoplasm of cervix: Secondary | ICD-10-CM

## 2021-01-02 DIAGNOSIS — E119 Type 2 diabetes mellitus without complications: Secondary | ICD-10-CM

## 2021-01-02 DIAGNOSIS — C50411 Malignant neoplasm of upper-outer quadrant of right female breast: Secondary | ICD-10-CM

## 2021-01-02 DIAGNOSIS — Z170 Estrogen receptor positive status [ER+]: Secondary | ICD-10-CM

## 2021-01-02 NOTE — Progress Notes (Signed)
60 y.o. G0P0 Married Other or two or more races female here for annual exam.  Had breast cancer stage IIA diagnosed early this year.  Has 12 cycles of chemotherapy.  During chemotherapy, her wound opened.  She is packing it and is now much smaller.  Radiation was recommended but she cannot do this yet because wound is still open (but much smaller).    Has diabetes.  Is followed by Dr. Kelton Pillar.  HbA1C was 7.6.    Denies vaginal bleeding.    Having a lot of constipation that started after chemotherapy.  Cannot have a bowel movement if does not take a laxative.  Has used stool softeners, miralax (and she is taking it daily).  Would like some evaluation for this.    Patient's last menstrual period was 02/21/2014.          Sexually active: No.  The current method of family planning is post menopausal status.     The pregnancy intention screening data noted above was reviewed. Potential methods of contraception were discussed. The patient elected to proceed with No data recorded.  Exercising: yes.  Doing stretching and cardio Smoker:  no  Health Maintenance: Pap:  03/19/2017 negative History of abnormal Pap:  no MMG:  04/03/2020 Additional images recommended Colonoscopy:  11/15/2019 BMD:   10/12/2018 Screening Labs: done 10/2020   reports that she has never smoked. She has never used smokeless tobacco. She reports that she does not drink alcohol and does not use drugs.  Past Medical History:  Diagnosis Date   Asthma    with respiratory infection   Cancer ([place])    breast cancer - right   Diabetes mellitus without complication ([place])    type II   Fatty liver    Fibroid uterus    Hypertension    Patient reports that she has never been diagnosied with HTN, "I take HCTZ for swelling in my feet, if needed."   Osteoarthritis 03/10/2015   PONV (postoperative nausea and vomiting)    2014   Refusal of blood transfusions as patient is Jehovah's Witness    NO BLOOD PRODUCTS   Sleep apnea      Past Surgical History:  Procedure Laterality Date   BREAST CYST ASPIRATION     BREAST EXCISIONAL BIOPSY     BREAST LUMPECTOMY WITH SENTINEL LYMPH NODE BIOPSY Right 05/21/2020   Procedure: RIGHT BREAST LUMPECTOMY WITH SENTINEL LYMPH NODE BIOPSY;  Surgeon: Stark Klein, MD;  Location: [facility];  Service: General;  Laterality: Right;   CHOLECYSTECTOMY  1/11   COLONOSCOPY     DILATATION & CURRETTAGE/HYSTEROSCOPY WITH RESECTOCOPE N/A 03/13/2014   Procedure: DILATATION & CURETTAGE/HYSTEROSCOPY ;  Surgeon: Lyman Speller, MD;  Location: [facility];  Service: Gynecology;  Laterality: N/A;   PORTACATH PLACEMENT Left 05/21/2020   Procedure: INSERTION PORT-A-CATH;  Surgeon: Stark Klein, MD;  Location: [facility];  Service: General;  Laterality: Left;   RADIOACTIVE SEED GUIDED EXCISIONAL BREAST BIOPSY Left 12/30/2016   Procedure: RADIOACTIVE SEED GUIDED EXCISIONAL BREAST BIOPSY;  Surgeon: Stark Klein, MD;  Location: [facility];  Service: General;  Laterality: Left;    Current Outpatient Medications  Medication Sig Dispense Refill   albuterol (PROVENTIL HFA;VENTOLIN HFA) 108 (90 Base) MCG/ACT inhaler Inhale 1-2 puffs into the lungs every 4 (four) hours as needed for wheezing or shortness of breath. 18 g 2   atorvastatin (LIPITOR) 10 MG tablet Take 10 mg by mouth every other day.     B-D ULTRA-FINE 33  LANCETS MISC Check BG 2 times/day     empagliflozin (JARDIANCE) 25 MG TABS tablet Take 1 tablet (25 mg total) by mouth daily before breakfast. 90 tablet 3   fluticasone (FLONASE) 50 MCG/ACT nasal spray Place 1 spray into both nostrils daily as needed for allergies or rhinitis.     glucose blood test strip Check BG 2 times/day.  Dx E11.65     loratadine (CLARITIN) 10 MG tablet Take 1 tablet (10 mg total) by mouth daily. 90 tablet 0   metFORMIN (GLUCOPHAGE-XR) 500 MG 24 hr tablet Take 2 tablets (1,000 mg total) by mouth in the morning and at bedtime. 360 tablet 3   milk thistle 175 MG tablet  Take 175 mg by mouth every other day.     Multiple Vitamin (MULTIVITAMIN) LIQD Take 5 mLs by mouth daily.     Omega-3 Fatty Acids (OMEGA 3 PO) Take 1 capsule by mouth every other day.     omeprazole (PRILOSEC) 40 MG capsule Take 40 mg by mouth daily as needed (acid reflux).     vitamin C (ASCORBIC ACID) 500 MG tablet Take 500 mg by mouth every other day.     magic mouthwash w/lidocaine SOLN Take 5 mLs by mouth 4 (four) times daily as needed for mouth pain. (Patient not taking: Reported on 01/02/2021) 240 mL 1   No current facility-administered medications for this visit.   Facility-Administered Medications Ordered in Other Visits  Medication Dose Route Frequency Provider Last Rate Last Admin   sodium chloride flush (NS) 0.9 % injection 10 mL  10 mL Intracatheter PRN Magrinat, Virgie Dad, MD        Family History  Problem Relation Age of Onset   Asthma Mother    Diabetes Mother    Diabetes Father    Heart attack Father    Diabetes Brother    Diabetes Brother    Asthma Brother    Heart Problems Sister    Asthma Sister    Hypertension Sister    Heart disease Maternal Grandfather    Osteopenia Sister     Review of Systems  All other systems reviewed and are negative.  Exam:   BP 133/82 (BP Location: Right Arm, Patient Position: Sitting, Cuff Size: Large)   Pulse 97   Ht 5\' 3"  (1.6 m) Comment: reported  Wt 177 lb 12.8 oz (80.6 kg)   LMP 02/21/2014   BMI 31.50 kg/m   Height: 5\' 3"  (160 cm) (reported)  General appearance: alert, cooperative and appears stated age Head: Normocephalic, without obvious abnormality, atraumatic Neck: no adenopathy, supple, symmetrical, trachea midline and thyroid normal to inspection and palpation Lungs: clear to auscultation bilaterally Breasts: left breast without masses, skin changes, nipple discharge, LAD; right breast with dressing in place, no masses or other skin changes noted, no LAD Heart: regular rate and rhythm Abdomen: soft, non-tender;  bowel sounds normal; no masses,  no organomegaly Extremities: extremities normal, atraumatic, no cyanosis or edema Skin: Skin color, texture, turgor normal. No rashes or lesions Lymph nodes: Cervical, supraclavicular, and axillary nodes normal. No abnormal inguinal nodes palpated Neurologic: Grossly normal  Pelvic: External genitalia:  no lesions              Urethra:  normal appearing urethra with no masses, tenderness or lesions              Bartholins and Skenes: normal                 Vagina: normal  appearing vagina with normal color and no discharge, no lesions              Cervix: polyp noted (advised pt and removal recommended.  Polyp grasped with polyp forcep and twisted to remove at the base.  Minimal bleeding noted after removal.  Pt tolerated this well.  Consent was obtained.)              Pap taken: Yes.   Bimanual Exam:  Uterus:  normal size, contour, position, consistency, mobility, non-tender              Adnexa: normal adnexa and no mass, fullness, tenderness               Rectovaginal: Confirms               Anus:  normal sphincter tone, no lesions  Chaperone, Octaviano Batty, CMA, was present for exam.  Assessment/Plan: 1. Well woman exam with routine gynecological exam - pap and HR HPV obtained today - MMG 03/2020 with breast cancer diagnosis - colonoscopy 10/2019 - BMD 09/2018 - labs done 10/2020 - care gaps/vaccines reviewed/updated  2. Malignant neoplasm of upper-outer quadrant of right breast in female, estrogen receptor positive ([place])  3. Constipation, unspecified constipation type - desires to see new provider - Ambulatory referral to Gastroenterology  4. Postmenopausal - no HRT  5. History of uterine fibroid - uterus small on exam today  6. Cervical polyp - Surgical pathology( [place]/ [facility])  7.  Type 2 diabetes mellitus without complication, without long-term current use of insulin ([place])

## 2021-01-03 LAB — SURGICAL PATHOLOGY

## 2021-01-06 ENCOUNTER — Ambulatory Visit: Payer: 59 | Admitting: Radiation Oncology

## 2021-01-08 LAB — CYTOLOGY - PAP
Comment: NEGATIVE
Diagnosis: NEGATIVE
High risk HPV: NEGATIVE

## 2021-01-13 ENCOUNTER — Other Ambulatory Visit: Payer: Self-pay | Admitting: Internal Medicine

## 2021-01-14 ENCOUNTER — Encounter: Payer: Self-pay | Admitting: Gastroenterology

## 2021-01-14 NOTE — Progress Notes (Signed)
Yolanda [name]  [phone] Fax:([phone]) [phone]     ID: Yolanda [name] DOB: 26-Oct-1960  MR#: [id number]  GYI#:[id number]  Patient Care Team: Yolanda Lukes, MD as PCP - General (Family Medicine) Yolanda Bickers, MD as Consulting Physician (Infectious Diseases) Yolanda Salon, MD as Consulting Physician (Gynecology) Yolanda [name], Yolanda Dad, MD as Consulting Physician (Oncology) Yolanda Craver, MD as Consulting Physician (Gastroenterology) Yolanda Klein, MD as Consulting Physician (General Surgery) Yolanda Kaufmann, RN as Oncology Nurse Navigator Yolanda Germany, RN as Oncology Nurse Navigator Yolanda [name], OD (Optometry) Yolanda Cruel, MD OTHER MD:   CHIEF COMPLAINT: Estrogen receptor positive breast cancer  CURRENT TREATMENT: [adjuvant radiation]; anastrozole   INTERVAL HISTORY: Yolanda [name] returns today for follow up and treatment of her estrogen receptor positive breast cancer.    Her radiation therapy has been delayed secondary to an open breast wound.  This is not quite close but getting pretty close.  She is currently scheduled to follow up with Dr. Barry [name] on 01/17/2021 and for CT simulation on 01/21/2021.   REVIEW OF SYSTEMS: Yolanda [name] had an okay Thanksgiving but she says many of her family members had [place].  She herself has never had.  She has pain in the right ankle, around the ankle, but no swelling or erythema.  She wondered if it could be gout.  She has never had gout before.  She is also constipated.  This is a new problem for her.  She is already scheduled to see GI for evaluation.  In the meantime she is taking some senna.  A detailed review of systems was otherwise stable.   COVID 19 VACCINATION STATUS: fully vaccinated [employer]), with booster 12/2019   HISTORY OF CURRENT ILLNESS: From the original intake note:   I saw [name] in December 2018 for evaluation of atypical lobular hyperplasia.  We discussed various strategies regarding breast  cancer prevention at that time for her to consider.    More recently (around the time of the Super Bowl 2022) she found a lump in her right breast.  She underwent bilateral diagnostic mammography with tomography and right breast ultrasonography at [facility] on 04/10/2020 showing: breast density category C; palpable 3 cm mass in right breast at 12 o'clock; no enlarged adenopathy in right axilla.  Accordingly on the same day, she proceeded to biopsy of the right breast area in question. The pathology from this procedure ([id number]) showed: invasive ductal carcinoma, grade 3; ductal carcinoma in situ. Prognostic indicators significant for: estrogen receptor, 90% positive with moderate staining intensity and progesterone receptor, 0% negative. Proliferation marker Ki67 at 25%. HER2 equivocal by immunohistochemistry (2+), but negative by fluorescent in situ hybridization with a signals ratio 1.49 and number per cell 2.75.   Cancer Staging  Malignant neoplasm of upper-outer quadrant of right breast in female, estrogen receptor positive ([place]) Staging form: Breast, AJCC 8th Edition - Clinical: Stage IIA (cT2, cN0, cM0, G3, ER+, PR+, HER2-) - Signed by Yolanda Cruel, MD on 04/18/2020  The patient's subsequent history is as detailed below.   PAST MEDICAL HISTORY: Past Medical History:  Diagnosis Date   Asthma    with respiratory infection   Cancer ([place])    breast cancer - right   Diabetes mellitus without complication ([place])    type II   Fatty liver    Fibroid uterus    Hypertension    Patient reports that she has never been diagnosied with HTN, "I take HCTZ for swelling  in my feet, if needed."   Osteoarthritis 03/10/2015   PONV (postoperative nausea and vomiting)    2014   Refusal of blood transfusions as patient is Jehovah's Witness    NO BLOOD PRODUCTS   Sleep apnea     PAST SURGICAL HISTORY: Past Surgical History:  Procedure Laterality Date   BREAST CYST ASPIRATION      BREAST EXCISIONAL BIOPSY     BREAST LUMPECTOMY WITH SENTINEL LYMPH NODE BIOPSY Right 05/21/2020   Procedure: RIGHT BREAST LUMPECTOMY WITH SENTINEL LYMPH NODE BIOPSY;  Surgeon: Yolanda Klein, MD;  Location: [facility];  Service: General;  Laterality: Right;   CHOLECYSTECTOMY  1/11   COLONOSCOPY     DILATATION & CURRETTAGE/HYSTEROSCOPY WITH RESECTOCOPE N/A 03/13/2014   Procedure: DILATATION & CURETTAGE/HYSTEROSCOPY ;  Surgeon: Yolanda Speller, MD;  Location: [facility];  Service: Gynecology;  Laterality: N/A;   PORTACATH PLACEMENT Left 05/21/2020   Procedure: INSERTION PORT-A-CATH;  Surgeon: Yolanda Klein, MD;  Location: [facility];  Service: General;  Laterality: Left;   RADIOACTIVE SEED GUIDED EXCISIONAL BREAST BIOPSY Left 12/30/2016   Procedure: RADIOACTIVE SEED GUIDED EXCISIONAL BREAST BIOPSY;  Surgeon: Yolanda Klein, MD;  Location: [facility];  Service: General;  Laterality: Left;    FAMILY HISTORY: Family History  Problem Relation Age of Onset   Asthma Mother    Diabetes Mother    Diabetes Father    Heart attack Father    Diabetes Brother    Diabetes Brother    Asthma Brother    Heart Problems Sister    Asthma Sister    Hypertension Sister    Heart disease Maternal Grandfather    Osteopenia Sister   The patient's father died from heart disease at the age of 22.  The patient's mother is 58 years old as of March 2022.  The patient has 4 brothers and 2 sisters.  She is not aware of any history of cancer in her family.   GYNECOLOGIC HISTORY:  Patient's last menstrual period was 02/21/2014. Menarche: 60 years old GX P 0 LMP age 62 Contraceptive 1 year, no complications HRT no  Hysterectomy? no BSO? 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  Her husband Yolanda [name] works as a Marketing executive for [employer].  He has 2 children from a prior marriage, both living in [place].  One is traveling nurse.  The other 1 is a Administrator.  The patient is a  Restaurant manager, fast food.    ADVANCED DIRECTIVES: In the absence of any documentation to the contrary, the patient's spouse is their HCPOA.  The patient made it clear at her visit 04/18/2020 that she would not accept any blood transfusion or blood products for any reason including to stave off death   HEALTH MAINTENANCE: Social History   Tobacco Use   Smoking status: Never   Smokeless tobacco: Never  Vaping Use   Vaping Use: Never used  Substance Use Topics   Alcohol use: No   Drug use: No     Colonoscopy: 10/2019 (Dr. Collene Mares), recall 2026  PAP: 03/2017, negative  Bone density: 09/2018, -0.8   Allergies  Allergen Reactions   Lisinopril Shortness Of Breath and Cough    wheezing    Current Outpatient Medications  Medication Sig Dispense Refill   albuterol (PROVENTIL HFA;VENTOLIN HFA) 108 (90 Base) MCG/ACT inhaler Inhale 1-2 puffs into the lungs every 4 (four) hours as needed for wheezing or shortness of breath. 18 g 2  atorvastatin (LIPITOR) 10 MG tablet Take 10 mg by mouth every other day.     B-D ULTRA-FINE 33 LANCETS MISC Check BG 2 times/day     empagliflozin (JARDIANCE) 25 MG TABS tablet Take 1 tablet (25 mg total) by mouth daily before breakfast. 90 tablet 3   fluticasone (FLONASE) 50 MCG/ACT nasal spray Place 1 spray into both nostrils daily as needed for allergies or rhinitis.     loratadine (CLARITIN) 10 MG tablet Take 1 tablet (10 mg total) by mouth daily. 90 tablet 0   magic mouthwash w/lidocaine SOLN Take 5 mLs by mouth 4 (four) times daily as needed for mouth pain. (Patient not taking: Reported on 01/02/2021) 240 mL 1   metFORMIN (GLUCOPHAGE-XR) 500 MG 24 hr tablet Take 2 tablets (1,000 mg total) by mouth in the morning and at bedtime. 360 tablet 3   milk thistle 175 MG tablet Take 175 mg by mouth every other day.     Multiple Vitamin (MULTIVITAMIN) LIQD Take 5 mLs by mouth daily.     Omega-3 Fatty Acids (OMEGA 3 PO) Take 1 capsule by mouth every other day.     omeprazole  (PRILOSEC) 40 MG capsule Take 40 mg by mouth daily as needed (acid reflux).     ONETOUCH VERIO test strip CHECK BLOOD GLUCOSE DAILY 100 strip 3   vitamin C (ASCORBIC ACID) 500 MG tablet Take 500 mg by mouth every other day.     No current facility-administered medications for this visit.   Facility-Administered Medications Ordered in Other Visits  Medication Dose Route Frequency Provider Last Rate Last Admin   sodium chloride flush (NS) 0.9 % injection 10 mL  10 mL Intracatheter PRN [place] Zirkelbach, Yolanda Dad, MD       SUPPLEMENTS: Aside from the medications listed above, the patient takes a teaspoon of olive oil daily, 1000 mg of vitamin C every other day, sunflower lecithin 600 mg every other day, vitamin D3 5000 mg every other day, magnesium 200 mg every other day, milk thistle 150 mg every other day, a B complex vitamin (B6 and B12) every other day, as well as Osteo Bi-Flex weekly and fluticasone as needed   OBJECTIVE:  Vitals:   01/15/21 0958  BP: 139/75  Pulse: 68  Resp: 18  Temp: (!) 97.5 F (36.4 C)  SpO2: 98%      Body mass index is 31.66 kg/m.   Wt Readings from Last 3 Encounters:  01/15/21 178 lb 11.2 oz (81.1 kg)  01/02/21 177 lb 12.8 oz (80.6 kg)  12/24/20 182 lb 3.2 oz (82.6 kg)  ECOG FS:1 - Symptomatic but completely ambulatory  Sclerae unicteric, EOMs intact Wearing a mask No cervical or supraclavicular adenopathy Lungs no rales or rhonchi Heart regular rate and rhythm Abd soft, nontender, positive bowel sounds MSK no focal spinal tenderness, no upper extremity lymphedema Neuro: nonfocal, well oriented, appropriate affect Breasts: Deferred   LAB RESULTS:  CMP     Component Value Date/Time   NA 135 01/15/2021 0943   NA 138 11/04/2017 0000   K 3.8 01/15/2021 0943   CL 105 01/15/2021 0943   CO2 26 01/15/2021 0943   GLUCOSE 104 (H) 01/15/2021 0943   BUN 14 01/15/2021 0943   BUN 14 11/04/2017 0000   CREATININE 0.63 01/15/2021 0943   CREATININE 0.60  09/07/2014 0950   CALCIUM 9.7 01/15/2021 0943   CALCIUM 9.7 10/02/2020 1030   PROT 6.9 01/15/2021 0943   ALBUMIN 3.9 01/15/2021 0943   AST 26  01/15/2021 0943   ALT 27 01/15/2021 0943   ALKPHOS 144 (H) 01/15/2021 0943   BILITOT 0.5 01/15/2021 0943   GFRNONAA >60 01/15/2021 0943   GFRAA >60 05/15/2019 1803    No results found for: TOTALPROTELP, ALBUMINELP, A1GS, A2GS, BETS, BETA2SER, GAMS, MSPIKE, SPEI  Lab Results  Component Value Date   WBC 6.0 01/15/2021   NEUTROABS 3.1 01/15/2021   HGB 11.9 (L) 01/15/2021   HCT 37.4 01/15/2021   MCV 93.0 01/15/2021   PLT 255 01/15/2021    No results found for: LABCA2  No components found for: IRSWNI627  No results for input(s): INR in the last 168 hours.  No results found for: LABCA2  No results found for: OJJ009  No results found for: FGH829  No results found for: HBZ169  No results found for: CA2729  No components found for: HGQUANT  No results found for: CEA1 / No results found for: CEA1   No results found for: AFPTUMOR  No results found for: CHROMOGRNA  No results found for: KPAFRELGTCHN, LAMBDASER, KAPLAMBRATIO (kappa/lambda light chains)  No results found for: HGBA, HGBA2QUANT, HGBFQUANT, HGBSQUAN (Hemoglobinopathy evaluation)   No results found for: LDH  Lab Results  Component Value Date   IRON 38 (L) 10/16/2009   IRONPCTSAT 10.2 (L) 10/16/2009   (Iron and TIBC)  Lab Results  Component Value Date   FERRITIN 8.9 (L) 10/16/2009    Urinalysis    Component Value Date/Time   COLORURINE YELLOW 03/11/2009 1100   APPEARANCEUR CLEAR 03/11/2009 1100   LABSPEC 1.017 03/11/2009 1100   PHURINE 7.0 03/11/2009 1100   GLUCOSEU NEGATIVE 03/11/2009 1100   HGBUR LARGE (A) 03/11/2009 1100   BILIRUBINUR N 05/21/2015 1614   KETONESUR NEGATIVE 03/11/2009 1100   PROTEINUR N 05/21/2015 1614   PROTEINUR NEGATIVE 03/11/2009 1100   UROBILINOGEN negative 05/21/2015 1614   UROBILINOGEN 0.2 03/11/2009 1100   NITRITE N  05/21/2015 1614   NITRITE NEGATIVE 03/11/2009 1100   LEUKOCYTESUR Negative 05/21/2015 1614    STUDIES: No results found.   ELIGIBLE FOR AVAILABLE RESEARCH PROTOCOL: no  ASSESSMENT: 60 y.o. [place] woman status post right breast upper outer quadrant biopsy 04/10/2020 for a clinical T2N0, stage IIA invasive ductal carcinoma, grade 3, estrogen receptor positive, progesterone receptor and HER-2 negative, with an MIB-1 of 25%.  (1) Oncotype obtained from the original biopsy shows a score of 48, predicting a recurrence rate outside the breast within 9 years of 28% if the only systemic therapy is antiestrogen for 5 years.  It also predicts a greater than 15% benefit from chemotherapy.  (2) right lumpectomy and sentinel lymph node dissection 05/21/2020 shows a pT2 pN0, stage IIB invasive ductal carcinoma, grade 3, with negative margins.  (3) adjuvant chemotherapy to consist of cyclophosphamide and doxorubicin in dose dense fashion x4 starting 06/18/2020, with Neulasta day 3; followed by paclitaxel weekly x12 started 08/28/2020 through 11/13/2020  (a) echo 05/13/2020 shows an ejection fraction in the 55-60% range  (4) adjuvant radiation pending  (5) anastrozole started 01/15/2021   PLAN: Yolanda [name]'s radiation has been delayed because of healing issues.  At this point I think it would be prudent to start antiestrogens. We discussed the difference between tamoxifen and anastrozole in detail. She understands that anastrozole and the aromatase inhibitors in general work by blocking estrogen production. Accordingly vaginal dryness, decrease in bone density, and of course hot flashes can result. The aromatase inhibitors can also negatively affect the cholesterol profile, although that is a minor effect. One out of  5 women on aromatase inhibitors we will feel "old and achy". This arthralgia/myalgia syndrome, which resembles fibromyalgia clinically, does resolve with stopping the medications. Accordingly this  is not a reason to not try an aromatase inhibitor but it is a frequent reason to stop it (in other words 20% of women will not be able to tolerate these medications).  Tamoxifen on the other hand does not block estrogen production. It does not "take away a woman's estrogen". It blocks the estrogen receptor in breast cells. Like anastrozole, it can also cause hot flashes. As opposed to anastrozole, tamoxifen has many estrogen-like effects. It is technically an estrogen receptor modulator. This means that in some tissues tamoxifen works like estrogen-- for example it helps strengthen the bones. It tends to improve the cholesterol profile. It can cause thickening of the endometrial lining, and even endometrial polyps or rarely cancer of the uterus.(The risk of uterine cancer due to tamoxifen is one additional cancer per thousand women year). It can cause vaginal wetness or stickiness. It can cause blood clots through this estrogen-like effect--the risk of blood clots with tamoxifen is exactly the same as with birth control pills or hormone replacement.  Neither of these agents causes mood changes or weight gain, despite the popular belief that they can have these side effects. We have data from studies comparing either of these drugs with placebo, and in those cases the control group had the same amount of weight gain and depression as the group that took the drug.  Were going to give anastrozole a try.  She will see [place] again in about 3 months.  If she tolerates it well the plan would be to obtain a bone density with her next mammogram and then continue anastrozole 5 years.  If she has significant issues with the medication likely we would switch her to tamoxifen at the next visit.  I think the ankle issue is going to be arthritis.  She has clear arthritis in her hands.  It is not consistent with gout.  She knows to call for any other issue that may develop before the next visit. Total encounter time 25  minutes.Sarajane Jews C. Merrell Rettinger, MD 01/15/21 10:34 AM Medical Oncology and Hematology [facility] [place], [place] [address] Tel. [phone]    Fax. [phone]   I, Wilburn Mylar, am acting as scribe for Dr. Virgie [name]. Yolanda [name].  I, Lurline Del MD, have reviewed the above documentation for accuracy and completeness, and I agree with the above.  *Total Encounter Time as defined by the Centers for Medicare and Medicaid Services includes, in addition to the face-to-face time of a patient visit (documented in the note above) non-face-to-face time: obtaining and reviewing outside history, ordering and reviewing medications, tests or procedures, care coordination (communications with other health care professionals or caregivers) and documentation in the medical record.

## 2021-01-15 ENCOUNTER — Inpatient Hospital Stay: Payer: 59 | Attending: Oncology

## 2021-01-15 ENCOUNTER — Inpatient Hospital Stay (HOSPITAL_BASED_OUTPATIENT_CLINIC_OR_DEPARTMENT_OTHER): Payer: 59 | Admitting: Oncology

## 2021-01-15 ENCOUNTER — Other Ambulatory Visit: Payer: Self-pay

## 2021-01-15 ENCOUNTER — Telehealth: Payer: Self-pay | Admitting: Oncology

## 2021-01-15 VITALS — BP 139/75 | HR 68 | Temp 97.5°F | Resp 18 | Ht 63.0 in | Wt 178.7 lb

## 2021-01-15 DIAGNOSIS — Z170 Estrogen receptor positive status [ER+]: Secondary | ICD-10-CM | POA: Insufficient documentation

## 2021-01-15 DIAGNOSIS — C50411 Malignant neoplasm of upper-outer quadrant of right female breast: Secondary | ICD-10-CM

## 2021-01-15 DIAGNOSIS — E119 Type 2 diabetes mellitus without complications: Secondary | ICD-10-CM | POA: Insufficient documentation

## 2021-01-15 DIAGNOSIS — Z79811 Long term (current) use of aromatase inhibitors: Secondary | ICD-10-CM | POA: Insufficient documentation

## 2021-01-15 DIAGNOSIS — Z452 Encounter for adjustment and management of vascular access device: Secondary | ICD-10-CM | POA: Insufficient documentation

## 2021-01-15 DIAGNOSIS — Z95828 Presence of other vascular implants and grafts: Secondary | ICD-10-CM

## 2021-01-15 LAB — CBC WITH DIFFERENTIAL/PLATELET
Abs Immature Granulocytes: 0.02 10*3/uL (ref 0.00–0.07)
Basophils Absolute: 0 10*3/uL (ref 0.0–0.1)
Basophils Relative: 1 %
Eosinophils Absolute: 0.5 10*3/uL (ref 0.0–0.5)
Eosinophils Relative: 8 %
HCT: 37.4 % (ref 36.0–46.0)
Hemoglobin: 11.9 g/dL — ABNORMAL LOW (ref 12.0–15.0)
Immature Granulocytes: 0 %
Lymphocytes Relative: 29 %
Lymphs Abs: 1.8 10*3/uL (ref 0.7–4.0)
MCH: 29.6 pg (ref 26.0–34.0)
MCHC: 31.8 g/dL (ref 30.0–36.0)
MCV: 93 fL (ref 80.0–100.0)
Monocytes Absolute: 0.6 10*3/uL (ref 0.1–1.0)
Monocytes Relative: 10 %
Neutro Abs: 3.1 10*3/uL (ref 1.7–7.7)
Neutrophils Relative %: 52 %
Platelets: 255 10*3/uL (ref 150–400)
RBC: 4.02 MIL/uL (ref 3.87–5.11)
RDW: 15.1 % (ref 11.5–15.5)
WBC: 6 10*3/uL (ref 4.0–10.5)
nRBC: 0 % (ref 0.0–0.2)

## 2021-01-15 LAB — CMP (CANCER CENTER ONLY)
ALT: 27 U/L (ref 0–44)
AST: 26 U/L (ref 15–41)
Albumin: 3.9 g/dL (ref 3.5–5.0)
Alkaline Phosphatase: 144 U/L — ABNORMAL HIGH (ref 38–126)
Anion gap: 4 — ABNORMAL LOW (ref 5–15)
BUN: 14 mg/dL (ref 8–23)
CO2: 26 mmol/L (ref 22–32)
Calcium: 9.7 mg/dL (ref 8.9–10.3)
Chloride: 105 mmol/L (ref 98–111)
Creatinine: 0.63 mg/dL (ref 0.44–1.00)
GFR, Estimated: >60 mL/min (ref >60)
Glucose, Bld: 104 mg/dL — ABNORMAL HIGH (ref 70–99)
Potassium: 3.8 mmol/L (ref 3.5–5.1)
Sodium: 135 mmol/L (ref 135–145)
Total Bilirubin: 0.5 mg/dL (ref 0.3–1.2)
Total Protein: 6.9 g/dL (ref 6.5–8.1)

## 2021-01-15 MED ORDER — ANASTROZOLE 1 MG PO TABS: 1.0000 mg | ORAL_TABLET | Freq: Every day | ORAL | 4 refills | Status: DC

## 2021-01-15 MED ORDER — SODIUM CHLORIDE 0.9% FLUSH
10.0000 mL | INTRAVENOUS | Status: DC | PRN
  Administered 2021-01-15 (×2): 10 mL

## 2021-01-15 NOTE — Telephone Encounter (Signed)
Scheduled appointment per 11/30 los. Patient is aware. 

## 2021-01-16 ENCOUNTER — Encounter: Payer: Self-pay | Admitting: *Deleted

## 2021-01-16 HISTORY — PX: PORTA CATH REMOVAL: CATH118286

## 2021-01-21 ENCOUNTER — Ambulatory Visit: Payer: 59 | Admitting: Radiation Oncology

## 2021-01-28 ENCOUNTER — Encounter: Payer: Self-pay | Admitting: *Deleted

## 2021-02-03 ENCOUNTER — Ambulatory Visit: Payer: 59 | Admitting: Gastroenterology

## 2021-02-07 ENCOUNTER — Encounter: Payer: Self-pay | Admitting: Obstetrics & Gynecology

## 2021-02-20 ENCOUNTER — Other Ambulatory Visit: Payer: Self-pay

## 2021-02-20 ENCOUNTER — Ambulatory Visit
Admission: RE | Admit: 2021-02-20 | Discharge: 2021-02-20 | Disposition: A | Discharge status: Home / Self Care | Payer: 59 | Source: Ambulatory Visit | Attending: Radiation Oncology | Admitting: Radiation Oncology

## 2021-02-20 DIAGNOSIS — Z510 Encounter for antineoplastic radiation therapy: Secondary | ICD-10-CM | POA: Insufficient documentation

## 2021-02-20 DIAGNOSIS — C50411 Malignant neoplasm of upper-outer quadrant of right female breast: Secondary | ICD-10-CM | POA: Insufficient documentation

## 2021-02-25 ENCOUNTER — Other Ambulatory Visit: Payer: Self-pay | Admitting: Oncology

## 2021-02-25 DIAGNOSIS — Z510 Encounter for antineoplastic radiation therapy: Secondary | ICD-10-CM | POA: Diagnosis not present

## 2021-02-27 ENCOUNTER — Ambulatory Visit
Admission: RE | Admit: 2021-02-27 | Discharge: 2021-02-27 | Disposition: A | Discharge status: Home / Self Care | Payer: 59 | Source: Ambulatory Visit | Attending: Radiation Oncology | Admitting: Radiation Oncology

## 2021-02-27 ENCOUNTER — Encounter: Payer: Self-pay | Admitting: *Deleted

## 2021-02-27 ENCOUNTER — Other Ambulatory Visit: Payer: Self-pay

## 2021-02-27 DIAGNOSIS — Z510 Encounter for antineoplastic radiation therapy: Secondary | ICD-10-CM | POA: Diagnosis not present

## 2021-02-27 NOTE — Progress Notes (Signed)

## 2021-02-28 ENCOUNTER — Ambulatory Visit
Admission: RE | Admit: 2021-02-28 | Discharge: 2021-02-28 | Disposition: A | Discharge status: Home / Self Care | Payer: 59 | Source: Ambulatory Visit | Attending: Radiation Oncology | Admitting: Radiation Oncology

## 2021-02-28 DIAGNOSIS — C50411 Malignant neoplasm of upper-outer quadrant of right female breast: Secondary | ICD-10-CM

## 2021-02-28 DIAGNOSIS — Z170 Estrogen receptor positive status [ER+]: Secondary | ICD-10-CM

## 2021-02-28 DIAGNOSIS — Z510 Encounter for antineoplastic radiation therapy: Secondary | ICD-10-CM | POA: Diagnosis not present

## 2021-02-28 MED ORDER — ALRA NON-METALLIC DEODORANT (RAD-ONC)
1.0000 "application " | Freq: Once | TOPICAL | Status: AC
  Administered 2021-02-28: 1 via TOPICAL

## 2021-02-28 MED ORDER — RADIAPLEXRX EX GEL: Freq: Once | CUTANEOUS | Status: AC

## 2021-03-03 ENCOUNTER — Ambulatory Visit
Admission: RE | Admit: 2021-03-03 | Discharge: 2021-03-03 | Disposition: A | Discharge status: Home / Self Care | Payer: 59 | Source: Ambulatory Visit | Attending: Radiation Oncology | Admitting: Radiation Oncology

## 2021-03-03 ENCOUNTER — Other Ambulatory Visit: Payer: Self-pay

## 2021-03-03 DIAGNOSIS — Z510 Encounter for antineoplastic radiation therapy: Secondary | ICD-10-CM | POA: Diagnosis not present

## 2021-03-04 ENCOUNTER — Ambulatory Visit
Admission: RE | Admit: 2021-03-04 | Discharge: 2021-03-04 | Disposition: A | Discharge status: Home / Self Care | Payer: 59 | Source: Ambulatory Visit | Attending: Radiation Oncology | Admitting: Radiation Oncology

## 2021-03-04 DIAGNOSIS — Z510 Encounter for antineoplastic radiation therapy: Secondary | ICD-10-CM | POA: Diagnosis not present

## 2021-03-05 ENCOUNTER — Other Ambulatory Visit: Payer: Self-pay

## 2021-03-05 ENCOUNTER — Ambulatory Visit
Admission: RE | Admit: 2021-03-05 | Discharge: 2021-03-05 | Disposition: A | Discharge status: Home / Self Care | Payer: 59 | Source: Ambulatory Visit | Attending: Radiation Oncology | Admitting: Radiation Oncology

## 2021-03-05 DIAGNOSIS — Z510 Encounter for antineoplastic radiation therapy: Secondary | ICD-10-CM | POA: Diagnosis not present

## 2021-03-06 ENCOUNTER — Other Ambulatory Visit: Payer: Self-pay

## 2021-03-06 ENCOUNTER — Ambulatory Visit
Admission: RE | Admit: 2021-03-06 | Discharge: 2021-03-06 | Disposition: A | Discharge status: Home / Self Care | Payer: 59 | Source: Ambulatory Visit | Attending: Radiation Oncology | Admitting: Radiation Oncology

## 2021-03-06 DIAGNOSIS — Z510 Encounter for antineoplastic radiation therapy: Secondary | ICD-10-CM | POA: Diagnosis not present

## 2021-03-07 ENCOUNTER — Ambulatory Visit
Admission: RE | Admit: 2021-03-07 | Discharge: 2021-03-07 | Disposition: A | Discharge status: Home / Self Care | Payer: 59 | Source: Ambulatory Visit | Attending: Radiation Oncology | Admitting: Radiation Oncology

## 2021-03-07 DIAGNOSIS — Z510 Encounter for antineoplastic radiation therapy: Secondary | ICD-10-CM | POA: Diagnosis not present

## 2021-03-10 ENCOUNTER — Other Ambulatory Visit: Payer: Self-pay

## 2021-03-10 ENCOUNTER — Ambulatory Visit
Admission: RE | Admit: 2021-03-10 | Discharge: 2021-03-10 | Disposition: A | Discharge status: Home / Self Care | Payer: 59 | Source: Ambulatory Visit | Attending: Radiation Oncology | Admitting: Radiation Oncology

## 2021-03-10 DIAGNOSIS — Z510 Encounter for antineoplastic radiation therapy: Secondary | ICD-10-CM | POA: Diagnosis not present

## 2021-03-11 ENCOUNTER — Ambulatory Visit
Admission: RE | Admit: 2021-03-11 | Discharge: 2021-03-11 | Disposition: A | Discharge status: Home / Self Care | Payer: 59 | Source: Ambulatory Visit | Attending: Radiation Oncology | Admitting: Radiation Oncology

## 2021-03-11 DIAGNOSIS — Z510 Encounter for antineoplastic radiation therapy: Secondary | ICD-10-CM | POA: Diagnosis not present

## 2021-03-12 ENCOUNTER — Other Ambulatory Visit: Payer: Self-pay

## 2021-03-12 ENCOUNTER — Encounter: Payer: Self-pay | Admitting: Radiation Oncology

## 2021-03-12 ENCOUNTER — Ambulatory Visit
Admission: RE | Admit: 2021-03-12 | Discharge: 2021-03-12 | Disposition: A | Discharge status: Home / Self Care | Payer: 59 | Source: Ambulatory Visit | Attending: Radiation Oncology | Admitting: Radiation Oncology

## 2021-03-12 DIAGNOSIS — Z510 Encounter for antineoplastic radiation therapy: Secondary | ICD-10-CM | POA: Diagnosis not present

## 2021-03-13 ENCOUNTER — Ambulatory Visit
Admission: RE | Admit: 2021-03-13 | Discharge: 2021-03-13 | Disposition: A | Discharge status: Home / Self Care | Payer: 59 | Source: Ambulatory Visit | Attending: Radiation Oncology | Admitting: Radiation Oncology

## 2021-03-13 DIAGNOSIS — Z510 Encounter for antineoplastic radiation therapy: Secondary | ICD-10-CM | POA: Diagnosis not present

## 2021-03-14 ENCOUNTER — Ambulatory Visit
Admission: RE | Admit: 2021-03-14 | Discharge: 2021-03-14 | Disposition: A | Discharge status: Home / Self Care | Payer: 59 | Source: Ambulatory Visit | Attending: Radiation Oncology | Admitting: Radiation Oncology

## 2021-03-14 ENCOUNTER — Other Ambulatory Visit: Payer: Self-pay

## 2021-03-14 ENCOUNTER — Ambulatory Visit: Payer: 59 | Admitting: Radiation Oncology

## 2021-03-14 DIAGNOSIS — Z510 Encounter for antineoplastic radiation therapy: Secondary | ICD-10-CM | POA: Diagnosis not present

## 2021-03-16 ENCOUNTER — Encounter: Payer: Self-pay | Admitting: Oncology

## 2021-03-16 DIAGNOSIS — Z510 Encounter for antineoplastic radiation therapy: Secondary | ICD-10-CM | POA: Diagnosis not present

## 2021-03-16 NOTE — Progress Notes (Signed)
°  Radiation Oncology         [phone] ________________________________  Name: Yolanda [name] MRN: [id number]  Date: 03/12/2021  DOB: January 28, 1961  SIMULATION NOTE   NARRATIVE:  The patient underwent simulation today for ongoing radiation therapy.  The existing CT study set was employed for the purpose of virtual treatment planning.  The target and avoidance structures were reviewed and modified as necessary.  Treatment planning then occurred.  The radiation boost prescription was entered and confirmed.  A total of 3 complex treatment devices were fabricated in the form of multi-leaf collimators to shape radiation around the targets while maximally excluding nearby normal structures. I have requested : Isodose Plan.    PLAN:  This modified radiation beam arrangement is intended to continue the current radiation dose to an additional 8 Gy in 4 fractions for a total cumulative dose of 50.56 Gy.    ------------------------------------------------  Jodelle Gross, MD, PhD

## 2021-03-17 ENCOUNTER — Ambulatory Visit
Admission: RE | Admit: 2021-03-17 | Discharge: 2021-03-17 | Disposition: A | Discharge status: Home / Self Care | Payer: 59 | Source: Ambulatory Visit | Attending: Radiation Oncology | Admitting: Radiation Oncology

## 2021-03-17 ENCOUNTER — Other Ambulatory Visit: Payer: Self-pay

## 2021-03-17 DIAGNOSIS — Z510 Encounter for antineoplastic radiation therapy: Secondary | ICD-10-CM | POA: Diagnosis not present

## 2021-03-18 ENCOUNTER — Ambulatory Visit
Admission: RE | Admit: 2021-03-18 | Discharge: 2021-03-18 | Disposition: A | Discharge status: Home / Self Care | Payer: 59 | Source: Ambulatory Visit | Attending: Radiation Oncology | Admitting: Radiation Oncology

## 2021-03-18 DIAGNOSIS — Z510 Encounter for antineoplastic radiation therapy: Secondary | ICD-10-CM | POA: Diagnosis not present

## 2021-03-19 ENCOUNTER — Ambulatory Visit
Admission: RE | Admit: 2021-03-19 | Discharge: 2021-03-19 | Disposition: A | Discharge status: Home / Self Care | Payer: 59 | Source: Ambulatory Visit | Attending: Radiation Oncology | Admitting: Radiation Oncology

## 2021-03-19 ENCOUNTER — Other Ambulatory Visit: Payer: Self-pay

## 2021-03-19 DIAGNOSIS — C50411 Malignant neoplasm of upper-outer quadrant of right female breast: Secondary | ICD-10-CM | POA: Insufficient documentation

## 2021-03-19 DIAGNOSIS — Z510 Encounter for antineoplastic radiation therapy: Secondary | ICD-10-CM | POA: Insufficient documentation

## 2021-03-20 ENCOUNTER — Ambulatory Visit
Admission: RE | Admit: 2021-03-20 | Discharge: 2021-03-20 | Disposition: A | Discharge status: Home / Self Care | Payer: 59 | Source: Ambulatory Visit | Attending: Radiation Oncology | Admitting: Radiation Oncology

## 2021-03-20 DIAGNOSIS — Z510 Encounter for antineoplastic radiation therapy: Secondary | ICD-10-CM | POA: Diagnosis not present

## 2021-03-21 ENCOUNTER — Other Ambulatory Visit: Payer: Self-pay

## 2021-03-21 ENCOUNTER — Ambulatory Visit
Admission: RE | Admit: 2021-03-21 | Discharge: 2021-03-21 | Disposition: A | Discharge status: Home / Self Care | Payer: 59 | Source: Ambulatory Visit | Attending: Radiation Oncology | Admitting: Radiation Oncology

## 2021-03-21 DIAGNOSIS — Z510 Encounter for antineoplastic radiation therapy: Secondary | ICD-10-CM | POA: Diagnosis not present

## 2021-03-24 ENCOUNTER — Ambulatory Visit
Admission: RE | Admit: 2021-03-24 | Discharge: 2021-03-24 | Disposition: A | Discharge status: Home / Self Care | Payer: 59 | Source: Ambulatory Visit | Attending: Radiation Oncology | Admitting: Radiation Oncology

## 2021-03-24 ENCOUNTER — Other Ambulatory Visit: Payer: Self-pay

## 2021-03-24 DIAGNOSIS — Z510 Encounter for antineoplastic radiation therapy: Secondary | ICD-10-CM | POA: Diagnosis not present

## 2021-03-25 ENCOUNTER — Ambulatory Visit
Admission: RE | Admit: 2021-03-25 | Discharge: 2021-03-25 | Disposition: A | Discharge status: Home / Self Care | Payer: 59 | Source: Ambulatory Visit | Attending: Radiation Oncology | Admitting: Radiation Oncology

## 2021-03-25 DIAGNOSIS — Z510 Encounter for antineoplastic radiation therapy: Secondary | ICD-10-CM | POA: Diagnosis not present

## 2021-03-25 NOTE — Progress Notes (Signed)
°                                                                                                                                                          °  Patient Name: Yolanda [name] MRN: [id number] DOB: [birth year] Referring Physician: Penni Homans (Profile Not Attached) Date of Service: 03/26/2021 [facility], [place]                                                        End Of Treatment Note  Diagnoses: C50.411-Malignant neoplasm of upper-outer quadrant of right female breast  Cancer Staging: Stage IIA, pT2N0M0, grade 3, ER/PR positive invasive ductal carcinoma of the right breast.    Intent: Curative  Radiation Treatment Dates: 02/27/2021 through 03/26/2021 Site Technique Total Dose (Gy) Dose per Fx (Gy) Completed Fx Beam Energies  Breast, Right: Breast_R 3D 42.56/42.56 2.66 16/16 10X  Breast, Right: Breast_R_Bst 3D 8/8 2 4/4 10X   Narrative: The patient tolerated radiation therapy relatively well. She developed fatigue and anticipated skin changes in the treatment field.   Plan: The patient will receive a call in about one month from the radiation oncology department. She will continue follow up with Dr. Chryl Heck as well.   ________________________________________________    Carola Rhine, [facility]

## 2021-03-26 ENCOUNTER — Other Ambulatory Visit: Payer: Self-pay

## 2021-03-26 ENCOUNTER — Encounter: Payer: Self-pay | Admitting: Radiation Oncology

## 2021-03-26 ENCOUNTER — Ambulatory Visit
Admission: RE | Admit: 2021-03-26 | Discharge: 2021-03-26 | Disposition: A | Discharge status: Home / Self Care | Payer: 59 | Source: Ambulatory Visit | Attending: Radiation Oncology | Admitting: Radiation Oncology

## 2021-03-26 ENCOUNTER — Encounter: Payer: Self-pay | Admitting: Oncology

## 2021-03-26 DIAGNOSIS — Z510 Encounter for antineoplastic radiation therapy: Secondary | ICD-10-CM | POA: Diagnosis not present

## 2021-03-26 NOTE — Progress Notes (Signed)
°  Radiation Oncology         [phone] ________________________________  Name: Yolanda [name] MRN: [id number]  Date: 03/12/2021  DOB: [birth year]  SIMULATION NOTE   NARRATIVE:  The patient underwent simulation today for ongoing radiation therapy.  The existing CT study set was employed for the purpose of virtual treatment planning.  The target and avoidance structures were reviewed and modified as necessary.  Treatment planning then occurred.  The radiation boost prescription was entered and confirmed.  A total of 3 complex treatment devices were fabricated in the form of multi-leaf collimators to shape radiation around the targets while maximally excluding nearby normal structures. I have requested : Isodose Plan.    PLAN:  This modified radiation beam arrangement is intended to continue the current radiation dose to an additional 8 Gy in 4 fractions for a total cumulative dose of 50.56 Gy.    ------------------------------------------------  Jodelle Gross, MD, PhD

## 2021-03-27 ENCOUNTER — Encounter: Payer: Self-pay | Admitting: *Deleted

## 2021-03-27 ENCOUNTER — Encounter: Payer: Self-pay | Admitting: Oncology

## 2021-03-27 DIAGNOSIS — C50411 Malignant neoplasm of upper-outer quadrant of right female breast: Secondary | ICD-10-CM

## 2021-03-27 DIAGNOSIS — Z170 Estrogen receptor positive status [ER+]: Secondary | ICD-10-CM

## 2021-04-01 ENCOUNTER — Telehealth: Payer: Self-pay | Admitting: *Deleted

## 2021-04-01 NOTE — Telephone Encounter (Signed)
Spoke with the patient who had concerns regarding skin irritation and peeling under her breast.  She reports that the skin has now split and is painful.  She was encouraged to use some neosporin with pain relief to this areas as it is not unusual for the skin under the breast to become irritated and open up.  She was advised to allow the skin to get some air while at home and to cover the area with a soft cloth or non adherent dressing when wearing clothing.  She verbalized understanding.    Gloriajean Dell. Leonie Green, BSN

## 2021-04-10 ENCOUNTER — Telehealth: Payer: Self-pay | Admitting: *Deleted

## 2021-04-10 NOTE — Telephone Encounter (Signed)
This RN received call from the patient stating ongoing " itching " in breast since completing radiation 10 days ago.  She is currently using radiaplex and cortisone cream.  Per phone discussion- she states skin is intact but she very irritated with redness and noted rash ( which she is unsure is from scratching the breast )  Per discussion - she will use in addition to above- aquaphor, and or coconut oil and mix in a pea size amount of benadryl cream. Discussed goal is to keep skin moist while healing as well as to use cool compresses ( discussed use of cabbage leaves for benefit).  If itching is bothering her at night- and interfering with sleep she can use oral benadryl.  Pt will  institute above recommendations and is scheduled for Rad Onc follow up on 04/14/2021.  This message will be forwarded to [place] provider for review of communication.

## 2021-04-14 ENCOUNTER — Other Ambulatory Visit: Payer: Self-pay

## 2021-04-14 ENCOUNTER — Inpatient Hospital Stay: Payer: 59

## 2021-04-14 ENCOUNTER — Encounter: Payer: Self-pay | Admitting: Hematology and Oncology

## 2021-04-14 ENCOUNTER — Inpatient Hospital Stay: Payer: 59 | Attending: Oncology | Admitting: Hematology and Oncology

## 2021-04-14 VITALS — BP 148/96 | HR 83 | Temp 98.1°F | Resp 18 | Ht 63.0 in | Wt 179.0 lb

## 2021-04-14 DIAGNOSIS — Z170 Estrogen receptor positive status [ER+]: Secondary | ICD-10-CM

## 2021-04-14 DIAGNOSIS — Z923 Personal history of irradiation: Secondary | ICD-10-CM | POA: Insufficient documentation

## 2021-04-14 DIAGNOSIS — C50411 Malignant neoplasm of upper-outer quadrant of right female breast: Secondary | ICD-10-CM

## 2021-04-14 DIAGNOSIS — Z79811 Long term (current) use of aromatase inhibitors: Secondary | ICD-10-CM | POA: Insufficient documentation

## 2021-04-14 DIAGNOSIS — Z79899 Other long term (current) drug therapy: Secondary | ICD-10-CM | POA: Diagnosis not present

## 2021-04-14 DIAGNOSIS — Z7984 Long term (current) use of oral hypoglycemic drugs: Secondary | ICD-10-CM | POA: Insufficient documentation

## 2021-04-14 LAB — CBC WITH DIFFERENTIAL/PLATELET
Abs Immature Granulocytes: 0.01 10*3/uL (ref 0.00–0.07)
Basophils Absolute: 0 10*3/uL (ref 0.0–0.1)
Basophils Relative: 1 %
Eosinophils Absolute: 0.3 10*3/uL (ref 0.0–0.5)
Eosinophils Relative: 5 %
HCT: 39.2 % (ref 36.0–46.0)
Hemoglobin: 12.3 g/dL (ref 12.0–15.0)
Immature Granulocytes: 0 %
Lymphocytes Relative: 26 %
Lymphs Abs: 1.6 10*3/uL (ref 0.7–4.0)
MCH: 28.9 pg (ref 26.0–34.0)
MCHC: 31.4 g/dL (ref 30.0–36.0)
MCV: 92 fL (ref 80.0–100.0)
Monocytes Absolute: 0.7 10*3/uL (ref 0.1–1.0)
Monocytes Relative: 11 %
Neutro Abs: 3.6 10*3/uL (ref 1.7–7.7)
Neutrophils Relative %: 57 %
Platelets: 234 10*3/uL (ref 150–400)
RBC: 4.26 MIL/uL (ref 3.87–5.11)
RDW: 15.9 % — ABNORMAL HIGH (ref 11.5–15.5)
WBC: 6.2 10*3/uL (ref 4.0–10.5)
nRBC: 0 % (ref 0.0–0.2)

## 2021-04-14 LAB — COMPREHENSIVE METABOLIC PANEL
ALT: 24 U/L (ref 0–44)
AST: 21 U/L (ref 15–41)
Albumin: 4 g/dL (ref 3.5–5.0)
Alkaline Phosphatase: 158 U/L — ABNORMAL HIGH (ref 38–126)
Anion gap: 6 (ref 5–15)
BUN: 17 mg/dL (ref 8–23)
CO2: 28 mmol/L (ref 22–32)
Calcium: 10.3 mg/dL (ref 8.9–10.3)
Chloride: 106 mmol/L (ref 98–111)
Creatinine, Ser: 0.71 mg/dL (ref 0.44–1.00)
GFR, Estimated: >60 mL/min (ref >60)
Glucose, Bld: 116 mg/dL — ABNORMAL HIGH (ref 70–99)
Potassium: 4.3 mmol/L (ref 3.5–5.1)
Sodium: 140 mmol/L (ref 135–145)
Total Bilirubin: 0.2 mg/dL — ABNORMAL LOW (ref 0.3–1.2)
Total Protein: 6.9 g/dL (ref 6.5–8.1)

## 2021-04-14 NOTE — Progress Notes (Signed)
[place]  [phone] [phone]     ID: Yolanda [name] DOB: 24-Mar-1960  MR#: [id number]  ZTI#:[id number]  Patient Care Team: Mosie Lukes, MD as PCP - General (Family Medicine) Michel Bickers, MD as Consulting Physician (Infectious Diseases) Megan Salon, MD as Consulting Physician (Gynecology) Magrinat, Virgie Dad, MD (Inactive) as Consulting Physician (Oncology) Juanita Craver, MD as Consulting Physician (Gastroenterology) Stark Klein, MD as Consulting Physician (General Surgery) Mauro Kaufmann, RN as Oncology Nurse Navigator Rockwell Germany, RN as Oncology Nurse Navigator Amedeo Kinsman, OD (Optometry) Benay Pike, MD OTHER MD:   CHIEF COMPLAINT: Estrogen receptor positive breast cancer  CURRENT TREATMENT: [adjuvant radiation]; anastrozole   INTERVAL HISTORY: Yolanda [name] returns today for follow up and treatment of her estrogen receptor positive breast cancer.   According to her last discussion with Dr. Jana Hakim, she was supposed to start anastrozole but apparently she has not started it because she was very worried about the effect of this medication on her bone density. She completed radiation about 2 weeks ago.  She has been recovering well from radiation.  She otherwise denies any new health complaints.  Rest of the pertinent 10 point ROS reviewed and negative.  REVIEW OF SYSTEMS: A detailed review of systems was otherwise stable.   COVID 19 VACCINATION STATUS: fully vaccinated [employer]), with booster 12/2019   HISTORY OF CURRENT ILLNESS: From the original intake note:   I saw Yolanda [name] in December 2018 for evaluation of atypical lobular hyperplasia.  We discussed various strategies regarding breast cancer prevention at that time for her to consider.    More recently (around the time of the Super Bowl 2022) she found a lump in her right breast.  She underwent bilateral diagnostic mammography with tomography and right breast  ultrasonography at [facility] on 04/10/2020 showing: breast density category C; palpable 3 cm mass in right breast at 12 o'clock; no enlarged adenopathy in right axilla.  Accordingly on the same day, she proceeded to biopsy of the right breast area in question. The pathology from this procedure ([id number]) showed: invasive ductal carcinoma, grade 3; ductal carcinoma in situ. Prognostic indicators significant for: estrogen receptor, 90% positive with moderate staining intensity and progesterone receptor, 0% negative. Proliferation marker Ki67 at 25%. HER2 equivocal by immunohistochemistry (2+), but negative by fluorescent in situ hybridization with a signals ratio 1.49 and number per cell 2.75.   Cancer Staging  Malignant neoplasm of upper-outer quadrant of right breast in female, estrogen receptor positive ([place]) Staging form: Breast, AJCC 8th Edition - Clinical: Stage IIA (cT2, cN0, cM0, G3, ER+, PR+, HER2-) - Signed by Chauncey Cruel, MD on 04/18/2020   The patient's subsequent history is as detailed below.   PAST MEDICAL HISTORY: Past Medical History:  Diagnosis Date   Asthma    with respiratory infection   Cancer ([place])    breast cancer - right   Diabetes mellitus without complication ([place])    type II   Fatty liver    Fibroid uterus    Hypertension    Patient reports that she has never been diagnosied with HTN, "I take HCTZ for swelling in my feet, if needed."   Osteoarthritis 03/10/2015   PONV (postoperative nausea and vomiting)    2014   Refusal of blood transfusions as patient is Jehovah's Witness    NO BLOOD PRODUCTS   Sleep apnea     PAST SURGICAL HISTORY: Past Surgical History:  Procedure Laterality Date  BREAST CYST ASPIRATION     BREAST EXCISIONAL BIOPSY     BREAST LUMPECTOMY WITH SENTINEL LYMPH NODE BIOPSY Right 05/21/2020   Procedure: RIGHT BREAST LUMPECTOMY WITH SENTINEL LYMPH NODE BIOPSY;  Surgeon: Stark Klein, MD;  Location: [facility];  Service: General;   Laterality: Right;   CHOLECYSTECTOMY  1/11   COLONOSCOPY     DILATATION & CURRETTAGE/HYSTEROSCOPY WITH RESECTOCOPE N/A 03/13/2014   Procedure: DILATATION & CURETTAGE/HYSTEROSCOPY ;  Surgeon: Lyman Speller, MD;  Location: [facility];  Service: Gynecology;  Laterality: N/A;   PORTACATH PLACEMENT Left 05/21/2020   Procedure: INSERTION PORT-A-CATH;  Surgeon: Stark Klein, MD;  Location: [facility];  Service: General;  Laterality: Left;   RADIOACTIVE SEED GUIDED EXCISIONAL BREAST BIOPSY Left 12/30/2016   Procedure: RADIOACTIVE SEED GUIDED EXCISIONAL BREAST BIOPSY;  Surgeon: Stark Klein, MD;  Location: [facility];  Service: General;  Laterality: Left;    FAMILY HISTORY: Family History  Problem Relation Age of Onset   Asthma Mother    Diabetes Mother    Diabetes Father    Heart attack Father    Diabetes Brother    Diabetes Brother    Asthma Brother    Heart Problems Sister    Asthma Sister    Hypertension Sister    Heart disease Maternal Grandfather    Osteopenia Sister   The patient's father died from heart disease at the age of 39.  The patient's mother is 80 years old as of March 2022.  The patient has 4 brothers and 2 sisters.  She is not aware of any history of cancer in her family.   GYNECOLOGIC HISTORY:  Patient's last menstrual period was 02/21/2014. Menarche: 61 years old GX P 0 LMP age 69 Contraceptive 1 year, no complications HRT no  Hysterectomy? no BSO? 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  Her husband [name] works as a Marketing executive for [employer].  He has 2 children from a prior marriage, both living in [place].  One is traveling nurse.  The other 1 is a Administrator.  The patient is a Restaurant manager, fast food.    ADVANCED DIRECTIVES: In the absence of any documentation to the contrary, the patient's spouse is their HCPOA.  The patient made it clear at her visit 04/18/2020 that she would not accept any blood transfusion or  blood products for any reason including to stave off death   HEALTH MAINTENANCE: Social History   Tobacco Use   Smoking status: Never   Smokeless tobacco: Never  Vaping Use   Vaping Use: Never used  Substance Use Topics   Alcohol use: No   Drug use: No     Colonoscopy: 10/2019 (Dr. Collene Mares), recall 2026  PAP: 03/2017, negative  Bone density: 09/2018, -0.8   Allergies  Allergen Reactions   Lisinopril Shortness Of Breath and Cough    wheezing    Current Outpatient Medications  Medication Sig Dispense Refill   albuterol (PROVENTIL HFA;VENTOLIN HFA) 108 (90 Base) MCG/ACT inhaler Inhale 1-2 puffs into the lungs every 4 (four) hours as needed for wheezing or shortness of breath. 18 g 2   anastrozole (ARIMIDEX) 1 MG tablet Take 1 tablet (1 mg total) by mouth daily. 90 tablet 4   atorvastatin (LIPITOR) 10 MG tablet Take 10 mg by mouth every other day.     B-D ULTRA-FINE 33 LANCETS MISC Check BG 2 times/day     empagliflozin (JARDIANCE) 25 MG TABS tablet Take  1 tablet (25 mg total) by mouth daily before breakfast. 90 tablet 3   fluticasone (FLONASE) 50 MCG/ACT nasal spray Place 1 spray into both nostrils daily as needed for allergies or rhinitis.     loratadine (CLARITIN) 10 MG tablet TAKE 1 TABLET(10 MG) BY MOUTH DAILY 90 tablet 0   magic mouthwash w/lidocaine SOLN Take 5 mLs by mouth 4 (four) times daily as needed for mouth pain. (Patient not taking: Reported on 01/02/2021) 240 mL 1   metFORMIN (GLUCOPHAGE-XR) 500 MG 24 hr tablet Take 2 tablets (1,000 mg total) by mouth in the morning and at bedtime. 360 tablet 3   milk thistle 175 MG tablet Take 175 mg by mouth every other day.     Multiple Vitamin (MULTIVITAMIN) LIQD Take 5 mLs by mouth daily.     Omega-3 Fatty Acids (OMEGA 3 PO) Take 1 capsule by mouth every other day.     omeprazole (PRILOSEC) 40 MG capsule Take 40 mg by mouth daily as needed (acid reflux).     ONETOUCH VERIO test strip CHECK BLOOD GLUCOSE DAILY 100 strip 3   vitamin  C (ASCORBIC ACID) 500 MG tablet Take 500 mg by mouth every other day.     No current facility-administered medications for this visit.   Facility-Administered Medications Ordered in Other Visits  Medication Dose Route Frequency Provider Last Rate Last Admin   sodium chloride flush (NS) 0.9 % injection 10 mL  10 mL Intracatheter PRN Magrinat, Valentino Hue, MD       SUPPLEMENTS: Aside from the medications listed above, the patient takes a teaspoon of olive oil daily, 1000 mg of vitamin C every other day, sunflower lecithin 600 mg every other day, vitamin D3 5000 mg every other day, magnesium 200 mg every other day, milk thistle 150 mg every other day, a B complex vitamin (B6 and B12) every other day, as well as Osteo Bi-Flex weekly and fluticasone as needed   OBJECTIVE:  Vitals:   04/14/21 0827  BP: (!) 148/96  Pulse: 83  Resp: 18  Temp: 98.1 F (36.7 C)  SpO2: 97%      Body mass index is 31.71 kg/m.   Wt Readings from Last 3 Encounters:  04/14/21 179 lb (81.2 kg)  01/15/21 178 lb 11.2 oz (81.1 kg)  01/02/21 177 lb 12.8 oz (80.6 kg)  ECOG FS:1 - Symptomatic but completely ambulatory  Physical examination: Right breast wound appears to be healing, small area of erythema around the wound.  However there is no evidence of infection.   LAB RESULTS:  CMP     Component Value Date/Time   NA 135 01/15/2021 0943   NA 138 11/04/2017 0000   K 3.8 01/15/2021 0943   CL 105 01/15/2021 0943   CO2 26 01/15/2021 0943   GLUCOSE 104 (H) 01/15/2021 0943   BUN 14 01/15/2021 0943   BUN 14 11/04/2017 0000   CREATININE 0.63 01/15/2021 0943   CREATININE 0.60 09/07/2014 0950   CALCIUM 9.7 01/15/2021 0943   CALCIUM 9.7 10/02/2020 1030   PROT 6.9 01/15/2021 0943   ALBUMIN 3.9 01/15/2021 0943   AST 26 01/15/2021 0943   ALT 27 01/15/2021 0943   ALKPHOS 144 (H) 01/15/2021 0943   BILITOT 0.5 01/15/2021 0943   GFRNONAA >60 01/15/2021 0943   GFRAA >60 05/15/2019 1803    No results found for:  TOTALPROTELP, ALBUMINELP, A1GS, A2GS, BETS, BETA2SER, GAMS, MSPIKE, SPEI  Lab Results  Component Value Date   WBC 6.2 04/14/2021  NEUTROABS 3.6 04/14/2021   HGB 12.3 04/14/2021   HCT 39.2 04/14/2021   MCV 92.0 04/14/2021   PLT 234 04/14/2021    No results found for: LABCA2  No components found for: LXBWIO035  No results for input(s): INR in the last 168 hours.  No results found for: LABCA2  No results found for: DHR416  No results found for: LAG536  No results found for: IWO032  No results found for: CA2729  No components found for: HGQUANT  No results found for: CEA1 / No results found for: CEA1   No results found for: AFPTUMOR  No results found for: CHROMOGRNA  No results found for: KPAFRELGTCHN, LAMBDASER, KAPLAMBRATIO (kappa/lambda light chains)  No results found for: HGBA, HGBA2QUANT, HGBFQUANT, HGBSQUAN (Hemoglobinopathy evaluation)   No results found for: LDH  Lab Results  Component Value Date   IRON 38 (L) 10/16/2009   IRONPCTSAT 10.2 (L) 10/16/2009   (Iron and TIBC)  Lab Results  Component Value Date   FERRITIN 8.9 (L) 10/16/2009    Urinalysis    Component Value Date/Time   COLORURINE YELLOW 03/11/2009 1100   APPEARANCEUR CLEAR 03/11/2009 1100   LABSPEC 1.017 03/11/2009 1100   PHURINE 7.0 03/11/2009 1100   GLUCOSEU NEGATIVE 03/11/2009 1100   HGBUR LARGE (A) 03/11/2009 1100   BILIRUBINUR N 05/21/2015 1614   KETONESUR NEGATIVE 03/11/2009 1100   PROTEINUR N 05/21/2015 1614   PROTEINUR NEGATIVE 03/11/2009 1100   UROBILINOGEN negative 05/21/2015 1614   UROBILINOGEN 0.2 03/11/2009 1100   NITRITE N 05/21/2015 1614   NITRITE NEGATIVE 03/11/2009 1100   LEUKOCYTESUR Negative 05/21/2015 1614    STUDIES: No results found.   ELIGIBLE FOR AVAILABLE RESEARCH PROTOCOL: no  ASSESSMENT: 61 y.o. [place] woman status post right breast upper outer quadrant biopsy 04/10/2020 for a clinical T2N0, stage IIA invasive ductal carcinoma, grade 3,  estrogen receptor positive, progesterone receptor and HER-2 negative, with an MIB-1 of 25%.  (1) Oncotype obtained from the original biopsy shows a score of 48, predicting a recurrence rate outside the breast within 9 years of 28% if the only systemic therapy is antiestrogen for 5 years.  It also predicts a greater than 15% benefit from chemotherapy.  (2) right lumpectomy and sentinel lymph node dissection 05/21/2020 shows a pT2 pN0, stage IIB invasive ductal carcinoma, grade 3, with negative margins.  (3) adjuvant chemotherapy to consist of cyclophosphamide and doxorubicin in dose dense fashion x4 starting 06/18/2020, with Neulasta day 3; followed by paclitaxel weekly x12 started 08/28/2020 through 11/13/2020  (a) echo 05/13/2020 shows an ejection fraction in the 55-60% range  (4) adjuvant radiation 03/27/2020    PLAN:  During his last visit, Dr. Jana Hakim has recommended that patient start anastrozole after discussing about available options.  However patient has not started this because she was very worried about the effect of the medication on the bone density.  She apparently read on the Internet that the effect on the bones can start as soon as 24 hours after taking the medication. She is again reluctant to even try tamoxifen because of the side effects from tamoxifen. I have once again gone over the Oncotype score which showed score of 48 predicting a recurrence rate outside the breast within 9 years of almost 28% provided she takes antiestrogen therapy for 5 years. Without antiestrogen therapy that risk is even higher.  I tried to discuss that when this breast cancer returns, it can return outside the breast which can make it for stage IV breast cancer  which at that point is not curable. At this time, I believe that her benefits from antiestrogen therapy significantly outweigh her risks. We have discussed once again about tamoxifen, mechanism of action of tamoxifen, adverse effects of  tamoxifen including risk of blood clots, endometrial cancer, increased risk of cardiovascular events, effect on bone density etc.  We have once again discussed about anastrozole which was recommended by Dr. Jana Hakim, mechanism of action, effect on bone density, arthralgias, myalgias which have been in 1 out of 5 people who take this medication, questionably increased risk of cardiovascular events. I have encouraged her to try the anastrozole, return to clinic with [place] in 3 months to review the toxicity and then we can try other options if she cannot tolerate anastrozole.  She expressed understanding of the recommendations.  She expressed understanding of the importance of antiestrogen therapy.  She will start it today. She was also asked to watch out for any infection in the wound, if she experiences severe pain, fevers, chills or purulent discharge then she needs to inform us immediately because the wound could be infected. She will otherwise return to clinic in 3 months. Mammogram due however given the wound, she might not be able to do it immediately, she will schedule it during summer. Bone density will be done around the same time, last bone density was in 2020. Total encounter time 30 minutes.*   *Total Encounter Time as defined by the Centers for Medicare and Medicaid Services includes, in addition to the face-to-face time of a patient visit (documented in the note above) non-face-to-face time: obtaining and reviewing outside history, ordering and reviewing medications, tests or procedures, care coordination (communications with other health care professionals or caregivers) and documentation in the medical record.

## 2021-04-15 ENCOUNTER — Encounter: Payer: 59 | Admitting: Adult Health

## 2021-04-16 ENCOUNTER — Other Ambulatory Visit: Payer: Self-pay | Admitting: Hematology and Oncology

## 2021-04-16 DIAGNOSIS — Z13820 Encounter for screening for osteoporosis: Secondary | ICD-10-CM

## 2021-04-21 ENCOUNTER — Other Ambulatory Visit: Payer: Self-pay

## 2021-04-21 ENCOUNTER — Ambulatory Visit
Admission: RE | Admit: 2021-04-21 | Discharge: 2021-04-21 | Disposition: A | Discharge status: Home / Self Care | Payer: 59 | Source: Ambulatory Visit | Attending: Hematology and Oncology | Admitting: Hematology and Oncology

## 2021-04-21 DIAGNOSIS — C50411 Malignant neoplasm of upper-outer quadrant of right female breast: Secondary | ICD-10-CM

## 2021-04-21 DIAGNOSIS — Z170 Estrogen receptor positive status [ER+]: Secondary | ICD-10-CM

## 2021-04-21 NOTE — Progress Notes (Addendum)
?  Radiation Oncology         [phone] ?________________________________ ? ?Name: Yolanda [name] MRN: [id number]  ?Date of Service: 04/21/2021  DOB: 03-08-1960 ? ?Post Treatment Telephone Note ? ?Diagnosis:   Stage IIA, pT2N0M0, grade 3, ER/PR positive invasive ductal carcinoma of the right breast. ?  ? ?Intent: Curative ? ?Radiation Treatment Dates: 02/27/2021 through 03/26/2021 ?Site Technique Total Dose (Gy) Dose per Fx (Gy) Completed Fx Beam Energies  ?Breast, Right: Breast_R 3D 42.56/42.56 2.66 16/16 10X  ?Breast, Right: Breast_R_Bst 3D 8/8 2 4/4 10X  ? ?Narrative: The patient tolerated radiation therapy relatively well. She developed fatigue and anticipated skin changes in the treatment field.  ? ? ?Impression/Plan: ?1. Stage IIA, pT2N0M0, grade 3, ER/PR positive invasive ductal carcinoma of the right breast.  I was unable to reach the patient but left a voicemail and on the message, I discussed that we would be happy to continue to follow her as needed, but she will also continue to follow up with Dr. Chryl Heck in medical oncology. She was counseled to call if she had questions about skin care or measures to avoid sun exposure to this area.   ? ? ? ? ? ?Carola Rhine, PAC  ? ? ? ? ?

## 2021-04-21 NOTE — Progress Notes (Unsigned)
Name: Yolanda [name]  MRN/ DOB: [id number], March 19, 1960   Age/ Sex: 61 y.o., female    PCP: Mosie Lukes, MD   Reason for Endocrinology Evaluation: Type 2 Diabetes Mellitus     Date of Initial Endocrinology Visit: 08/20/2020    PATIENT IDENTIFIER: Yolanda [name] is a 61 y.o. female with a past medical history of T2DM, Asthma, Breast ca and hx of Fatty liver. The patient presented for initial endocrinology clinic visit on 08/20/2020 for consultative assistance with her diabetes management.    Pt on adjuvant therapy for right Breast ca started 06/2020 ( cyclophosphamide, doxorubicin with pegfilgrastim on day 3 )but  she started weekly paclitaxel 08/21/2020.   DIABETIC HISTORY:  Yolanda [name] was diagnosed with DM in 2015. She has been on metformin only since her diagnosis.  Her hemoglobin A1c has ranged from 6.9% in 2019, peaking at 7.5% in 2020.   On her initial visit to our clinic she had an A1c of 7.4%, she was on metformin only, she has noted hyperglycemia during chemotherapy, we started Jardiance at the time.   SUBJECTIVE:   During the last visit (12/24/2020): A1c 7.6%, continued metformin and increased Jardiance  Today (12/24/2020): Yolanda [name] is here for follow-up on diabetes management.  She checks her blood sugars 1 times daily. The patient has not had hypoglycemic episodes since the last clinic visit.   She continues to follow up with oncology for breast CA,completer chemo 11/13/2020.  Completed radiation 03/2021. started anastrozole 03/2021   HOME DIABETES REGIMEN: Metformin 500 mg XR 2 tabs BID  Jardiance 25 mg daily  Statin: yes ACE-I/ARB: no- Allergic to lisinopril  Prior Diabetic Education: no   METER DOWNLOAD SUMMARY: Date range evaluated: 10/25-11/09/2020  Average Number Tests/Day = 0.6 Overall Mean FS Glucose = 146 Standard Deviation = 11  BG Ranges: Low = 132 High = 167   Hypoglycemic Events/30 Days: BG < 50 = 0 Episodes of symptomatic severe  hypoglycemia = 0   DIABETIC COMPLICATIONS: Microvascular complications:   Denies: CKD, neuropathy, retinopathy  Last eye exam: Completed 09/16/2020  Macrovascular complications:   Denies: CAD, PVD, CVA   PAST HISTORY: Past Medical History:  Past Medical History:  Diagnosis Date   Asthma    with respiratory infection   Cancer ([place])    breast cancer - right   Diabetes mellitus without complication ([place])    type II   Fatty liver    Fibroid uterus    Hypertension    Patient reports that she has never been diagnosied with HTN, "I take HCTZ for swelling in my feet, if needed."   Osteoarthritis 03/10/2015   PONV (postoperative nausea and vomiting)    2014   Refusal of blood transfusions as patient is Jehovah's Witness    NO BLOOD PRODUCTS   Sleep apnea    Past Surgical History:  Past Surgical History:  Procedure Laterality Date   BREAST CYST ASPIRATION     BREAST EXCISIONAL BIOPSY     BREAST LUMPECTOMY WITH SENTINEL LYMPH NODE BIOPSY Right 05/21/2020   Procedure: RIGHT BREAST LUMPECTOMY WITH SENTINEL LYMPH NODE BIOPSY;  Surgeon: Stark Klein, MD;  Location: [facility];  Service: General;  Laterality: Right;   CHOLECYSTECTOMY  1/11   COLONOSCOPY     DILATATION & CURRETTAGE/HYSTEROSCOPY WITH RESECTOCOPE N/A 03/13/2014   Procedure: DILATATION & CURETTAGE/HYSTEROSCOPY ;  Surgeon: Lyman Speller, MD;  Location: [facility];  Service: Gynecology;  Laterality: N/A;   PORTACATH PLACEMENT Left 05/21/2020  Procedure: INSERTION PORT-A-CATH;  Surgeon: Stark Klein, MD;  Location: [facility];  Service: General;  Laterality: Left;   RADIOACTIVE SEED GUIDED EXCISIONAL BREAST BIOPSY Left 12/30/2016   Procedure: RADIOACTIVE SEED GUIDED EXCISIONAL BREAST BIOPSY;  Surgeon: Stark Klein, MD;  Location: [facility];  Service: General;  Laterality: Left;    Social History:  reports that she has never smoked. She has never used smokeless tobacco. She reports that she does not drink alcohol and  does not use drugs. Family History:  Family History  Problem Relation Age of Onset   Asthma Mother    Diabetes Mother    Diabetes Father    Heart attack Father    Diabetes Brother    Diabetes Brother    Asthma Brother    Heart Problems Sister    Asthma Sister    Hypertension Sister    Heart disease Maternal Grandfather    Osteopenia Sister      HOME MEDICATIONS: Allergies as of 04/22/2021       Reactions   Lisinopril Shortness Of Breath, Cough   wheezing        Medication List        Accurate as of April 21, 2021  1:46 PM. If you have any questions, ask your nurse or doctor.          albuterol 108 (90 Base) MCG/ACT inhaler Commonly known as: VENTOLIN HFA Inhale 1-2 puffs into the lungs every 4 (four) hours as needed for wheezing or shortness of breath.   anastrozole 1 MG tablet Commonly known as: ARIMIDEX Take 1 tablet (1 mg total) by mouth daily.   atorvastatin 10 MG tablet Commonly known as: LIPITOR Take 10 mg by mouth every other day.   B-D ULTRA-FINE 33 LANCETS Misc Check BG 2 times/day   empagliflozin 25 MG Tabs tablet Commonly known as: Jardiance Take 1 tablet (25 mg total) by mouth daily before breakfast.   fluticasone 50 MCG/ACT nasal spray Commonly known as: FLONASE Place 1 spray into both nostrils daily as needed for allergies or rhinitis.   loratadine 10 MG tablet Commonly known as: CLARITIN TAKE 1 TABLET(10 MG) BY MOUTH DAILY   magic mouthwash w/lidocaine Soln Take 5 mLs by mouth 4 (four) times daily as needed for mouth pain.   metFORMIN 500 MG 24 hr tablet Commonly known as: GLUCOPHAGE-XR Take 2 tablets (1,000 mg total) by mouth in the morning and at bedtime.   milk thistle 175 MG tablet Take 175 mg by mouth every other day.   multivitamin Liqd Take 5 mLs by mouth daily.   OMEGA 3 PO Take 1 capsule by mouth every other day.   omeprazole 40 MG capsule Commonly known as: PRILOSEC Take 40 mg by mouth daily as needed (acid  reflux).   OneTouch Verio test strip Generic drug: glucose blood CHECK BLOOD GLUCOSE DAILY   vitamin C 500 MG tablet Commonly known as: ASCORBIC ACID Take 500 mg by mouth every other day.         ALLERGIES: Allergies  Allergen Reactions   Lisinopril Shortness Of Breath and Cough    wheezing     REVIEW OF SYSTEMS: A comprehensive ROS was conducted with the patient and is negative except as per HPI and below:  Review of Systems  Eyes:  Positive for blurred vision.  Gastrointestinal:  Negative for nausea and vomiting.  Neurological:  Positive for tingling.     OBJECTIVE:   VITAL SIGNS: LMP 02/21/2014    PHYSICAL  EXAM:  General: Pt appears well and is in NAD  Lungs: Clear with good BS bilat with no rales, rhonchi, or wheezes  Heart: RRR with normal S1 and S2 and no gallops; no murmurs; no rub  Abdomen: Normoactive bowel sounds, soft, nontender, without masses or organomegaly palpable  Extremities:  Lower extremities - No pretibial edema. No lesions.  Skin: Normal texture and temperature to palpation. No rash noted.   Neuro: MS is good with appropriate affect, pt is alert and Ox3    DM foot exam: 08/20/2020  The skin of the feet is intact without sores or ulcerations. The pedal pulses are 2+ on right and 2+ on left. The sensation is intact to a screening 5.07, 10 gram monofilament bilaterally   DATA REVIEWED:  Lab Results  Component Value Date   HGBA1C 7.6 (A) 12/24/2020   HGBA1C 7.4 (A) 08/20/2020   HGBA1C 7.2 (H) 04/09/2020   Lab Results  Component Value Date   MICROALBUR <0.7 10/17/2020   LDLCALC 59 04/09/2020   CREATININE 0.71 04/14/2021   Lab Results  Component Value Date   MICRALBCREAT 2.8 10/17/2020    Lab Results  Component Value Date   CHOL 185 10/17/2020   HDL 53.80 10/17/2020   LDLCALC 59 04/09/2020   LDLDIRECT 101.0 10/17/2020   TRIG 222.0 (H) 10/17/2020   CHOLHDL 3 10/17/2020        ASSESSMENT / PLAN / RECOMMENDATIONS:   1)  Type 2 Diabetes Mellitus, Sub-Optimally controlled, Without complications - Most recent A1c of 7.6 %. Goal A1c < 7.0 %.    A1c has trended up, this is because she stopped taking Metformin and only has been taking Jardiance. I have reviewed her last AVS instructions, which clearly indicated to continue with Metformin. We also discussed the ability to log in to the Portal and reading note /instructions and AVS Since she is not having any side effects to Jardiance, will increase as below  Restart Metformin   MEDICATIONS: Increase Jardiance 25 mg , 1 tablet in the morning  Restart  Metformin 500 mg, 2 tablets with Breakfast and 2 tablet with Supper    EDUCATION / INSTRUCTIONS: BG monitoring instructions: Patient is instructed to check her blood sugars 1 times a day, fasting . Call [facility] if: BG persistently < 70  I reviewed the Rule of 15 for the treatment of hypoglycemia in detail with the patient. Literature supplied.   2) Diabetic complications:  Eye: Does not have known diabetic retinopathy.  Neuro/ Feet: Does not have known diabetic peripheral neuropathy. Renal: Patient does not have known baseline CKD. She is not on an ACEI/ARB at present.  3) Mixed Dyslipidemia : Patient is  on Atorvastatin 10 mg daily. LDL at goal but Tg slightly elevated.      Continue Atorvastatin 10 mg daily      F/U in 3 months     Signed electronically by: Mack Guise, MD  [facility]  [address] [place], [place] [address] Phone: [phone] FAX: [phone]   CC: Mosie Lukes, [address] [place] [address] Phone: [phone]  Fax: [phone]    Return to Endocrinology clinic as below: Future Appointments  Date Time Provider [place]  04/22/2021  7:50 AM Arieliz Latino, Melanie Crazier, MD [facility] None  05/12/2021 10:00 AM Mosie Lukes, MD [facility]  07/17/2021   8:30 AM Benay Pike, MD [facility] None  09/15/2021  7:40 AM [facility] DIAG TOMO 1 [facility] GI-BREAST CE  09/15/2021  8:30 AM [facility] DX DEXA 1 [facility] GI-BREAST CE  01/12/2022  8:45 AM Megan Salon, MD [facility]

## 2021-04-22 ENCOUNTER — Ambulatory Visit: Payer: 59 | Admitting: Internal Medicine

## 2021-04-28 ENCOUNTER — Ambulatory Visit: Payer: 59

## 2021-05-09 ENCOUNTER — Encounter: Payer: Self-pay | Admitting: Internal Medicine

## 2021-05-09 ENCOUNTER — Ambulatory Visit (INDEPENDENT_AMBULATORY_CARE_PROVIDER_SITE_OTHER): Payer: 59 | Admitting: Internal Medicine

## 2021-05-09 ENCOUNTER — Other Ambulatory Visit: Payer: Self-pay

## 2021-05-09 VITALS — BP 124/80 | HR 99 | Ht 63.0 in | Wt 179.0 lb

## 2021-05-09 DIAGNOSIS — E119 Type 2 diabetes mellitus without complications: Secondary | ICD-10-CM | POA: Insufficient documentation

## 2021-05-09 DIAGNOSIS — R739 Hyperglycemia, unspecified: Secondary | ICD-10-CM

## 2021-05-09 LAB — POCT GLYCOSYLATED HEMOGLOBIN (HGB A1C): Hemoglobin A1C: 6.4 % — AB (ref 4.0–5.6)

## 2021-05-09 LAB — POCT GLUCOSE (DEVICE FOR HOME USE): Glucose Fasting, POC: 106 mg/dL — AB (ref 70–99)

## 2021-05-09 MED ORDER — METFORMIN HCL ER 500 MG PO TB24: 1000.0000 mg | ORAL_TABLET | Freq: Two times a day (BID) | ORAL | 3 refills | Status: DC

## 2021-05-09 MED ORDER — EMPAGLIFLOZIN 25 MG PO TABS: 25.0000 mg | ORAL_TABLET | Freq: Every day | ORAL | 3 refills | Status: DC

## 2021-05-09 NOTE — Patient Instructions (Addendum)
-   Keep Up the Good Work ! ?- Continue  Jardiance 25 mg , 1 tablet in the morning  ?- Continue Metformin 500 mg, 2 tablets with Breakfast and 2 tablet with Supper  ? ? ?HOW TO TREAT LOW BLOOD SUGARS (Blood sugar LESS THAN 70 MG/DL) ?Please follow the RULE OF 15 for the treatment of hypoglycemia treatment (when your (blood sugars are less than 70 mg/dL)  ? ?STEP 1: Take 15 grams of carbohydrates when your blood sugar is low, which includes:  ?3-4 GLUCOSE TABS  OR ?3-4 OZ OF JUICE OR REGULAR SODA OR ?ONE TUBE OF GLUCOSE GEL   ? ?STEP 2: RECHECK blood sugar in 15 MINUTES ?STEP 3: If your blood sugar is still low at the 15 minute recheck --> then, go back to STEP 1 and treat AGAIN with another 15 grams of carbohydrates. ? ?

## 2021-05-09 NOTE — Progress Notes (Signed)
?Name: Yolanda [name]  ?MRN/ DOB: [id number], [birth year]   ?Age/ Sex: 61 y.o., female   ? ?PCP: Mosie Lukes, MD   ?Reason for Endocrinology Evaluation: Type 2 Diabetes Mellitus  ?   ?Date of Initial Endocrinology Visit: 08/20/2020  ? ? ?PATIENT IDENTIFIER: Yolanda [name] is a 61 y.o. female with a past medical history of T2DM, Asthma, Breast ca and hx of Fatty liver. The patient presented for initial endocrinology clinic visit on 08/20/2020 for consultative assistance with her diabetes management.  ? ? ?Pt on adjuvant therapy for right Breast ca started 06/2020 ( cyclophosphamide, doxorubicin with pegfilgrastim on day 3 )but  she started weekly paclitaxel 08/21/2020.  ? ?DIABETIC HISTORY:  ?Ms. Gladwin was diagnosed with DM in 2015. She has been on metformin only since her diagnosis.  Her hemoglobin A1c has ranged from 6.9% in 2019, peaking at 7.5% in 2020. ? ? ?On her initial visit to our clinic she had an A1c of 7.4%, she was on metformin only, she has noted hyperglycemia during chemotherapy, we started Jardiance at the time. ? ? ?SUBJECTIVE:  ? ?During the last visit (12/24/2020): A1c 7.6%, continued metformin and increased Jardiance ? ?Today (12/24/2020): Ms. Hinde is here for follow-up on diabetes management.  She checks her blood sugars 1 times daily. The patient has not had hypoglycemic episodes since the last clinic visit. ? ? ?She continues to follow up with oncology for breast CA,completed  chemo 11/13/2020.  Completed radiation 03/2021. started anastrozole 03/2021 ? ? ?HOME DIABETES REGIMEN: ?Metformin 500 mg XR 2 tabs BID  ?Jardiance 25 mg daily ? ?Statin: yes ?ACE-I/ARB: no- Allergic to lisinopril  ?Prior Diabetic Education: no ? ? ?METER DOWNLOAD SUMMARY: Forgot meter  ? ? ? ?DIABETIC COMPLICATIONS: ?Microvascular complications:  ? ?Denies: CKD, neuropathy, retinopathy  ?Last eye exam: Completed 09/16/2020 ? ?Macrovascular complications:  ? ?Denies: CAD, PVD, CVA ? ? ?PAST HISTORY: ?Past Medical  History:  ?Past Medical History:  ?Diagnosis Date  ? Asthma   ? with respiratory infection  ? Cancer [facility])   ? breast cancer - right  ? Diabetes mellitus without complication ([place])   ? type II  ? Fatty liver   ? Fibroid uterus   ? Hypertension   ? Patient reports that she has never been diagnosied with HTN, "I take HCTZ for swelling in my feet, if needed."  ? Osteoarthritis 03/10/2015  ? PONV (postoperative nausea and vomiting)   ? 2014  ? Refusal of blood transfusions as patient is Jehovah's Witness   ? NO BLOOD PRODUCTS  ? Sleep apnea   ? ?Past Surgical History:  ?Past Surgical History:  ?Procedure Laterality Date  ? BREAST CYST ASPIRATION    ? BREAST EXCISIONAL BIOPSY    ? BREAST LUMPECTOMY WITH SENTINEL LYMPH NODE BIOPSY Right 05/21/2020  ? Procedure: RIGHT BREAST LUMPECTOMY WITH SENTINEL LYMPH NODE BIOPSY;  Surgeon: Stark Klein, MD;  Location: [facility];  Service: General;  Laterality: Right;  ? CHOLECYSTECTOMY  1/11  ? COLONOSCOPY    ? DILATATION & CURRETTAGE/HYSTEROSCOPY WITH RESECTOCOPE N/A 03/13/2014  ? Procedure: DILATATION & CURETTAGE/HYSTEROSCOPY ;  Surgeon: Lyman Speller, MD;  Location: [facility];  Service: Gynecology;  Laterality: N/A;  ? PORTACATH PLACEMENT Left 05/21/2020  ? Procedure: INSERTION PORT-A-CATH;  Surgeon: Stark Klein, MD;  Location: [facility];  Service: General;  Laterality: Left;  ? RADIOACTIVE SEED GUIDED EXCISIONAL BREAST BIOPSY Left 12/30/2016  ? Procedure: RADIOACTIVE SEED GUIDED EXCISIONAL BREAST BIOPSY;  Surgeon: Stark Klein,  MD;  Location: [facility];  Service: General;  Laterality: Left;  ?  ?Social History:  reports that she has never smoked. She has never used smokeless tobacco. She reports that she does not drink alcohol and does not use drugs. ?Family History:  ?Family History  ?Problem Relation Age of Onset  ? Asthma Mother   ? Diabetes Mother   ? Diabetes Father   ? Heart attack Father   ? Diabetes Brother   ? Diabetes Brother   ? Asthma Brother   ? Heart  Problems Sister   ? Asthma Sister   ? Hypertension Sister   ? Heart disease Maternal Grandfather   ? Osteopenia Sister   ? ? ? ?HOME MEDICATIONS: ?Allergies as of 05/09/2021   ? ?   Reactions  ? Lisinopril Shortness Of Breath, Cough  ? wheezing  ? ?  ? ?  ?Medication List  ?  ? ?  ? Accurate as of May 09, 2021  7:38 AM. If you have any questions, ask your nurse or doctor.  ?  ?  ? ?  ? ?albuterol 108 (90 Base) MCG/ACT inhaler ?Commonly known as: VENTOLIN HFA ?Inhale 1-2 puffs into the lungs every 4 (four) hours as needed for wheezing or shortness of breath. ?  ?anastrozole 1 MG tablet ?Commonly known as: ARIMIDEX ?Take 1 tablet (1 mg total) by mouth daily. ?  ?atorvastatin 10 MG tablet ?Commonly known as: LIPITOR ?Take 10 mg by mouth every other day. ?  ?B-D ULTRA-FINE 33 LANCETS Misc ?Check BG 2 times/day ?  ?empagliflozin 25 MG Tabs tablet ?Commonly known as: Jardiance ?Take 1 tablet (25 mg total) by mouth daily before breakfast. ?  ?fluticasone 50 MCG/ACT nasal spray ?Commonly known as: FLONASE ?Place 1 spray into both nostrils daily as needed for allergies or rhinitis. ?  ?loratadine 10 MG tablet ?Commonly known as: CLARITIN ?TAKE 1 TABLET(10 MG) BY MOUTH DAILY ?  ?magic mouthwash w/lidocaine Soln ?Take 5 mLs by mouth 4 (four) times daily as needed for mouth pain. ?  ?metFORMIN 500 MG 24 hr tablet ?Commonly known as: GLUCOPHAGE-XR ?Take 2 tablets (1,000 mg total) by mouth in the morning and at bedtime. ?  ?milk thistle 175 MG tablet ?Take 175 mg by mouth every other day. ?  ?multivitamin Liqd ?Take 5 mLs by mouth daily. ?  ?OMEGA 3 PO ?Take 1 capsule by mouth every other day. ?  ?omeprazole 40 MG capsule ?Commonly known as: PRILOSEC ?Take 40 mg by mouth daily as needed (acid reflux). ?  ?OneTouch Verio test strip ?Generic drug: glucose blood ?CHECK BLOOD GLUCOSE DAILY ?  ?vitamin C 500 MG tablet ?Commonly known as: ASCORBIC ACID ?Take 500 mg by mouth every other day. ?  ? ?  ? ? ? ?ALLERGIES: ?Allergies   ?Allergen Reactions  ? Lisinopril Shortness Of Breath and Cough  ?  wheezing  ? ? ? ?REVIEW OF SYSTEMS: ?A comprehensive ROS was conducted with the patient and is negative except as per HPI and below:  ?Review of Systems  ?Eyes:  Positive for blurred vision.  ?Gastrointestinal:  Negative for nausea and vomiting.  ?Neurological:  Positive for tingling.  ? ?  ?OBJECTIVE:  ? ?VITAL SIGNS: LMP 02/21/2014   ? ?PHYSICAL EXAM:  ?General: Pt appears well and is in NAD  ?Lungs: Clear with good BS bilat with no rales, rhonchi, or wheezes  ?Heart: RRR with normal S1 and S2 and no gallops; no murmurs; no rub  ?Abdomen: Normoactive  bowel sounds, soft, nontender, without masses or organomegaly palpable  ?Extremities:  ?Lower extremities - No pretibial edema. No lesions.  ?Skin: Normal texture and temperature to palpation. No rash noted.   ?Neuro: MS is good with appropriate affect, pt is alert and Ox3  ? ? ?DM foot exam: 05/09/2021 ? ?The skin of the feet is intact without sores or ulcerations. ?The pedal pulses are 2+ on right and 2+ on left. ?The sensation is intact to a screening 5.07, 10 gram monofilament bilaterally ? ? ?DATA REVIEWED: ? ?Lab Results  ?Component Value Date  ? HGBA1C 7.6 (A) 12/24/2020  ? HGBA1C 7.4 (A) 08/20/2020  ? HGBA1C 7.2 (H) 04/09/2020  ? ?Lab Results  ?Component Value Date  ? MICROALBUR <0.7 10/17/2020  ? [place] 59 04/09/2020  ? CREATININE 0.71 04/14/2021  ? ?Lab Results  ?Component Value Date  ? MICRALBCREAT 2.8 10/17/2020  ? ? ?Lab Results  ?Component Value Date  ? CHOL 185 10/17/2020  ? HDL 53.80 10/17/2020  ? [place] 59 04/09/2020  ? LDLDIRECT 101.0 10/17/2020  ? TRIG 222.0 (H) 10/17/2020  ? CHOLHDL 3 10/17/2020  ?     ? Latest Reference Range & Units 04/14/21 08:16  ?Sodium 135 - 145 mmol/L 140  ?Potassium 3.5 - 5.1 mmol/L 4.3  ?Chloride 98 - 111 mmol/L 106  ?CO2 22 - 32 mmol/L 28  ?Glucose 70 - 99 mg/dL 116 (H)  ?BUN 8 - 23 mg/dL 17  ?Creatinine 0.44 - 1.00 mg/dL 0.71  ?Calcium 8.9 - 10.3 mg/dL  10.3  ?Anion gap 5 - 15  6  ?Alkaline Phosphatase 38 - 126 U/L 158 (H)  ?Albumin 3.5 - 5.0 g/dL 4.0  ?AST 15 - 41 U/L 21  ?ALT 0 - 44 U/L 24  ?Total Protein 6.5 - 8.1 g/dL 6.9  ?Total Bilirubin 0.3 - 1.2 mg/dL 0.2 (L

## 2021-05-09 NOTE — Progress Notes (Deleted)
? ?Subjective:  ? ? Patient ID: Yolanda [name], female    DOB: Jan 17, 1961, 62 y.o.   MRN: [id number] ? ?No chief complaint on file. ? ? ?HPI ?Patient is in today for her annual physical exam. ? ?Past Medical History:  ?Diagnosis Date  ? Asthma   ? with respiratory infection  ? Cancer [facility])   ? breast cancer - right  ? Diabetes mellitus without complication ([place])   ? type II  ? Fatty liver   ? Fibroid uterus   ? Hypertension   ? Patient reports that she has never been diagnosied with HTN, "I take HCTZ for swelling in my feet, if needed."  ? Osteoarthritis 03/10/2015  ? PONV (postoperative nausea and vomiting)   ? 2014  ? Refusal of blood transfusions as patient is Jehovah's Witness   ? NO BLOOD PRODUCTS  ? Sleep apnea   ? ? ?Past Surgical History:  ?Procedure Laterality Date  ? BREAST CYST ASPIRATION    ? BREAST EXCISIONAL BIOPSY    ? BREAST LUMPECTOMY WITH SENTINEL LYMPH NODE BIOPSY Right 05/21/2020  ? Procedure: RIGHT BREAST LUMPECTOMY WITH SENTINEL LYMPH NODE BIOPSY;  Surgeon: Stark Klein, MD;  Location: [place];  Service: General;  Laterality: Right;  ? CHOLECYSTECTOMY  1/11  ? COLONOSCOPY    ? DILATATION & CURRETTAGE/HYSTEROSCOPY WITH RESECTOCOPE N/A 03/13/2014  ? Procedure: DILATATION & CURETTAGE/HYSTEROSCOPY ;  Surgeon: Lyman Speller, MD;  Location: [facility];  Service: Gynecology;  Laterality: N/A;  ? PORTACATH PLACEMENT Left 05/21/2020  ? Procedure: INSERTION PORT-A-CATH;  Surgeon: Stark Klein, MD;  Location: [place];  Service: General;  Laterality: Left;  ? RADIOACTIVE SEED GUIDED EXCISIONAL BREAST BIOPSY Left 12/30/2016  ? Procedure: RADIOACTIVE SEED GUIDED EXCISIONAL BREAST BIOPSY;  Surgeon: Stark Klein, MD;  Location: [place];  Service: General;  Laterality: Left;  ? ? ?Family History  ?Problem Relation Age of Onset  ? Asthma Mother   ? Diabetes Mother   ? Diabetes Father   ? Heart attack Father   ? Diabetes Brother   ? Diabetes Brother   ? Asthma Brother   ? Heart Problems Sister   ?  Asthma Sister   ? Hypertension Sister   ? Heart disease Maternal Grandfather   ? Osteopenia Sister   ? ? ?Social History  ? ?Socioeconomic History  ? Marital status: Married  ?  Spouse name: Not on file  ? Number of children: Not on file  ? Years of education: Not on file  ? Highest education level: Not on file  ?Occupational History  ? Not on file  ?Tobacco Use  ? Smoking status: Never  ? Smokeless tobacco: Never  ?Vaping Use  ? Vaping Use: Never used  ?Substance and Sexual Activity  ? Alcohol use: No  ? Drug use: No  ? Sexual activity: Not Currently  ?  Partners: Male  ?  Birth control/protection: Post-menopausal  ?Other Topics Concern  ? Not on file  ?Social History Narrative  ? Not on file  ? ?Social Determinants of Health  ? ?Financial Resource Strain: Not on file  ?Food Insecurity: Not on file  ?Transportation Needs: Not on file  ?Physical Activity: Not on file  ?Stress: Not on file  ?Social Connections: Not on file  ?Intimate Partner Violence: Not on file  ? ? ?Outpatient Medications Prior to Visit  ?Medication Sig Dispense Refill  ? albuterol (PROVENTIL HFA;VENTOLIN HFA) 108 (90 Base) MCG/ACT inhaler Inhale 1-2 puffs into the lungs every  4 (four) hours as needed for wheezing or shortness of breath. 18 g 2  ? anastrozole (ARIMIDEX) 1 MG tablet Take 1 tablet (1 mg total) by mouth daily. 90 tablet 4  ? atorvastatin (LIPITOR) 10 MG tablet Take 10 mg by mouth every other day.    ? B-D ULTRA-FINE 33 LANCETS MISC Check BG 2 times/day    ? empagliflozin (JARDIANCE) 25 MG TABS tablet Take 1 tablet (25 mg total) by mouth daily before breakfast. 90 tablet 3  ? fluticasone (FLONASE) 50 MCG/ACT nasal spray Place 1 spray into both nostrils daily as needed for allergies or rhinitis.    ? loratadine (CLARITIN) 10 MG tablet TAKE 1 TABLET(10 MG) BY MOUTH DAILY 90 tablet 0  ? magic mouthwash w/lidocaine SOLN Take 5 mLs by mouth 4 (four) times daily as needed for mouth pain. 240 mL 1  ? metFORMIN (GLUCOPHAGE-XR) 500 MG 24 hr  tablet Take 2 tablets (1,000 mg total) by mouth in the morning and at bedtime. 360 tablet 3  ? milk thistle 175 MG tablet Take 175 mg by mouth every other day.    ? Multiple Vitamin (MULTIVITAMIN) LIQD Take 5 mLs by mouth daily.    ? Omega-3 Fatty Acids (OMEGA 3 PO) Take 1 capsule by mouth every other day.    ? omeprazole (PRILOSEC) 40 MG capsule Take 40 mg by mouth daily as needed (acid reflux).    ? ONETOUCH VERIO test strip CHECK BLOOD GLUCOSE DAILY 100 strip 3  ? vitamin C (ASCORBIC ACID) 500 MG tablet Take 500 mg by mouth every other day.    ? ?Facility-Administered Medications Prior to Visit  ?Medication Dose Route Frequency Provider Last Rate Last Admin  ? sodium chloride flush (NS) 0.9 % injection 10 mL  10 mL Intracatheter PRN Magrinat, Virgie Dad, MD      ? ? ?Allergies  ?Allergen Reactions  ? Lisinopril Shortness Of Breath and Cough  ?  wheezing  ? ? ?ROS ? ?   ?Objective:  ?  ?Physical Exam ? ?LMP 02/21/2014  ?Wt Readings from Last 3 Encounters:  ?05/09/21 179 lb (81.2 kg)  ?04/14/21 179 lb (81.2 kg)  ?01/15/21 178 lb 11.2 oz (81.1 kg)  ? ? ?Diabetic Foot Exam - Simple   ?No data filed ?  ? ?Lab Results  ?Component Value Date  ? WBC 6.2 04/14/2021  ? HGB 12.3 04/14/2021  ? HCT 39.2 04/14/2021  ? PLT 234 04/14/2021  ? GLUCOSE 116 (H) 04/14/2021  ? CHOL 185 10/17/2020  ? TRIG 222.0 (H) 10/17/2020  ? HDL 53.80 10/17/2020  ? LDLDIRECT 101.0 10/17/2020  ? [place] 59 04/09/2020  ? ALT 24 04/14/2021  ? AST 21 04/14/2021  ? NA 140 04/14/2021  ? K 4.3 04/14/2021  ? CL 106 04/14/2021  ? CREATININE 0.71 04/14/2021  ? BUN 17 04/14/2021  ? CO2 28 04/14/2021  ? TSH 1.58 10/17/2020  ? HGBA1C 6.4 (A) 05/09/2021  ? MICROALBUR <0.7 10/17/2020  ? ? ?Lab Results  ?Component Value Date  ? TSH 1.58 10/17/2020  ? ?Lab Results  ?Component Value Date  ? WBC 6.2 04/14/2021  ? HGB 12.3 04/14/2021  ? HCT 39.2 04/14/2021  ? MCV 92.0 04/14/2021  ? PLT 234 04/14/2021  ? ?Lab Results  ?Component Value Date  ? NA 140 04/14/2021  ? K 4.3  04/14/2021  ? CO2 28 04/14/2021  ? GLUCOSE 116 (H) 04/14/2021  ? BUN 17 04/14/2021  ? CREATININE 0.71 04/14/2021  ? BILITOT 0.2 (  L) 04/14/2021  ? ALKPHOS 158 (H) 04/14/2021  ? AST 21 04/14/2021  ? ALT 24 04/14/2021  ? PROT 6.9 04/14/2021  ? ALBUMIN 4.0 04/14/2021  ? CALCIUM 10.3 04/14/2021  ? ANIONGAP 6 04/14/2021  ? GFR 94.66 04/09/2020  ? ?Lab Results  ?Component Value Date  ? CHOL 185 10/17/2020  ? ?Lab Results  ?Component Value Date  ? HDL 53.80 10/17/2020  ? ?Lab Results  ?Component Value Date  ? [place] 59 04/09/2020  ? ?Lab Results  ?Component Value Date  ? TRIG 222.0 (H) 10/17/2020  ? ?Lab Results  ?Component Value Date  ? CHOLHDL 3 10/17/2020  ? ?Lab Results  ?Component Value Date  ? HGBA1C 6.4 (A) 05/09/2021  ? ? ?   ?Assessment & Plan:  ? ?Problem List Items Addressed This Visit   ?None ? ? ?I am having Revonda Humphrey maintain her B-D ULTRA-FINE 33 LANCETS, albuterol, Omega-3 Fatty Acids (OMEGA 3 PO), atorvastatin, milk thistle, vitamin C, fluticasone, omeprazole, magic mouthwash w/lidocaine, multivitamin, OneTouch Verio, anastrozole, loratadine, empagliflozin, and metFORMIN. ? ?No orders of the defined types were placed in this encounter. ? ? ? ?

## 2021-05-12 ENCOUNTER — Encounter: Payer: Self-pay | Admitting: Family Medicine

## 2021-05-12 ENCOUNTER — Ambulatory Visit (INDEPENDENT_AMBULATORY_CARE_PROVIDER_SITE_OTHER): Payer: 59 | Admitting: Family Medicine

## 2021-05-12 VITALS — BP 138/90 | HR 87 | Resp 20 | Ht 63.0 in | Wt 181.2 lb

## 2021-05-12 DIAGNOSIS — M25571 Pain in right ankle and joints of right foot: Secondary | ICD-10-CM | POA: Diagnosis not present

## 2021-05-12 DIAGNOSIS — R748 Abnormal levels of other serum enzymes: Secondary | ICD-10-CM

## 2021-05-12 DIAGNOSIS — Z0000 Encounter for general adult medical examination without abnormal findings: Secondary | ICD-10-CM | POA: Diagnosis not present

## 2021-05-12 DIAGNOSIS — K760 Fatty (change of) liver, not elsewhere classified: Secondary | ICD-10-CM

## 2021-05-12 DIAGNOSIS — E785 Hyperlipidemia, unspecified: Secondary | ICD-10-CM

## 2021-05-12 DIAGNOSIS — R768 Other specified abnormal immunological findings in serum: Secondary | ICD-10-CM

## 2021-05-12 DIAGNOSIS — M79642 Pain in left hand: Secondary | ICD-10-CM

## 2021-05-12 DIAGNOSIS — Z170 Estrogen receptor positive status [ER+]: Secondary | ICD-10-CM

## 2021-05-12 DIAGNOSIS — I10 Essential (primary) hypertension: Secondary | ICD-10-CM

## 2021-05-12 DIAGNOSIS — E669 Obesity, unspecified: Secondary | ICD-10-CM

## 2021-05-12 DIAGNOSIS — M79641 Pain in right hand: Secondary | ICD-10-CM | POA: Diagnosis not present

## 2021-05-12 DIAGNOSIS — E1169 Type 2 diabetes mellitus with other specified complication: Secondary | ICD-10-CM

## 2021-05-12 DIAGNOSIS — C50411 Malignant neoplasm of upper-outer quadrant of right female breast: Secondary | ICD-10-CM

## 2021-05-12 LAB — COMPREHENSIVE METABOLIC PANEL
ALT: 26 U/L (ref 0–35)
AST: 24 U/L (ref 0–37)
Albumin: 4.4 g/dL (ref 3.5–5.2)
Alkaline Phosphatase: 146 U/L — ABNORMAL HIGH (ref 39–117)
BUN: 15 mg/dL (ref 6–23)
CO2: 30 mEq/L (ref 19–32)
Calcium: 10.7 mg/dL — ABNORMAL HIGH (ref 8.4–10.5)
Chloride: 103 mEq/L (ref 96–112)
Creatinine, Ser: 0.66 mg/dL (ref 0.40–1.20)
GFR: 94.28 mL/min (ref >60.00)
Glucose, Bld: 92 mg/dL (ref 70–99)
Potassium: 4.7 mEq/L (ref 3.5–5.1)
Sodium: 140 mEq/L (ref 135–145)
Total Bilirubin: 0.3 mg/dL (ref 0.2–1.2)
Total Protein: 6.9 g/dL (ref 6.0–8.3)

## 2021-05-12 LAB — CBC WITH DIFFERENTIAL/PLATELET
Basophils Absolute: 0 10*3/uL (ref 0.0–0.1)
Basophils Relative: 0.5 % (ref 0.0–3.0)
Eosinophils Absolute: 0.4 10*3/uL (ref 0.0–0.7)
Eosinophils Relative: 5.8 % — ABNORMAL HIGH (ref 0.0–5.0)
HCT: 38.2 % (ref 36.0–46.0)
Hemoglobin: 12.3 g/dL (ref 12.0–15.0)
Lymphocytes Relative: 24.8 % (ref 12.0–46.0)
Lymphs Abs: 1.6 10*3/uL (ref 0.7–4.0)
MCHC: 32.2 g/dL (ref 30.0–36.0)
MCV: 90.9 fl (ref 78.0–100.0)
Monocytes Absolute: 0.7 10*3/uL (ref 0.1–1.0)
Monocytes Relative: 11.1 % (ref 3.0–12.0)
Neutro Abs: 3.6 10*3/uL (ref 1.4–7.7)
Neutrophils Relative %: 57.8 % (ref 43.0–77.0)
Platelets: 256 10*3/uL (ref 150.0–400.0)
RBC: 4.2 Mil/uL (ref 3.87–5.11)
RDW: 16 % — ABNORMAL HIGH (ref 11.5–15.5)
WBC: 6.3 10*3/uL (ref 4.0–10.5)

## 2021-05-12 LAB — LIPID PANEL
Cholesterol: 164 mg/dL (ref 0–200)
HDL: 68.5 mg/dL (ref >39.00)
LDL Cholesterol: 70 mg/dL (ref 0–99)
NonHDL: 95.37
Total CHOL/HDL Ratio: 2
Triglycerides: 129 mg/dL (ref 0.0–149.0)
VLDL: 25.8 mg/dL (ref 0.0–40.0)

## 2021-05-12 LAB — SEDIMENTATION RATE: Sed Rate: 28 mm/hr (ref 0–30)

## 2021-05-12 NOTE — Assessment & Plan Note (Signed)
And right ankle pain, check labs including RF, ANA, sed rate and she will notify us if worsens. Continue to keep joints mobile.  ?

## 2021-05-12 NOTE — Assessment & Plan Note (Signed)
Following with oncology, medical and radiation. Has completed course of treatment as of last month. ?

## 2021-05-12 NOTE — Assessment & Plan Note (Signed)
Patient encouraged to maintain heart healthy diet, regular exercise, adequate sleep. Consider daily probiotics. Take medications as prescribed. Labs ordered and reviewed. Shingrix is the new shingles shot, 2 shots over 2-6 months, confirm coverage with insurance and document, then can return here for shots with nurse appt or at pharmacy. MGM up todate. Follows with OB/GYN, Dr Sabra Heck for [facility], Dexa, paps, Dexa ordered for July 2023. ?

## 2021-05-12 NOTE — Assessment & Plan Note (Addendum)
hgba1c acceptable, minimize simple carbs. Increase exercise as tolerated. Continue current meds. Encouraged DASH or MIND diet, decrease po intake and increase exercise as tolerated. Needs 7-8 hours of sleep nightly. Avoid trans fats, eat small, frequent meals every 4-5 hours with lean proteins, complex carbs and healthy fats. Minimize simple carbs, high fat foods and processed foods. Following with endocrinology ?

## 2021-05-12 NOTE — Patient Instructions (Addendum)
MIND diet  ? ?Preventive Care 58-62 Years Old,  ?Female ?Preventive care refers to lifestyle choices and visits with your health care provider that can promote health and wellness. Preventive care visits are also called wellness exams. ?What can I expect for my preventive care visit? ?Counseling ?Your health care provider may ask you questions about your: ?Medical history, including: ?Past medical problems. ?Family medical history. ?Pregnancy history. ?Current health, including: ?Menstrual cycle. ?Method of birth control. ?Emotional well-being. ?Home life and relationship well-being. ?Sexual activity and sexual health. ?Lifestyle, including: ?Alcohol, nicotine or tobacco, and drug use. ?Access to firearms. ?Diet, exercise, and sleep habits. ?Work and work Statistician. ?Sunscreen use. ?Safety issues such as seatbelt and bike helmet use. ?Physical exam ?Your health care provider will check your: ?Height and weight. These may be used to calculate your BMI (body mass index). BMI is a measurement that tells if you are at a healthy weight. ?Waist circumference. This measures the distance around your waistline. This measurement also tells if you are at a healthy weight and may help predict your risk of certain diseases, such as type 2 diabetes and high blood pressure. ?Heart rate and blood pressure. ?Body temperature. ?Skin for abnormal spots. ?What immunizations do I need? ?Vaccines are usually given at various ages, according to a schedule. Your health care provider will recommend vaccines for you based on your age, medical history, and lifestyle or other factors, such as travel or where you work. ?What tests do I need? ?Screening ?Your health care provider may recommend screening tests for certain conditions. This may include: ?Lipid and cholesterol levels. ?Diabetes screening. This is done by checking your blood sugar (glucose) after you have not eaten for a while (fasting). ?Pelvic exam and Pap test. ?Hepatitis B  test. ?Hepatitis C test. ?HIV (human immunodeficiency virus) test. ?STI (sexually transmitted infection) testing, if you are at risk. ?Lung cancer screening. ?Colorectal cancer screening. ?Mammogram. Talk with your health care provider about when you should start having regular mammograms. This may depend on whether you have a family history of breast cancer. ?BRCA-related cancer screening. This may be done if you have a family history of breast, ovarian, tubal, or peritoneal cancers. ?Bone density scan. This is done to screen for osteoporosis. ?Talk with your health care provider about your test results, treatment options, and if necessary, the need for more tests. ?Follow these instructions at home: ?Eating and drinking ? ?Eat a diet that includes fresh fruits and vegetables, whole grains, lean protein, and low-fat dairy products. ?Take vitamin and mineral supplements as recommended by your health care provider. ?Do not drink alcohol if: ?Your health care provider tells you not to drink. ?You are pregnant, may be pregnant, or are planning to become pregnant. ?If you drink alcohol: ?Limit how much you have to 0-1 drink a day. ?Know how much alcohol is in your drink. In the [place] one drink equals one 12 oz bottle of beer (355 mL), one 5 oz glass of wine (148 mL), or one 1? oz glass of hard liquor (44 mL). ?Lifestyle ?Brush your teeth every morning and night with fluoride toothpaste. Floss one time each day. ?Exercise for at least 30 minutes 5 or more days each week. ?Do not use any products that contain nicotine or tobacco. These products include cigarettes, chewing tobacco, and vaping devices, such as e-cigarettes. If you need help quitting, ask your health care provider. ?Do not use drugs. ?If you are sexually active, practice safe sex. Use a condom or other  form of protection to prevent STIs. ?If you do not wish to become pregnant, use a form of birth control. If you plan to become pregnant, see your health care  provider for a prepregnancy visit. ?Take aspirin only as told by your health care provider. Make sure that you understand how much to take and what form to take. Work with your health care provider to find out whether it is safe and beneficial for you to take aspirin daily. ?Find healthy ways to manage stress, such as: ?Meditation, yoga, or listening to music. ?Journaling. ?Talking to a trusted person. ?Spending time with friends and family. ?Minimize exposure to UV radiation to reduce your risk of skin cancer. ?Safety ?Always wear your seat belt while driving or riding in a vehicle. ?Do not drive: ?If you have been drinking alcohol. Do not ride with someone who has been drinking. ?When you are tired or distracted. ?While texting. ?If you have been using any mind-altering substances or drugs. ?Wear a helmet and other protective equipment during sports activities. ?If you have firearms in your house, make sure you follow all gun safety procedures. ?Seek help if you have been physically or sexually abused. ?What's next? ?Visit your health care provider once a year for an annual wellness visit. ?Ask your health care provider how often you should have your eyes and teeth checked. ?Stay up to date on all vaccines. ?This information is not intended to replace advice given to you by your health care provider. Make sure you discuss any questions you have with your health care provider. ?Document Revised: 07/31/2020 Document Reviewed: 07/31/2020 ?Elsevier Patient Education ? [place]. ? ?

## 2021-05-12 NOTE — Assessment & Plan Note (Signed)
Well controlled, no changes to meds. Encouraged heart healthy diet such as the DASH diet and exercise as tolerated.  °

## 2021-05-12 NOTE — Assessment & Plan Note (Signed)
Likely contributed to by her chemo and radiation will check isoenzymes and monitor ?

## 2021-05-12 NOTE — Assessment & Plan Note (Signed)
Encourage heart healthy diet such as MIND or DASH diet, increase exercise, avoid trans fats, simple carbohydrates and processed foods.  ?

## 2021-05-12 NOTE — Progress Notes (Signed)
? ?Subjective:  ? ?By signing my name below, I, Zite Okoli, attest that this documentation has been prepared under the direction and in the presence of Mosie Lukes, MD. 05/12/2021  ?   ? ? Patient ID: Yolanda [name], female    DOB: 04-13-1960, 61 y.o.   MRN: [id number] ? ?Chief Complaint  ?Patient presents with  ? Annual Exam  ? ? ?HPI ?Patient is in today for a comprehensive physical exam and follow up on chronic medical concerns. ? ?She finished chemo in November 2022 and radiation in February 2023. ? ?She takes 10 mg Lipitor 3 times a week to manage her cholesterol.  ? ?She reports her fingers have become more gnarly and tender. Worse in the right hand. The fingers are not hot to touch. She reports her right ankle has been hurting and the pain is often intermittent and lasts for a short period of time. Adds that when the pain starts, she finds it hard to walk and feels like she is going to fall. Happens about twice a month. No family history of arthritis.  ? ?She reports having a swelling in her abdomen that is more prominent when she sits up. It is not too worrisome at this time.  ? ?She reports that her alkaline phosphatase levels have been high. Has a history of fatty liver. ? ?Her sugar levels have been stabilizing and she is following up with Dr Kelton Pillar. ? ?She denies fever, congestion, eye pain, chest pain, palpitations, leg swelling, shortness of breath, nausea, abdominal pain, diarrhea and blood in stool. Also denies dysuria, frequency, back pain and headaches.  ? ?UTD on dental and vision care  ? ?She will receive the 1st dose of the shingles vaccine today.  ? ?No changes in family medical history. ? ?Past Medical History:  ?Diagnosis Date  ? Anemia May 2022  ? Chemotherapy  ? Asthma   ? with respiratory infection  ? Cancer [facility])   ? breast cancer - right  ? Cataract March 2018  ? Optometrist visit  ? Diabetes mellitus without complication ([place])   ? type II  ? Fatty liver   ? Fibroid uterus   ?  GERD (gastroesophageal reflux disease) June 2021  ? Hypertension   ? Patient reports that she has never been diagnosied with HTN, "I take HCTZ for swelling in my feet, if needed."  ? Osteoarthritis 03/10/2015  ? PONV (postoperative nausea and vomiting)   ? 2014  ? Refusal of blood transfusions as patient is Jehovah's Witness   ? NO BLOOD PRODUCTS  ? Sleep apnea   ? ? ?Past Surgical History:  ?Procedure Laterality Date  ? BREAST CYST ASPIRATION    ? BREAST EXCISIONAL BIOPSY    ? BREAST LUMPECTOMY WITH SENTINEL LYMPH NODE BIOPSY Right 05/21/2020  ? Procedure: RIGHT BREAST LUMPECTOMY WITH SENTINEL LYMPH NODE BIOPSY;  Surgeon: Stark Klein, MD;  Location: [place];  Service: General;  Laterality: Right;  ? CHOLECYSTECTOMY  02/2009  ? COLONOSCOPY    ? DILATATION & CURRETTAGE/HYSTEROSCOPY WITH RESECTOCOPE N/A 03/13/2014  ? Procedure: DILATATION & CURETTAGE/HYSTEROSCOPY ;  Surgeon: Lyman Speller, MD;  Location: [place] [facility];  Service: Gynecology;  Laterality: N/A;  ? PORTACATH PLACEMENT Left 05/21/2020  ? Procedure: INSERTION PORT-A-CATH;  Surgeon: Stark Klein, MD;  Location: [place];  Service: General;  Laterality: Left;  ? RADIOACTIVE SEED GUIDED EXCISIONAL BREAST BIOPSY Left 12/30/2016  ? Procedure: RADIOACTIVE SEED GUIDED EXCISIONAL BREAST BIOPSY;  Surgeon: Stark Klein, MD;  Location:  [place];  Service: General;  Laterality: Left;  ? ? ?Family History  ?Problem Relation Age of Onset  ? Asthma Mother   ? Diabetes Mother   ? Diabetes Father   ? Heart attack Father   ? Heart disease Father   ? Diabetes Brother   ? Diabetes Brother   ? Asthma Brother   ? Heart Problems Sister   ? Asthma Sister   ? Hypertension Sister   ? Heart disease Maternal Grandfather   ? Osteopenia Sister   ? ? ?Social History  ? ?Socioeconomic History  ? Marital status: Married  ?  Spouse name: Not on file  ? Number of children: Not on file  ? Years of education: Not on file  ? Highest education level: Not on file  ?Occupational  History  ? Not on file  ?Tobacco Use  ? Smoking status: Never  ? Smokeless tobacco: Never  ?Vaping Use  ? Vaping Use: Never used  ?Substance and Sexual Activity  ? Alcohol use: No  ? Drug use: No  ? Sexual activity: Not Currently  ?  Partners: Male  ?  Birth control/protection: Post-menopausal  ?Other Topics Concern  ? Not on file  ?Social History Narrative  ? Not on file  ? ?Social Determinants of Health  ? ?Financial Resource Strain: Not on file  ?Food Insecurity: Not on file  ?Transportation Needs: Not on file  ?Physical Activity: Not on file  ?Stress: Not on file  ?Social Connections: Not on file  ?Intimate Partner Violence: Not on file  ? ? ?Outpatient Medications Prior to Visit  ?Medication Sig Dispense Refill  ? albuterol (PROVENTIL HFA;VENTOLIN HFA) 108 (90 Base) MCG/ACT inhaler Inhale 1-2 puffs into the lungs every 4 (four) hours as needed for wheezing or shortness of breath. 18 g 2  ? anastrozole (ARIMIDEX) 1 MG tablet Take 1 tablet (1 mg total) by mouth daily. 90 tablet 4  ? atorvastatin (LIPITOR) 10 MG tablet Take 10 mg by mouth every other day.    ? B-D ULTRA-FINE 33 LANCETS MISC Check BG 2 times/day    ? empagliflozin (JARDIANCE) 25 MG TABS tablet Take 1 tablet (25 mg total) by mouth daily before breakfast. 90 tablet 3  ? fluticasone (FLONASE) 50 MCG/ACT nasal spray Place 1 spray into both nostrils daily as needed for allergies or rhinitis.    ? loratadine (CLARITIN) 10 MG tablet TAKE 1 TABLET(10 MG) BY MOUTH DAILY 90 tablet 0  ? metFORMIN (GLUCOPHAGE-XR) 500 MG 24 hr tablet Take 2 tablets (1,000 mg total) by mouth in the morning and at bedtime. 360 tablet 3  ? Multiple Vitamin (MULTIVITAMIN) LIQD Take 5 mLs by mouth daily.    ? omeprazole (PRILOSEC) 40 MG capsule Take 40 mg by mouth daily as needed (acid reflux).    ? ONETOUCH VERIO test strip CHECK BLOOD GLUCOSE DAILY 100 strip 3  ? vitamin C (ASCORBIC ACID) 500 MG tablet Take 500 mg by mouth every other day.    ? magic mouthwash w/lidocaine SOLN  Take 5 mLs by mouth 4 (four) times daily as needed for mouth pain. 240 mL 1  ? milk thistle 175 MG tablet Take 175 mg by mouth every other day.    ? Omega-3 Fatty Acids (OMEGA 3 PO) Take 1 capsule by mouth every other day.    ? ?Facility-Administered Medications Prior to Visit  ?Medication Dose Route Frequency Provider Last Rate Last Admin  ? sodium chloride flush (NS) 0.9 % injection  10 mL  10 mL Intracatheter PRN Magrinat, Virgie Dad, MD      ? ? ?Allergies  ?Allergen Reactions  ? Lisinopril Shortness Of Breath and Cough  ?  wheezing  ? ? ?Review of Systems  ?Constitutional:  Negative for fever.  ?HENT:  Negative for congestion.   ?Eyes:  Negative for pain.  ?Respiratory:  Negative for shortness of breath.   ?Cardiovascular:  Negative for chest pain, palpitations and leg swelling.  ?Gastrointestinal:  Negative for abdominal pain, blood in stool, diarrhea and nausea.  ?Genitourinary:  Negative for dysuria and frequency.  ?Musculoskeletal:  Positive for joint pain (right ankle). Negative for back pain.  ?     (+) hand pain  ?Neurological:  Negative for headaches.  ?Endo/Heme/Allergies:  Positive for environmental allergies.  ? ?   ?Objective:  ?  ?Physical Exam ?Constitutional:   ?   General: She is not in acute distress. ?   Appearance: She is well-developed.  ?HENT:  ?   Head: Normocephalic and atraumatic.  ?   Right Ear: Tympanic membrane, ear canal and external ear normal.  ?   Left Ear: Tympanic membrane, ear canal and external ear normal.  ?Eyes:  ?   Extraocular Movements:  ?   Right eye: No nystagmus.  ?   Left eye: No nystagmus.  ?   Conjunctiva/sclera: Conjunctivae normal.  ?Neck:  ?   Thyroid: No thyromegaly.  ?Cardiovascular:  ?   Rate and Rhythm: Normal rate and regular rhythm.  ?   Heart sounds: Normal heart sounds. No murmur heard. ?Pulmonary:  ?   Effort: Pulmonary effort is normal. No respiratory distress.  ?   Breath sounds: Normal breath sounds.  ?Abdominal:  ?   General: Bowel sounds are normal.  There is no distension.  ?   Palpations: Abdomen is soft. There is no mass.  ?   Tenderness: There is no abdominal tenderness.  ?Musculoskeletal:  ?   Cervical back: Neck supple.  ?   Comments: 5/5 strength in

## 2021-05-12 NOTE — Assessment & Plan Note (Addendum)
Encourage heart healthy diet such as MIND or DASH diet, increase exercise, avoid trans fats, simple carbohydrates and processed foods, consider a krill or fish or flaxseed oil cap daily. Taking Atorvastatin roughly 3 x a week, tolerates ?

## 2021-05-12 NOTE — Assessment & Plan Note (Signed)
Quick and lasts less than a minute under lateral malleolus and she will notify us if worsens.  ?

## 2021-05-13 LAB — TSH: TSH: 2.38 u[IU]/mL (ref 0.35–5.50)

## 2021-05-14 ENCOUNTER — Other Ambulatory Visit: Payer: Self-pay

## 2021-05-15 ENCOUNTER — Encounter: Payer: Self-pay | Admitting: *Deleted

## 2021-05-15 LAB — ANTI-NUCLEAR AB-TITER (ANA TITER): ANA Titer 1: 1:40 {titer} — ABNORMAL HIGH

## 2021-05-15 LAB — ANA: Anti Nuclear Antibody (ANA): POSITIVE — AB

## 2021-05-15 LAB — RHEUMATOID FACTOR: Rheumatoid fact SerPl-aCnc: <14 IU/mL (ref <14)

## 2021-05-19 NOTE — Addendum Note (Signed)
Addended by: Lynnea Ferrier R on: 05/19/2021 04:51 PM ? ? Modules accepted: Orders ? ?

## 2021-06-01 ENCOUNTER — Other Ambulatory Visit: Payer: Self-pay | Admitting: Adult Health

## 2021-07-10 ENCOUNTER — Other Ambulatory Visit: Payer: Self-pay | Admitting: *Deleted

## 2021-07-11 ENCOUNTER — Other Ambulatory Visit: Payer: Self-pay | Admitting: *Deleted

## 2021-07-16 NOTE — Progress Notes (Unsigned)
Yolanda [name]  [phone]) [phone] [phone]     ID: Yolanda [name] DOB: 08/11/1960  MR#: [id number]  TDS#:[id number]  Patient Care Team: Mosie Lukes, MD as PCP - General (Family Medicine) Michel Bickers, MD as Consulting Physician (Infectious Diseases) Megan Salon, MD as Consulting Physician (Gynecology) Magrinat, Virgie Dad, MD (Inactive) as Consulting Physician (Oncology) Juanita Craver, MD as Consulting Physician (Gastroenterology) Stark Klein, MD as Consulting Physician (General Surgery) Mauro Kaufmann, RN as Oncology Nurse Navigator Rockwell Germany, RN as Oncology Nurse Navigator Amedeo Kinsman, OD (Optometry) Benay Pike, MD OTHER MD:   CHIEF COMPLAINT: Estrogen receptor positive breast cancer  CURRENT TREATMENT: [adjuvant radiation]; anastrozole   INTERVAL HISTORY: Yolanda [name] returns today for follow up and treatment of her estrogen receptor positive breast cancer.   REVIEW OF SYSTEMS: A detailed review of systems was otherwise stable.   COVID 19 VACCINATION STATUS: fully vaccinated [employer]), with booster 12/2019   HISTORY OF CURRENT ILLNESS: From the original intake note:   I saw Yolanda [name] in December 2018 for evaluation of atypical lobular hyperplasia.  We discussed various strategies regarding breast cancer prevention at that time for her to consider.    More recently (around the time of the Super Bowl 2022) she found a lump in her right breast.  She underwent bilateral diagnostic mammography with tomography and right breast ultrasonography at [facility] on 04/10/2020 showing: breast density category C; palpable 3 cm mass in right breast at 12 o'clock; no enlarged adenopathy in right axilla.  Accordingly on the same day, she proceeded to biopsy of the right breast area in question. The pathology from this procedure ([id number]) showed: invasive ductal carcinoma, grade 3; ductal carcinoma in situ. Prognostic indicators  significant for: estrogen receptor, 90% positive with moderate staining intensity and progesterone receptor, 0% negative. Proliferation marker Ki67 at 25%. HER2 equivocal by immunohistochemistry (2+), but negative by fluorescent in situ hybridization with a signals ratio 1.49 and number per cell 2.75.   Cancer Staging  Malignant neoplasm of upper-outer quadrant of right breast in female, estrogen receptor positive ([place]) Staging form: Breast, AJCC 8th Edition - Clinical: Stage IIA (cT2, cN0, cM0, G3, ER+, PR+, HER2-) - Signed by Chauncey Cruel, MD on 04/18/2020   The patient's subsequent history is as detailed below.   PAST MEDICAL HISTORY: Past Medical History:  Diagnosis Date   Anemia May 2022   Chemotherapy   Asthma    with respiratory infection   Cancer [facility])    breast cancer - right   Cataract March 2018   Optometrist visit   Diabetes mellitus without complication ([place])    type II   Fatty liver    Fibroid uterus    GERD (gastroesophageal reflux disease) June 2021   Hypertension    Patient reports that she has never been diagnosied with HTN, "I take HCTZ for swelling in my feet, if needed."   Osteoarthritis 03/10/2015   PONV (postoperative nausea and vomiting)    2014   Refusal of blood transfusions as patient is Jehovah's Witness    NO BLOOD PRODUCTS   Sleep apnea     PAST SURGICAL HISTORY: Past Surgical History:  Procedure Laterality Date   BREAST CYST ASPIRATION     BREAST EXCISIONAL BIOPSY     BREAST LUMPECTOMY WITH SENTINEL LYMPH NODE BIOPSY Right 05/21/2020   Procedure: RIGHT BREAST LUMPECTOMY WITH SENTINEL LYMPH NODE BIOPSY;  Surgeon: Stark Klein, MD;  Location: [place];  Service: General;  Laterality: Right;   CHOLECYSTECTOMY  02/2009   COLONOSCOPY     DILATATION & CURRETTAGE/HYSTEROSCOPY WITH RESECTOCOPE N/A 03/13/2014   Procedure: DILATATION & CURETTAGE/HYSTEROSCOPY ;  Surgeon: Lyman Speller, MD;  Location: [facility];  Service: Gynecology;  Laterality:  N/A;   PORTACATH PLACEMENT Left 05/21/2020   Procedure: INSERTION PORT-A-CATH;  Surgeon: Stark Klein, MD;  Location: [place];  Service: General;  Laterality: Left;   RADIOACTIVE SEED GUIDED EXCISIONAL BREAST BIOPSY Left 12/30/2016   Procedure: RADIOACTIVE SEED GUIDED EXCISIONAL BREAST BIOPSY;  Surgeon: Stark Klein, MD;  Location: [place];  Service: General;  Laterality: Left;    FAMILY HISTORY: Family History  Problem Relation Age of Onset   Asthma Mother    Diabetes Mother    Diabetes Father    Heart attack Father    Heart disease Father    Diabetes Brother    Diabetes Brother    Asthma Brother    Heart Problems Sister    Asthma Sister    Hypertension Sister    Heart disease Maternal Grandfather    Osteopenia Sister   The patient's father died from heart disease at the age of 62.  The patient's mother is 40 years old as of March 2022.  The patient has 4 brothers and 2 sisters.  She is not aware of any history of cancer in her family.   GYNECOLOGIC HISTORY:  Patient's last menstrual period was 02/21/2014. Menarche: 61 years old GX P 0 LMP age 77 Contraceptive 1 year, no complications HRT no  Hysterectomy? no BSO? 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  Her husband [name] works as a Marketing executive for [employer].  He has 2 children from a prior marriage, both living in [place].  One is traveling nurse.  The other 1 is a Administrator.  The patient is a Restaurant manager, fast food.    ADVANCED DIRECTIVES: In the absence of any documentation to the contrary, the patient's spouse is their HCPOA.  The patient made it clear at her visit 04/18/2020 that she would not accept any blood transfusion or blood products for any reason including to stave off death   HEALTH MAINTENANCE: Social History   Tobacco Use   Smoking status: Never   Smokeless tobacco: Never  Vaping Use   Vaping Use: Never used  Substance Use Topics   Alcohol  use: No   Drug use: No     Colonoscopy: 10/2019 (Dr. Collene Mares), recall 2026  PAP: 03/2017, negative  Bone density: 09/2018, -0.8   Allergies  Allergen Reactions   Lisinopril Shortness Of Breath and Cough    wheezing    Current Outpatient Medications  Medication Sig Dispense Refill   albuterol (PROVENTIL HFA;VENTOLIN HFA) 108 (90 Base) MCG/ACT inhaler Inhale 1-2 puffs into the lungs every 4 (four) hours as needed for wheezing or shortness of breath. 18 g 2   anastrozole (ARIMIDEX) 1 MG tablet Take 1 tablet (1 mg total) by mouth daily. 90 tablet 4   atorvastatin (LIPITOR) 10 MG tablet Take 10 mg by mouth every other day.     B-D ULTRA-FINE 33 LANCETS MISC Check BG 2 times/day     empagliflozin (JARDIANCE) 25 MG TABS tablet Take 1 tablet (25 mg total) by mouth daily before breakfast. 90 tablet 3   fluticasone (FLONASE) 50 MCG/ACT nasal spray Place 1 spray into both nostrils daily as needed for allergies or rhinitis.     loratadine (CLARITIN) 10  MG tablet TAKE 1 TABLET(10 MG) BY MOUTH DAILY 90 tablet 3   metFORMIN (GLUCOPHAGE-XR) 500 MG 24 hr tablet Take 2 tablets (1,000 mg total) by mouth in the morning and at bedtime. 360 tablet 3   Multiple Vitamin (MULTIVITAMIN) LIQD Take 5 mLs by mouth daily.     omeprazole (PRILOSEC) 40 MG capsule Take 40 mg by mouth daily as needed (acid reflux).     ONETOUCH VERIO test strip CHECK BLOOD GLUCOSE DAILY 100 strip 3   vitamin C (ASCORBIC ACID) 500 MG tablet Take 500 mg by mouth every other day.     No current facility-administered medications for this visit.   Facility-Administered Medications Ordered in Other Visits  Medication Dose Route Frequency Provider Last Rate Last Admin   sodium chloride flush (NS) 0.9 % injection 10 mL  10 mL Intracatheter PRN Magrinat, Virgie Dad, MD       SUPPLEMENTS: Aside from the medications listed above, the patient takes a teaspoon of olive oil daily, 1000 mg of vitamin C every other day, sunflower lecithin 600 mg every  other day, vitamin D3 5000 mg every other day, magnesium 200 mg every other day, milk thistle 150 mg every other day, a B complex vitamin (B6 and B12) every other day, as well as Osteo Bi-Flex weekly and fluticasone as needed   OBJECTIVE:  There were no vitals filed for this visit.     There is no height or weight on file to calculate BMI.   Wt Readings from Last 3 Encounters:  05/12/21 181 lb 3.2 oz (82.2 kg)  05/09/21 179 lb (81.2 kg)  04/14/21 179 lb (81.2 kg)  ECOG FS:1 - Symptomatic but completely ambulatory  Physical examination: Right breast wound appears to be healing, small area of erythema around the wound.  However there is no evidence of infection.   LAB RESULTS:  CMP     Component Value Date/Time   NA 140 05/12/2021 1141   NA 138 11/04/2017 0000   K 4.7 05/12/2021 1141   CL 103 05/12/2021 1141   CO2 30 05/12/2021 1141   GLUCOSE 92 05/12/2021 1141   BUN 15 05/12/2021 1141   BUN 14 11/04/2017 0000   CREATININE 0.66 05/12/2021 1141   CREATININE 0.63 01/15/2021 0943   CREATININE 0.60 09/07/2014 0950   CALCIUM 10.7 (H) 05/12/2021 1141   CALCIUM 9.7 10/02/2020 1030   PROT 6.9 05/12/2021 1141   ALBUMIN 4.4 05/12/2021 1141   AST 24 05/12/2021 1141   AST 26 01/15/2021 0943   ALT 26 05/12/2021 1141   ALT 27 01/15/2021 0943   ALKPHOS 146 (H) 05/12/2021 1141   BILITOT 0.3 05/12/2021 1141   BILITOT 0.5 01/15/2021 0943   GFRNONAA >60 04/14/2021 0816   GFRNONAA >60 01/15/2021 0943   GFRAA >60 05/15/2019 1803    No results found for: Ronnald Ramp, A1GS, A2GS, BETS, BETA2SER, GAMS, MSPIKE, SPEI  Lab Results  Component Value Date   WBC 6.3 05/12/2021   NEUTROABS 3.6 05/12/2021   HGB 12.3 05/12/2021   HCT 38.2 05/12/2021   MCV 90.9 05/12/2021   PLT 256.0 05/12/2021    No results found for: LABCA2  No components found for: QRFXJO832  No results for input(s): INR in the last 168 hours.  No results found for: LABCA2  No results found for:  PQD826  No results found for: EBR830  No results found for: NMM768  No results found for: CA2729  No components found for: HGQUANT  No results found  for: CEA1 / No results found for: CEA1   No results found for: AFPTUMOR  No results found for: CHROMOGRNA  No results found for: KPAFRELGTCHN, LAMBDASER, KAPLAMBRATIO (kappa/lambda light chains)  No results found for: HGBA, HGBA2QUANT, HGBFQUANT, HGBSQUAN (Hemoglobinopathy evaluation)   No results found for: LDH  Lab Results  Component Value Date   IRON 38 (L) 10/16/2009   IRONPCTSAT 10.2 (L) 10/16/2009   (Iron and TIBC)  Lab Results  Component Value Date   FERRITIN 8.9 (L) 10/16/2009    Urinalysis    Component Value Date/Time   COLORURINE YELLOW 03/11/2009 1100   APPEARANCEUR CLEAR 03/11/2009 1100   LABSPEC 1.017 03/11/2009 1100   PHURINE 7.0 03/11/2009 1100   GLUCOSEU NEGATIVE 03/11/2009 1100   HGBUR LARGE (A) 03/11/2009 1100   BILIRUBINUR N 05/21/2015 1614   KETONESUR NEGATIVE 03/11/2009 1100   PROTEINUR N 05/21/2015 1614   PROTEINUR NEGATIVE 03/11/2009 1100   UROBILINOGEN negative 05/21/2015 1614   UROBILINOGEN 0.2 03/11/2009 1100   NITRITE N 05/21/2015 1614   NITRITE NEGATIVE 03/11/2009 1100   LEUKOCYTESUR Negative 05/21/2015 1614    STUDIES: No results found.   ELIGIBLE FOR AVAILABLE RESEARCH PROTOCOL: no  ASSESSMENT: 61 y.o.  woman status post right breast upper outer quadrant biopsy 04/10/2020 for a clinical T2N0, stage IIA invasive ductal carcinoma, grade 3, estrogen receptor positive, progesterone receptor and HER-2 negative, with an MIB-1 of 25%.  (1) Oncotype obtained from the original biopsy shows a score of 48, predicting a recurrence rate outside the breast within 9 years of 28% if the only systemic therapy is antiestrogen for 5 years.  It also predicts a greater than 15% benefit from chemotherapy.  (2) right lumpectomy and sentinel lymph node dissection 05/21/2020 shows a pT2  pN0, stage IIB invasive ductal carcinoma, grade 3, with negative margins.  (3) adjuvant chemotherapy to consist of cyclophosphamide and doxorubicin in dose dense fashion x4 starting 06/18/2020, with Neulasta day 3; followed by paclitaxel weekly x12 started 08/28/2020 through 11/13/2020  (a) echo 05/13/2020 shows an ejection fraction in the 55-60% range  (4) adjuvant radiation 03/27/2020    PLAN:  During his last visit, Dr. Jana Hakim has recommended that patient start anastrozole after discussing about available options.  However patient has not started this because she was very worried about the effect of the medication on the bone density.  She apparently read on the Internet that the effect on the bones can start as soon as 24 hours after taking the medication. She is again reluctant to even try tamoxifen because of the side effects from tamoxifen. I have once again gone over the Oncotype score which showed score of 48 predicting a recurrence rate outside the breast within 9 years of almost 28% provided she takes antiestrogen therapy for 5 years. Without antiestrogen therapy that risk is even higher.  I tried to discuss that when this breast cancer returns, it can return outside the breast which can make it for stage IV breast cancer which at that point is not curable. At this time, I believe that her benefits from antiestrogen therapy significantly outweigh her risks. We have discussed once again about tamoxifen, mechanism of action of tamoxifen, adverse effects of tamoxifen including risk of blood clots, endometrial cancer, increased risk of cardiovascular events, effect on bone density etc.  We have once again discussed about anastrozole which was recommended by Dr. Jana Hakim, mechanism of action, effect on bone density, arthralgias, myalgias which have been in 1 out of 5 people who  take this medication, questionably increased risk of cardiovascular events. I have encouraged her to try the  anastrozole, return to clinic with [place] in 3 months to review the toxicity and then we can try other options if she cannot tolerate anastrozole.  She expressed understanding of the recommendations.  She expressed understanding of the importance of antiestrogen therapy.  She will start it today. She was also asked to watch out for any infection in the wound, if she experiences severe pain, fevers, chills or purulent discharge then she needs to inform us immediately because the wound could be infected. She will otherwise return to clinic in 3 months. Mammogram due however given the wound, she might not be able to do it immediately, she will schedule it during summer. Bone density will be done around the same time, last bone density was in 2020. Total encounter time 30 minutes.*   *Total Encounter Time as defined by the Centers for Medicare and Medicaid Services includes, in addition to the face-to-face time of a patient visit (documented in the note above) non-face-to-face time: obtaining and reviewing outside history, ordering and reviewing medications, tests or procedures, care coordination (communications with other health care professionals or caregivers) and documentation in the medical record.

## 2021-07-17 ENCOUNTER — Other Ambulatory Visit: Payer: Self-pay

## 2021-07-17 ENCOUNTER — Inpatient Hospital Stay: Payer: 59 | Attending: Hematology and Oncology | Admitting: Hematology and Oncology

## 2021-07-17 ENCOUNTER — Encounter: Payer: Self-pay | Admitting: Hematology and Oncology

## 2021-07-17 VITALS — BP 152/90 | HR 79 | Temp 97.6°F | Wt 179.8 lb

## 2021-07-17 DIAGNOSIS — Z79811 Long term (current) use of aromatase inhibitors: Secondary | ICD-10-CM | POA: Diagnosis not present

## 2021-07-17 DIAGNOSIS — R5383 Other fatigue: Secondary | ICD-10-CM | POA: Diagnosis not present

## 2021-07-17 DIAGNOSIS — C50411 Malignant neoplasm of upper-outer quadrant of right female breast: Secondary | ICD-10-CM | POA: Insufficient documentation

## 2021-07-17 DIAGNOSIS — Z170 Estrogen receptor positive status [ER+]: Secondary | ICD-10-CM | POA: Diagnosis not present

## 2021-07-17 DIAGNOSIS — R519 Headache, unspecified: Secondary | ICD-10-CM | POA: Diagnosis not present

## 2021-07-17 DIAGNOSIS — M2550 Pain in unspecified joint: Secondary | ICD-10-CM | POA: Diagnosis not present

## 2021-07-18 ENCOUNTER — Telehealth: Payer: Self-pay | Admitting: Hematology and Oncology

## 2021-07-18 NOTE — Telephone Encounter (Signed)
Scheduled appointment per 06/01 los. Patient aware.

## 2021-07-30 ENCOUNTER — Other Ambulatory Visit: Payer: Self-pay

## 2021-07-30 MED ORDER — OMEPRAZOLE 40 MG PO CPDR: 40.0000 mg | DELAYED_RELEASE_CAPSULE | Freq: Every day | ORAL | 2 refills | Status: DC

## 2021-08-05 ENCOUNTER — Encounter: Payer: Self-pay | Admitting: Family Medicine

## 2021-08-05 ENCOUNTER — Ambulatory Visit (INDEPENDENT_AMBULATORY_CARE_PROVIDER_SITE_OTHER): Payer: 59 | Admitting: Family Medicine

## 2021-08-05 VITALS — BP 140/80 | HR 88 | Temp 98.0°F | Ht 63.0 in | Wt 182.0 lb

## 2021-08-05 DIAGNOSIS — R21 Rash and other nonspecific skin eruption: Secondary | ICD-10-CM

## 2021-08-05 MED ORDER — PREDNISONE 20 MG PO TABS: ORAL_TABLET | ORAL | 0 refills | Status: DC

## 2021-08-05 NOTE — Progress Notes (Signed)
Chief Complaint  Patient presents with   Rash    Yolanda [name] is a 61 y.o. female here for a skin complaint.  Duration: 1 week Location: neck, arm Pruritic? Yes Painful? No Drainage? No New soaps/lotions/topicals/detergents? No Sick contacts? No Other associated symptoms: drank milk prior to breaking out, no fevers Therapies tried thus far: used a topical steroid (thinks it may be Kenalog) that helped for an hour  Past Medical History:  Diagnosis Date   Anemia May 2022   Chemotherapy   Asthma    with respiratory infection   Cancer [facility])    breast cancer - right   Cataract March 2018   Optometrist visit   Diabetes mellitus without complication ([place])    type II   Fatty liver    Fibroid uterus    GERD (gastroesophageal reflux disease) June 2021   Hypertension    Patient reports that she has never been diagnosied with HTN, "I take HCTZ for swelling in my feet, if needed."   Osteoarthritis 03/10/2015   PONV (postoperative nausea and vomiting)    2014   Refusal of blood transfusions as patient is Jehovah's Witness    NO BLOOD PRODUCTS   Sleep apnea     BP 140/80   Pulse 88   Temp 98 F (36.7 C) (Oral)   Ht '5\' 3"'$  (1.6 m)   Wt 182 lb (82.6 kg)   LMP 02/21/2014   SpO2 98%   BMI 32.24 kg/m  Gen: awake, alert, appearing stated age Lungs: No accessory muscle use Skin: Over L antecub region, upper chest/back, and neck region there are slightly raised erythematous patches w excoriation (from scratching per patient) without drainage, ttp, fluctuance. There is a slight scale over the posterior neck over the hairline. No hand involvement. It does blanch.  Psych: Age appropriate judgment and insight  Rash - Plan: predniSONE (DELTASONE) 20 MG tablet  She does have a history of rheumatoid arthritis, could be psoriasis.  Could be a more refractory case of atopic dermatitis.  10-day Pred taper, 40 mg daily for 5 days and then 20 mg daily for 5 days.  Avoid scented products.   Go back on Claritin daily.  Try not to scratch.  Could consider biopsy if no improvement versus referral. The patient voiced understanding and agreement to the plan.  [place], DO 08/05/21 8:31 AM

## 2021-08-05 NOTE — Patient Instructions (Signed)
Try not to scratch as this can make things worse. Avoid scented products while dealing with this. You may resume when the itchiness resolves. Cold/cool compresses can help.   Go back on Claritin daily while dealing with this.   Let us know if you need anything.

## 2021-08-29 NOTE — Progress Notes (Unsigned)
Subjective:    Patient ID: Yolanda [name], female    DOB: 05-22-1960, 61 y.o.   MRN: [id number]  No chief complaint on file.   HPI Patient is in today for a follow up.  Past Medical History:  Diagnosis Date   Anemia May 2022   Chemotherapy   Asthma    with respiratory infection   Cancer [facility])    breast cancer - right   Cataract March 2018   Optometrist visit   Diabetes mellitus without complication ([place])    type II   Fatty liver    Fibroid uterus    GERD (gastroesophageal reflux disease) June 2021   Hypertension    Patient reports that she has never been diagnosied with HTN, "I take HCTZ for swelling in my feet, if needed."   Osteoarthritis 03/10/2015   PONV (postoperative nausea and vomiting)    2014   Refusal of blood transfusions as patient is Jehovah's Witness    NO BLOOD PRODUCTS   Sleep apnea     Past Surgical History:  Procedure Laterality Date   BREAST CYST ASPIRATION     BREAST EXCISIONAL BIOPSY     BREAST LUMPECTOMY WITH SENTINEL LYMPH NODE BIOPSY Right 05/21/2020   Procedure: RIGHT BREAST LUMPECTOMY WITH SENTINEL LYMPH NODE BIOPSY;  Surgeon: Stark Klein, MD;  Location: [place];  Service: General;  Laterality: Right;   CHOLECYSTECTOMY  02/2009   COLONOSCOPY     DILATATION & CURRETTAGE/HYSTEROSCOPY WITH RESECTOCOPE N/A 03/13/2014   Procedure: DILATATION & CURETTAGE/HYSTEROSCOPY ;  Surgeon: Lyman Speller, MD;  Location: [facility];  Service: Gynecology;  Laterality: N/A;   PORTACATH PLACEMENT Left 05/21/2020   Procedure: INSERTION PORT-A-CATH;  Surgeon: Stark Klein, MD;  Location: [place];  Service: General;  Laterality: Left;   RADIOACTIVE SEED GUIDED EXCISIONAL BREAST BIOPSY Left 12/30/2016   Procedure: RADIOACTIVE SEED GUIDED EXCISIONAL BREAST BIOPSY;  Surgeon: Stark Klein, MD;  Location: [place];  Service: General;  Laterality: Left;    Family History  Problem Relation Age of Onset   Asthma Mother    Diabetes Mother     Diabetes Father    Heart attack Father    Heart disease Father    Diabetes Brother    Diabetes Brother    Asthma Brother    Heart Problems Sister    Asthma Sister    Hypertension Sister    Heart disease Maternal Grandfather    Osteopenia Sister     Social History   Socioeconomic History   Marital status: Married    Spouse name: Not on file   Number of children: Not on file   Years of education: Not on file   Highest education level: Not on file  Occupational History   Not on file  Tobacco Use   Smoking status: Never   Smokeless tobacco: Never  Vaping Use   Vaping Use: Never used  Substance and Sexual Activity   Alcohol use: No   Drug use: No   Sexual activity: Not Currently    Partners: Male    Birth control/protection: Post-menopausal  Other Topics Concern   Not on file  Social History Narrative   Not on file   Social Determinants of Health   Financial Resource Strain: Low Risk  (04/25/2020)   Overall Financial Resource Strain (CARDIA)    Difficulty of Paying Living Expenses: Not hard at all  Food Insecurity: No Food Insecurity (04/25/2020)   Hunger Vital Sign    Worried About  Running Out of Food in the Last Year: Never true    [place] in the Last Year: Never true  Transportation Needs: No Transportation Needs (04/25/2020)   PRAPARE - Hydrologist (Medical): No    Lack of Transportation (Non-Medical): No  Physical Activity: Not on file  Stress: Not on file  Social Connections: Unknown (04/25/2020)   Social Connection and Isolation Panel [NHANES]    Frequency of Communication with Friends and Family: Not on file    Frequency of Social Gatherings with Friends and Family: Not on file    Attends Religious Services: Not on file    Active Member of Clubs or Organizations: Not on file    Attends Archivist Meetings: Not on file    Marital Status: Married  Intimate Partner Violence: Not on file    Outpatient  Medications Prior to Visit  Medication Sig Dispense Refill   albuterol (PROVENTIL HFA;VENTOLIN HFA) 108 (90 Base) MCG/ACT inhaler Inhale 1-2 puffs into the lungs every 4 (four) hours as needed for wheezing or shortness of breath. 18 g 2   anastrozole (ARIMIDEX) 1 MG tablet Take 1 tablet (1 mg total) by mouth daily. 90 tablet 4   atorvastatin (LIPITOR) 10 MG tablet Take 10 mg by mouth every other day.     B-D ULTRA-FINE 33 LANCETS MISC Check BG 2 times/day     empagliflozin (JARDIANCE) 25 MG TABS tablet Take 1 tablet (25 mg total) by mouth daily before breakfast. 90 tablet 3   fluticasone (FLONASE) 50 MCG/ACT nasal spray Place 1 spray into both nostrils daily as needed for allergies or rhinitis.     loratadine (CLARITIN) 10 MG tablet TAKE 1 TABLET(10 MG) BY MOUTH DAILY 90 tablet 3   metFORMIN (GLUCOPHAGE-XR) 500 MG 24 hr tablet Take 2 tablets (1,000 mg total) by mouth in the morning and at bedtime. 360 tablet 3   Multiple Vitamin (MULTIVITAMIN) LIQD Take 5 mLs by mouth daily.     omeprazole (PRILOSEC) 40 MG capsule Take 1 capsule (40 mg total) by mouth daily. 30 capsule 2   ONETOUCH VERIO test strip CHECK BLOOD GLUCOSE DAILY 100 strip 3   predniSONE (DELTASONE) 20 MG tablet Take 2 tabs daily for 5 days and then 1 tab daily for 5 days. 15 tablet 0   vitamin C (ASCORBIC ACID) 500 MG tablet Take 500 mg by mouth every other day.     Facility-Administered Medications Prior to Visit  Medication Dose Route Frequency Provider Last Rate Last Admin   sodium chloride flush (NS) 0.9 % injection 10 mL  10 mL Intracatheter PRN Magrinat, Virgie Dad, MD        Allergies  Allergen Reactions   Lisinopril Shortness Of Breath and Cough    wheezing    ROS     Objective:    Physical Exam  LMP 02/21/2014  Wt Readings from Last 3 Encounters:  08/05/21 182 lb (82.6 kg)  07/17/21 179 lb 12.8 oz (81.6 kg)  05/12/21 181 lb 3.2 oz (82.2 kg)    Diabetic Foot Exam - Simple   No data filed    Lab Results   Component Value Date   WBC 6.3 05/12/2021   HGB 12.3 05/12/2021   HCT 38.2 05/12/2021   PLT 256.0 05/12/2021   GLUCOSE 92 05/12/2021   CHOL 164 05/12/2021   TRIG 129.0 05/12/2021   HDL 68.50 05/12/2021   LDLDIRECT 101.0 10/17/2020   LDLCALC 70 05/12/2021  ALT 26 05/12/2021   AST 24 05/12/2021   NA 140 05/12/2021   K 4.7 05/12/2021   CL 103 05/12/2021   CREATININE 0.66 05/12/2021   BUN 15 05/12/2021   CO2 30 05/12/2021   TSH 2.38 05/12/2021   HGBA1C 6.4 (A) 05/09/2021   MICROALBUR <0.7 10/17/2020    Lab Results  Component Value Date   TSH 2.38 05/12/2021   Lab Results  Component Value Date   WBC 6.3 05/12/2021   HGB 12.3 05/12/2021   HCT 38.2 05/12/2021   MCV 90.9 05/12/2021   PLT 256.0 05/12/2021   Lab Results  Component Value Date   NA 140 05/12/2021   K 4.7 05/12/2021   CO2 30 05/12/2021   GLUCOSE 92 05/12/2021   BUN 15 05/12/2021   CREATININE 0.66 05/12/2021   BILITOT 0.3 05/12/2021   ALKPHOS 146 (H) 05/12/2021   AST 24 05/12/2021   ALT 26 05/12/2021   PROT 6.9 05/12/2021   ALBUMIN 4.4 05/12/2021   CALCIUM 10.7 (H) 05/12/2021   ANIONGAP 6 04/14/2021   GFR 94.28 05/12/2021   Lab Results  Component Value Date   CHOL 164 05/12/2021   Lab Results  Component Value Date   HDL 68.50 05/12/2021   Lab Results  Component Value Date   LDLCALC 70 05/12/2021   Lab Results  Component Value Date   TRIG 129.0 05/12/2021   Lab Results  Component Value Date   CHOLHDL 2 05/12/2021   Lab Results  Component Value Date   HGBA1C 6.4 (A) 05/09/2021       Assessment & Plan:      Problem List Items Addressed This Visit   None   I am having Revonda Humphrey maintain her B-D ULTRA-FINE 33 LANCETS, albuterol, atorvastatin, vitamin C, fluticasone, multivitamin, OneTouch Verio, anastrozole, empagliflozin, metFORMIN, loratadine, omeprazole, and predniSONE.  No orders of the defined types were placed in this encounter.

## 2021-09-01 ENCOUNTER — Encounter: Payer: Self-pay | Admitting: Family Medicine

## 2021-09-01 ENCOUNTER — Ambulatory Visit (INDEPENDENT_AMBULATORY_CARE_PROVIDER_SITE_OTHER): Payer: 59 | Admitting: Family Medicine

## 2021-09-01 VITALS — BP 134/82 | HR 82 | Resp 20 | Ht 63.0 in | Wt 181.2 lb

## 2021-09-01 DIAGNOSIS — R21 Rash and other nonspecific skin eruption: Secondary | ICD-10-CM

## 2021-09-01 DIAGNOSIS — E119 Type 2 diabetes mellitus without complications: Secondary | ICD-10-CM | POA: Diagnosis not present

## 2021-09-01 DIAGNOSIS — Z23 Encounter for immunization: Secondary | ICD-10-CM

## 2021-09-01 DIAGNOSIS — E1169 Type 2 diabetes mellitus with other specified complication: Secondary | ICD-10-CM

## 2021-09-01 DIAGNOSIS — Z91018 Allergy to other foods: Secondary | ICD-10-CM

## 2021-09-01 DIAGNOSIS — K760 Fatty (change of) liver, not elsewhere classified: Secondary | ICD-10-CM

## 2021-09-01 DIAGNOSIS — I10 Essential (primary) hypertension: Secondary | ICD-10-CM

## 2021-09-01 DIAGNOSIS — E669 Obesity, unspecified: Secondary | ICD-10-CM

## 2021-09-01 DIAGNOSIS — E785 Hyperlipidemia, unspecified: Secondary | ICD-10-CM | POA: Diagnosis not present

## 2021-09-01 DIAGNOSIS — Z170 Estrogen receptor positive status [ER+]: Secondary | ICD-10-CM

## 2021-09-01 DIAGNOSIS — C50411 Malignant neoplasm of upper-outer quadrant of right female breast: Secondary | ICD-10-CM

## 2021-09-01 NOTE — Patient Instructions (Addendum)
Yellow split powder, chia seeds, flaxseeds, hemp seed powder, collagen powder  Allergy Skin Testing Why am I having this test?     Allergy skin testing is done to check whether you have an allergy to something. An allergy occurs when your body's defense system (immune system) is more sensitive to certain substances. The immune system overreacts to the substance, causing allergy symptoms. Allergy skin testing can be used to help find out which substance may be causing your symptoms. Skin testing may be done in one of three ways: Injecting a small amount of the substance that you may be allergic to (allergen) under your skin (intradermal test). Placing a drop of a solution that contains the allergen onto your forearm or back, then puncturing the skin where the droplet is and allowing the fluid to seep in (prick-puncture test or scratch test). Applying the allergen to patches that are placed on your skin (patch test). What is being tested? This test checks to see if an allergic reaction develops on the skin where the allergen was injected or where the patches were applied. Common allergens tested include: Pollens. Dust. Food. Latex. Certain medicines. How do I prepare for this test? If you are having the intradermal or prick-puncture test, no preparation is needed. If you are having a patch test: Do not apply ointments, creams, or lotion to the skin where the patch will be placed. Usually, the patches are placed on your forearm or on your back. Bring any items that you think you are allergic to, such as cosmetics, soaps, and perfume. Some medicines can affect test results. Your health care provider will let you know when to stop taking those medicines and when you can begin taking them again. Tell a health care provider about: Any allergies you have, especially if you have ever had a severe allergic reaction. All medicines you are taking, including vitamins, herbs, eye drops, creams, and  over-the-counter medicines. Any medical conditions you have. What happens during the test? If you will receive an injection or prick-puncture, it will be done in your health care provider's office, and you will likely get your results before leaving. If patches will be applied to your skin, you will need to: Wear them for 48 hours. Return to your health care provider's office to have them removed. Do not remove them yourself. Avoid bathing and activities that cause heavy sweating until after the patches are removed. How are the results reported? Your test results will be reported as measurements of any areas of skin affected. Your health care provider will compare your results to normal ranges that were established after testing a large group of people (reference ranges). Reference ranges may vary among labs and hospitals. For this test, a common normal reference range is: A swollen area of skin (wheal) less than 3 mm in diameter. Surrounding redness and swelling (flare-up) less than 10 mm in diameter. What do the results mean? A result in which the wheal is 3 mm or more and the flare-up is 10 mm or more means that you are likely allergic to the allergen. Talk with your health care provider about what your results mean. Your health care provider will consider the results of your test in addition to your symptoms before diagnosing you with an allergy. Questions to ask your health care provider Ask your health care provider, or the department that is doing the test: When will my results be ready? How will I get my results? What are my treatment options?  What other tests do I need? What are my next steps? Summary Allergy skin testing is done to check whether you have an allergy to something. This test checks to see if an allergic reaction develops on the skin where the allergen was injected or where the patches were applied. Skin testing may be done in one of three ways: an intradermal test, a  prick-puncture or scratch test, or a patch test. A wheal or flare-up on the skin that is larger than a certain size means that you are likely allergic to the allergen. Talk with your health care provider about what your results mean. This information is not intended to replace advice given to you by your health care provider. Make sure you discuss any questions you have with your health care provider. Document Revised: 03/20/2020 Document Reviewed: 03/20/2020 Elsevier Patient Education  [place].

## 2021-09-01 NOTE — Assessment & Plan Note (Signed)
Supplement and monitor 

## 2021-09-01 NOTE — Assessment & Plan Note (Signed)
hgba1c acceptable, minimize simple carbs. Increase exercise as tolerated. Continue current meds 

## 2021-09-01 NOTE — Assessment & Plan Note (Addendum)
hgba1c acceptable, minimize simple carbs. Increase exercise as tolerated. Continue current meds, has followed with Endocrinology but doing well, will check labs today and follow up with endocrinology as needed.  Given Shingrix shot today

## 2021-09-01 NOTE — Assessment & Plan Note (Signed)
Continues to follow closely with oncology, has had trouble with pain, scarring and healing but is slowly improving. Has MGM soon

## 2021-09-01 NOTE — Assessment & Plan Note (Signed)
Encourage heart healthy diet such as MIND or DASH diet, increase exercise, avoid trans fats, simple carbohydrates and processed foods, consider a krill or fish or flaxseed oil cap daily.  °

## 2021-09-01 NOTE — Assessment & Plan Note (Signed)
Well controlled, no changes to meds. Encouraged heart healthy diet such as the DASH diet and exercise as tolerated.  °

## 2021-09-02 LAB — LIPID PANEL
Cholesterol: 162 mg/dL (ref 0–200)
HDL: 61 mg/dL (ref >39.00)
LDL Cholesterol: 65 mg/dL (ref 0–99)
NonHDL: 101.11
Total CHOL/HDL Ratio: 3
Triglycerides: 183 mg/dL — ABNORMAL HIGH (ref 0.0–149.0)
VLDL: 36.6 mg/dL (ref 0.0–40.0)

## 2021-09-02 LAB — HEMOGLOBIN A1C: Hgb A1c MFr Bld: 7.3 % — ABNORMAL HIGH (ref 4.6–6.5)

## 2021-09-02 LAB — CBC
HCT: 36.5 % (ref 36.0–46.0)
Hemoglobin: 11.8 g/dL — ABNORMAL LOW (ref 12.0–15.0)
MCHC: 32.3 g/dL (ref 30.0–36.0)
MCV: 91.3 fl (ref 78.0–100.0)
Platelets: 242 10*3/uL (ref 150.0–400.0)
RBC: 4 Mil/uL (ref 3.87–5.11)
RDW: 15.6 % — ABNORMAL HIGH (ref 11.5–15.5)
WBC: 6.1 10*3/uL (ref 4.0–10.5)

## 2021-09-02 LAB — COMPREHENSIVE METABOLIC PANEL
ALT: 24 U/L (ref 0–35)
AST: 24 U/L (ref 0–37)
Albumin: 4.2 g/dL (ref 3.5–5.2)
Alkaline Phosphatase: 148 U/L — ABNORMAL HIGH (ref 39–117)
BUN: 11 mg/dL (ref 6–23)
CO2: 31 mEq/L (ref 19–32)
Calcium: 10.6 mg/dL — ABNORMAL HIGH (ref 8.4–10.5)
Chloride: 101 mEq/L (ref 96–112)
Creatinine, Ser: 0.81 mg/dL (ref 0.40–1.20)
GFR: 77.85 mL/min (ref >60.00)
Glucose, Bld: 78 mg/dL (ref 70–99)
Potassium: 4.9 mEq/L (ref 3.5–5.1)
Sodium: 140 mEq/L (ref 135–145)
Total Bilirubin: 0.3 mg/dL (ref 0.2–1.2)
Total Protein: 6.5 g/dL (ref 6.0–8.3)

## 2021-09-02 LAB — VITAMIN D 25 HYDROXY (VIT D DEFICIENCY, FRACTURES): VITD: 26.66 ng/mL — ABNORMAL LOW (ref 30.00–100.00)

## 2021-09-02 LAB — TSH: TSH: 1.84 u[IU]/mL (ref 0.35–5.50)

## 2021-09-03 ENCOUNTER — Other Ambulatory Visit: Payer: Self-pay

## 2021-09-03 DIAGNOSIS — E1169 Type 2 diabetes mellitus with other specified complication: Secondary | ICD-10-CM

## 2021-09-03 DIAGNOSIS — R7989 Other specified abnormal findings of blood chemistry: Secondary | ICD-10-CM

## 2021-09-03 DIAGNOSIS — E785 Hyperlipidemia, unspecified: Secondary | ICD-10-CM

## 2021-09-03 DIAGNOSIS — K760 Fatty (change of) liver, not elsewhere classified: Secondary | ICD-10-CM

## 2021-09-03 DIAGNOSIS — I10 Essential (primary) hypertension: Secondary | ICD-10-CM

## 2021-09-11 ENCOUNTER — Ambulatory Visit: Payer: 59 | Admitting: Family Medicine

## 2021-09-15 ENCOUNTER — Ambulatory Visit
Admission: RE | Admit: 2021-09-15 | Discharge: 2021-09-15 | Disposition: A | Discharge status: Home / Self Care | Payer: 59 | Source: Ambulatory Visit | Attending: Hematology and Oncology | Admitting: Hematology and Oncology

## 2021-09-15 DIAGNOSIS — Z13820 Encounter for screening for osteoporosis: Secondary | ICD-10-CM

## 2021-09-15 DIAGNOSIS — Z170 Estrogen receptor positive status [ER+]: Secondary | ICD-10-CM

## 2021-09-16 ENCOUNTER — Telehealth: Payer: Self-pay | Admitting: Hematology and Oncology

## 2021-09-16 NOTE — Telephone Encounter (Signed)
Rescheduled appointment per providers. Patient aware.  ? ?

## 2021-09-18 ENCOUNTER — Encounter: Payer: Self-pay | Admitting: Oncology

## 2021-09-18 ENCOUNTER — Other Ambulatory Visit (INDEPENDENT_AMBULATORY_CARE_PROVIDER_SITE_OTHER): Payer: 59

## 2021-09-18 DIAGNOSIS — R7989 Other specified abnormal findings of blood chemistry: Secondary | ICD-10-CM

## 2021-09-18 DIAGNOSIS — I10 Essential (primary) hypertension: Secondary | ICD-10-CM | POA: Diagnosis not present

## 2021-09-18 DIAGNOSIS — E669 Obesity, unspecified: Secondary | ICD-10-CM

## 2021-09-18 DIAGNOSIS — E785 Hyperlipidemia, unspecified: Secondary | ICD-10-CM | POA: Diagnosis not present

## 2021-09-18 DIAGNOSIS — E1169 Type 2 diabetes mellitus with other specified complication: Secondary | ICD-10-CM | POA: Diagnosis not present

## 2021-09-18 LAB — COMPREHENSIVE METABOLIC PANEL
ALT: 24 U/L (ref 0–35)
AST: 25 U/L (ref 0–37)
Albumin: 4.2 g/dL (ref 3.5–5.2)
Alkaline Phosphatase: 156 U/L — ABNORMAL HIGH (ref 39–117)
BUN: 11 mg/dL (ref 6–23)
CO2: 29 mEq/L (ref 19–32)
Calcium: 10.2 mg/dL (ref 8.4–10.5)
Chloride: 102 mEq/L (ref 96–112)
Creatinine, Ser: 0.62 mg/dL (ref 0.40–1.20)
GFR: 95.47 mL/min (ref >60.00)
Glucose, Bld: 133 mg/dL — ABNORMAL HIGH (ref 70–99)
Potassium: 4.5 mEq/L (ref 3.5–5.1)
Sodium: 139 mEq/L (ref 135–145)
Total Bilirubin: 0.3 mg/dL (ref 0.2–1.2)
Total Protein: 6.7 g/dL (ref 6.0–8.3)

## 2021-09-18 LAB — CBC
HCT: 37.3 % (ref 36.0–46.0)
Hemoglobin: 11.9 g/dL — ABNORMAL LOW (ref 12.0–15.0)
MCHC: 32 g/dL (ref 30.0–36.0)
MCV: 91.2 fl (ref 78.0–100.0)
Platelets: 222 10*3/uL (ref 150.0–400.0)
RBC: 4.09 Mil/uL (ref 3.87–5.11)
RDW: 15.8 % — ABNORMAL HIGH (ref 11.5–15.5)
WBC: 4.7 10*3/uL (ref 4.0–10.5)

## 2021-09-18 LAB — LIPID PANEL
Cholesterol: 156 mg/dL (ref 0–200)
HDL: 59.5 mg/dL (ref >39.00)
LDL Cholesterol: 73 mg/dL (ref 0–99)
NonHDL: 96.48
Total CHOL/HDL Ratio: 3
Triglycerides: 115 mg/dL (ref 0.0–149.0)
VLDL: 23 mg/dL (ref 0.0–40.0)

## 2021-09-18 LAB — VITAMIN D 25 HYDROXY (VIT D DEFICIENCY, FRACTURES): VITD: 17.94 ng/mL — ABNORMAL LOW (ref 30.00–100.00)

## 2021-09-18 LAB — HEMOGLOBIN A1C: Hgb A1c MFr Bld: 7.3 % — ABNORMAL HIGH (ref 4.6–6.5)

## 2021-09-18 LAB — TSH: TSH: 2.48 u[IU]/mL (ref 0.35–5.50)

## 2021-09-19 ENCOUNTER — Telehealth: Payer: Self-pay | Admitting: Internal Medicine

## 2021-09-19 ENCOUNTER — Other Ambulatory Visit: Payer: Self-pay

## 2021-09-19 DIAGNOSIS — E559 Vitamin D deficiency, unspecified: Secondary | ICD-10-CM

## 2021-09-19 LAB — PTH, INTACT AND CALCIUM
Calcium: 10.5 mg/dL — ABNORMAL HIGH (ref 8.6–10.4)
PTH: 90 pg/mL — ABNORMAL HIGH (ref 16–77)

## 2021-09-19 MED ORDER — VITAMIN D (ERGOCALCIFEROL) 1.25 MG (50000 UNIT) PO CAPS: 50000.0000 [IU] | ORAL_CAPSULE | ORAL | 4 refills | Status: DC

## 2021-09-20 LAB — ALKALINE PHOSPHATASE, ISOENZYMES
Alkaline Phosphatase: 177 IU/L — ABNORMAL HIGH (ref 44–121)
BONE FRACTION: 44 % (ref 14–68)
INTESTINAL FRAC.: 22 % — ABNORMAL HIGH (ref 0–18)
LIVER FRACTION: 34 % (ref 18–85)

## 2021-09-23 ENCOUNTER — Other Ambulatory Visit: Payer: Self-pay | Admitting: *Deleted

## 2021-09-23 DIAGNOSIS — R748 Abnormal levels of other serum enzymes: Secondary | ICD-10-CM

## 2021-09-23 DIAGNOSIS — R109 Unspecified abdominal pain: Secondary | ICD-10-CM

## 2021-09-25 NOTE — Telephone Encounter (Signed)
error 

## 2021-09-30 ENCOUNTER — Telehealth: Payer: Self-pay | Admitting: Family Medicine

## 2021-09-30 ENCOUNTER — Telehealth (INDEPENDENT_AMBULATORY_CARE_PROVIDER_SITE_OTHER): Payer: 59 | Admitting: Family Medicine

## 2021-09-30 ENCOUNTER — Encounter: Payer: Self-pay | Admitting: Family Medicine

## 2021-09-30 VITALS — Temp 97.5°F | Ht 63.0 in | Wt 181.2 lb

## 2021-09-30 DIAGNOSIS — U071 COVID-19: Secondary | ICD-10-CM | POA: Diagnosis not present

## 2021-09-30 MED ORDER — NIRMATRELVIR/RITONAVIR (PAXLOVID)TABLET: 3.0000 | ORAL_TABLET | Freq: Two times a day (BID) | ORAL | 0 refills | Status: AC

## 2021-09-30 NOTE — Progress Notes (Signed)
Patient ID: Yolanda [name], female   DOB: 11/26/1960, 61 y.o.   MRN: [id number]   Virtual Visit via Video Note  I connected with Yolanda [name] on 09/30/21 at  2:15 PM EDT by a video enabled telemedicine application and verified that I am speaking with the correct person using two identifiers.  Location patient: home Location provider:work or home office Persons participating in the virtual visit: patient, provider  I discussed the limitations of evaluation and management by telemedicine and the availability of in person appointments. The patient expressed understanding and agreed to proceed.   HPI:  Yolanda [name] has COVID infection.  She had onset of symptoms yesterday.  Tested positive today.  Symptoms include cough, low-grade fever, headache, malaise, nasal congestion.  Denies any nausea or vomiting.  No diarrhea.  Has not had COVID previously.  She does have comorbidities including type 2 diabetes.  No chronic lung issues.   ROS: See pertinent positives and negatives per HPI.  Past Medical History:  Diagnosis Date   Anemia May 2022   Chemotherapy   Asthma    with respiratory infection   Cancer [facility])    breast cancer - right   Cataract March 2018   Optometrist visit   Diabetes mellitus without complication ([place])    type II   Fatty liver    Fibroid uterus    GERD (gastroesophageal reflux disease) June 2021   Hypertension    Patient reports that she has never been diagnosied with HTN, "I take HCTZ for swelling in my feet, if needed."   Osteoarthritis 03/10/2015   PONV (postoperative nausea and vomiting)    2014   Refusal of blood transfusions as patient is Jehovah's Witness    NO BLOOD PRODUCTS   Sleep apnea     Past Surgical History:  Procedure Laterality Date   BREAST CYST ASPIRATION     BREAST EXCISIONAL BIOPSY     BREAST LUMPECTOMY WITH SENTINEL LYMPH NODE BIOPSY Right 05/21/2020   Procedure: RIGHT BREAST LUMPECTOMY WITH SENTINEL LYMPH NODE BIOPSY;  Surgeon:  Stark Klein, MD;  Location: [place];  Service: General;  Laterality: Right;   CHOLECYSTECTOMY  02/2009   COLONOSCOPY     DILATATION & CURRETTAGE/HYSTEROSCOPY WITH RESECTOCOPE N/A 03/13/2014   Procedure: DILATATION & CURETTAGE/HYSTEROSCOPY ;  Surgeon: Lyman Speller, MD;  Location: [place] [facility];  Service: Gynecology;  Laterality: N/A;   PORTACATH PLACEMENT Left 05/21/2020   Procedure: INSERTION PORT-A-CATH;  Surgeon: Stark Klein, MD;  Location: [place];  Service: General;  Laterality: Left;   RADIOACTIVE SEED GUIDED EXCISIONAL BREAST BIOPSY Left 12/30/2016   Procedure: RADIOACTIVE SEED GUIDED EXCISIONAL BREAST BIOPSY;  Surgeon: Stark Klein, MD;  Location: [place];  Service: General;  Laterality: Left;    Family History  Problem Relation Age of Onset   Asthma Mother    Diabetes Mother    Diabetes Father    Heart attack Father    Heart disease Father    Diabetes Brother    Diabetes Brother    Asthma Brother    Heart Problems Sister    Asthma Sister    Hypertension Sister    Heart disease Maternal Grandfather    Osteopenia Sister     SOCIAL HX: Non-smoker   Current Outpatient Medications:    nirmatrelvir/ritonavir EUA (PAXLOVID) 20 x 150 MG & 10 x '100MG'$  TABS, Take 3 tablets by mouth 2 (two) times daily for 5 days. (Take nirmatrelvir 150 mg two tablets twice daily for 5 days  and ritonavir 100 mg one tablet twice daily for 5 days) Patient GFR is >60, Disp: 30 tablet, Rfl: 0   albuterol (PROVENTIL HFA;VENTOLIN HFA) 108 (90 Base) MCG/ACT inhaler, Inhale 1-2 puffs into the lungs every 4 (four) hours as needed for wheezing or shortness of breath., Disp: 18 g, Rfl: 2   anastrozole (ARIMIDEX) 1 MG tablet, Take 1 tablet (1 mg total) by mouth daily., Disp: 90 tablet, Rfl: 4   atorvastatin (LIPITOR) 10 MG tablet, Take 10 mg by mouth every other day., Disp: , Rfl:    B-D ULTRA-FINE 33 LANCETS MISC, Check BG 2 times/day, Disp: , Rfl:    Cholecalciferol 125 MCG (5000 UT)  capsule, Take 5,000 Units by mouth every other day., Disp: , Rfl:    empagliflozin (JARDIANCE) 25 MG TABS tablet, Take 1 tablet (25 mg total) by mouth daily before breakfast., Disp: 90 tablet, Rfl: 3   fluticasone (FLONASE) 50 MCG/ACT nasal spray, Place 1 spray into both nostrils daily as needed for allergies or rhinitis., Disp: , Rfl:    loratadine (CLARITIN) 10 MG tablet, TAKE 1 TABLET(10 MG) BY MOUTH DAILY, Disp: 90 tablet, Rfl: 3   metFORMIN (GLUCOPHAGE-XR) 500 MG 24 hr tablet, Take 2 tablets (1,000 mg total) by mouth in the morning and at bedtime., Disp: 360 tablet, Rfl: 3   Multiple Vitamin (MULTIVITAMIN) LIQD, Take 5 mLs by mouth daily., Disp: , Rfl:    omeprazole (PRILOSEC) 40 MG capsule, Take 1 capsule (40 mg total) by mouth daily., Disp: 30 capsule, Rfl: 2   ONETOUCH VERIO test strip, CHECK BLOOD GLUCOSE DAILY, Disp: 100 strip, Rfl: 3   vitamin C (ASCORBIC ACID) 500 MG tablet, Take 500 mg by mouth every other day., Disp: , Rfl:    Vitamin D, Ergocalciferol, (DRISDOL) 1.25 MG (50000 UNIT) CAPS capsule, Take 1 capsule (50,000 Units total) by mouth every 7 (seven) days., Disp: 4 capsule, Rfl: 4 No current facility-administered medications for this visit.  Facility-Administered Medications Ordered in Other Visits:    sodium chloride flush (NS) 0.9 % injection 10 mL, 10 mL, Intracatheter, PRN, Magrinat, Virgie Dad, MD  EXAM:  VITALS per patient if applicable:  GENERAL: alert, oriented, appears well and in no acute distress  HEENT: atraumatic, conjunttiva clear, no obvious abnormalities on inspection of external nose and ears  NECK: normal movements of the head and neck  LUNGS: on inspection no signs of respiratory distress, breathing rate appears normal, no obvious gross SOB, gasping or wheezing  CV: no obvious cyanosis  MS: moves all visible extremities without noticeable abnormality  PSYCH/NEURO: pleasant and cooperative, no obvious depression or anxiety, speech and thought  processing grossly intact  ASSESSMENT AND PLAN:  Discussed the following assessment and plan:  COVID-19 infection.  Patient nontoxic in appearance.  Given her age and comorbidities including diabetes offered antiviral therapy.  She has history of normal renal function by recent labs just less than 2 weeks ago.  -Start Paxlovid 3 capsules by mouth twice daily -Hold atorvastatin during the next week when taking Paxlovid -Plenty fluids and rest -Review reviewed isolation guidelines -Follow-up for any persistent or worsening symptoms     I discussed the assessment and treatment plan with the patient. The patient was provided an opportunity to ask questions and all were answered. The patient agreed with the plan and demonstrated an understanding of the instructions.   The patient was advised to call back or seek an in-person evaluation if the symptoms worsen or if the condition fails to  improve as anticipated.     Carolann Littler, MD

## 2021-09-30 NOTE — Telephone Encounter (Signed)
Pt started having sxs yesterday, cough, fever and headache. She tested positive today. There were no opening in the office so she would like to know if she can get the antiviral.    [id number] [address], [address] AT [facility] OF [address]   [place], [place] [address]  Phone:  [phone]  Fax:  [phone]

## 2021-09-30 NOTE — Telephone Encounter (Signed)
Pt scheduled with Dr. Elease Hashimoto at 2:15pm today

## 2021-10-02 ENCOUNTER — Encounter: Payer: Self-pay | Admitting: Family Medicine

## 2021-10-02 DIAGNOSIS — E559 Vitamin D deficiency, unspecified: Secondary | ICD-10-CM

## 2021-10-02 MED ORDER — VITAMIN D (ERGOCALCIFEROL) 1.25 MG (50000 UNIT) PO CAPS: 50000.0000 [IU] | ORAL_CAPSULE | ORAL | 4 refills | Status: DC

## 2021-10-03 ENCOUNTER — Other Ambulatory Visit: Payer: Self-pay | Admitting: Family Medicine

## 2021-10-03 DIAGNOSIS — R748 Abnormal levels of other serum enzymes: Secondary | ICD-10-CM

## 2021-10-03 DIAGNOSIS — K635 Polyp of colon: Secondary | ICD-10-CM

## 2021-10-03 NOTE — Telephone Encounter (Signed)
Called pt and Lvm that the was in referral

## 2021-10-03 NOTE — Telephone Encounter (Signed)
Hey Dr.Blyth she said this why she was wanting referral. Notify alk phos is up most notably in gastrointestinal subtype. Recommend consultation with her gastroenterologist to evaluate. We can refer if she is willing. Ask if she is having any current abdominal pain PTH only mildly high, can monitor and repeat all labs in 3 months or can refer to endocrinology for  ... Pt stated she never seen Dr. Collene Mares

## 2021-10-07 ENCOUNTER — Encounter: Payer: Self-pay | Admitting: Family Medicine

## 2021-10-16 ENCOUNTER — Inpatient Hospital Stay: Payer: 59 | Admitting: Hematology and Oncology

## 2021-10-17 ENCOUNTER — Ambulatory Visit: Payer: 59 | Admitting: Hematology and Oncology

## 2021-10-30 ENCOUNTER — Encounter: Payer: Self-pay | Admitting: Hematology and Oncology

## 2021-10-30 ENCOUNTER — Inpatient Hospital Stay: Payer: 59 | Attending: Hematology and Oncology | Admitting: Hematology and Oncology

## 2021-10-30 VITALS — BP 153/89 | HR 84 | Temp 97.9°F | Resp 16 | Ht 63.0 in | Wt 178.0 lb

## 2021-10-30 DIAGNOSIS — C50411 Malignant neoplasm of upper-outer quadrant of right female breast: Secondary | ICD-10-CM | POA: Diagnosis present

## 2021-10-30 DIAGNOSIS — Z170 Estrogen receptor positive status [ER+]: Secondary | ICD-10-CM | POA: Insufficient documentation

## 2021-10-30 DIAGNOSIS — Z79811 Long term (current) use of aromatase inhibitors: Secondary | ICD-10-CM | POA: Diagnosis not present

## 2021-10-30 NOTE — Progress Notes (Signed)
[facility]  [phone] [phone]     ID: Yolanda [name] DOB: May 31, 1960  MR#: [id number]  RFF#:[id number]  Patient Care Team: Mosie Lukes, MD as PCP - General (Family Medicine) Michel Bickers, MD as Consulting Physician (Infectious Diseases) Megan Salon, MD as Consulting Physician (Gynecology) Magrinat, Virgie Dad, MD (Inactive) as Consulting Physician (Oncology) Juanita Craver, MD as Consulting Physician (Gastroenterology) Stark Klein, MD as Consulting Physician (General Surgery) Mauro Kaufmann, RN as Oncology Nurse Navigator Rockwell Germany, RN as Oncology Nurse Navigator Amedeo Kinsman, OD (Optometry) Benay Pike, MD  CHIEF COMPLAINT: Estrogen receptor positive breast cancer  CURRENT TREATMENT: Anastrazole.   INTERVAL HISTORY:  Yolanda [name] returns today for follow up and treatment of her estrogen receptor positive breast cancer. Yolanda [name] is here by herself.  She does not have a memory of a complications that she mention with anastrozole.  She tells me that she has terrible memory loss and is taking notes for everything lately.  She overall mentions that she has been tolerating anastrozole well.  Besides that she reports some breast pain in the right side, has not seen a lymphedema specialist.   She had a follow-up with the breast surgeon since her last visit and she was told about the lymphedema as well. Rest of the pertinent 10 point ROS reviewed and negative  REVIEW OF SYSTEMS: A detailed review of systems was otherwise stable.   COVID 19 VACCINATION STATUS: fully vaccinated [employer]), with booster 12/2019   HISTORY OF CURRENT ILLNESS: From the original intake note:   I saw Yolanda [name] in December 2018 for evaluation of atypical lobular hyperplasia.  We discussed various strategies regarding breast cancer prevention at that time for her to consider.    More recently (around the time of the Super Bowl 2022) she found a  lump in her right breast.  She underwent bilateral diagnostic mammography with tomography and right breast ultrasonography at [facility] on 04/10/2020 showing: breast density category C; palpable 3 cm mass in right breast at 12 o'clock; no enlarged adenopathy in right axilla.  Accordingly on the same day, she proceeded to biopsy of the right breast area in question. The pathology from this procedure ([id number]) showed: invasive ductal carcinoma, grade 3; ductal carcinoma in situ. Prognostic indicators significant for: estrogen receptor, 90% positive with moderate staining intensity and progesterone receptor, 0% negative. Proliferation marker Ki67 at 25%. HER2 equivocal by immunohistochemistry (2+), but negative by fluorescent in situ hybridization with a signals ratio 1.49 and number per cell 2.75.   Cancer Staging  Malignant neoplasm of upper-outer quadrant of right breast in female, estrogen receptor positive ([place]) Staging form: Breast, AJCC 8th Edition - Clinical: Stage IIA (cT2, cN0, cM0, G3, ER+, PR+, HER2-) - Signed by Chauncey Cruel, MD on 04/18/2020   The patient's subsequent history is as detailed below.   PAST MEDICAL HISTORY: Past Medical History:  Diagnosis Date   Anemia May 2022   Chemotherapy   Asthma    with respiratory infection   Cancer [facility])    breast cancer - right   Cataract March 2018   Optometrist visit   Diabetes mellitus without complication ([place])    type II   Fatty liver    Fibroid uterus    GERD (gastroesophageal reflux disease) June 2021   Hypertension    Patient reports that she has never been diagnosied with HTN, "I take HCTZ for swelling in my feet, if needed."   Osteoarthritis  03/10/2015   PONV (postoperative nausea and vomiting)    2014   Refusal of blood transfusions as patient is Jehovah's Witness    NO BLOOD PRODUCTS   Sleep apnea     PAST SURGICAL HISTORY: Past Surgical History:  Procedure Laterality Date   BREAST CYST ASPIRATION      BREAST EXCISIONAL BIOPSY     BREAST LUMPECTOMY WITH SENTINEL LYMPH NODE BIOPSY Right 05/21/2020   Procedure: RIGHT BREAST LUMPECTOMY WITH SENTINEL LYMPH NODE BIOPSY;  Surgeon: Stark Klein, MD;  Location: [facility];  Service: General;  Laterality: Right;   CHOLECYSTECTOMY  02/2009   COLONOSCOPY     DILATATION & CURRETTAGE/HYSTEROSCOPY WITH RESECTOCOPE N/A 03/13/2014   Procedure: DILATATION & CURETTAGE/HYSTEROSCOPY ;  Surgeon: Lyman Speller, MD;  Location: [facility];  Service: Gynecology;  Laterality: N/A;   PORTACATH PLACEMENT Left 05/21/2020   Procedure: INSERTION PORT-A-CATH;  Surgeon: Stark Klein, MD;  Location: [facility];  Service: General;  Laterality: Left;   RADIOACTIVE SEED GUIDED EXCISIONAL BREAST BIOPSY Left 12/30/2016   Procedure: RADIOACTIVE SEED GUIDED EXCISIONAL BREAST BIOPSY;  Surgeon: Stark Klein, MD;  Location: [facility];  Service: General;  Laterality: Left;    FAMILY HISTORY: Family History  Problem Relation Age of Onset   Asthma Mother    Diabetes Mother    Diabetes Father    Heart attack Father    Heart disease Father    Diabetes Brother    Diabetes Brother    Asthma Brother    Heart Problems Sister    Asthma Sister    Hypertension Sister    Heart disease Maternal Grandfather    Osteopenia Sister   The patient's father died from heart disease at the age of 71.  The patient's mother is 86 years old as of March 2022.  The patient has 4 brothers and 2 sisters.  She is not aware of any history of cancer in her family.   GYNECOLOGIC HISTORY:  Patient's last menstrual period was 02/21/2014. Menarche: 61 years old GX P 0 LMP age 71 Contraceptive 1 year, no complications HRT no  Hysterectomy? no BSO? 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  Her husband [name] works as a Marketing executive for [employer].  He has 2 children from a prior marriage, both living in [place].  One is traveling nurse.  The other 1 is  a Administrator.  The patient is a Restaurant manager, fast food.    ADVANCED DIRECTIVES: In the absence of any documentation to the contrary, the patient's spouse is their HCPOA.  The patient made it clear at her visit 04/18/2020 that she would not accept any blood transfusion or blood products for any reason including to stave off death   HEALTH MAINTENANCE: Social History   Tobacco Use   Smoking status: Never   Smokeless tobacco: Never  Vaping Use   Vaping Use: Never used  Substance Use Topics   Alcohol use: No   Drug use: No     Colonoscopy: 10/2019 (Dr. Collene Mares), recall 2026  PAP: 03/2017, negative  Bone density: 09/2018, -0.8   Allergies  Allergen Reactions   Lisinopril Shortness Of Breath and Cough    wheezing    Current Outpatient Medications  Medication Sig Dispense Refill   albuterol (PROVENTIL HFA;VENTOLIN HFA) 108 (90 Base) MCG/ACT inhaler Inhale 1-2 puffs into the lungs every 4 (four) hours as needed for wheezing or shortness of breath. 18 g 2   anastrozole (  ARIMIDEX) 1 MG tablet Take 1 tablet (1 mg total) by mouth daily. 90 tablet 4   atorvastatin (LIPITOR) 10 MG tablet Take 10 mg by mouth every other day.     B-D ULTRA-FINE 33 LANCETS MISC Check BG 2 times/day     Cholecalciferol 125 MCG (5000 UT) capsule Take 5,000 Units by mouth every other day.     empagliflozin (JARDIANCE) 25 MG TABS tablet Take 1 tablet (25 mg total) by mouth daily before breakfast. 90 tablet 3   fluticasone (FLONASE) 50 MCG/ACT nasal spray Place 1 spray into both nostrils daily as needed for allergies or rhinitis.     loratadine (CLARITIN) 10 MG tablet TAKE 1 TABLET(10 MG) BY MOUTH DAILY 90 tablet 3   metFORMIN (GLUCOPHAGE-XR) 500 MG 24 hr tablet Take 2 tablets (1,000 mg total) by mouth in the morning and at bedtime. 360 tablet 3   Multiple Vitamin (MULTIVITAMIN) LIQD Take 5 mLs by mouth daily.     omeprazole (PRILOSEC) 40 MG capsule Take 1 capsule (40 mg total) by mouth daily. 30 capsule 2   ONETOUCH VERIO  test strip CHECK BLOOD GLUCOSE DAILY 100 strip 3   vitamin C (ASCORBIC ACID) 500 MG tablet Take 500 mg by mouth every other day.     Vitamin D, Ergocalciferol, (DRISDOL) 1.25 MG (50000 UNIT) CAPS capsule Take 1 capsule (50,000 Units total) by mouth every 7 (seven) days. 4 capsule 4   No current facility-administered medications for this visit.   Facility-Administered Medications Ordered in Other Visits  Medication Dose Route Frequency Provider Last Rate Last Admin   sodium chloride flush (NS) 0.9 % injection 10 mL  10 mL Intracatheter PRN Magrinat, Valentino Hue, MD       SUPPLEMENTS: Aside from the medications listed above, the patient takes a teaspoon of olive oil daily, 1000 mg of vitamin C every other day, sunflower lecithin 600 mg every other day, vitamin D3 5000 mg every other day, magnesium 200 mg every other day, milk thistle 150 mg every other day, a B complex vitamin (B6 and B12) every other day, as well as Osteo Bi-Flex weekly and fluticasone as needed   OBJECTIVE:  Vitals:   10/30/21 1207  BP: (!) 153/89  Pulse: 84  Resp: 16  Temp: 97.9 F (36.6 C)  SpO2: 98%       Body mass index is 31.53 kg/m.   Wt Readings from Last 3 Encounters:  10/30/21 178 lb (80.7 kg)  09/30/21 181 lb 3.2 oz (82.2 kg)  09/01/21 181 lb 3.2 oz (82.2 kg)  ECOG FS:1 - Symptomatic but completely ambulatory  Physical Exam Constitutional:      Appearance: Normal appearance.  Chest:     Comments: Bilateral breasts examined.  Postradiation changes along with lymphedema noted in the right breast with some caving of the surgical scar which has been stable.  No other palpable masses or regional adenopathy.  Left breast normal to inspection and palpation. Musculoskeletal:     Cervical back: Normal range of motion and neck supple. No rigidity.  Lymphadenopathy:     Cervical: No cervical adenopathy.  Neurological:     Mental Status: She is alert.      LAB RESULTS:  CMP     Component Value Date/Time    NA 139 09/18/2021 0730   NA 138 11/04/2017 0000   K 4.5 09/18/2021 0730   CL 102 09/18/2021 0730   CO2 29 09/18/2021 0730   GLUCOSE 133 (H) 09/18/2021 0730  BUN 11 09/18/2021 0730   BUN 14 11/04/2017 0000   CREATININE 0.62 09/18/2021 0730   CREATININE 0.63 01/15/2021 0943   CREATININE 0.60 09/07/2014 0950   CALCIUM 10.2 09/18/2021 0730   CALCIUM 10.5 (H) 09/18/2021 0730   CALCIUM 9.7 10/02/2020 1030   PROT 6.7 09/18/2021 0730   ALBUMIN 4.2 09/18/2021 0730   AST 25 09/18/2021 0730   AST 26 01/15/2021 0943   ALT 24 09/18/2021 0730   ALT 27 01/15/2021 0943   ALKPHOS 156 (H) 09/18/2021 0730   ALKPHOS 177 (H) 09/18/2021 0730   BILITOT 0.3 09/18/2021 0730   BILITOT 0.5 01/15/2021 0943   GFRNONAA >60 04/14/2021 0816   GFRNONAA >60 01/15/2021 0943   GFRAA >60 05/15/2019 1803    No results found for: "TOTALPROTELP", "ALBUMINELP", "A1GS", "A2GS", "BETS", "BETA2SER", "GAMS", "MSPIKE", "SPEI"  Lab Results  Component Value Date   WBC 4.7 09/18/2021   NEUTROABS 3.6 05/12/2021   HGB 11.9 (L) 09/18/2021   HCT 37.3 09/18/2021   MCV 91.2 09/18/2021   PLT 222.0 09/18/2021    No results found for: "LABCA2"  No components found for: "ZJIRCV893"  No results for input(s): "INR" in the last 168 hours.  No results found for: "LABCA2"  No results found for: "YBO175"  No results found for: "CAN125"  No results found for: "CAN153"  No results found for: "CA2729"  No components found for: "HGQUANT"  No results found for: "CEA1", "CEA" / No results found for: "CEA1", "CEA"   No results found for: "AFPTUMOR"  No results found for: "CHROMOGRNA"  No results found for: "KPAFRELGTCHN", "LAMBDASER", "KAPLAMBRATIO" (kappa/lambda light chains)  No results found for: "HGBA", "HGBA2QUANT", "HGBFQUANT", "HGBSQUAN" (Hemoglobinopathy evaluation)   No results found for: "LDH"  Lab Results  Component Value Date   IRON 38 (L) 10/16/2009   IRONPCTSAT 10.2 (L) 10/16/2009   (Iron and  TIBC)  Lab Results  Component Value Date   FERRITIN 8.9 (L) 10/16/2009    Urinalysis    Component Value Date/Time   COLORURINE YELLOW 03/11/2009 1100   APPEARANCEUR CLEAR 03/11/2009 1100   LABSPEC 1.017 03/11/2009 1100   PHURINE 7.0 03/11/2009 1100   GLUCOSEU NEGATIVE 03/11/2009 1100   HGBUR LARGE (A) 03/11/2009 1100   BILIRUBINUR N 05/21/2015 1614   KETONESUR NEGATIVE 03/11/2009 1100   PROTEINUR N 05/21/2015 1614   PROTEINUR NEGATIVE 03/11/2009 1100   UROBILINOGEN negative 05/21/2015 1614   UROBILINOGEN 0.2 03/11/2009 1100   NITRITE N 05/21/2015 1614   NITRITE NEGATIVE 03/11/2009 1100   LEUKOCYTESUR Negative 05/21/2015 1614    STUDIES: No results found.   ELIGIBLE FOR AVAILABLE RESEARCH PROTOCOL: no  ASSESSMENT: 61 y.o. [place] woman status post right breast upper outer quadrant biopsy 04/10/2020 for a clinical T2N0, stage IIA invasive ductal carcinoma, grade 3, estrogen receptor positive, progesterone receptor and HER-2 negative, with an MIB-1 of 25%.  (1) Oncotype obtained from the original biopsy shows a score of 48, predicting a recurrence rate outside the breast within 9 years of 28% if the only systemic therapy is antiestrogen for 5 years.  It also predicts a greater than 15% benefit from chemotherapy.  (2) right lumpectomy and sentinel lymph node dissection 05/21/2020 shows a pT2 pN0, stage IIB invasive ductal carcinoma, grade 3, with negative margins.  (3) adjuvant chemotherapy to consist of cyclophosphamide and doxorubicin in dose dense fashion x4 starting 06/18/2020, with Neulasta day 3; followed by paclitaxel weekly x12 started 08/28/2020 through 11/13/2020  (a) echo 05/13/2020 shows an ejection fraction in  the 55-60% range  (4) adjuvant radiation 03/27/2020    PLAN: Patient is here for follow-up on anastrozole.  During her last visit, she reported several issues with anastrozole including bony aches, headaches, hot flashes.  Today she tells me that she has  been tolerating the medication very well.  She cannot remember having the conversation from last time but also admits to severe memory loss.  She appears to have lymphedema and postradiation related changes in her right breast hence have advised physical therapy for lymphedema management.  There appears to be no concern for recurrence on exam today.  Her most recent imaging confirms the same. Most recent bone density with mild osteopenia, encouraged continuing calcium, vitamin D supplementation as tolerated as well as some weightbearing exercises. Return clinic in 6 months or sooner.  Self breast exam recommended monthly.  Total time spent: 30 minutes  *Total Encounter Time as defined by the Centers for Medicare and Medicaid Services includes, in addition to the face-to-face time of a patient visit (documented in the note above) non-face-to-face time: obtaining and reviewing outside history, ordering and reviewing medications, tests or procedures, care coordination (communications with other health care professionals or caregivers) and documentation in the medical record.

## 2021-12-05 NOTE — Progress Notes (Unsigned)
Office Visit Note  Patient: Yolanda [name]             Date of Birth: Apr 30, 1960           MRN: [id number]             PCP: Mosie Lukes, MD Referring: Mosie Lukes, MD Visit Date: 12/09/2021 Occupation: @GUAROCC @  Subjective:  Pain in both hands  History of Present Illness: Yolanda [name] is a 61 y.o. female seen in consultation per request of her PCP for the evaluation of pain in her hands.  According the patient she has had pain in her hands for last 5 years.  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  She states that she notices swelling in her right little finger sometimes.  She has noticed progressive increase involvement of the other joints in her hands over the years.  She states she was seen by Dr. Charlett Blake in March 2023 at the time she had lab work.  Her labs showed positive ANA and she was referred to me for further evaluation.  None of the other joints are painful.  There is no history of oral ulcers, nasal ulcers, malar rash, photosensitivity, Raynaud's, lymphadenopathy.  She gives history of dry eyes.  There is no family history of autoimmune disease.  Gravida 0.  No history of DVTs.  Activities of Daily Living:  Patient reports morning stiffness for 0 minutes.   Patient Denies nocturnal pain.  Difficulty dressing/grooming: Denies Difficulty climbing stairs: Denies Difficulty getting out of chair: Denies Difficulty using hands for taps, buttons, cutlery, and/or writing: Reports  Review of Systems  Constitutional:  Positive for fatigue.  HENT:  Negative for mouth sores and mouth dryness.   Eyes:  Positive for dryness.  Respiratory:  Negative for shortness of breath.   Cardiovascular:  Negative for chest pain and palpitations.  Gastrointestinal:  Negative for blood in stool, constipation and diarrhea.  Endocrine: Negative for increased urination.  Genitourinary:  Negative for involuntary urination.  Musculoskeletal:  Positive for joint pain, joint  pain, joint swelling, myalgias, muscle tenderness and myalgias. Negative for gait problem, muscle weakness and morning stiffness.  Skin:  Positive for hair loss. Negative for color change, rash and sensitivity to sunlight.  Allergic/Immunologic: Negative for susceptible to infections.  Neurological:  Positive for headaches. Negative for dizziness.  Hematological:  Negative for swollen glands.  Psychiatric/Behavioral:  Negative for depressed mood and sleep disturbance. The patient is not nervous/anxious.     PMFS History:  Patient Active Problem List   Diagnosis Date Noted   Right ankle pain 05/12/2021   Bilateral hand pain 05/12/2021   Type 2 diabetes mellitus without complication, without long-term current use of insulin ([place]) 05/09/2021   Preventative health care 07/16/2020   Burning sensation of eye 07/16/2020   Port-A-Cath in place 06/18/2020   Malignant neoplasm of upper-outer quadrant of right breast in female, estrogen receptor positive ([place]) 04/18/2020   Hypercalcemia 04/10/2020   Breast lesion 04/10/2020   Diverticulosis of sigmoid colon 11/15/2019   Paresthesia and pain of right extremity 02/05/2019   Fatty liver 01/31/2018   Atypical lobular hyperplasia [facility]) of left breast 02/01/2017   Fibroadenoma 01/22/2017   Hyperlipidemia 06/12/2015   Diabetes mellitus type 2 in obese ([place]) 02/23/2014   Elevated alkaline phosphatase level 02/23/2014   Uterine fibroid 02/23/2014   OBESITY 11/26/2009   OBSTRUCTIVE SLEEP APNEA 06/01/2008   Essential hypertension 06/01/2008    Past Medical  History:  Diagnosis Date   Anemia May 2022   Chemotherapy   Asthma    with respiratory infection   Cancer [facility])    breast cancer - right   Cataract March 2018   Optometrist visit   Diabetes mellitus without complication ([place])    type II   Fatty liver    Fibroid uterus    GERD (gastroesophageal reflux disease) June 2021   Hypertension    Patient reports that she has never been diagnosied  with HTN, "I take HCTZ for swelling in my feet, if needed."   Osteoarthritis 03/10/2015   PONV (postoperative nausea and vomiting)    2014   Refusal of blood transfusions as patient is Jehovah's Witness    NO BLOOD PRODUCTS   Sleep apnea     Family History  Problem Relation Age of Onset   Asthma Mother    Diabetes Mother    Diabetes Father    Heart attack Father    Heart disease Father    Heart Problems Sister    Asthma Sister    Hypertension Sister    Diabetes Brother    Diabetes Brother    Asthma Brother    Heart disease Maternal Grandfather    Past Surgical History:  Procedure Laterality Date   BREAST CYST ASPIRATION     BREAST EXCISIONAL BIOPSY     BREAST LUMPECTOMY WITH SENTINEL LYMPH NODE BIOPSY Right 05/21/2020   Procedure: RIGHT BREAST LUMPECTOMY WITH SENTINEL LYMPH NODE BIOPSY;  Surgeon: Stark Klein, MD;  Location: [place];  Service: General;  Laterality: Right;   CHOLECYSTECTOMY  02/2009   COLONOSCOPY     DILATATION & CURRETTAGE/HYSTEROSCOPY WITH RESECTOCOPE N/A 03/13/2014   Procedure: DILATATION & CURETTAGE/HYSTEROSCOPY ;  Surgeon: Lyman Speller, MD;  Location: [facility];  Service: Gynecology;  Laterality: N/A;   PORTACATH PLACEMENT Left 05/21/2020   Procedure: INSERTION PORT-A-CATH;  Surgeon: Stark Klein, MD;  Location: [place];  Service: General;  Laterality: Left;   RADIOACTIVE SEED GUIDED EXCISIONAL BREAST BIOPSY Left 12/30/2016   Procedure: RADIOACTIVE SEED GUIDED EXCISIONAL BREAST BIOPSY;  Surgeon: Stark Klein, MD;  Location: [place];  Service: General;  Laterality: Left;   Social History   Social History Narrative   Not on file   Immunization History  Administered Date(s) Administered   Fluad Quad(high Dose 65+) 12/05/2020   Influenza,inj,Quad PF,6+ Mos 03/03/2018   Influenza-Unspecified 12/18/2019   PFIZER(Purple Top)SARS-COV-2 Vaccination 05/30/2019, 06/20/2019, 12/25/2019   Pneumococcal Conjugate-13 06/10/2015    Pneumococcal Polysaccharide-23 01/31/2018   Td 07/21/2006   Tdap 01/22/2017   Zoster Recombinat (Shingrix) 05/12/2021, 09/01/2021     Objective: Vital Signs: BP (!) 172/101 (BP Location: Left Arm, Patient Position: Sitting, Cuff Size: Normal)   Pulse 76   Resp 15   Ht $R'5\' 3"'RB$  (1.6 m)   Wt 176 lb 3.2 oz (79.9 kg)   LMP 02/21/2014   BMI 31.21 kg/m    Physical Exam Vitals and nursing note reviewed.  Constitutional:      Appearance: She is well-developed.  HENT:     Head: Normocephalic and atraumatic.  Eyes:     Conjunctiva/sclera: Conjunctivae normal.  Cardiovascular:     Rate and Rhythm: Normal rate and regular rhythm.     Heart sounds: Normal heart sounds.  Pulmonary:     Effort: Pulmonary effort is normal.     Breath sounds: Normal breath sounds.  Abdominal:     General: Bowel sounds are normal.     Palpations:  Abdomen is soft.  Musculoskeletal:     Cervical back: Normal range of motion.  Lymphadenopathy:     Cervical: No cervical adenopathy.  Skin:    General: Skin is warm and dry.     Capillary Refill: Capillary refill takes less than 2 seconds.  Neurological:     Mental Status: She is alert and oriented to person, place, and time.  Psychiatric:        Behavior: Behavior normal.      Musculoskeletal Exam: Cervical spine was in good range of motion.  She had no tenderness over thoracic or lumbar spine.  Shoulder joints, elbow joints, wrist joints, MCPs with good range of motion.  She had bilateral PIP and DIP thickening.  Right third MCP thickening was noted without any tenderness.  She had flexion contracture of bilateral fourth DIP joint.  Hip joints and knee joints were in good range of motion.  She had bunionectomy scar on her left first MTP.  She had right first MTP bunion and dorsal spurring was noted.  CDAI Exam: CDAI Score: -- Patient Global: --; Provider Global: -- Swollen: --; Tender: -- Joint Exam 12/09/2021   No joint exam has been documented for this  visit   There is currently no information documented on the homunculus. Go to the Rheumatology activity and complete the homunculus joint exam.  Investigation: No additional findings.  Imaging: No results found.  Recent Labs: Lab Results  Component Value Date   WBC 4.7 09/18/2021   HGB 11.9 (L) 09/18/2021   PLT 222.0 09/18/2021   NA 139 09/18/2021   K 4.5 09/18/2021   CL 102 09/18/2021   CO2 29 09/18/2021   GLUCOSE 133 (H) 09/18/2021   BUN 11 09/18/2021   CREATININE 0.62 09/18/2021   BILITOT 0.3 09/18/2021   ALKPHOS 156 (H) 09/18/2021   ALKPHOS 177 (H) 09/18/2021   AST 25 09/18/2021   ALT 24 09/18/2021   PROT 6.7 09/18/2021   ALBUMIN 4.2 09/18/2021   CALCIUM 10.2 09/18/2021   CALCIUM 10.5 (H) 09/18/2021   GFRAA >60 05/15/2019    Speciality Comments: No specialty comments available.  Procedures:  No procedures performed Allergies: Lisinopril   Assessment / Plan:     Visit Diagnoses: Positive ANA (antinuclear antibody) - 05/12/21: ANA 1:40NH, TSH 2.38, RF<14, ESR 28.  Her ANA is low titer and not significant.  She denies any history of oral ulcers, nasal ulcers, malar rash, photosensitivity, Raynaud's phenomenon, inflammatory arthritis or lymphadenopathy.  She gives history of dry eyes which most likely is related to the medication use.  Bilateral hand pain -she complains of pain and discomfort in her bilateral hands over the last 5 years.  She states gradually more joints in her hands have been getting involved.  She had bilateral fifth finger DIP flexion contracture.  A mucinous cyst was also noted over the right fifth finger DIP joint.  Thickening of the right third MCP joint without any tenderness or swelling was noted.  None of the other MCP joints were involved.  There was no tenderness over wrist joints.  No synovitis was noted.  Clinical findings are consistent with osteoarthritis.  Rheumatoid factor was negative, sed rate was normal.  Plan: XR Hand 2 View Right, XR  Hand 2 View Left.  X-rays of bilateral hands were consistent with osteoarthritis.  X-ray findings were reviewed with the patient.  A handout on exercises was given.  Joint protection muscle strengthening was discussed.  I offered physical therapy but patient  declined.  Primary osteoarthritis of both feet-she had left foot bunionectomy in the past.  Bunion was noted on her right first MTP.  Dorsal spurring was also noted.  She does not have much discomfort in her feet.  Proper fitting shoes were advised.  Essential hypertension-blood pressure was elevated today.  She was advised to monitor blood pressure closely.  History of hyperlipidemia  Fatty liver-alkaline phosphatase is elevated.  AST and ALT are normal.  Patient has an appointment coming up with Dr. Collene Mares.  Type 2 diabetes mellitus without complication, without long-term current use of insulin (H[facility])  Malignant neoplasm of upper-outer quadrant of right breast in female, estrogen receptor positive ([place]) - 2021, lumpectomy, CTX and RTX.On Anastrazole since 04/2021.  Diverticulosis of sigmoid colon  OSA (obstructive sleep apnea)  Osteopenia of multiple sites - 09/15/2021 DEXA scan showed the BMD measured at Femur Neck Left is 0.881 g/cm2 with a T-score of -1.1.  Use of calcium rich diet and exercise was emphasized.  Orders: Orders Placed This Encounter  Procedures   XR Hand 2 View Right   XR Hand 2 View Left   No orders of the defined types were placed in this encounter.    Follow-Up Instructions: Return for Osteoarthritis.   Bo Merino, MD  Note - This record has been created using Editor, commissioning.  Chart creation errors have been sought, but may not always  have been located. Such creation errors do not reflect on  the standard of medical care.

## 2021-12-09 ENCOUNTER — Ambulatory Visit (INDEPENDENT_AMBULATORY_CARE_PROVIDER_SITE_OTHER): Payer: 59

## 2021-12-09 ENCOUNTER — Encounter: Payer: Self-pay | Admitting: Rheumatology

## 2021-12-09 ENCOUNTER — Ambulatory Visit: Payer: 59 | Attending: Rheumatology | Admitting: Rheumatology

## 2021-12-09 VITALS — BP 172/101 | HR 76 | Resp 15 | Ht 63.0 in | Wt 176.2 lb

## 2021-12-09 DIAGNOSIS — R768 Other specified abnormal immunological findings in serum: Secondary | ICD-10-CM | POA: Diagnosis not present

## 2021-12-09 DIAGNOSIS — K5730 Diverticulosis of large intestine without perforation or abscess without bleeding: Secondary | ICD-10-CM

## 2021-12-09 DIAGNOSIS — Z8639 Personal history of other endocrine, nutritional and metabolic disease: Secondary | ICD-10-CM

## 2021-12-09 DIAGNOSIS — M79642 Pain in left hand: Secondary | ICD-10-CM

## 2021-12-09 DIAGNOSIS — M19071 Primary osteoarthritis, right ankle and foot: Secondary | ICD-10-CM | POA: Diagnosis not present

## 2021-12-09 DIAGNOSIS — Z95828 Presence of other vascular implants and grafts: Secondary | ICD-10-CM

## 2021-12-09 DIAGNOSIS — I10 Essential (primary) hypertension: Secondary | ICD-10-CM | POA: Diagnosis not present

## 2021-12-09 DIAGNOSIS — M79641 Pain in right hand: Secondary | ICD-10-CM

## 2021-12-09 DIAGNOSIS — E119 Type 2 diabetes mellitus without complications: Secondary | ICD-10-CM

## 2021-12-09 DIAGNOSIS — K760 Fatty (change of) liver, not elsewhere classified: Secondary | ICD-10-CM

## 2021-12-09 DIAGNOSIS — G4733 Obstructive sleep apnea (adult) (pediatric): Secondary | ICD-10-CM

## 2021-12-09 DIAGNOSIS — M79609 Pain in unspecified limb: Secondary | ICD-10-CM

## 2021-12-09 DIAGNOSIS — M19072 Primary osteoarthritis, left ankle and foot: Secondary | ICD-10-CM

## 2021-12-09 DIAGNOSIS — Z170 Estrogen receptor positive status [ER+]: Secondary | ICD-10-CM

## 2021-12-09 DIAGNOSIS — M8589 Other specified disorders of bone density and structure, multiple sites: Secondary | ICD-10-CM

## 2021-12-09 DIAGNOSIS — C50411 Malignant neoplasm of upper-outer quadrant of right female breast: Secondary | ICD-10-CM

## 2021-12-09 DIAGNOSIS — R7689 Other specified abnormal immunological findings in serum: Secondary | ICD-10-CM

## 2021-12-09 NOTE — Patient Instructions (Signed)
Hand Exercises Hand exercises can be helpful for almost anyone. These exercises can strengthen the hands, improve flexibility and movement, and increase blood flow to the hands. These results can make work and daily tasks easier. Hand exercises can be especially helpful for people who have joint pain from arthritis or have nerve damage from overuse (carpal tunnel syndrome). These exercises can also help people who have injured a hand. Exercises Most of these hand exercises are gentle stretching and motion exercises. It is usually safe to do them often throughout the day. Warming up your hands before exercise may help to reduce stiffness. You can do this with gentle massage or by placing your hands in warm water for 10-15 minutes. It is normal to feel some stretching, pulling, tightness, or mild discomfort as you begin new exercises. This will gradually improve. Stop an exercise right away if you feel sudden, severe pain or your pain gets worse. Ask your health care provider which exercises are best for you. Knuckle bend or "claw" fist  Stand or sit with your arm, hand, and all five fingers pointed straight up. Make sure to keep your wrist straight during the exercise. Gently bend your fingers down toward your palm until the tips of your fingers are touching the top of your palm. Keep your big knuckle straight and just bend the small knuckles in your fingers. Hold this position for __________ seconds. Straighten (extend) your fingers back to the starting position. Repeat this exercise 5-10 times with each hand. Full finger fist  Stand or sit with your arm, hand, and all five fingers pointed straight up. Make sure to keep your wrist straight during the exercise. Gently bend your fingers into your palm until the tips of your fingers are touching the middle of your palm. Hold this position for __________ seconds. Extend your fingers back to the starting position, stretching every joint fully. Repeat  this exercise 5-10 times with each hand. Straight fist Stand or sit with your arm, hand, and all five fingers pointed straight up. Make sure to keep your wrist straight during the exercise. Gently bend your fingers at the big knuckle, where your fingers meet your hand, and the middle knuckle. Keep the knuckle at the tips of your fingers straight and try to touch the bottom of your palm. Hold this position for __________ seconds. Extend your fingers back to the starting position, stretching every joint fully. Repeat this exercise 5-10 times with each hand. Tabletop  Stand or sit with your arm, hand, and all five fingers pointed straight up. Make sure to keep your wrist straight during the exercise. Gently bend your fingers at the big knuckle, where your fingers meet your hand, as far down as you can while keeping the small knuckles in your fingers straight. Think of forming a tabletop with your fingers. Hold this position for __________ seconds. Extend your fingers back to the starting position, stretching every joint fully. Repeat this exercise 5-10 times with each hand. Finger spread  Place your hand flat on a table with your palm facing down. Make sure your wrist stays straight as you do this exercise. Spread your fingers and thumb apart from each other as far as you can until you feel a gentle stretch. Hold this position for __________ seconds. Bring your fingers and thumb tight together again. Hold this position for __________ seconds. Repeat this exercise 5-10 times with each hand. Making circles  Stand or sit with your arm, hand, and all five fingers pointed   straight up. Make sure to keep your wrist straight during the exercise. Make a circle by touching the tip of your thumb to the tip of your index finger. Hold for __________ seconds. Then open your hand wide. Repeat this motion with your thumb and each finger on your hand. Repeat this exercise 5-10 times with each hand. Thumb  motion  Sit with your forearm resting on a table and your wrist straight. Your thumb should be facing up toward the ceiling. Keep your fingers relaxed as you move your thumb. Lift your thumb up as high as you can toward the ceiling. Hold for __________ seconds. Bend your thumb across your palm as far as you can, reaching the tip of your thumb for the small finger (pinkie) side of your palm. Hold for __________ seconds. Repeat this exercise 5-10 times with each hand. Grip strengthening  Hold a stress ball or other soft ball in the middle of your hand. Slowly increase the pressure, squeezing the ball as much as you can without causing pain. Think of bringing the tips of your fingers into the middle of your palm. All of your finger joints should bend when doing this exercise. Hold your squeeze for __________ seconds, then relax. Repeat this exercise 5-10 times with each hand. Contact a health care provider if: Your hand pain or discomfort gets much worse when you do an exercise. Your hand pain or discomfort does not improve within 2 hours after you exercise. If you have any of these problems, stop doing these exercises right away. Do not do them again unless your health care provider says that you can. Get help right away if: You develop sudden, severe hand pain or swelling. If this happens, stop doing these exercises right away. Do not do them again unless your health care provider says that you can. This information is not intended to replace advice given to you by your health care provider. Make sure you discuss any questions you have with your health care provider. Document Revised: 05/23/2020 Document Reviewed: 05/23/2020 Elsevier Patient Education  2023 [employer]  

## 2021-12-23 ENCOUNTER — Other Ambulatory Visit: Payer: Self-pay | Admitting: Gastroenterology

## 2021-12-23 DIAGNOSIS — R7989 Other specified abnormal findings of blood chemistry: Secondary | ICD-10-CM

## 2021-12-24 ENCOUNTER — Ambulatory Visit: Payer: 59 | Admitting: Rheumatology

## 2021-12-29 ENCOUNTER — Ambulatory Visit
Admission: RE | Admit: 2021-12-29 | Discharge: 2021-12-29 | Disposition: A | Discharge status: Home / Self Care | Payer: 59 | Source: Ambulatory Visit | Attending: Gastroenterology | Admitting: Gastroenterology

## 2021-12-29 DIAGNOSIS — R7989 Other specified abnormal findings of blood chemistry: Secondary | ICD-10-CM

## 2022-01-04 NOTE — Assessment & Plan Note (Signed)
Encouraged DASH or MIND diet, decrease po intake and increase exercise as tolerated. Needs 7-8 hours of sleep nightly. Avoid trans fats, eat small, frequent meals every 4-5 hours with lean proteins, complex carbs and healthy fats. Minimize simple carbs, high fat foods and processed foods 

## 2022-01-04 NOTE — Assessment & Plan Note (Signed)
Following with oncology and tolerating Arimidex

## 2022-01-04 NOTE — Assessment & Plan Note (Signed)
Tolerating statin, encouraged heart healthy diet, avoid trans fats, minimize simple carbs and saturated fats. Increase exercise as tolerated 

## 2022-01-04 NOTE — Assessment & Plan Note (Signed)
hgba1c acceptable, minimize simple carbs. Increase exercise as tolerated. Continue current meds 

## 2022-01-04 NOTE — Assessment & Plan Note (Signed)
Well controlled, no changes to meds. Encouraged heart healthy diet such as the DASH diet and exercise as tolerated.  °

## 2022-01-05 ENCOUNTER — Ambulatory Visit (INDEPENDENT_AMBULATORY_CARE_PROVIDER_SITE_OTHER): Payer: 59 | Admitting: Family Medicine

## 2022-01-05 ENCOUNTER — Other Ambulatory Visit (HOSPITAL_COMMUNITY): Payer: Self-pay | Admitting: Gastroenterology

## 2022-01-05 VITALS — BP 138/80 | HR 90 | Temp 98.0°F | Resp 16 | Ht 63.0 in | Wt 179.2 lb

## 2022-01-05 DIAGNOSIS — R748 Abnormal levels of other serum enzymes: Secondary | ICD-10-CM

## 2022-01-05 DIAGNOSIS — E1169 Type 2 diabetes mellitus with other specified complication: Secondary | ICD-10-CM | POA: Diagnosis not present

## 2022-01-05 DIAGNOSIS — C50411 Malignant neoplasm of upper-outer quadrant of right female breast: Secondary | ICD-10-CM | POA: Diagnosis not present

## 2022-01-05 DIAGNOSIS — I890 Lymphedema, not elsewhere classified: Secondary | ICD-10-CM

## 2022-01-05 DIAGNOSIS — E119 Type 2 diabetes mellitus without complications: Secondary | ICD-10-CM

## 2022-01-05 DIAGNOSIS — Z170 Estrogen receptor positive status [ER+]: Secondary | ICD-10-CM

## 2022-01-05 DIAGNOSIS — E785 Hyperlipidemia, unspecified: Secondary | ICD-10-CM | POA: Diagnosis not present

## 2022-01-05 DIAGNOSIS — I10 Essential (primary) hypertension: Secondary | ICD-10-CM | POA: Diagnosis not present

## 2022-01-05 DIAGNOSIS — E669 Obesity, unspecified: Secondary | ICD-10-CM | POA: Diagnosis not present

## 2022-01-05 DIAGNOSIS — R7989 Other specified abnormal findings of blood chemistry: Secondary | ICD-10-CM

## 2022-01-05 DIAGNOSIS — K760 Fatty (change of) liver, not elsewhere classified: Secondary | ICD-10-CM

## 2022-01-05 LAB — COMPREHENSIVE METABOLIC PANEL
ALT: 20 U/L (ref 0–35)
AST: 20 U/L (ref 0–37)
Albumin: 4.2 g/dL (ref 3.5–5.2)
Alkaline Phosphatase: 141 U/L — ABNORMAL HIGH (ref 39–117)
BUN: 13 mg/dL (ref 6–23)
CO2: 28 mEq/L (ref 19–32)
Calcium: 10.4 mg/dL (ref 8.4–10.5)
Chloride: 102 mEq/L (ref 96–112)
Creatinine, Ser: 0.62 mg/dL (ref 0.40–1.20)
GFR: 95.27 mL/min (ref >60.00)
Glucose, Bld: 100 mg/dL — ABNORMAL HIGH (ref 70–99)
Potassium: 4.4 mEq/L (ref 3.5–5.1)
Sodium: 139 mEq/L (ref 135–145)
Total Bilirubin: 0.3 mg/dL (ref 0.2–1.2)
Total Protein: 6.7 g/dL (ref 6.0–8.3)

## 2022-01-05 LAB — LIPID PANEL
Cholesterol: 199 mg/dL (ref 0–200)
HDL: 64.6 mg/dL (ref >39.00)
LDL Cholesterol: 106 mg/dL — ABNORMAL HIGH (ref 0–99)
NonHDL: 134.65
Total CHOL/HDL Ratio: 3
Triglycerides: 145 mg/dL (ref 0.0–149.0)
VLDL: 29 mg/dL (ref 0.0–40.0)

## 2022-01-05 LAB — HEMOGLOBIN A1C: Hgb A1c MFr Bld: 6.9 % — ABNORMAL HIGH (ref 4.6–6.5)

## 2022-01-05 LAB — CBC
HCT: 38.7 % (ref 36.0–46.0)
Hemoglobin: 12.7 g/dL (ref 12.0–15.0)
MCHC: 32.8 g/dL (ref 30.0–36.0)
MCV: 93 fl (ref 78.0–100.0)
Platelets: 270 10*3/uL (ref 150.0–400.0)
RBC: 4.16 Mil/uL (ref 3.87–5.11)
RDW: 14.5 % (ref 11.5–15.5)
WBC: 6.4 10*3/uL (ref 4.0–10.5)

## 2022-01-05 LAB — TSH: TSH: 1.96 u[IU]/mL (ref 0.35–5.50)

## 2022-01-05 NOTE — Assessment & Plan Note (Signed)
Swelling and pain started in right arm last week. Was 10 of ten and is now 3 of 10 at rest and 5 of 10 with movement. Try massage, elevation, ice, heat, topical rubs report if worse. Is on side of breast cancer

## 2022-01-05 NOTE — Assessment & Plan Note (Signed)
Following with GI Dr Collene Mares now. Requesting records today.

## 2022-01-05 NOTE — Patient Instructions (Addendum)
RSV (respiratory syncitial virus) vaccine at pharmacy Covid booster when new version out late September At pharmacy High dose flu shot mid Sept to mid Oct   Hypertension, Adult High blood pressure (hypertension) is when the force of blood pumping through the arteries is too strong. The arteries are the blood vessels that carry blood from the heart throughout the body. Hypertension forces the heart to work harder to pump blood and may cause arteries to become narrow or stiff. Untreated or uncontrolled hypertension can lead to a heart attack, heart failure, a stroke, kidney disease, and other problems. A blood pressure reading consists of a higher number over a lower number. Ideally, your blood pressure should be below 120/80. The first ("top") number is called the systolic pressure. It is a measure of the pressure in your arteries as your heart beats. The second ("bottom") number is called the diastolic pressure. It is a measure of the pressure in your arteries as the heart relaxes. What are the causes? The exact cause of this condition is not known. There are some conditions that result in high blood pressure. What increases the risk? Certain factors may make you more likely to develop high blood pressure. Some of these risk factors are under your control, including: Smoking. Not getting enough exercise or physical activity. Being overweight. Having too much fat, sugar, calories, or salt (sodium) in your diet. Drinking too much alcohol. Other risk factors include: Having a personal history of heart disease, diabetes, high cholesterol, or kidney disease. Stress. Having a family history of high blood pressure and high cholesterol. Having obstructive sleep apnea. Age. The risk increases with age. What are the signs or symptoms? High blood pressure may not cause symptoms. Very high blood pressure (hypertensive crisis) may cause: Headache. Fast or irregular heartbeats (palpitations). Shortness  of breath. Nosebleed. Nausea and vomiting. Vision changes. Severe chest pain, dizziness, and seizures. How is this diagnosed? This condition is diagnosed by measuring your blood pressure while you are seated, with your arm resting on a flat surface, your legs uncrossed, and your feet flat on the floor. The cuff of the blood pressure monitor will be placed directly against the skin of your upper arm at the level of your heart. Blood pressure should be measured at least twice using the same arm. Certain conditions can cause a difference in blood pressure between your right and left arms. If you have a high blood pressure reading during one visit or you have normal blood pressure with other risk factors, you may be asked to: Return on a different day to have your blood pressure checked again. Monitor your blood pressure at home for 1 week or longer. If you are diagnosed with hypertension, you may have other blood or imaging tests to help your health care provider understand your overall risk for other conditions. How is this treated? This condition is treated by making healthy lifestyle changes, such as eating healthy foods, exercising more, and reducing your alcohol intake. You may be referred for counseling on a healthy diet and physical activity. Your health care provider may prescribe medicine if lifestyle changes are not enough to get your blood pressure under control and if: Your systolic blood pressure is above 130. Your diastolic blood pressure is above 80. Your personal target blood pressure may vary depending on your medical conditions, your age, and other factors. Follow these instructions at home: Eating and drinking  Eat a diet that is high in fiber and potassium, and low in sodium,  added sugar, and fat. An example of this eating plan is called the DASH diet. DASH stands for Dietary Approaches to Stop Hypertension. To eat this way: Eat plenty of fresh fruits and vegetables. Try to fill  one half of your plate at each meal with fruits and vegetables. Eat whole grains, such as whole-wheat pasta, brown rice, or whole-grain bread. Fill about one fourth of your plate with whole grains. Eat or drink low-fat dairy products, such as skim milk or low-fat yogurt. Avoid fatty cuts of meat, processed or cured meats, and poultry with skin. Fill about one fourth of your plate with lean proteins, such as fish, chicken without skin, beans, eggs, or tofu. Avoid pre-made and processed foods. These tend to be higher in sodium, added sugar, and fat. Reduce your daily sodium intake. Many people with hypertension should eat less than 1,500 mg of sodium a day. Do not drink alcohol if: Your health care provider tells you not to drink. You are pregnant, may be pregnant, or are planning to become pregnant. If you drink alcohol: Limit how much you have to: 0-1 drink a day for women. 0-2 drinks a day for men. Know how much alcohol is in your drink. In the [place] one drink equals one 12 oz bottle of beer (355 mL), one 5 oz glass of wine (148 mL), or one 1 oz glass of hard liquor (44 mL). Lifestyle  Work with your health care provider to maintain a healthy body weight or to lose weight. Ask what an ideal weight is for you. Get at least 30 minutes of exercise that causes your heart to beat faster (aerobic exercise) most days of the week. Activities may include walking, swimming, or biking. Include exercise to strengthen your muscles (resistance exercise), such as Pilates or lifting weights, as part of your weekly exercise routine. Try to do these types of exercises for 30 minutes at least 3 days a week. Do not use any products that contain nicotine or tobacco. These products include cigarettes, chewing tobacco, and vaping devices, such as e-cigarettes. If you need help quitting, ask your health care provider. Monitor your blood pressure at home as told by your health care provider. Keep all follow-up visits.  This is important. Medicines Take over-the-counter and prescription medicines only as told by your health care provider. Follow directions carefully. Blood pressure medicines must be taken as prescribed. Do not skip doses of blood pressure medicine. Doing this puts you at risk for problems and can make the medicine less effective. Ask your health care provider about side effects or reactions to medicines that you should watch for. Contact a health care provider if you: Think you are having a reaction to a medicine you are taking. Have headaches that keep coming back (recurring). Feel dizzy. Have swelling in your ankles. Have trouble with your vision. Get help right away if you: Develop a severe headache or confusion. Have unusual weakness or numbness. Feel faint. Have severe pain in your chest or abdomen. Vomit repeatedly. Have trouble breathing. These symptoms may be an emergency. Get help right away. Call 911. Do not wait to see if the symptoms will go away. Do not drive yourself to the hospital. Summary Hypertension is when the force of blood pumping through your arteries is too strong. If this condition is not controlled, it may put you at risk for serious complications. Your personal target blood pressure may vary depending on your medical conditions, your age, and other factors. For most people,  a normal blood pressure is less than 120/80. Hypertension is treated with lifestyle changes, medicines, or a combination of both. Lifestyle changes include losing weight, eating a healthy, low-sodium diet, exercising more, and limiting alcohol. This information is not intended to replace advice given to you by your health care provider. Make sure you discuss any questions you have with your health care provider. Document Revised: 12/10/2020 Document Reviewed: 12/10/2020 Elsevier Patient Education  [employer].

## 2022-01-05 NOTE — Progress Notes (Addendum)
Subjective:   By signing my name below, I, Carylon Perches, attest that this documentation has been prepared under the direction and in the presence of Willette Alma MD, 01/05/2022    Patient ID: Yolanda [name], female    DOB: January 13, 1961, 61 y.o.   MRN: [id number]  Chief Complaint  Patient presents with   Follow-up    Here for follow up    HPI Patient is in today for an office visit  Gastroenterology She reports that she was seen by Dr.Mann for gastroenterology. Blood work was done. She states that her specialist was concerned about her results and was therefore sent for a liver ultrasound. She was informed that she has fatty liver. She was previously diagnosed in 2019 for fatty liver. She states that she is recommended to be sent for a liver biopsy.   Bowel Habits She reports a history of stomachaches. She states that in a three day span, she is constipated. Pain radiates from the top to the bottom. After those three days, she reports diarrhea about 5 times. Her bowel movement is normal for a few days after until the cycle of constipation and diarrhea occurs again. She states that symptoms appeared in 09/2021. She does not take fiber supplements because it causes her more constipation. Her water intake is between 80-100 ounces. She does take Miralax  Pain in Right Forearm She complains of pain in her right forearm that radiates up her arm. She states that symptoms appeared last week. She describes the pain as sharp. When symptoms initially appeared, she rated the pain about a 10 at rest. As of today's visit, she states that symptoms are subsiding. At rest, pain is about a 3 but when she mobilizes it, she rates pain as a 5. She denies of any injuries, erythema or heat. She notes that symptoms are on the side of her right breast where surgery occurred.   ANA She reports that she was seen by a rheumatologist for her ANA results. She states that she as diagnosed with osteoarthritis.    Immunizations She is UTD on her shingles and tetanus vaccines. She reports that she is UTD on the influenza vaccine.   She reports no recent febrile illness or hospitalizations. Denies CP/ palp/ SOB/ HA/ congestion/fevers/GI or GU c/o. Taking meds as prescribed.   Past Medical History:  Diagnosis Date   Anemia May 2022   Chemotherapy   Asthma    with respiratory infection   Cancer [facility])    breast cancer - right   Cataract March 2018   Optometrist visit   Diabetes mellitus without complication ([place])    type II   Fatty liver    Fibroid uterus    GERD (gastroesophageal reflux disease) June 2021   Hypertension    Patient reports that she has never been diagnosied with HTN, "I take HCTZ for swelling in my feet, if needed."   Osteoarthritis 03/10/2015   PONV (postoperative nausea and vomiting)    2014   Refusal of blood transfusions as patient is Jehovah's Witness    NO BLOOD PRODUCTS   Sleep apnea     Past Surgical History:  Procedure Laterality Date   BREAST CYST ASPIRATION     BREAST EXCISIONAL BIOPSY     BREAST LUMPECTOMY WITH SENTINEL LYMPH NODE BIOPSY Right 05/21/2020   Procedure: RIGHT BREAST LUMPECTOMY WITH SENTINEL LYMPH NODE BIOPSY;  Surgeon: Stark Klein, MD;  Location: [place];  Service: General;  Laterality: Right;   CHOLECYSTECTOMY  02/2009   COLONOSCOPY     DILATATION & CURRETTAGE/HYSTEROSCOPY WITH RESECTOCOPE N/A 03/13/2014   Procedure: DILATATION & CURETTAGE/HYSTEROSCOPY ;  Surgeon: Lyman Speller, MD;  Location: [facility];  Service: Gynecology;  Laterality: N/A;   PORTACATH PLACEMENT Left 05/21/2020   Procedure: INSERTION PORT-A-CATH;  Surgeon: Stark Klein, MD;  Location: [place] [facility];  Service: General;  Laterality: Left;   RADIOACTIVE SEED GUIDED EXCISIONAL BREAST BIOPSY Left 12/30/2016   Procedure: RADIOACTIVE SEED GUIDED EXCISIONAL BREAST BIOPSY;  Surgeon: Stark Klein, MD;  Location: [place];  Service: General;  Laterality: Left;     Family History  Problem Relation Age of Onset   Asthma Mother    Diabetes Mother    Diabetes Father    Heart attack Father    Heart disease Father    Heart Problems Sister    Asthma Sister    Hypertension Sister    Diabetes Brother    Diabetes Brother    Asthma Brother    Heart disease Maternal Grandfather     Social History   Socioeconomic History   Marital status: Married    Spouse name: Not on file   Number of children: Not on file   Years of education: Not on file   Highest education level: Not on file  Occupational History   Not on file  Tobacco Use   Smoking status: Never    Passive exposure: Never   Smokeless tobacco: Never  Vaping Use   Vaping Use: Never used  Substance and Sexual Activity   Alcohol use: No   Drug use: No   Sexual activity: Not Currently    Partners: Male    Birth control/protection: Post-menopausal  Other Topics Concern   Not on file  Social History Narrative   Not on file   Social Determinants of Health   Financial Resource Strain: Low Risk  (04/25/2020)   Overall Financial Resource Strain (CARDIA)    Difficulty of Paying Living Expenses: Not hard at all  Food Insecurity: No Food Insecurity (04/25/2020)   Hunger Vital Sign    Worried About Running Out of Food in the Last Year: Never true    Ran Out of Food in the Last Year: Never true  Transportation Needs: No Transportation Needs (04/25/2020)   PRAPARE - Hydrologist (Medical): No    Lack of Transportation (Non-Medical): No  Physical Activity: Not on file  Stress: Not on file  Social Connections: Unknown (04/25/2020)   Social Connection and Isolation Panel [NHANES]    Frequency of Communication with Friends and Family: Not on file    Frequency of Social Gatherings with Friends and Family: Not on file    Attends Religious Services: Not on file    Active Member of Clubs or Organizations: Not on file    Attends Archivist Meetings: Not on  file    Marital Status: Married  Intimate Partner Violence: Not on file    Outpatient Medications Prior to Visit  Medication Sig Dispense Refill   albuterol (PROVENTIL HFA;VENTOLIN HFA) 108 (90 Base) MCG/ACT inhaler Inhale 1-2 puffs into the lungs every 4 (four) hours as needed for wheezing or shortness of breath. 18 g 2   anastrozole (ARIMIDEX) 1 MG tablet Take 1 tablet (1 mg total) by mouth daily. 90 tablet 4   atorvastatin (LIPITOR) 10 MG tablet Take 10 mg by mouth every other day.     B-D ULTRA-FINE 33 LANCETS MISC Check BG 2 times/day  Calcium-Magnesium-Vitamin D (CALCIUM MAGNESIUM PO) Take by mouth every other day.     Cholecalciferol 125 MCG (5000 UT) capsule Take 5,000 Units by mouth every other day.     empagliflozin (JARDIANCE) 25 MG TABS tablet Take 1 tablet (25 mg total) by mouth daily before breakfast. 90 tablet 3   fluticasone (FLONASE) 50 MCG/ACT nasal spray Place 1 spray into both nostrils daily as needed for allergies or rhinitis.     loratadine (CLARITIN) 10 MG tablet TAKE 1 TABLET(10 MG) BY MOUTH DAILY 90 tablet 3   metFORMIN (GLUCOPHAGE-XR) 500 MG 24 hr tablet Take 2 tablets (1,000 mg total) by mouth in the morning and at bedtime. 360 tablet 3   Multiple Vitamin (MULTIVITAMIN) LIQD Take 5 mLs by mouth daily.     Olopatadine HCl 0.6 % SOLN Place 2 sprays into both nostrils 2 (two) times daily.     omeprazole (PRILOSEC) 40 MG capsule Take 1 capsule (40 mg total) by mouth daily. (Patient taking differently: Take 40 mg by mouth as needed.) 30 capsule 2   ONETOUCH VERIO test strip CHECK BLOOD GLUCOSE DAILY 100 strip 3   Facility-Administered Medications Prior to Visit  Medication Dose Route Frequency Provider Last Rate Last Admin   sodium chloride flush (NS) 0.9 % injection 10 mL  10 mL Intracatheter PRN Magrinat, Virgie Dad, MD        Allergies  Allergen Reactions   Lisinopril Shortness Of Breath and Cough    wheezing    Review of Systems  Constitutional:  Negative  for fever.  HENT:  Negative for congestion.   Respiratory:  Negative for shortness of breath.   Cardiovascular:  Negative for chest pain and palpitations.  Gastrointestinal: Negative.   Genitourinary: Negative.   Musculoskeletal:  Positive for myalgias (Right Arm).  Neurological:  Negative for headaches.       Objective:    Physical Exam Constitutional:      General: She is not in acute distress.    Appearance: Normal appearance. She is not ill-appearing.  HENT:     Head: Normocephalic and atraumatic.     Right Ear: External ear normal.     Left Ear: External ear normal.     Nose: Nose normal.  Eyes:     Extraocular Movements: Extraocular movements intact.     Conjunctiva/sclera: Conjunctivae normal.     Pupils: Pupils are equal, round, and reactive to light.  Cardiovascular:     Rate and Rhythm: Normal rate and regular rhythm.     Heart sounds: Normal heart sounds. No murmur heard.    No gallop.  Pulmonary:     Effort: Pulmonary effort is normal. No respiratory distress.     Breath sounds: Normal breath sounds. No wheezing or rales.  Abdominal:     General: Bowel sounds are normal.     Tenderness: There is no abdominal tenderness.  Musculoskeletal:     Right forearm: Swelling present.     Right lower leg: No edema.     Left lower leg: No edema.     Comments: Swelling Inner elbow, proximal 2 by 2 below elbow  1 inch wide above elbow Swelling below dependent   Skin:    General: Skin is warm and dry.  Neurological:     Mental Status: She is alert and oriented to person, place, and time.  Psychiatric:        Judgment: Judgment normal.     BP 138/80 (BP Location: Right Arm, Patient Position: Sitting,  Cuff Size: Normal)   Pulse 90   Temp 98 F (36.7 C) (Oral)   Resp 16   Ht '5\' 3"'$  (1.6 m)   Wt 179 lb 3.2 oz (81.3 kg)   LMP 02/21/2014   SpO2 95%   BMI 31.74 kg/m  Wt Readings from Last 3 Encounters:  01/05/22 179 lb 3.2 oz (81.3 kg)  12/09/21 176 lb 3.2 oz  (79.9 kg)  10/30/21 178 lb (80.7 kg)    Diabetic Foot Exam - Simple   No data filed    Lab Results  Component Value Date   WBC 4.7 09/18/2021   HGB 11.9 (L) 09/18/2021   HCT 37.3 09/18/2021   PLT 222.0 09/18/2021   GLUCOSE 133 (H) 09/18/2021   CHOL 156 09/18/2021   TRIG 115.0 09/18/2021   HDL 59.50 09/18/2021   LDLDIRECT 101.0 10/17/2020   LDLCALC 73 09/18/2021   ALT 24 09/18/2021   AST 25 09/18/2021   NA 139 09/18/2021   K 4.5 09/18/2021   CL 102 09/18/2021   CREATININE 0.62 09/18/2021   BUN 11 09/18/2021   CO2 29 09/18/2021   TSH 2.48 09/18/2021   HGBA1C 7.3 (H) 09/18/2021   MICROALBUR <0.7 10/17/2020    Lab Results  Component Value Date   TSH 2.48 09/18/2021   Lab Results  Component Value Date   WBC 4.7 09/18/2021   HGB 11.9 (L) 09/18/2021   HCT 37.3 09/18/2021   MCV 91.2 09/18/2021   PLT 222.0 09/18/2021   Lab Results  Component Value Date   NA 139 09/18/2021   K 4.5 09/18/2021   CO2 29 09/18/2021   GLUCOSE 133 (H) 09/18/2021   BUN 11 09/18/2021   CREATININE 0.62 09/18/2021   BILITOT 0.3 09/18/2021   ALKPHOS 156 (H) 09/18/2021   ALKPHOS 177 (H) 09/18/2021   AST 25 09/18/2021   ALT 24 09/18/2021   PROT 6.7 09/18/2021   ALBUMIN 4.2 09/18/2021   CALCIUM 10.2 09/18/2021   CALCIUM 10.5 (H) 09/18/2021   ANIONGAP 6 04/14/2021   GFR 95.47 09/18/2021   Lab Results  Component Value Date   CHOL 156 09/18/2021   Lab Results  Component Value Date   HDL 59.50 09/18/2021   Lab Results  Component Value Date   LDLCALC 73 09/18/2021   Lab Results  Component Value Date   TRIG 115.0 09/18/2021   Lab Results  Component Value Date   CHOLHDL 3 09/18/2021   Lab Results  Component Value Date   HGBA1C 7.3 (H) 09/18/2021       Assessment & Plan:   Problem List Items Addressed This Visit     OBESITY    Encouraged DASH or MIND diet, decrease po intake and increase exercise as tolerated. Needs 7-8 hours of sleep nightly. Avoid trans fats, eat  small, frequent meals every 4-5 hours with lean proteins, complex carbs and healthy fats. Minimize simple carbs, high fat foods and processed foods       Essential hypertension    Well controlled, no changes to meds. Encouraged heart healthy diet such as the DASH diet and exercise as tolerated.        Relevant Orders   CBC   Comprehensive metabolic panel   TSH   Diabetes mellitus type 2 in obese ([place]) - Primary    hgba1c acceptable, minimize simple carbs. Increase exercise as tolerated. Continue current meds       Relevant Orders   Hemoglobin A1c   Elevated alkaline phosphatase level  Following with GI Dr Collene Mares now. Requesting records today.       Hyperlipidemia    Tolerating statin, encouraged heart healthy diet, avoid trans fats, minimize simple carbs and saturated fats. Increase exercise as tolerated       Relevant Orders   Lipid panel   Hypercalcemia    Check cmp and pth      Malignant neoplasm of upper-outer quadrant of right breast in female, estrogen receptor positive ([place])    Following with oncology and tolerating Arimidex      Lymphedema of right arm    Swelling and pain started in right arm last week. Was 10 of ten and is now 3 of 10 at rest and 5 of 10 with movement. Try massage, elevation, ice, heat, topical rubs report if worse. Is on side of breast cancer      RESOLVED: Type 2 diabetes mellitus without complication, without long-term current use of insulin ([facility])    hgba1c acceptable, minimize simple carbs. Increase exercise as tolerated. Continue current meds       Other Visit Diagnoses     Elevated PTHrP level       Relevant Orders   PTH, intact (no Ca)      No orders of the defined types were placed in this encounter.   I, Penni Homans, MD, personally preformed the services described in this documentation.  All medical record entries made by the scribe were at my direction and in my presence.  I have reviewed the chart and discharge instructions  (if applicable) and agree that the record reflects my personal performance and is accurate and complete. 01/05/2022   I,Amber Collins,acting as a scribe for Penni Homans, MD.,have documented all relevant documentation on the behalf of Penni Homans, MD,as directed by  Penni Homans, MD while in the presence of Penni Homans, MD.    Penni Homans, MD

## 2022-01-05 NOTE — Assessment & Plan Note (Signed)
Check cmp and pth

## 2022-01-06 LAB — PARATHYROID HORMONE, INTACT (NO CA): PTH: 75 pg/mL (ref 16–77)

## 2022-01-12 ENCOUNTER — Encounter (HOSPITAL_BASED_OUTPATIENT_CLINIC_OR_DEPARTMENT_OTHER): Payer: Self-pay | Admitting: Obstetrics & Gynecology

## 2022-01-12 ENCOUNTER — Ambulatory Visit (INDEPENDENT_AMBULATORY_CARE_PROVIDER_SITE_OTHER): Payer: 59 | Admitting: Obstetrics & Gynecology

## 2022-01-12 VITALS — BP 134/82 | HR 77 | Ht 63.0 in | Wt 176.0 lb

## 2022-01-12 DIAGNOSIS — E669 Obesity, unspecified: Secondary | ICD-10-CM

## 2022-01-12 DIAGNOSIS — C50411 Malignant neoplasm of upper-outer quadrant of right female breast: Secondary | ICD-10-CM

## 2022-01-12 DIAGNOSIS — Z86018 Personal history of other benign neoplasm: Secondary | ICD-10-CM

## 2022-01-12 DIAGNOSIS — IMO0001 Reserved for inherently not codable concepts without codable children: Secondary | ICD-10-CM

## 2022-01-12 DIAGNOSIS — Z531 Procedure and treatment not carried out because of patient's decision for reasons of belief and group pressure: Secondary | ICD-10-CM

## 2022-01-12 DIAGNOSIS — E1169 Type 2 diabetes mellitus with other specified complication: Secondary | ICD-10-CM | POA: Diagnosis not present

## 2022-01-12 DIAGNOSIS — L292 Pruritus vulvae: Secondary | ICD-10-CM

## 2022-01-12 DIAGNOSIS — Z01419 Encounter for gynecological examination (general) (routine) without abnormal findings: Secondary | ICD-10-CM | POA: Diagnosis not present

## 2022-01-12 DIAGNOSIS — Z170 Estrogen receptor positive status [ER+]: Secondary | ICD-10-CM

## 2022-01-12 DIAGNOSIS — I890 Lymphedema, not elsewhere classified: Secondary | ICD-10-CM

## 2022-01-12 MED ORDER — TRIAMCINOLONE ACETONIDE 0.5 % EX OINT: 1.0000 | TOPICAL_OINTMENT | Freq: Two times a day (BID) | CUTANEOUS | 0 refills | Status: DC

## 2022-01-12 NOTE — Progress Notes (Signed)
61 y.o. G0P0 Married Other or two or more races female here for annual exam.  Having some issues with elevated alk phos.  Had abdominal ultrasound showing fatty liver disease.  Bx is planned in early December.    Breast cancer followed by Dr. Chryl Heck.  On anastrozole and will be for 5 years total.  Having lymphedema issues in right arm.  [place] clinic discussed.  Pt states was mentioned by two previous providers but referrals not made.    Denies vaginal bleeding.  Having some external itching she wants me to assess today.  The itching has been going on about two weeks.  Lab work just done.  Hba1c was 6.9.    Patient's last menstrual period was 02/21/2014.          Sexually active: No.  The current method of family planning is post menopausal status.    Smoker:  no  Health Maintenance: Pap:  01/02/2021 Negative History of abnormal Pap:  no MMG:  09/15/2021 Colonoscopy:  11/15/2019 BMD:   09/15/2021, -1.1 Screening Labs: done with Dr. Randel Pigg   reports that she has never smoked. She has never been exposed to tobacco smoke. She has never used smokeless tobacco. She reports that she does not drink alcohol and does not use drugs.  Past Medical History:  Diagnosis Date   Anemia May 2022   Chemotherapy   Asthma    with respiratory infection   Cancer [facility])    breast cancer - right   Cataract March 2018   Optometrist visit   Diabetes mellitus without complication ([place])    type II   Fatty liver    Fibroid uterus    GERD (gastroesophageal reflux disease) June 2021   Hypertension    Patient reports that she has never been diagnosied with HTN, "I take HCTZ for swelling in my feet, if needed."   Osteoarthritis 03/10/2015   PONV (postoperative nausea and vomiting)    2014   Refusal of blood transfusions as patient is Jehovah's Witness    NO BLOOD PRODUCTS   Sleep apnea     Past Surgical History:  Procedure Laterality Date   BREAST CYST ASPIRATION     BREAST EXCISIONAL BIOPSY      BREAST LUMPECTOMY WITH SENTINEL LYMPH NODE BIOPSY Right 05/21/2020   Procedure: RIGHT BREAST LUMPECTOMY WITH SENTINEL LYMPH NODE BIOPSY;  Surgeon: Stark Klein, MD;  Location: [place];  Service: General;  Laterality: Right;   CHOLECYSTECTOMY  02/2009   COLONOSCOPY     DILATATION & CURRETTAGE/HYSTEROSCOPY WITH RESECTOCOPE N/A 03/13/2014   Procedure: DILATATION & CURETTAGE/HYSTEROSCOPY ;  Surgeon: Lyman Speller, MD;  Location: [facility];  Service: Gynecology;  Laterality: N/A;   PORTACATH PLACEMENT Left 05/21/2020   Procedure: INSERTION PORT-A-CATH;  Surgeon: Stark Klein, MD;  Location: [place];  Service: General;  Laterality: Left;   RADIOACTIVE SEED GUIDED EXCISIONAL BREAST BIOPSY Left 12/30/2016   Procedure: RADIOACTIVE SEED GUIDED EXCISIONAL BREAST BIOPSY;  Surgeon: Stark Klein, MD;  Location: [place];  Service: General;  Laterality: Left;    Current Outpatient Medications  Medication Sig Dispense Refill   albuterol (PROVENTIL HFA;VENTOLIN HFA) 108 (90 Base) MCG/ACT inhaler Inhale 1-2 puffs into the lungs every 4 (four) hours as needed for wheezing or shortness of breath. 18 g 2   anastrozole (ARIMIDEX) 1 MG tablet Take 1 tablet (1 mg total) by mouth daily. 90 tablet 4   atorvastatin (LIPITOR) 10 MG tablet Take 10 mg by mouth every other day.  B-D ULTRA-FINE 33 LANCETS MISC Check BG 2 times/day     Calcium-Magnesium-Vitamin D (CALCIUM MAGNESIUM PO) Take by mouth every other day.     Cholecalciferol 125 MCG (5000 UT) capsule Take 5,000 Units by mouth every other day.     empagliflozin (JARDIANCE) 25 MG TABS tablet Take 1 tablet (25 mg total) by mouth daily before breakfast. 90 tablet 3   fluticasone (FLONASE) 50 MCG/ACT nasal spray Place 1 spray into both nostrils daily as needed for allergies or rhinitis.     loratadine (CLARITIN) 10 MG tablet TAKE 1 TABLET(10 MG) BY MOUTH DAILY 90 tablet 3   metFORMIN (GLUCOPHAGE-XR) 500 MG 24 hr tablet Take 2 tablets (1,000 mg  total) by mouth in the morning and at bedtime. 360 tablet 3   Multiple Vitamin (MULTIVITAMIN) LIQD Take 5 mLs by mouth daily.     Olopatadine HCl 0.6 % SOLN Place 2 sprays into both nostrils 2 (two) times daily.     omeprazole (PRILOSEC) 40 MG capsule Take 1 capsule (40 mg total) by mouth daily. (Patient taking differently: Take 40 mg by mouth as needed.) 30 capsule 2   ONETOUCH VERIO test strip CHECK BLOOD GLUCOSE DAILY 100 strip 3   No current facility-administered medications for this visit.   Facility-Administered Medications Ordered in Other Visits  Medication Dose Route Frequency Provider Last Rate Last Admin   sodium chloride flush (NS) 0.9 % injection 10 mL  10 mL Intracatheter PRN Magrinat, Virgie Dad, MD        Family History  Problem Relation Age of Onset   Asthma Mother    Diabetes Mother    Diabetes Father    Heart attack Father    Heart disease Father    Heart Problems Sister    Asthma Sister    Hypertension Sister    Diabetes Brother    Diabetes Brother    Asthma Brother    Heart disease Maternal Grandfather     ROS: Constitutional: negative Genitourinary:negative  Exam:   BP 134/82 (BP Location: Left Arm, Patient Position: Sitting, Cuff Size: Large)   Pulse 77   Ht _0  (1.6 m) Comment: Reported  Wt 176 lb (79.8 kg)   LMP 02/21/2014   BMI 31.18 kg/m   Height: _1  (160 cm) (Reported)  General appearance: alert, cooperative and appears stated age Head: Normocephalic, without obvious abnormality, atraumatic Neck: no adenopathy, supple, symmetrical, trachea midline and thyroid normal to inspection and palpation Lungs: clear to auscultation bilaterally Breasts:  well healed scarring both breasts, no new masses, nipple discharge or LAD Heart: regular rate and rhythm Abdomen: soft, non-tender; bowel sounds normal; no masses,  no organomegaly Extremities: extremities normal, atraumatic, no cyanosis or edema Skin: Skin color, texture, turgor normal. No rashes  or lesions Lymph nodes: Cervical, supraclavicular, and axillary nodes normal. No abnormal inguinal nodes palpated Neurologic: Grossly normal   Pelvic: External genitalia:  no lesions              Urethra:  normal appearing urethra with no masses, tenderness or lesions              Bartholins and Skenes: normal                 Vagina: normal appearing vagina with normal color and no discharge, no lesions              Cervix: no lesions              Pap  taken: No. Bimanual Exam:  Uterus:  normal size, contour, position, consistency, mobility, non-tender              Adnexa: normal adnexa and no mass, fullness, tenderness               Rectovaginal: Confirms               Anus:  normal sphincter tone, no lesions  Chaperone, Octaviano Batty, CMA, was present for exam.  Assessment/Plan: 1. Well woman exam with routine gynecological exam - Pap smear 2022 - Mammogram 08/2021 - Colonoscopy 10/2019 - Bone mineral density done this year - lab work done with PCP, Dr. Randel Pigg - vaccines reviewed/updated  2. History of uterine fibroid  3. Malignant neoplasm of upper-outer quadrant of right breast in female, estrogen receptor positive ([place]) - followed by Dr. Chryl Heck and Dr. Barry Dienes - referral to lymphedema clinic  4. Diabetes mellitus type 2 in obese (H[facility]) - las4t hbA1C was 6.9  5. Patient is Jehovah's Witness  6.  Vulvar itching - normal exam today.  Rx

## 2022-01-12 NOTE — Patient Instructions (Signed)
Address: [address], [place], [place] [address] Phone: [phone]

## 2022-01-14 ENCOUNTER — Other Ambulatory Visit: Payer: Self-pay | Admitting: Internal Medicine

## 2022-01-19 ENCOUNTER — Encounter: Payer: Self-pay | Admitting: Family Medicine

## 2022-01-23 ENCOUNTER — Ambulatory Visit (HOSPITAL_COMMUNITY): Admission: RE | Admit: 2022-01-23 | Payer: 59 | Source: Ambulatory Visit

## 2022-02-05 ENCOUNTER — Other Ambulatory Visit (HOSPITAL_COMMUNITY): Payer: 59

## 2022-02-11 ENCOUNTER — Other Ambulatory Visit: Payer: Self-pay | Admitting: Radiology

## 2022-02-11 ENCOUNTER — Other Ambulatory Visit: Payer: Self-pay | Admitting: *Deleted

## 2022-02-11 ENCOUNTER — Other Ambulatory Visit: Payer: Self-pay | Admitting: Internal Medicine

## 2022-02-11 DIAGNOSIS — R7989 Other specified abnormal findings of blood chemistry: Secondary | ICD-10-CM

## 2022-02-11 MED ORDER — ANASTROZOLE 1 MG PO TABS: 1.0000 mg | ORAL_TABLET | Freq: Every day | ORAL | 4 refills | Status: DC

## 2022-02-12 ENCOUNTER — Other Ambulatory Visit: Payer: Self-pay

## 2022-02-12 ENCOUNTER — Encounter (HOSPITAL_COMMUNITY): Payer: Self-pay

## 2022-02-12 ENCOUNTER — Ambulatory Visit (HOSPITAL_COMMUNITY)
Admission: RE | Admit: 2022-02-12 | Discharge: 2022-02-12 | Disposition: A | Discharge status: Home / Self Care | Payer: 59 | Source: Ambulatory Visit | Attending: Gastroenterology | Admitting: Gastroenterology

## 2022-02-12 DIAGNOSIS — R7989 Other specified abnormal findings of blood chemistry: Secondary | ICD-10-CM | POA: Diagnosis present

## 2022-02-12 DIAGNOSIS — K760 Fatty (change of) liver, not elsewhere classified: Secondary | ICD-10-CM | POA: Insufficient documentation

## 2022-02-12 DIAGNOSIS — R748 Abnormal levels of other serum enzymes: Secondary | ICD-10-CM

## 2022-02-12 LAB — CBC
HCT: 42.3 % (ref 36.0–46.0)
Hemoglobin: 13.3 g/dL (ref 12.0–15.0)
MCH: 29.4 pg (ref 26.0–34.0)
MCHC: 31.4 g/dL (ref 30.0–36.0)
MCV: 93.4 fL (ref 80.0–100.0)
Platelets: 284 10*3/uL (ref 150–400)
RBC: 4.53 MIL/uL (ref 3.87–5.11)
RDW: 13.6 % (ref 11.5–15.5)
WBC: 6.2 10*3/uL (ref 4.0–10.5)
nRBC: 0 % (ref 0.0–0.2)

## 2022-02-12 LAB — GLUCOSE, CAPILLARY
Glucose-Capillary: 128 mg/dL — ABNORMAL HIGH (ref 70–99)
Glucose-Capillary: 99 mg/dL (ref 70–99)

## 2022-02-12 LAB — PROTIME-INR
INR: 0.9 (ref 0.8–1.2)
Prothrombin Time: 12.4 seconds (ref 11.4–15.2)

## 2022-02-12 MED ORDER — MIDAZOLAM HCL 2 MG/2ML IJ SOLN
INTRAMUSCULAR | Status: AC
  Filled 2022-02-12: qty 2

## 2022-02-12 MED ORDER — MIDAZOLAM HCL 2 MG/2ML IJ SOLN
INTRAMUSCULAR | Status: AC | PRN
  Administered 2022-02-12: 1 mg via INTRAVENOUS

## 2022-02-12 MED ORDER — SODIUM CHLORIDE 0.9 % IV SOLN: INTRAVENOUS | Status: DC

## 2022-02-12 MED ORDER — GELATIN ABSORBABLE 12-7 MM EX MISC
CUTANEOUS | Status: AC
  Filled 2022-02-12: qty 1

## 2022-02-12 MED ORDER — FENTANYL CITRATE (PF) 100 MCG/2ML IJ SOLN
INTRAMUSCULAR | Status: AC | PRN
  Administered 2022-02-12: 25 ug via INTRAVENOUS

## 2022-02-12 MED ORDER — LIDOCAINE HCL (PF) 1 % IJ SOLN
INTRAMUSCULAR | Status: AC
  Filled 2022-02-12: qty 30

## 2022-02-12 MED ORDER — LIDOCAINE HCL (PF) 1 % IJ SOLN: 8.0000 mL | Freq: Once | INTRAMUSCULAR | Status: DC

## 2022-02-12 MED ORDER — FENTANYL CITRATE (PF) 100 MCG/2ML IJ SOLN
INTRAMUSCULAR | Status: AC
  Filled 2022-02-12: qty 2

## 2022-02-12 NOTE — Progress Notes (Signed)
Report given nurse Cardell Peach, RN

## 2022-02-12 NOTE — Procedures (Signed)
Interventional Radiology Procedure Note  Procedure: US guided random liver biopsy  Complications: None  Estimated Blood Loss: None  Recommendations: - bedrest x 2 hrs - dc home   Signed,  Criselda Peaches, MD

## 2022-02-12 NOTE — H&P (Signed)
Chief Complaint: Patient was seen in consultation today for elevated liver functions--- liver core biopsy at the request of Mann,Jyothi  Referring Physician(s): Mann,Jyothi  Supervising Physician: Jacqulynn Cadet  Patient Status: [facility] - Out-pt  History of Present Illness: Yolanda [name] is a 61 y.o. female   Pt has known of elevated liver functions for approx 2 years PCP referred too Dr Collene Mares for evaluation and management recently Fatty liver and high functions Request made for liver core biopsy  Past Medical History:  Diagnosis Date   Anemia May 2022   Chemotherapy   Asthma    with respiratory infection   Cancer [facility])    breast cancer - right   Cataract March 2018   Optometrist visit   Diabetes mellitus without complication ([place])    type II   Fatty liver    Fibroid uterus    GERD (gastroesophageal reflux disease) June 2021   Hypertension    Patient reports that she has never been diagnosied with HTN, "I take HCTZ for swelling in my feet, if needed."   Osteoarthritis 03/10/2015   PONV (postoperative nausea and vomiting)    2014   Refusal of blood transfusions as patient is Jehovah's Witness    NO BLOOD PRODUCTS   Sleep apnea     Past Surgical History:  Procedure Laterality Date   BREAST CYST ASPIRATION     BREAST EXCISIONAL BIOPSY     BREAST LUMPECTOMY WITH SENTINEL LYMPH NODE BIOPSY Right 05/21/2020   Procedure: RIGHT BREAST LUMPECTOMY WITH SENTINEL LYMPH NODE BIOPSY;  Surgeon: Stark Klein, MD;  Location: [place];  Service: General;  Laterality: Right;   CHOLECYSTECTOMY  02/2009   COLONOSCOPY     DILATATION & CURRETTAGE/HYSTEROSCOPY WITH RESECTOCOPE N/A 03/13/2014   Procedure: DILATATION & CURETTAGE/HYSTEROSCOPY ;  Surgeon: Lyman Speller, MD;  Location: [facility];  Service: Gynecology;  Laterality: N/A;   PORTA CATH REMOVAL  01/2021   PORTACATH PLACEMENT Left 05/21/2020   Procedure: INSERTION PORT-A-CATH;  Surgeon: Stark Klein, MD;  Location: [place];  Service: General;  Laterality: Left;   RADIOACTIVE SEED GUIDED EXCISIONAL BREAST BIOPSY Left 12/30/2016   Procedure: RADIOACTIVE SEED GUIDED EXCISIONAL BREAST BIOPSY;  Surgeon: Stark Klein, MD;  Location: [place];  Service: General;  Laterality: Left;    Allergies: Lisinopril  Medications: Prior to Admission medications   Medication Sig Start Date End Date Taking? Authorizing Provider  acetaminophen (TYLENOL) 500 MG tablet Take 500 mg by mouth every 6 (six) hours as needed for moderate pain.   Yes [provider]  anastrozole (ARIMIDEX) 1 MG tablet Take 1 tablet (1 mg total) by mouth daily. 02/11/22  Yes Benay Pike, MD  CALCIUM-MAGNESIUM PO Take 30 mLs by mouth once a week.   Yes [provider]  Cholecalciferol 125 MCG (5000 UT) capsule Take 5,000 Units by mouth daily.   Yes [provider]  glucose blood (ONETOUCH VERIO) test strip CHECK BLOOD BLUCOSE DAILY 02/11/22  Yes Shamleffer, Melanie Crazier, MD  Menaquinone-7 (VITAMIN K2) 100 MCG CAPS Take 100 mcg by mouth daily.   Yes [provider]  metFORMIN (GLUCOPHAGE-XR) 500 MG 24 hr tablet TAKE 2 TABLETS(1000 MG) BY MOUTH IN THE MORNING AND AT BEDTIME 01/14/22  Yes Shamleffer, Melanie Crazier, MD  Multiple Vitamin (MULTIVITAMIN) LIQD Take 30 mLs by mouth every other day.   Yes [provider]  Olopatadine HCl 0.6 % SOLN Place 2 sprays into both nostrils 2 (two) times daily as needed (allergies).  11/05/21  Yes [provider]  albuterol (PROVENTIL HFA;VENTOLIN HFA) 108 (90 Base) MCG/ACT inhaler Inhale 1-2 puffs into the lungs every 4 (four) hours as needed for wheezing or shortness of breath. 07/23/17   Mosie Lukes, MD  empagliflozin (JARDIANCE) 25 MG TABS tablet Take 1 tablet (25 mg total) by mouth daily before breakfast. Patient not taking: Reported on 01/20/2022 05/09/21   Shamleffer, Melanie Crazier, MD  loratadine (CLARITIN) 10 MG tablet TAKE 1 TABLET(10  MG) BY MOUTH DAILY Patient not taking: Reported on 01/20/2022 06/01/21   Gardenia Phlegm, NP  omeprazole (PRILOSEC) 40 MG capsule Take 1 capsule (40 mg total) by mouth daily. Patient not taking: Reported on 01/20/2022 07/30/21   Benay Pike, MD  Polyethylene Glycol 400 (BLINK TEARS OP) Place 1 drop into both eyes daily as needed (dry eyes).    [provider]  triamcinolone ointment (KENALOG) 0.5 % Apply 1 Application topically 2 (two) times daily. Patient not taking: Reported on 01/20/2022 01/12/22   Megan Salon, MD     Family History  Problem Relation Age of Onset   Asthma Mother    Diabetes Mother    Diabetes Father    Heart attack Father    Heart disease Father    Heart Problems Sister    Asthma Sister    Hypertension Sister    Diabetes Brother    Diabetes Brother    Asthma Brother    Heart disease Maternal Grandfather     Social History   Socioeconomic History   Marital status: Married    Spouse name: Not on file   Number of children: Not on file   Years of education: Not on file   Highest education level: Not on file  Occupational History   Not on file  Tobacco Use   Smoking status: Never    Passive exposure: Never   Smokeless tobacco: Never  Vaping Use   Vaping Use: Never used  Substance and Sexual Activity   Alcohol use: No   Drug use: No   Sexual activity: Not Currently    Partners: Male    Birth control/protection: Post-menopausal  Other Topics Concern   Not on file  Social History Narrative   Not on file   Social Determinants of Health   Financial Resource Strain: Low Risk  (04/25/2020)   Overall Financial Resource Strain (CARDIA)    Difficulty of Paying Living Expenses: Not hard at all  Food Insecurity: No Food Insecurity (04/25/2020)   Hunger Vital Sign    Worried About Running Out of Food in the Last Year: Never true    [place] in the Last Year: Never true  Transportation Needs: No Transportation Needs (04/25/2020)    PRAPARE - Hydrologist (Medical): No    Lack of Transportation (Non-Medical): No  Physical Activity: Not on file  Stress: Not on file  Social Connections: Unknown (04/25/2020)   Social Connection and Isolation Panel [NHANES]    Frequency of Communication with Friends and Family: Not on file    Frequency of Social Gatherings with Friends and Family: Not on file    Attends Religious Services: Not on file    Active Member of Clubs or Organizations: Not on file    Attends Archivist Meetings: Not on file    Marital Status: Married    Review of Systems: A 12 point ROS discussed and pertinent positives are indicated in the HPI above.  All  other systems are negative.  Review of Systems  Constitutional:  Negative for fatigue and fever.  Respiratory:  Negative for cough and shortness of breath.   Cardiovascular:  Negative for chest pain.  Gastrointestinal:  Negative for abdominal pain, diarrhea, nausea and vomiting.  Psychiatric/Behavioral:  Negative for behavioral problems and confusion.     Vital Signs: BP (!) 141/85   Pulse 91   Temp (!) 97.5 F (36.4 C) (Temporal)   Resp 15   Ht '5\' 3"'$  (1.6 m)   Wt 180 lb (81.6 kg)   LMP 02/21/2014   SpO2 96%   BMI 31.89 kg/m     Physical Exam Vitals reviewed.  HENT:     Mouth/Throat:     Mouth: Mucous membranes are moist.  Cardiovascular:     Rate and Rhythm: Normal rate and regular rhythm.     Heart sounds: Normal heart sounds.  Pulmonary:     Effort: Pulmonary effort is normal.     Breath sounds: Normal breath sounds.  Abdominal:     Palpations: Abdomen is soft.  Musculoskeletal:        General: Normal range of motion.  Skin:    General: Skin is warm.  Neurological:     Mental Status: She is alert and oriented to person, place, and time.  Psychiatric:        Behavior: Behavior normal.     Imaging: No results found.  Labs:  CBC: Recent Labs    09/01/21 1450 09/18/21 0730  01/05/22 0926 02/12/22 1115  WBC 6.1 4.7 6.4 6.2  HGB 11.8* 11.9* 12.7 13.3  HCT 36.5 37.3 38.7 42.3  PLT 242.0 222.0 270.0 284    COAGS: No results for input(s): "INR", "APTT" in the last 8760 hours.  BMP: Recent Labs    04/14/21 0816 05/12/21 1141 09/01/21 1450 09/18/21 0730 01/05/22 0926  NA 140 140 140 139 139  K 4.3 4.7 4.9 4.5 4.4  CL 106 103 101 102 102  CO2 '28 30 31 29 28  '$ GLUCOSE 116* 92 78 133* 100*  BUN '17 15 11 11 13  '$ CALCIUM 10.3 10.7* 10.6* 10.5*  10.2 10.4  CREATININE 0.71 0.66 0.81 0.62 0.62  GFRNONAA >60  --   --   --   --     LIVER FUNCTION TESTS: Recent Labs    05/12/21 1141 09/01/21 1450 09/18/21 0730 01/05/22 0926  BILITOT 0.3 0.3 0.3 0.3  AST '24 24 25 20  '$ ALT '26 24 24 20  '$ ALKPHOS 146* 148* 177*  156* 141*  PROT 6.9 6.5 6.7 6.7  ALBUMIN 4.4 4.2 4.2 4.2    TUMOR MARKERS: No results for input(s): "AFPTM", "CEA", "CA199", "CHROMGRNA" in the last 8760 hours.  Assessment and Plan:  Fatty liver; elevated liver function Scheduled for random liver biopsy per GI Risks and benefits of random liver biopsy was discussed with the patient and/or patient's family including, but not limited to bleeding, infection, damage to adjacent structures or low yield requiring additional tests.  All of the questions were answered and there is agreement to proceed.  Consent signed and in chart.   Thank you for this interesting consult.  I greatly enjoyed meeting GRAY MAUGERI and look forward to participating in their care.  A copy of this report was sent to the requesting provider on this date.  Electronically Signed: Lavonia Drafts, PA-C 02/12/2022, 12:17 PM   I spent a total of  30 Minutes   in face to face  in clinical consultation, greater than 50% of which was counseling/coordinating care for liver core biopsy

## 2022-02-13 LAB — SURGICAL PATHOLOGY

## 2022-02-19 ENCOUNTER — Ambulatory Visit: Payer: 59

## 2022-02-19 ENCOUNTER — Encounter: Payer: Self-pay | Admitting: Family Medicine

## 2022-02-22 NOTE — Therapy (Signed)
OUTPATIENT PHYSICAL THERAPY ONCOLOGY EVALUATION  Patient Name: Yolanda [name] MRN: [id number] DOB:12-14-1960, 62 y.o., female Today's Date: 02/23/2022  END OF SESSION:  PT End of Session - 02/23/22 1158     Visit Number 1    Number of Visits 8    Date for PT Re-Evaluation 03/23/22    PT Start Time 9379    PT Stop Time 0240    PT Time Calculation (min) 59 min    Activity Tolerance Patient tolerated treatment well    Behavior During Therapy [facility] for tasks assessed/performed             Past Medical History:  Diagnosis Date   Anemia May 2022   Chemotherapy   Asthma    with respiratory infection   Cancer [facility])    breast cancer - right   Cataract March 2018   Optometrist visit   Diabetes mellitus without complication ([place])    type II   Fatty liver    Fibroid uterus    GERD (gastroesophageal reflux disease) June 2021   Hypertension    Patient reports that she has never been diagnosied with HTN, "I take HCTZ for swelling in my feet, if needed."   Osteoarthritis 03/10/2015   PONV (postoperative nausea and vomiting)    2014   Refusal of blood transfusions as patient is Jehovah's Witness    NO BLOOD PRODUCTS   Sleep apnea    Past Surgical History:  Procedure Laterality Date   BREAST CYST ASPIRATION     BREAST EXCISIONAL BIOPSY     BREAST LUMPECTOMY WITH SENTINEL LYMPH NODE BIOPSY Right 05/21/2020   Procedure: RIGHT BREAST LUMPECTOMY WITH SENTINEL LYMPH NODE BIOPSY;  Surgeon: Stark Klein, MD;  Location: [place];  Service: General;  Laterality: Right;   CHOLECYSTECTOMY  02/2009   COLONOSCOPY     DILATATION & CURRETTAGE/HYSTEROSCOPY WITH RESECTOCOPE N/A 03/13/2014   Procedure: DILATATION & CURETTAGE/HYSTEROSCOPY ;  Surgeon: Lyman Speller, MD;  Location: [place] [facility];  Service: Gynecology;  Laterality: N/A;   PORTA CATH REMOVAL  01/2021   PORTACATH PLACEMENT Left 05/21/2020   Procedure: INSERTION PORT-A-CATH;  Surgeon: Stark Klein, MD;  Location: [place];  Service:  General;  Laterality: Left;   RADIOACTIVE SEED GUIDED EXCISIONAL BREAST BIOPSY Left 12/30/2016   Procedure: RADIOACTIVE SEED GUIDED EXCISIONAL BREAST BIOPSY;  Surgeon: Stark Klein, MD;  Location: [place];  Service: General;  Laterality: Left;   Patient Active Problem List   Diagnosis Date Noted   Lymphedema of right arm 01/05/2022   Right ankle pain 05/12/2021   Bilateral hand pain 05/12/2021   Preventative health care 07/16/2020   Burning sensation of eye 07/16/2020   Port-A-Cath in place 06/18/2020   Malignant neoplasm of upper-outer quadrant of right breast in female, estrogen receptor positive ([place]) 04/18/2020   Hypercalcemia 04/10/2020   Diverticulosis of sigmoid colon 11/15/2019   Paresthesia and pain of right extremity 02/05/2019   Fatty liver 01/31/2018   Atypical lobular hyperplasia [facility]) of left breast 02/01/2017   Fibroadenoma 01/22/2017   Hyperlipidemia 06/12/2015   Diabetes mellitus type 2 in obese ([place]) 02/23/2014   Elevated alkaline phosphatase level 02/23/2014   Uterine fibroid 02/23/2014   OBESITY 11/26/2009   OBSTRUCTIVE SLEEP APNEA 06/01/2008   Essential hypertension 06/01/2008    PCP: Penni Homans, MD  REFERRING PROVIDER: Hale Bogus, MD  REFERRING DIAG: Right Breast Cancer/Lymphedema  THERAPY DIAG:  Lymphedema of breast  Abnormal posture  ONSET DATE: 12/30/2021  Rationale for Evaluation and  Treatment: Rehabilitation  SUBJECTIVE:                                                                                                                                                                                           SUBJECTIVE STATEMENT: She has swelling in her right arm and right breast. She developed swelling in the right arm in November of 23, but does not notice much swelling now. Her breast has always hurt and been swollen since her lumpectomy. She had atleast 5 aspirations of a hematoma in her breast. She had difficulty with  wound healing due to the hematoma.   She is having some pain and tightness from her elbow to her axilla and into the breast and limitations in right shoulder ROM.  It is the pain that made her concerned she has swelling. She does not notice any heaviness in the breast.   PERTINENT HISTORY:  Pt had a right lumpectomy with SLNB on 05/21/2020 .There were 7 LN's removed and none were malignant. She had a hematoma requiring multiple aspirations. 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 She had chemotherapy ending on 11/13/2020 and radiation ending 03/27/2021. Several providers had noted breast changes .   PAIN:  Are you having pain? Yes NPRS scale: 3/10 Pain location: right arm, axilla and lateral breast Pain orientation: Right  PAIN TYPE: aching and tight Pain description: intermittent  Aggravating factors: in bed, reaching, Relieving factors: not sure  PRECAUTIONS: Other: Right UE/breast lymphedema  WEIGHT BEARING RESTRICTIONS: No  FALLS:  Has patient fallen in last 6 months? No  LIVING ENVIRONMENT: Lives with: lives with their spouse Lives in: House/apartment Stairs: Yes; Internal: 17-20 steps; on left going up and External: 3 steps; none Has following equipment at home: None  OCCUPATION: retired,  LEISURE: sit outside, read  HAND DOMINANCE: right   PRIOR LEVEL OF FUNCTION: Independent  PATIENT GOALS: Learn how to treat herself at home and make it better   OBJECTIVE:  COGNITION: Overall cognitive status: Impaired Per pt report  PALPATION: Palpable cord right axillary region that runs into right lateral breast  OBSERVATIONS / OTHER ASSESSMENTS: Right breast with noted skin thickening from radiation, but no enlarged pores. Indentation noted at breast incision. Shortening of breast tissue noted from radiation. Right arm slightly larger, but no pitting edema and hard to tell if just hand dominance  SENSATION: Light touch: Appears intact POSTURE: forward head, rounded shoulders  UPPER  EXTREMITY AROM/PROM:  A/PROM RIGHT   eval   Shoulder extension 55  Shoulder flexion 127  Shoulder abduction 131  Shoulder internal rotation 53  Shoulder external rotation 103    (  Blank rows = not tested)  A/PROM LEFT   eval  Shoulder extension 50  Shoulder flexion 150  Shoulder abduction 163  Shoulder internal rotation 62  Shoulder external rotation 96    (Blank rows = not tested)  CERVICAL AROM: All within functional limits:    UPPER EXTREMITY STRENGTH: WFL   LYMPHEDEMA ASSESSMENTS:   SURGERY TYPE/DATE:Right lumpectomy with SLNB  NUMBER OF LYMPH NODES REMOVED: 0/7  CHEMOTHERAPY: yes, ended 11/13/2020  RADIATION:Yes, ended 03/27/2021  HORMONE TREATMENT: Yes, anastrozole  INFECTIONS: NO  LYMPHEDEMA ASSESSMENTS:   LANDMARK RIGHT  eval  10 cm proximal to olecranon process 27.7  Olecranon process 24.5  10 cm proximal to ulnar styloid process 21.0  Just proximal to ulnar styloid process 14.9  Across hand at thumb web space 18.0  At base of 2nd digit 5.4  (Blank rows = not tested)  LANDMARK LEFT  eval  10 cm proximal to olecranon process 27.0  Olecranon process 23.6  10 cm proximal to ulnar styloid process 19.3  Just proximal to ulnar styloid process 14.35  Across hand at thumb web space 17.1  At base of 2nd digit 5.3  (Blank rows = not tested)     LLIS  18%  QUICK DASH SURVEY: NA   TODAY'S TREATMENT:                                                                                                                                         DATE: 02/23/2022  PATIENT EDUCATION:  Education details: 4 post op exercises, cording, Performed exercises x 4-5 reps ea Person educated: Patient Education method: Explanation and Handouts Education comprehension: verbalized understanding and returned demonstration  HOME EXERCISE PROGRAM: 4 post op exercises.  ASSESSMENT:  CLINICAL IMPRESSION: Patient is a 62 y.o. female who was seen today for physical therapy  evaluation and treatment for complaints of right breast ,UE ,swelling/lymphedema s/p Right lumpectomy with SLNB (0/7 LN's) chemotherapy and radiation with complaints of pain. Pt noted significant increase in swelling in the right arm in 11/23 which has now reduced, and there is still some noted difference in circumference between both UE's in the forearm that could be hand dominance. She has a compression sleeve and will bring it with her next visit so we can check. She would benefit from a gauntlet for flying. She also has noted cording extending from the axilla to the breast and the axilla to the mid upper arm which felt much better after performing stretches.  Right shoulder ROM is limited due to cording/tightness.She will benefit from skilled PT to address deficits and return to PLOF  OBJECTIVE IMPAIRMENTS: decreased knowledge of condition, decreased ROM, increased fascial restrictions, impaired UE functional use, postural dysfunction, and pain.   ACTIVITY LIMITATIONS: sleeping, bed mobility, reach over head, and hygiene/grooming  PARTICIPATION LIMITATIONS: cleaning, laundry, and shopping  PERSONAL FACTORS: 1-2 comorbidities: Breast Cancer with 0/7 LN's, radiation  are also affecting patient's functional outcome.   REHAB POTENTIAL: Good  CLINICAL DECISION MAKING: Stable/uncomplicated  EVALUATION COMPLEXITY: Low  GOALS: Goals reviewed with patient? Yes  SHORT TERM GOALS =LONG TERM GOALS: Target date: 03/23/2021  Pt will be compliant  with use of compression bra/sleeve prn to decrease edema Baseline: Goal status: INITIAL  2.  Pt will note decreased UE/breast pain by greater than 50% Baseline:  Goal status: INITIAL  3.  Pt will be independent with MLD to reduce swelling and demonstrate independence in self care Baseline:  Goal status: INITIAL  4.  Pt will restore right shoulder ROM to WNL Baseline:  Goal status: INITIAL   PLAN:  PT FREQUENCY: 2x/week  PT DURATION: 4  weeks  PLANNED INTERVENTIONS: Therapeutic exercises, Therapeutic activity, Patient/Family education, Self Care, Orthotic/Fit training, Manual lymph drainage, scar mobilization, Vasopneumatic device, Manual therapy, and Re-evaluation  PLAN FOR NEXT SESSION: pt to bring her compression sleeve to check for fit, consider gauntlet for flying, progress to wand exercise, pec wall stretch, MFR to cording, Educated in MLD to UE for home use. Update HEP   Claris Pong, PT 02/23/2022, 1:07 PM

## 2022-02-23 ENCOUNTER — Ambulatory Visit: Payer: 59 | Attending: Obstetrics & Gynecology

## 2022-02-23 ENCOUNTER — Other Ambulatory Visit: Payer: Self-pay

## 2022-02-23 DIAGNOSIS — R293 Abnormal posture: Secondary | ICD-10-CM | POA: Insufficient documentation

## 2022-02-23 DIAGNOSIS — Z5189 Encounter for other specified aftercare: Secondary | ICD-10-CM | POA: Diagnosis not present

## 2022-02-23 DIAGNOSIS — M25611 Stiffness of right shoulder, not elsewhere classified: Secondary | ICD-10-CM | POA: Diagnosis not present

## 2022-02-23 DIAGNOSIS — I890 Lymphedema, not elsewhere classified: Secondary | ICD-10-CM | POA: Diagnosis not present

## 2022-02-23 DIAGNOSIS — Z853 Personal history of malignant neoplasm of breast: Secondary | ICD-10-CM | POA: Insufficient documentation

## 2022-02-23 NOTE — Patient Instructions (Signed)

## 2022-02-25 ENCOUNTER — Ambulatory Visit: Payer: 59

## 2022-02-25 DIAGNOSIS — R293 Abnormal posture: Secondary | ICD-10-CM

## 2022-02-25 DIAGNOSIS — M25611 Stiffness of right shoulder, not elsewhere classified: Secondary | ICD-10-CM

## 2022-02-25 DIAGNOSIS — Z853 Personal history of malignant neoplasm of breast: Secondary | ICD-10-CM

## 2022-02-25 DIAGNOSIS — I890 Lymphedema, not elsewhere classified: Secondary | ICD-10-CM | POA: Diagnosis not present

## 2022-02-25 NOTE — Patient Instructions (Signed)
SHOULDER: Flexion - Supine (Cane)        Cancer Rehab [phone]    Hold cane in both hands. Raise arms up overhead. Do not allow back to arch. Hold _5__ seconds. Do __5__ times; __1-2__ times a day.  Hands shoulder width Y position with arms Copyright  [employer]. All rights reserved.

## 2022-02-25 NOTE — Therapy (Signed)
OUTPATIENT PHYSICAL THERAPY ONCOLOGY TREATMENT  Patient Name: Yolanda [name] MRN: [id number] DOB:February 14, 1961, 62 y.o., female Today's Date: 02/25/2022  END OF SESSION:  PT End of Session - 02/25/22 1101     Visit Number 2    Number of Visits 8    Date for PT Re-Evaluation 03/23/22    PT Start Time 1102    Activity Tolerance Patient tolerated treatment well    Behavior During Therapy [facility] for tasks assessed/performed             Past Medical History:  Diagnosis Date   Anemia May 2022   Chemotherapy   Asthma    with respiratory infection   Cancer [facility])    breast cancer - right   Cataract March 2018   Optometrist visit   Diabetes mellitus without complication ([place])    type II   Fatty liver    Fibroid uterus    GERD (gastroesophageal reflux disease) June 2021   Hypertension    Patient reports that she has never been diagnosied with HTN, "I take HCTZ for swelling in my feet, if needed."   Osteoarthritis 03/10/2015   PONV (postoperative nausea and vomiting)    2014   Refusal of blood transfusions as patient is Jehovah's Witness    NO BLOOD PRODUCTS   Sleep apnea    Past Surgical History:  Procedure Laterality Date   BREAST CYST ASPIRATION     BREAST EXCISIONAL BIOPSY     BREAST LUMPECTOMY WITH SENTINEL LYMPH NODE BIOPSY Right 05/21/2020   Procedure: RIGHT BREAST LUMPECTOMY WITH SENTINEL LYMPH NODE BIOPSY;  Surgeon: Stark Klein, MD;  Location: [place];  Service: General;  Laterality: Right;   CHOLECYSTECTOMY  02/2009   COLONOSCOPY     DILATATION & CURRETTAGE/HYSTEROSCOPY WITH RESECTOCOPE N/A 03/13/2014   Procedure: DILATATION & CURETTAGE/HYSTEROSCOPY ;  Surgeon: Lyman Speller, MD;  Location: [place] [facility];  Service: Gynecology;  Laterality: N/A;   PORTA CATH REMOVAL  01/2021   PORTACATH PLACEMENT Left 05/21/2020   Procedure: INSERTION PORT-A-CATH;  Surgeon: Stark Klein, MD;  Location: [place];  Service: General;  Laterality: Left;   RADIOACTIVE SEED GUIDED  EXCISIONAL BREAST BIOPSY Left 12/30/2016   Procedure: RADIOACTIVE SEED GUIDED EXCISIONAL BREAST BIOPSY;  Surgeon: Stark Klein, MD;  Location: [place];  Service: General;  Laterality: Left;   Patient Active Problem List   Diagnosis Date Noted   Lymphedema of right arm 01/05/2022   Right ankle pain 05/12/2021   Bilateral hand pain 05/12/2021   Preventative health care 07/16/2020   Burning sensation of eye 07/16/2020   Port-A-Cath in place 06/18/2020   Malignant neoplasm of upper-outer quadrant of right breast in female, estrogen receptor positive ([place]) 04/18/2020   Hypercalcemia 04/10/2020   Diverticulosis of sigmoid colon 11/15/2019   Paresthesia and pain of right extremity 02/05/2019   Fatty liver 01/31/2018   Atypical lobular hyperplasia [facility]) of left breast 02/01/2017   Fibroadenoma 01/22/2017   Hyperlipidemia 06/12/2015   Diabetes mellitus type 2 in obese ([place]) 02/23/2014   Elevated alkaline phosphatase level 02/23/2014   Uterine fibroid 02/23/2014   OBESITY 11/26/2009   OBSTRUCTIVE SLEEP APNEA 06/01/2008   Essential hypertension 06/01/2008    PCP: Penni Homans, MD  REFERRING PROVIDER: Hale Bogus, MD  REFERRING DIAG: Right Breast Cancer/Lymphedema  THERAPY DIAG:  Lymphedema of breast  Abnormal posture  History of right breast cancer  Stiffness of right shoulder, not elsewhere classified  ONSET DATE: 12/30/2021  Rationale for Evaluation and Treatment: Rehabilitation  SUBJECTIVE:                                                                                                                                                                                           SUBJECTIVE STATEMEN My arm feels better since you released that cord. Now I can raise my arm straight up. The breast still hurts.  PERTINENT HISTORY:  Pt had a right lumpectomy with SLNB on 05/21/2020 .There were 7 LN's removed and none were malignant. She had a hematoma requiring  multiple aspirations. 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 She had chemotherapy ending on 11/13/2020 and radiation ending 03/27/2021. Several providers had noted breast changes .   PAIN:  Are you having pain? Yes NPRS scale: 1/10 Pain location: right arm, axilla  Pain orientation: Right  PAIN TYPE: aching and tight Pain description: intermittent  Aggravating factors: in bed, reaching, Relieving factors: not sure  PRECAUTIONS: Other: Right UE/breast lymphedema  WEIGHT BEARING RESTRICTIONS: No  FALLS:  Has patient fallen in last 6 months? No  LIVING ENVIRONMENT: Lives with: lives with their spouse Lives in: House/apartment Stairs: Yes; Internal: 17-20 steps; on left going up and External: 3 steps; none Has following equipment at home: None  OCCUPATION: retired,  LEISURE: sit outside, read  HAND DOMINANCE: right   PRIOR LEVEL OF FUNCTION: Independent  PATIENT GOALS: Learn how to treat herself at home and make it better   OBJECTIVE:  COGNITION: Overall cognitive status: Impaired Per pt report  PALPATION: Palpable cord right axillary region that runs into right lateral breast  OBSERVATIONS / OTHER ASSESSMENTS: Right breast with noted skin thickening from radiation, but no enlarged pores. Indentation noted at breast incision. Shortening of breast tissue noted from radiation. Right arm slightly larger, but no pitting edema and hard to tell if just hand dominance  SENSATION: Light touch: Appears intact POSTURE: forward head, rounded shoulders  UPPER EXTREMITY AROM/PROM:  A/PROM RIGHT   eval   Shoulder extension 55  Shoulder flexion 127  Shoulder abduction 131  Shoulder internal rotation 53  Shoulder external rotation 103    (Blank rows = not tested)  A/PROM LEFT   eval  Shoulder extension 50  Shoulder flexion 150  Shoulder abduction 163  Shoulder internal rotation 62  Shoulder external rotation 96    (Blank rows = not tested)  CERVICAL AROM: All within functional  limits:    UPPER EXTREMITY STRENGTH: WFL   LYMPHEDEMA ASSESSMENTS:   SURGERY TYPE/DATE:Right lumpectomy with SLNB  NUMBER OF LYMPH NODES REMOVED: 0/7  CHEMOTHERAPY: yes, ended 11/13/2020  RADIATION:Yes, ended 03/27/2021  HORMONE TREATMENT: Yes, anastrozole  INFECTIONS: NO  LYMPHEDEMA ASSESSMENTS:   LANDMARK RIGHT  eval  10 cm proximal to olecranon process 27.7  Olecranon process 24.5  10 cm proximal to ulnar styloid process 21.0  Just proximal to ulnar styloid process 14.9  Across hand at thumb web space 18.0  At base of 2nd digit 5.4  (Blank rows = not tested)  LANDMARK LEFT  eval  10 cm proximal to olecranon process 27.0  Olecranon process 23.6  10 cm proximal to ulnar styloid process 19.3  Just proximal to ulnar styloid process 14.35  Across hand at thumb web space 17.1  At base of 2nd digit 5.3  (Blank rows = not tested)     LLIS  18%  QUICK DASH SURVEY: NA   TODAY'S TREATMENT:                                                                                                                                         DATE:  02/25/2022 Overhead pulleys x 2 min flexion, 1 min scaption, 2 min abduction Discussed compression bras and gave pt script and info for [employer] [place] to see if she has coverage Checked compression sleeve. Fits fairly well except a little short at wrist and large at wrist. Measured pt and gave her measurements so she can order at home. MFR to right axillary region and upper arm/elbow region to decrease cording PROM right UE with pinning for flexion, abd, and D2 flexion. Supine wand flexion and scaption x 5 ea. Showed NTS   02/23/2022  PATIENT EDUCATION:  Education details: 4 post op exercises, cording, Performed exercises x 4-5 reps ea Person educated: Patient Education method: Explanation and Handouts Education comprehension: verbalized understanding and returned demonstration  HOME EXERCISE PROGRAM: 4 post op  exercises.  ASSESSMENT:  CLINICAL IMPRESSION: Pts arm felt much better after initial brief session of MFR to cording. Added pulleys and measured pt for sleeve. Continued MFR techniques for cording with cording still very evident and pt feeling good stretch but felt better afterwards   OBJECTIVE IMPAIRMENTS: decreased knowledge of condition, decreased ROM, increased fascial restrictions, impaired UE functional use, postural dysfunction, and pain.   ACTIVITY LIMITATIONS: sleeping, bed mobility, reach over head, and hygiene/grooming  PARTICIPATION LIMITATIONS: cleaning, laundry, and shopping  PERSONAL FACTORS: 1-2 comorbidities: Breast Cancer with 0/7 LN's, radiation  are also affecting patient's functional outcome.   REHAB POTENTIAL: Good  CLINICAL DECISION MAKING: Stable/uncomplicated  EVALUATION COMPLEXITY: Low  GOALS: Goals reviewed with patient? Yes  SHORT TERM GOALS =LONG TERM GOALS: Target date: 03/23/2021  Pt will be compliant  with use of compression bra/sleeve prn to decrease edema Baseline: Goal status: INITIAL  2.  Pt will note decreased UE/breast pain by greater than 50% Baseline:  Goal status: INITIAL  3.  Pt will be independent with MLD to reduce swelling and demonstrate independence in self care Baseline:  Goal status: INITIAL  4.  Pt will restore right shoulder ROM to WNL Baseline:  Goal status: INITIAL   PLAN:  PT FREQUENCY: 2x/week  PT DURATION: 4 weeks  PLANNED INTERVENTIONS: Therapeutic exercises, Therapeutic activity, Patient/Family education, Self Care, Orthotic/Fit training, Manual lymph drainage, scar mobilization, Vasopneumatic device, Manual therapy, and Re-evaluation  PLAN FOR NEXT SESSION: did she order new sleeve consider gauntlet for flying, progress to wand exercise, pec wall stretch, MFR to cording, Educated in MLD to UE for home use. Update HEP   Claris Pong, PT 02/25/2022, 11:02 AM

## 2022-03-02 ENCOUNTER — Ambulatory Visit: Payer: 59

## 2022-03-02 DIAGNOSIS — Z853 Personal history of malignant neoplasm of breast: Secondary | ICD-10-CM

## 2022-03-02 DIAGNOSIS — I890 Lymphedema, not elsewhere classified: Secondary | ICD-10-CM

## 2022-03-02 DIAGNOSIS — M25611 Stiffness of right shoulder, not elsewhere classified: Secondary | ICD-10-CM

## 2022-03-02 DIAGNOSIS — R293 Abnormal posture: Secondary | ICD-10-CM

## 2022-03-02 NOTE — Therapy (Signed)
OUTPATIENT PHYSICAL THERAPY ONCOLOGY TREATMENT  Patient Name: Yolanda [name] MRN: [id number] DOB:07-Jun-1960, 62 y.o., female Today's Date: 03/02/2022  END OF SESSION:  PT End of Session - 03/02/22 1100     Visit Number 3    Number of Visits 8    Date for PT Re-Evaluation 03/23/22    PT Start Time 1101    PT Stop Time 1150    PT Time Calculation (min) 49 min    Activity Tolerance Patient tolerated treatment well    Behavior During Therapy [facility] for tasks assessed/performed             Past Medical History:  Diagnosis Date   Anemia May 2022   Chemotherapy   Asthma    with respiratory infection   Cancer [facility])    breast cancer - right   Cataract March 2018   Optometrist visit   Diabetes mellitus without complication ([place])    type II   Fatty liver    Fibroid uterus    GERD (gastroesophageal reflux disease) June 2021   Hypertension    Patient reports that she has never been diagnosied with HTN, "I take HCTZ for swelling in my feet, if needed."   Osteoarthritis 03/10/2015   PONV (postoperative nausea and vomiting)    2014   Refusal of blood transfusions as patient is Jehovah's Witness    NO BLOOD PRODUCTS   Sleep apnea    Past Surgical History:  Procedure Laterality Date   BREAST CYST ASPIRATION     BREAST EXCISIONAL BIOPSY     BREAST LUMPECTOMY WITH SENTINEL LYMPH NODE BIOPSY Right 05/21/2020   Procedure: RIGHT BREAST LUMPECTOMY WITH SENTINEL LYMPH NODE BIOPSY;  Surgeon: Stark Klein, MD;  Location: [place];  Service: General;  Laterality: Right;   CHOLECYSTECTOMY  02/2009   COLONOSCOPY     DILATATION & CURRETTAGE/HYSTEROSCOPY WITH RESECTOCOPE N/A 03/13/2014   Procedure: DILATATION & CURETTAGE/HYSTEROSCOPY ;  Surgeon: Lyman Speller, MD;  Location: [facility];  Service: Gynecology;  Laterality: N/A;   PORTA CATH REMOVAL  01/2021   PORTACATH PLACEMENT Left 05/21/2020   Procedure: INSERTION PORT-A-CATH;  Surgeon: Stark Klein, MD;  Location: [place];  Service:  General;  Laterality: Left;   RADIOACTIVE SEED GUIDED EXCISIONAL BREAST BIOPSY Left 12/30/2016   Procedure: RADIOACTIVE SEED GUIDED EXCISIONAL BREAST BIOPSY;  Surgeon: Stark Klein, MD;  Location: [place];  Service: General;  Laterality: Left;   Patient Active Problem List   Diagnosis Date Noted   Lymphedema of right arm 01/05/2022   Right ankle pain 05/12/2021   Bilateral hand pain 05/12/2021   Preventative health care 07/16/2020   Burning sensation of eye 07/16/2020   Port-A-Cath in place 06/18/2020   Malignant neoplasm of upper-outer quadrant of right breast in female, estrogen receptor positive ([place]) 04/18/2020   Hypercalcemia 04/10/2020   Diverticulosis of sigmoid colon 11/15/2019   Paresthesia and pain of right extremity 02/05/2019   Fatty liver 01/31/2018   Atypical lobular hyperplasia [facility]) of left breast 02/01/2017   Fibroadenoma 01/22/2017   Hyperlipidemia 06/12/2015   Diabetes mellitus type 2 in obese ([place]) 02/23/2014   Elevated alkaline phosphatase level 02/23/2014   Uterine fibroid 02/23/2014   OBESITY 11/26/2009   OBSTRUCTIVE SLEEP APNEA 06/01/2008   Essential hypertension 06/01/2008    PCP: Penni Homans, MD  REFERRING PROVIDER: Hale Bogus, MD  REFERRING DIAG: Right Breast Cancer/Lymphedema  THERAPY DIAG:  Lymphedema of breast  Abnormal posture  History of right breast cancer  Stiffness of  right shoulder, not elsewhere classified  ONSET DATE: 12/30/2021  Rationale for Evaluation and Treatment: Rehabilitation  SUBJECTIVE:                                                                                                                                                                                           SUBJECTIVE STATEMENT The cording is doing better.  My breast had been doing better but last night I had a sharp pain in the breast. It is still there today and a little sharp. I did not call [facility] [place] [facility] yet. I  will do it this week.  PERTINENT HISTORY:  Pt had a right lumpectomy with SLNB on 05/21/2020 .There were 7 LN's removed and none were malignant. She had a hematoma requiring multiple aspirations. 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 She had chemotherapy ending on 11/13/2020 and radiation ending 03/27/2021. Several providers had noted breast changes .   PAIN:  Are you having pain? Yes NPRS scale: 4/10 breast, 3/10 axilla Pain location: Right breast, axilla  Pain orientation: Right  PAIN TYPE: aching and tight, occasionally sharp Pain description: intermittent  Aggravating factors: in bed, reaching, Relieving factors: not sure  PRECAUTIONS: Other: Right UE/breast lymphedema  WEIGHT BEARING RESTRICTIONS: No  FALLS:  Has patient fallen in last 6 months? No  LIVING ENVIRONMENT: Lives with: lives with their spouse Lives in: House/apartment Stairs: Yes; Internal: 17-20 steps; on left going up and External: 3 steps; none Has following equipment at home: None  OCCUPATION: retired,  LEISURE: sit outside, read  HAND DOMINANCE: right   PRIOR LEVEL OF FUNCTION: Independent  PATIENT GOALS: Learn how to treat herself at home and make it better   OBJECTIVE:  COGNITION: Overall cognitive status: Impaired Per pt report  PALPATION: Palpable cord right axillary region that runs into right lateral breast  OBSERVATIONS / OTHER ASSESSMENTS: Right breast with noted skin thickening from radiation, but no enlarged pores. Indentation noted at breast incision. Shortening of breast tissue noted from radiation. Right arm slightly larger, but no pitting edema and hard to tell if just hand dominance  SENSATION: Light touch: Appears intact POSTURE: forward head, rounded shoulders  UPPER EXTREMITY AROM/PROM:  A/PROM RIGHT   eval  RIGHT 03/02/2022  Shoulder extension 55   Shoulder flexion 127 156  Shoulder abduction 131 155  Shoulder internal rotation 53   Shoulder external rotation 103     (Blank rows  = not tested)  A/PROM LEFT   eval  Shoulder extension 50  Shoulder flexion 150  Shoulder abduction 163  Shoulder internal rotation 62  Shoulder external rotation  96    (Blank rows = not tested)  CERVICAL AROM: All within functional limits:    UPPER EXTREMITY STRENGTH: WFL   LYMPHEDEMA ASSESSMENTS:   SURGERY TYPE/DATE:Right lumpectomy with SLNB  NUMBER OF LYMPH NODES REMOVED: 0/7  CHEMOTHERAPY: yes, ended 11/13/2020  RADIATION:Yes, ended 03/27/2021  HORMONE TREATMENT: Yes, anastrozole  INFECTIONS: NO  LYMPHEDEMA ASSESSMENTS:   LANDMARK RIGHT  eval  10 cm proximal to olecranon process 27.7  Olecranon process 24.5  10 cm proximal to ulnar styloid process 21.0  Just proximal to ulnar styloid process 14.9  Across hand at thumb web space 18.0  At base of 2nd digit 5.4  (Blank rows = not tested)  LANDMARK LEFT  eval  10 cm proximal to olecranon process 27.0  Olecranon process 23.6  10 cm proximal to ulnar styloid process 19.3  Just proximal to ulnar styloid process 14.35  Across hand at thumb web space 17.1  At base of 2nd digit 5.3  (Blank rows = not tested)     LLIS  18%  QUICK DASH SURVEY: NA   TODAY'S TREATMENT:                                                                                                                                       DATE:  03/02/2022  Overhead pulleys x 2 min flexion, 1 min scaption, 2 min abduction MFR to right axillary region and upper arm/elbow region to decrease cording PROM right UE with pinning for flexion, abd, and D2 flexion. Supine bilateral shoulder flexion, scaption, horizontal abduction x 5 In supine: Short neck, 5 diaphragmatic breaths, L axillary nodes and establishment of interaxillary pathway, R inguinal nodes and establishment of axilloinguinal pathway, then R breast moving fluid towards pathways spending extra time in any areas of fibrosis then retracing all steps. Pt being educated in amt of stretch,  pressure, reasoning etc.   02/25/2022 Overhead pulleys x 2 min flexion, 1 min scaption, 2 min abduction Discussed compression bras and gave pt script and info for [facility] [place] to see if she has coverage Checked compression sleeve. Fits fairly well except a little short at wrist and large at wrist. Measured pt and gave her measurements so she can order at home. MFR to right axillary region and upper arm/elbow region to decrease cording PROM right UE with pinning for flexion, abd, and D2 flexion. Supine wand flexion and scaption x 5 ea. Showed NTS   02/23/2022  PATIENT EDUCATION:  Education details: 4 post op exercises, cording, Performed exercises x 4-5 reps ea Person educated: Patient Education method: Explanation and Handouts Education comprehension: verbalized understanding and returned demonstration  HOME EXERCISE PROGRAM: 4 post op exercises.  ASSESSMENT:  CLINICAL IMPRESSION: Good increase in Right shoulder AROM for flexion and abduction. Pts breast felt better after MLD today. Pt will try and call [facility] [place] today and will look into purchasing a sleeve, OBJECTIVE  IMPAIRMENTS: decreased knowledge of condition, decreased ROM, increased fascial restrictions, impaired UE functional use, postural dysfunction, and pain.   ACTIVITY LIMITATIONS: sleeping, bed mobility, reach over head, and hygiene/grooming  PARTICIPATION LIMITATIONS: cleaning, laundry, and shopping  PERSONAL FACTORS: 1-2 comorbidities: Breast Cancer with 0/7 LN's, radiation  are also affecting patient's functional outcome.   REHAB POTENTIAL: Good  CLINICAL DECISION MAKING: Stable/uncomplicated  EVALUATION COMPLEXITY: Low  GOALS: Goals reviewed with patient? Yes  SHORT TERM GOALS =LONG TERM GOALS: Target date: 03/23/2021  Pt will be compliant  with use of compression bra/sleeve prn to decrease edema Baseline: Goal status: INITIAL  2.  Pt will note decreased UE/breast pain by greater than  50% Baseline:  Goal status: INITIAL  3.  Pt will be independent with MLD to reduce swelling and demonstrate independence in self care Baseline:  Goal status: INITIAL  4.  Pt will restore right shoulder ROM to WNL Baseline:  Goal status: INITIAL   PLAN:  PT FREQUENCY: 2x/week  PT DURATION: 4 weeks  PLANNED INTERVENTIONS: Therapeutic exercises, Therapeutic activity, Patient/Family education, Self Care, Orthotic/Fit training, Manual lymph drainage, scar mobilization, Vasopneumatic device, Manual therapy, and Re-evaluation  PLAN FOR NEXT SESSION: Instruct pt in self MLD to breast ?upper arm,did she order new sleeve consider gauntlet for flying, progress to wand exercise, pec wall stretch, MFR to cording, Educated in MLD to UE for home use. Update HEP   Claris Pong, PT 03/02/2022, 11:55 AM

## 2022-03-04 ENCOUNTER — Ambulatory Visit: Payer: 59

## 2022-03-04 DIAGNOSIS — Z853 Personal history of malignant neoplasm of breast: Secondary | ICD-10-CM

## 2022-03-04 DIAGNOSIS — I890 Lymphedema, not elsewhere classified: Secondary | ICD-10-CM | POA: Diagnosis not present

## 2022-03-04 DIAGNOSIS — R293 Abnormal posture: Secondary | ICD-10-CM

## 2022-03-04 DIAGNOSIS — M25611 Stiffness of right shoulder, not elsewhere classified: Secondary | ICD-10-CM

## 2022-03-04 NOTE — Therapy (Signed)
OUTPATIENT PHYSICAL THERAPY ONCOLOGY TREATMENT  Patient Name: Yolanda [name] MRN: [id number] DOB:Jan 24, 1961, 62 y.o., female Today's Date: 03/04/2022  END OF SESSION:  PT End of Session - 03/04/22 1059     Visit Number 4    Number of Visits 8    Date for PT Re-Evaluation 03/23/22    PT Start Time 1100    PT Stop Time 1540    PT Time Calculation (min) 56 min    Activity Tolerance Patient tolerated treatment well    Behavior During Therapy [facility] for tasks assessed/performed             Past Medical History:  Diagnosis Date   Anemia May 2022   Chemotherapy   Asthma    with respiratory infection   Cancer [facility])    breast cancer - right   Cataract March 2018   Optometrist visit   Diabetes mellitus without complication ([place])    type II   Fatty liver    Fibroid uterus    GERD (gastroesophageal reflux disease) June 2021   Hypertension    Patient reports that she has never been diagnosied with HTN, "I take HCTZ for swelling in my feet, if needed."   Osteoarthritis 03/10/2015   PONV (postoperative nausea and vomiting)    2014   Refusal of blood transfusions as patient is Jehovah's Witness    NO BLOOD PRODUCTS   Sleep apnea    Past Surgical History:  Procedure Laterality Date   BREAST CYST ASPIRATION     BREAST EXCISIONAL BIOPSY     BREAST LUMPECTOMY WITH SENTINEL LYMPH NODE BIOPSY Right 05/21/2020   Procedure: RIGHT BREAST LUMPECTOMY WITH SENTINEL LYMPH NODE BIOPSY;  Surgeon: Stark Klein, MD;  Location: [place];  Service: General;  Laterality: Right;   CHOLECYSTECTOMY  02/2009   COLONOSCOPY     DILATATION & CURRETTAGE/HYSTEROSCOPY WITH RESECTOCOPE N/A 03/13/2014   Procedure: DILATATION & CURETTAGE/HYSTEROSCOPY ;  Surgeon: Lyman Speller, MD;  Location: [facility];  Service: Gynecology;  Laterality: N/A;   PORTA CATH REMOVAL  01/2021   PORTACATH PLACEMENT Left 05/21/2020   Procedure: INSERTION PORT-A-CATH;  Surgeon: Stark Klein, MD;  Location: [place];  Service:  General;  Laterality: Left;   RADIOACTIVE SEED GUIDED EXCISIONAL BREAST BIOPSY Left 12/30/2016   Procedure: RADIOACTIVE SEED GUIDED EXCISIONAL BREAST BIOPSY;  Surgeon: Stark Klein, MD;  Location: [place];  Service: General;  Laterality: Left;   Patient Active Problem List   Diagnosis Date Noted   Lymphedema of right arm 01/05/2022   Right ankle pain 05/12/2021   Bilateral hand pain 05/12/2021   Preventative health care 07/16/2020   Burning sensation of eye 07/16/2020   Port-A-Cath in place 06/18/2020   Malignant neoplasm of upper-outer quadrant of right breast in female, estrogen receptor positive ([place]) 04/18/2020   Hypercalcemia 04/10/2020   Diverticulosis of sigmoid colon 11/15/2019   Paresthesia and pain of right extremity 02/05/2019   Fatty liver 01/31/2018   Atypical lobular hyperplasia [facility]) of left breast 02/01/2017   Fibroadenoma 01/22/2017   Hyperlipidemia 06/12/2015   Diabetes mellitus type 2 in obese ([place]) 02/23/2014   Elevated alkaline phosphatase level 02/23/2014   Uterine fibroid 02/23/2014   OBESITY 11/26/2009   OBSTRUCTIVE SLEEP APNEA 06/01/2008   Essential hypertension 06/01/2008    PCP: Penni Homans, MD  REFERRING PROVIDER: Hale Bogus, MD  REFERRING DIAG: Right Breast Cancer/Lymphedema  THERAPY DIAG:  Lymphedema of breast  Abnormal posture  History of right breast cancer  Stiffness of  right shoulder, not elsewhere classified  ONSET DATE: 12/30/2021  Rationale for Evaluation and Treatment: Rehabilitation  SUBJECTIVE:                                                                                                                                                                                           SUBJECTIVE STATEMENT My breast felt better after the MLD last visit.. I was so relaxed I went home and layed down. I havet had any breast pain for a few days and I hardly feel the cording now. I found 2 sleeves that fit and I  brought the names of them. They were Medi and [employer].   PERTINENT HISTORY:  Pt had a right lumpectomy with SLNB on 05/21/2020 .There were 7 LN's removed and none were malignant. She had a hematoma requiring multiple aspirations. 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 She had chemotherapy ending on 11/13/2020 and radiation ending 03/27/2021. Several providers had noted breast changes .   PAIN:  Are you having pain? Yes NPRS scale: 4/10 breast, 3/10 axilla Pain location: Right breast, axilla  Pain orientation: Right  PAIN TYPE: aching and tight, occasionally sharp Pain description: intermittent  Aggravating factors: in bed, reaching, Relieving factors: not sure  PRECAUTIONS: Other: Right UE/breast lymphedema  WEIGHT BEARING RESTRICTIONS: No  FALLS:  Has patient fallen in last 6 months? No  LIVING ENVIRONMENT: Lives with: lives with their spouse Lives in: House/apartment Stairs: Yes; Internal: 17-20 steps; on left going up and External: 3 steps; none Has following equipment at home: None  OCCUPATION: retired,  LEISURE: sit outside, read  HAND DOMINANCE: right   PRIOR LEVEL OF FUNCTION: Independent  PATIENT GOALS: Learn how to treat herself at home and make it better   OBJECTIVE:  COGNITION: Overall cognitive status: Impaired Per pt report  PALPATION: Palpable cord right axillary region that runs into right lateral breast  OBSERVATIONS / OTHER ASSESSMENTS: Right breast with noted skin thickening from radiation, but no enlarged pores. Indentation noted at breast incision. Shortening of breast tissue noted from radiation. Right arm slightly larger, but no pitting edema and hard to tell if just hand dominance  SENSATION: Light touch: Appears intact POSTURE: forward head, rounded shoulders  UPPER EXTREMITY AROM/PROM:  A/PROM RIGHT   eval  RIGHT 03/02/2022  Shoulder extension 55   Shoulder flexion 127 156  Shoulder abduction 131 155  Shoulder internal rotation 53   Shoulder  external rotation 103     (Blank rows = not tested)  A/PROM LEFT   eval  Shoulder extension 50  Shoulder flexion 150  Shoulder abduction 163  Shoulder internal rotation  62  Shoulder external rotation 96    (Blank rows = not tested)  CERVICAL AROM: All within functional limits:    UPPER EXTREMITY STRENGTH: WFL   LYMPHEDEMA ASSESSMENTS:   SURGERY TYPE/DATE:Right lumpectomy with SLNB  NUMBER OF LYMPH NODES REMOVED: 0/7  CHEMOTHERAPY: yes, ended 11/13/2020  RADIATION:Yes, ended 03/27/2021  HORMONE TREATMENT: Yes, anastrozole  INFECTIONS: NO  LYMPHEDEMA ASSESSMENTS:   LANDMARK RIGHT  eval  10 cm proximal to olecranon process 27.7  Olecranon process 24.5  10 cm proximal to ulnar styloid process 21.0  Just proximal to ulnar styloid process 14.9  Across hand at thumb web space 18.0  At base of 2nd digit 5.4  (Blank rows = not tested)  LANDMARK LEFT  eval  10 cm proximal to olecranon process 27.0  Olecranon process 23.6  10 cm proximal to ulnar styloid process 19.3  Just proximal to ulnar styloid process 14.35  Across hand at thumb web space 17.1  At base of 2nd digit 5.3  (Blank rows = not tested)     LLIS  18%  QUICK DASH SURVEY: NA   TODAY'S TREATMENT:                                                                                                                                       DATE:   03/04/2022 Discussed compression sleeves and pt will go with the Medi sleeve and gauntlet Class 1. MFR to right axillary region and upper arm/elbow region to decrease cording PROM right UE with pinning for flexion, abd, and D2 flexion. Supine bilateral shoulder flexion, scaption, horizontal abduction x 5 In supine: Short neck, 5 diaphragmatic breaths, L axillary nodes and establishment of interaxillary pathway, R inguinal nodes and establishment of axilloinguinal pathway, then R breast moving fluid towards pathways spending extra time in any areas of fibrosis then  retracing all steps. Pt being educated in amt of stretch, pressure, and practicing each step several times Pt given written instructions for breast MLD   03/02/2022  Overhead pulleys x 2 min flexion, 1 min scaption, 2 min abduction MFR to right axillary region and upper arm/elbow region to decrease cording PROM right UE with pinning for flexion, abd, and D2 flexion. Supine bilateral shoulder flexion, scaption, horizontal abduction x 5 In supine: Short neck, 5 diaphragmatic breaths, L axillary nodes and establishment of interaxillary pathway, R inguinal nodes and establishment of axilloinguinal pathway, then R breast moving fluid towards pathways spending extra time in any areas of fibrosis then retracing all steps. Pt being educated in amt of stretch, pressure, reasoning etc.   02/25/2022 Overhead pulleys x 2 min flexion, 1 min scaption, 2 min abduction Discussed compression bras and gave pt script and info for [facility] [place] to see if she has coverage Checked compression sleeve. Fits fairly well except a little short at wrist and large at wrist. Measured pt and gave her measurements so she  can order at home. MFR to right axillary region and upper arm/elbow region to decrease cording PROM right UE with pinning for flexion, abd, and D2 flexion. Supine wand flexion and scaption x 5 ea. Showed NTS   02/23/2022  PATIENT EDUCATION:  Education details: 4 post op exercises, cording, Performed exercises x 4-5 reps ea Person educated: Patient Education method: Explanation and Handouts Education comprehension: verbalized understanding and returned demonstration  HOME EXERCISE PROGRAM: 4 post op exercises.  ASSESSMENT:  CLINICAL IMPRESSION: Pt did very well today with good pressure and stretch and occasional VC's for breast MLD. Cording is still present but ROM is not limited much by cording. Pt looked at sleeves and will go ahead and order the Medi sleeve/gauntlet.   OBJECTIVE IMPAIRMENTS:  decreased knowledge of condition, decreased ROM, increased fascial restrictions, impaired UE functional use, postural dysfunction, and pain.   ACTIVITY LIMITATIONS: sleeping, bed mobility, reach over head, and hygiene/grooming  PARTICIPATION LIMITATIONS: cleaning, laundry, and shopping  PERSONAL FACTORS: 1-2 comorbidities: Breast Cancer with 0/7 LN's, radiation  are also affecting patient's functional outcome.   REHAB POTENTIAL: Good  CLINICAL DECISION MAKING: Stable/uncomplicated  EVALUATION COMPLEXITY: Low  GOALS: Goals reviewed with patient? Yes  SHORT TERM GOALS =LONG TERM GOALS: Target date: 03/23/2021  Pt will be compliant  with use of compression bra/sleeve prn to decrease edema Baseline: Goal status: INITIAL  2.  Pt will note decreased UE/breast pain by greater than 50% Baseline:  Goal status: INITIAL  3.  Pt will be independent with MLD to reduce swelling and demonstrate independence in self care Baseline:  Goal status: INITIAL  4.  Pt will restore right shoulder ROM to WNL Baseline:  Goal status: INITIAL   PLAN:  PT FREQUENCY: 2x/week  PT DURATION: 4 weeks  PLANNED INTERVENTIONS: Therapeutic exercises, Therapeutic activity, Patient/Family education, Self Care, Orthotic/Fit training, Manual lymph drainage, scar mobilization, Vasopneumatic device, Manual therapy, and Re-evaluation  PLAN FOR NEXT SESSION: review self MLD to breast upper arm,did she order new sleeve consider gauntlet for flying, , pec wall stretch, MFR to cording, Educated in MLD to UE for home use. Update HEP, start strength   Claris Pong, PT 03/04/2022, 12:00 PM

## 2022-03-09 ENCOUNTER — Ambulatory Visit: Payer: 59

## 2022-03-09 DIAGNOSIS — I890 Lymphedema, not elsewhere classified: Secondary | ICD-10-CM

## 2022-03-09 DIAGNOSIS — R293 Abnormal posture: Secondary | ICD-10-CM

## 2022-03-09 DIAGNOSIS — M25611 Stiffness of right shoulder, not elsewhere classified: Secondary | ICD-10-CM

## 2022-03-09 DIAGNOSIS — Z853 Personal history of malignant neoplasm of breast: Secondary | ICD-10-CM

## 2022-03-09 NOTE — Therapy (Signed)
OUTPATIENT PHYSICAL THERAPY ONCOLOGY TREATMENT  Patient Name: Yolanda [name] MRN: [id number] DOB:[birth year], 62 y.o., female Today's Date: 03/09/2022  END OF SESSION:  PT End of Session - 03/09/22 1102     Visit Number 5    Number of Visits 8    Date for PT Re-Evaluation 03/23/22    PT Start Time 1103    PT Stop Time 1156    PT Time Calculation (min) 53 min    Activity Tolerance Patient tolerated treatment well    Behavior During Therapy [facility] for tasks assessed/performed             Past Medical History:  Diagnosis Date   Anemia May 2022   Chemotherapy   Asthma    with respiratory infection   Cancer [facility])    breast cancer - right   Cataract March 2018   Optometrist visit   Diabetes mellitus without complication ([place])    type II   Fatty liver    Fibroid uterus    GERD (gastroesophageal reflux disease) June 2021   Hypertension    Patient reports that she has never been diagnosied with HTN, "I take HCTZ for swelling in my feet, if needed."   Osteoarthritis 03/10/2015   PONV (postoperative nausea and vomiting)    2014   Refusal of blood transfusions as patient is Jehovah's Witness    NO BLOOD PRODUCTS   Sleep apnea    Past Surgical History:  Procedure Laterality Date   BREAST CYST ASPIRATION     BREAST EXCISIONAL BIOPSY     BREAST LUMPECTOMY WITH SENTINEL LYMPH NODE BIOPSY Right 05/21/2020   Procedure: RIGHT BREAST LUMPECTOMY WITH SENTINEL LYMPH NODE BIOPSY;  Surgeon: Stark Klein, MD;  Location: [place];  Service: General;  Laterality: Right;   CHOLECYSTECTOMY  02/2009   COLONOSCOPY     DILATATION & CURRETTAGE/HYSTEROSCOPY WITH RESECTOCOPE N/A 03/13/2014   Procedure: DILATATION & CURETTAGE/HYSTEROSCOPY ;  Surgeon: Lyman Speller, MD;  Location: [facility];  Service: Gynecology;  Laterality: N/A;   PORTA CATH REMOVAL  01/2021   PORTACATH PLACEMENT Left 05/21/2020   Procedure: INSERTION PORT-A-CATH;  Surgeon: Stark Klein, MD;  Location: [place];  Service:  General;  Laterality: Left;   RADIOACTIVE SEED GUIDED EXCISIONAL BREAST BIOPSY Left 12/30/2016   Procedure: RADIOACTIVE SEED GUIDED EXCISIONAL BREAST BIOPSY;  Surgeon: Stark Klein, MD;  Location: [place];  Service: General;  Laterality: Left;   Patient Active Problem List   Diagnosis Date Noted   Lymphedema of right arm 01/05/2022   Right ankle pain 05/12/2021   Bilateral hand pain 05/12/2021   Preventative health care 07/16/2020   Burning sensation of eye 07/16/2020   Port-A-Cath in place 06/18/2020   Malignant neoplasm of upper-outer quadrant of right breast in female, estrogen receptor positive ([place]) 04/18/2020   Hypercalcemia 04/10/2020   Diverticulosis of sigmoid colon 11/15/2019   Paresthesia and pain of right extremity 02/05/2019   Fatty liver 01/31/2018   Atypical lobular hyperplasia [facility]) of left breast 02/01/2017   Fibroadenoma 01/22/2017   Hyperlipidemia 06/12/2015   Diabetes mellitus type 2 in obese ([place]) 02/23/2014   Elevated alkaline phosphatase level 02/23/2014   Uterine fibroid 02/23/2014   OBESITY 11/26/2009   OBSTRUCTIVE SLEEP APNEA 06/01/2008   Essential hypertension 06/01/2008    PCP: Penni Homans, MD  REFERRING PROVIDER: Hale Bogus, MD  REFERRING DIAG: Right Breast Cancer/Lymphedema  THERAPY DIAG:  Lymphedema of breast  Abnormal posture  History of right breast cancer  Stiffness of  right shoulder, not elsewhere classified  ONSET DATE: 12/30/2021  Rationale for Evaluation and Treatment: Rehabilitation  SUBJECTIVE:                                                                                                                                                                                           SUBJECTIVE STATEMENT Things are going well. I had a little pain over the weekend at the lateral breast and axilla.  I ordered the sleve and it should come this week. I left a message for [employer]  PERTINENT HISTORY:  Pt  had a right lumpectomy with SLNB on 05/21/2020 .There were 7 LN's removed and none were malignant. She had a hematoma requiring multiple aspirations. 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 She had chemotherapy ending on 11/13/2020 and radiation ending 03/27/2021. Several providers had noted breast changes .   PAIN:  Are you having pain? Yes NPRS scale: 0/10 breast but tender to touch., 1/10 axilla Pain location: Right breast, axilla  Pain orientation: Right  PAIN TYPE: aching and tight, occasionally sharp, tender in breast Pain description: intermittent  Aggravating factors: in bed, reaching, Relieving factors: not sure  PRECAUTIONS: Other: Right UE/breast lymphedema  WEIGHT BEARING RESTRICTIONS: No  FALLS:  Has patient fallen in last 6 months? No  LIVING ENVIRONMENT: Lives with: lives with their spouse Lives in: House/apartment Stairs: Yes; Internal: 17-20 steps; on left going up and External: 3 steps; none Has following equipment at home: None  OCCUPATION: retired,  LEISURE: sit outside, read  HAND DOMINANCE: right   PRIOR LEVEL OF FUNCTION: Independent  PATIENT GOALS: Learn how to treat herself at home and make it better   OBJECTIVE:  COGNITION: Overall cognitive status: Impaired Per pt report  PALPATION: Palpable cord right axillary region that runs into right lateral breast  OBSERVATIONS / OTHER ASSESSMENTS: Right breast with noted skin thickening from radiation, but no enlarged pores. Indentation noted at breast incision. Shortening of breast tissue noted from radiation. Right arm slightly larger, but no pitting edema and hard to tell if just hand dominance  SENSATION: Light touch: Appears intact POSTURE: forward head, rounded shoulders  UPPER EXTREMITY AROM/PROM:  A/PROM RIGHT   eval  RIGHT 03/02/2022  Shoulder extension 55   Shoulder flexion 127 156  Shoulder abduction 131 155  Shoulder internal rotation 53   Shoulder external rotation 103     (Blank rows = not  tested)  A/PROM LEFT   eval  Shoulder extension 50  Shoulder flexion 150  Shoulder abduction 163  Shoulder internal rotation 62  Shoulder external rotation 96    (Blank rows =  not tested)  CERVICAL AROM: All within functional limits:    UPPER EXTREMITY STRENGTH: WFL   LYMPHEDEMA ASSESSMENTS:   SURGERY TYPE/DATE:Right lumpectomy with SLNB  NUMBER OF LYMPH NODES REMOVED: 0/7  CHEMOTHERAPY: yes, ended 11/13/2020  RADIATION:Yes, ended 03/27/2021  HORMONE TREATMENT: Yes, anastrozole  INFECTIONS: NO  LYMPHEDEMA ASSESSMENTS:   LANDMARK RIGHT  eval  10 cm proximal to olecranon process 27.7  Olecranon process 24.5  10 cm proximal to ulnar styloid process 21.0  Just proximal to ulnar styloid process 14.9  Across hand at thumb web space 18.0  At base of 2nd digit 5.4  (Blank rows = not tested)  LANDMARK LEFT  eval  10 cm proximal to olecranon process 27.0  Olecranon process 23.6  10 cm proximal to ulnar styloid process 19.3  Just proximal to ulnar styloid process 14.35  Across hand at thumb web space 17.1  At base of 2nd digit 5.3  (Blank rows = not tested)     LLIS  18%  QUICK DASH SURVEY: NA   TODAY'S TREATMENT:      Overhead pulleys x 2 min flexion and 1 min. Abd Ball rolls on wall x 5 forward and 5 abduction MFR to right axillary region and upper arm/elbow region to decrease cording PROM right UE with pinning for flexion, abd, and D2 flexion. Pt performed all steps of right breast MLD with education by PT prn for direction, sequence and stretch.Short neck, 5 diaphragmatic breaths, L axillary nodes and establishment of interaxillary pathway, R inguinal nodes and establishment of axilloinguinal pathway, then R breast moving fluid towards pathways spending extra time in any areas of fibrosis then retracing all steps.                                                                                                                                   DATE:    03/04/2022 Discussed compression sleeves and pt will go with the Medi sleeve and gauntlet Class 1. MFR to right axillary region and upper arm/elbow region to decrease cording PROM right UE with pinning for flexion, abd, and D2 flexion. Supine bilateral shoulder flexion, scaption, horizontal abduction x 5 In supine: Short neck, 5 diaphragmatic breaths, L axillary nodes and establishment of interaxillary pathway, R inguinal nodes and establishment of axilloinguinal pathway, then R breast moving fluid towards pathways spending extra time in any areas of fibrosis then retracing all steps. Pt being educated in amt of stretch, pressure, and practicing each step several times Pt given written instructions for breast MLD   03/02/2022  Overhead pulleys x 2 min flexion, 1 min scaption, 2 min abduction MFR to right axillary region and upper arm/elbow region to decrease cording PROM right UE with pinning for flexion, abd, and D2 flexion. Supine bilateral shoulder flexion, scaption, horizontal abduction x 5 In supine: Short neck, 5 diaphragmatic breaths, L axillary nodes and establishment of interaxillary pathway, R inguinal nodes and establishment of  axilloinguinal pathway, then R breast moving fluid towards pathways spending extra time in any areas of fibrosis then retracing all steps. Pt being educated in amt of stretch, pressure, reasoning etc.   02/25/2022 Overhead pulleys x 2 min flexion, 1 min scaption, 2 min abduction Discussed compression bras and gave pt script and info for [facility] [place] to see if she has coverage Checked compression sleeve. Fits fairly well except a little short at wrist and large at wrist. Measured pt and gave her measurements so she can order at home. MFR to right axillary region and upper arm/elbow region to decrease cording PROM right UE with pinning for flexion, abd, and D2 flexion. Supine wand flexion and scaption x 5 ea. Showed NTS   02/23/2022  PATIENT EDUCATION:   Education details: 4 post op exercises, cording, Performed exercises x 4-5 reps ea Person educated: Patient Education method: Explanation and Handouts Education comprehension: verbalized understanding and returned demonstration  HOME EXERCISE PROGRAM: 4 post op exercises.  ASSESSMENT:  CLINICAL IMPRESSION: Several small cords still present in axilla but not interfering with motion. Pt did exceptionally well with self MLD with only minor adjustments to technique and with pt using excellent stretch. No tenderness in breast after MLD  OBJECTIVE IMPAIRMENTS: decreased knowledge of condition, decreased ROM, increased fascial restrictions, impaired UE functional use, postural dysfunction, and pain.   ACTIVITY LIMITATIONS: sleeping, bed mobility, reach over head, and hygiene/grooming  PARTICIPATION LIMITATIONS: cleaning, laundry, and shopping  PERSONAL FACTORS: 1-2 comorbidities: Breast Cancer with 0/7 LN's, radiation  are also affecting patient's functional outcome.   REHAB POTENTIAL: Good  CLINICAL DECISION MAKING: Stable/uncomplicated  EVALUATION COMPLEXITY: Low  GOALS: Goals reviewed with patient? Yes  SHORT TERM GOALS =LONG TERM GOALS: Target date: 03/23/2021  Pt will be compliant  with use of compression bra/sleeve prn to decrease edema Baseline: Goal status: INITIAL  2.  Pt will note decreased UE/breast pain by greater than 50% Baseline:  Goal status: INITIAL  3.  Pt will be independent with MLD to reduce swelling and demonstrate independence in self care Baseline:  Goal status: INITIAL  4.  Pt will restore right shoulder ROM to WNL Baseline:  Goal status: INITIAL   PLAN:  PT FREQUENCY: 2x/week  PT DURATION: 4 weeks  PLANNED INTERVENTIONS: Therapeutic exercises, Therapeutic activity, Patient/Family education, Self Care, Orthotic/Fit training, Manual lymph drainage, scar mobilization, Vasopneumatic device, Manual therapy, and Re-evaluation  PLAN FOR NEXT  SESSION: review self MLD to breast upper arm,did she order new sleeve consider gauntlet for flying, , pec wall stretch, MFR to cording, Educated in MLD to UE for home use. Update HEP, start strength   Claris Pong, PT 03/09/2022, 12:02 PM

## 2022-03-11 ENCOUNTER — Ambulatory Visit: Payer: 59

## 2022-03-11 DIAGNOSIS — R293 Abnormal posture: Secondary | ICD-10-CM

## 2022-03-11 DIAGNOSIS — M25611 Stiffness of right shoulder, not elsewhere classified: Secondary | ICD-10-CM

## 2022-03-11 DIAGNOSIS — I890 Lymphedema, not elsewhere classified: Secondary | ICD-10-CM | POA: Diagnosis not present

## 2022-03-11 DIAGNOSIS — Z853 Personal history of malignant neoplasm of breast: Secondary | ICD-10-CM

## 2022-03-11 NOTE — Therapy (Signed)
OUTPATIENT PHYSICAL THERAPY ONCOLOGY TREATMENT  Patient Name: Yolanda [name] MRN: [id number] DOB:Jan 05, 1961, 62 y.o., female Today's Date: 03/11/2022  END OF SESSION:  PT End of Session - 03/11/22 1103     Visit Number 6    Number of Visits 8    Date for PT Re-Evaluation 03/23/22    PT Start Time 1104    PT Stop Time 1157    PT Time Calculation (min) 53 min    Activity Tolerance Patient tolerated treatment well    Behavior During Therapy [facility] for tasks assessed/performed             Past Medical History:  Diagnosis Date   Anemia May 2022   Chemotherapy   Asthma    with respiratory infection   Cancer [facility])    breast cancer - right   Cataract March 2018   Optometrist visit   Diabetes mellitus without complication ([place])    type II   Fatty liver    Fibroid uterus    GERD (gastroesophageal reflux disease) June 2021   Hypertension    Patient reports that she has never been diagnosied with HTN, "I take HCTZ for swelling in my feet, if needed."   Osteoarthritis 03/10/2015   PONV (postoperative nausea and vomiting)    2014   Refusal of blood transfusions as patient is Jehovah's Witness    NO BLOOD PRODUCTS   Sleep apnea    Past Surgical History:  Procedure Laterality Date   BREAST CYST ASPIRATION     BREAST EXCISIONAL BIOPSY     BREAST LUMPECTOMY WITH SENTINEL LYMPH NODE BIOPSY Right 05/21/2020   Procedure: RIGHT BREAST LUMPECTOMY WITH SENTINEL LYMPH NODE BIOPSY;  Surgeon: Stark Klein, MD;  Location: [place];  Service: General;  Laterality: Right;   CHOLECYSTECTOMY  02/2009   COLONOSCOPY     DILATATION & CURRETTAGE/HYSTEROSCOPY WITH RESECTOCOPE N/A 03/13/2014   Procedure: DILATATION & CURETTAGE/HYSTEROSCOPY ;  Surgeon: Lyman Speller, MD;  Location: [facility];  Service: Gynecology;  Laterality: N/A;   PORTA CATH REMOVAL  01/2021   PORTACATH PLACEMENT Left 05/21/2020   Procedure: INSERTION PORT-A-CATH;  Surgeon: Stark Klein, MD;  Location: [place];  Service:  General;  Laterality: Left;   RADIOACTIVE SEED GUIDED EXCISIONAL BREAST BIOPSY Left 12/30/2016   Procedure: RADIOACTIVE SEED GUIDED EXCISIONAL BREAST BIOPSY;  Surgeon: Stark Klein, MD;  Location: [place];  Service: General;  Laterality: Left;   Patient Active Problem List   Diagnosis Date Noted   Lymphedema of right arm 01/05/2022   Right ankle pain 05/12/2021   Bilateral hand pain 05/12/2021   Preventative health care 07/16/2020   Burning sensation of eye 07/16/2020   Port-A-Cath in place 06/18/2020   Malignant neoplasm of upper-outer quadrant of right breast in female, estrogen receptor positive ([place]) 04/18/2020   Hypercalcemia 04/10/2020   Diverticulosis of sigmoid colon 11/15/2019   Paresthesia and pain of right extremity 02/05/2019   Fatty liver 01/31/2018   Atypical lobular hyperplasia [facility]) of left breast 02/01/2017   Fibroadenoma 01/22/2017   Hyperlipidemia 06/12/2015   Diabetes mellitus type 2 in obese ([place]) 02/23/2014   Elevated alkaline phosphatase level 02/23/2014   Uterine fibroid 02/23/2014   OBESITY 11/26/2009   OBSTRUCTIVE SLEEP APNEA 06/01/2008   Essential hypertension 06/01/2008    PCP: Penni Homans, MD  REFERRING PROVIDER: Hale Bogus, MD  REFERRING DIAG: Right Breast Cancer/Lymphedema  THERAPY DIAG:  Lymphedema of breast  Abnormal posture  History of right breast cancer  Stiffness of  right shoulder, not elsewhere classified  ONSET DATE: 12/30/2021  Rationale for Evaluation and Treatment: Rehabilitation  SUBJECTIVE:                                                                                                                                                                                           SUBJECTIVE STATEMENT Sleeve came yesterday. I tried it on. It feels good.  PERTINENT HISTORY:  Pt had a right lumpectomy with SLNB on 05/21/2020 .There were 7 LN's removed and none were malignant. She had a hematoma requiring  multiple aspirations. 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 She had chemotherapy ending on 11/13/2020 and radiation ending 03/27/2021. Several providers had noted breast changes .   PAIN:  Are you having pain? Yes NPRS scale: 0/10 breast but tender to touch., 1/10 axilla Pain location: Right breast, axilla  Pain orientation: Right  PAIN TYPE: aching and tight, occasionally sharp, tender in breast Pain description: intermittent  Aggravating factors: in bed, reaching, Relieving factors: not sure  PRECAUTIONS: Other: Right UE/breast lymphedema  WEIGHT BEARING RESTRICTIONS: No  FALLS:  Has patient fallen in last 6 months? No  LIVING ENVIRONMENT: Lives with: lives with their spouse Lives in: House/apartment Stairs: Yes; Internal: 17-20 steps; on left going up and External: 3 steps; none Has following equipment at home: None  OCCUPATION: retired,  LEISURE: sit outside, read  HAND DOMINANCE: right   PRIOR LEVEL OF FUNCTION: Independent  PATIENT GOALS: Learn how to treat herself at home and make it better   OBJECTIVE:  COGNITION: Overall cognitive status: Impaired Per pt report  PALPATION: Palpable cord right axillary region that runs into right lateral breast  OBSERVATIONS / OTHER ASSESSMENTS: Right breast with noted skin thickening from radiation, but no enlarged pores. Indentation noted at breast incision. Shortening of breast tissue noted from radiation. Right arm slightly larger, but no pitting edema and hard to tell if just hand dominance  SENSATION: Light touch: Appears intact POSTURE: forward head, rounded shoulders  UPPER EXTREMITY AROM/PROM:  A/PROM RIGHT   eval  RIGHT 03/02/2022 Right 03/11/2022  Shoulder extension 55    Shoulder flexion 127 156 162  Shoulder abduction 131 155 170  Shoulder internal rotation 53  74  Shoulder external rotation 103  109    (Blank rows = not tested)  A/PROM LEFT   eval  Shoulder extension 50  Shoulder flexion 150  Shoulder  abduction 163  Shoulder internal rotation 62  Shoulder external rotation 96    (Blank rows = not tested)  CERVICAL AROM: All within functional limits:    UPPER EXTREMITY STRENGTH: WFL   LYMPHEDEMA  ASSESSMENTS:   SURGERY TYPE/DATE:Right lumpectomy with SLNB  NUMBER OF LYMPH NODES REMOVED: 0/7  CHEMOTHERAPY: yes, ended 11/13/2020  RADIATION:Yes, ended 03/27/2021  HORMONE TREATMENT: Yes, anastrozole  INFECTIONS: NO  LYMPHEDEMA ASSESSMENTS:   LANDMARK RIGHT  eval  10 cm proximal to olecranon process 27.7  Olecranon process 24.5  10 cm proximal to ulnar styloid process 21.0  Just proximal to ulnar styloid process 14.9  Across hand at thumb web space 18.0  At base of 2nd digit 5.4  (Blank rows = not tested)  LANDMARK LEFT  eval  10 cm proximal to olecranon process 27.0  Olecranon process 23.6  10 cm proximal to ulnar styloid process 19.3  Just proximal to ulnar styloid process 14.35  Across hand at thumb web space 17.1  At base of 2nd digit 5.3  (Blank rows = not tested)     LLIS  18%  QUICK DASH SURVEY: NA   TODAY'S TREATMENT:    03/11/2022  Practiced donning/doffing class 1 Size 2 Medi Harmony sleeve and gauntlet. Showed pt how to use rubber gloves to don Measured right shoulder ROM Pt performed all steps of right breast MLD with education by PT prn for direction, sequence and stretch.Short neck, 5 diaphragmatic breaths, L axillary nodes and establishment of interaxillary pathway, R inguinal nodes and establishment of axilloinguinal pathway, then R breast moving fluid towards pathways then retracing all steps.   MFR to right axillary region and upper arm/elbow region to decrease cording PROM right UE with pinning for flexion, abd, and D2 flexion 03/09/2022 Overhead pulleys x 2 min flexion and 1 min. Abd Ball rolls on wall x 5 forward and 5 abduction MFR to right axillary region and upper arm/elbow region to decrease cording PROM right UE with pinning for  flexion, abd, and D2 flexion. Pt performed all steps of right breast MLD with education by PT prn for direction, sequence and stretch.Short neck, 5 diaphragmatic breaths, L axillary nodes and establishment of interaxillary pathway, R inguinal nodes and establishment of axilloinguinal pathway, then R breast moving fluid towards pathways spending extra time in any areas of fibrosis then retracing all steps.                                                                                                                                   DATE:   03/04/2022 Discussed compression sleeves and pt will go with the Medi sleeve and gauntlet Class 1. MFR to right axillary region and upper arm/elbow region to decrease cording PROM right UE with pinning for flexion, abd, and D2 flexion. Supine bilateral shoulder flexion, scaption, horizontal abduction x 5 In supine: Short neck, 5 diaphragmatic breaths, L axillary nodes and establishment of interaxillary pathway, R inguinal nodes and establishment of axilloinguinal pathway, then R breast moving fluid towards pathways spending extra time in any areas of fibrosis then retracing all steps. Pt being educated in amt of stretch, pressure, and practicing  each step several times Pt given written instructions for breast MLD   03/02/2022  Overhead pulleys x 2 min flexion, 1 min scaption, 2 min abduction MFR to right axillary region and upper arm/elbow region to decrease cording PROM right UE with pinning for flexion, abd, and D2 flexion. Supine bilateral shoulder flexion, scaption, horizontal abduction x 5 In supine: Short neck, 5 diaphragmatic breaths, L axillary nodes and establishment of interaxillary pathway, R inguinal nodes and establishment of axilloinguinal pathway, then R breast moving fluid towards pathways spending extra time in any areas of fibrosis then retracing all steps. Pt being educated in amt of stretch, pressure, reasoning etc.   02/25/2022 Overhead  pulleys x 2 min flexion, 1 min scaption, 2 min abduction Discussed compression bras and gave pt script and info for [employer] [place] to see if she has coverage Checked compression sleeve. Fits fairly well except a little short at wrist and large at wrist. Measured pt and gave her measurements so she can order at home. MFR to right axillary region and upper arm/elbow region to decrease cording PROM right UE with pinning for flexion, abd, and D2 flexion. Supine wand flexion and scaption x 5 ea. Showed NTS   02/23/2022  PATIENT EDUCATION:  Education details: 4 post op exercises, cording, Performed exercises x 4-5 reps ea Person educated: Patient Education method: Explanation and Handouts Education comprehension: verbalized understanding and returned demonstration  HOME EXERCISE PROGRAM: 4 post op exercises.  ASSESSMENT:  CLINICAL IMPRESSION: Pt was able to don and doff her sleeve and gauntlet independently. She was able to return demonstrate MLD with only occasional VC's and she is using very good pressure and technique.She achieved ROM goals and pain goal. Should be ready for DC next visit.  OBJECTIVE IMPAIRMENTS: decreased knowledge of condition, decreased ROM, increased fascial restrictions, impaired UE functional use, postural dysfunction, and pain.   ACTIVITY LIMITATIONS: sleeping, bed mobility, reach over head, and hygiene/grooming  PARTICIPATION LIMITATIONS: cleaning, laundry, and shopping  PERSONAL FACTORS: 1-2 comorbidities: Breast Cancer with 0/7 LN's, radiation  are also affecting patient's functional outcome.   REHAB POTENTIAL: Good  CLINICAL DECISION MAKING: Stable/uncomplicated  EVALUATION COMPLEXITY: Low  GOALS: Goals reviewed with patient? Yes  SHORT TERM GOALS =LONG TERM GOALS: Target date: 03/23/2021  Pt will be compliant  with use of compression bra/sleeve prn to decrease edema Baseline: Goal status: INITIAL  2.  Pt will note decreased UE/breast pain by  greater than 50% Baseline:  Goal status: MET 03/11/2022  3.  Pt will be independent with MLD to reduce swelling and demonstrate independence in self care Baseline:  Goal status: INITIAL  4.  Pt will restore right shoulder ROM to WNL Baseline:  Goal status:MET 03/11/2022  PLAN:  PT FREQUENCY: 2x/week  PT DURATION: 4 weeks  PLANNED INTERVENTIONS: Therapeutic exercises, Therapeutic activity, Patient/Family education, Self Care, Orthotic/Fit training, Manual lymph drainage, scar mobilization, Vasopneumatic device, Manual therapy, and Re-evaluation  PLAN FOR NEXT SESSION: DC next visit?,review self MLD to breast upper arm,did she order new sleeve consider gauntlet for flying, , pec wall stretch, MFR to cording, Educated in MLD to UE for home use. Update HEP, start strength   Claris Pong, PT 03/11/2022, 11:58 AM

## 2022-03-16 ENCOUNTER — Ambulatory Visit: Payer: 59

## 2022-03-16 DIAGNOSIS — I890 Lymphedema, not elsewhere classified: Secondary | ICD-10-CM | POA: Diagnosis not present

## 2022-03-16 DIAGNOSIS — R293 Abnormal posture: Secondary | ICD-10-CM

## 2022-03-16 DIAGNOSIS — M25611 Stiffness of right shoulder, not elsewhere classified: Secondary | ICD-10-CM

## 2022-03-16 DIAGNOSIS — Z853 Personal history of malignant neoplasm of breast: Secondary | ICD-10-CM

## 2022-03-16 NOTE — Patient Instructions (Signed)
Over Head Pull: Narrow and Wide Grip   Cancer Rehab [phone] ° ° °On back, knees bent, feet flat, band across thighs, elbows straight but relaxed. Pull hands apart (start). Keeping elbows straight, bring arms up and over head, hands toward floor. Keep pull steady on band. Hold momentarily. Return slowly, keeping pull steady, back to start. Then do same with a wider grip on the band (past shoulder width) °Repeat _5-10__ times. Band color __yellow____  ° °Side Pull: Double Arm ° ° °On back, knees bent, feet flat. Arms perpendicular to body, shoulder level, elbows straight but relaxed. Pull arms out to sides, elbows straight. Resistance band comes across collarbones, hands toward floor. Hold momentarily. Slowly return to starting position. Repeat _5-10__ times. Band color _yellow____  ° °Sword ° ° °On back, knees bent, feet flat, left hand on left hip, right hand above left. Pull right arm DIAGONALLY (hip to shoulder) across chest. Bring right arm along head toward floor. Hold momentarily. Slowly return to starting position. °Repeat _5-10__ times. Do with left arm. Band color _yellow_____  ° °Shoulder Rotation: Double Arm ° ° °On back, knees bent, feet flat, elbows tucked at sides, bent 90°, hands palms up. Pull hands apart and down toward floor, keeping elbows near sides. Hold momentarily. Slowly return to starting position. °Repeat _5-10__ times. Band color __yellow____  ° ° °

## 2022-03-16 NOTE — Therapy (Signed)
OUTPATIENT PHYSICAL THERAPY ONCOLOGY TREATMENT  Patient Name: Yolanda [name] MRN: [id number] DOB:[birth year], 62 y.o., female Today's Date: 03/16/2022  END OF SESSION:  PT End of Session - 03/16/22 1103     Visit Number 7    Number of Visits 8    Date for PT Re-Evaluation 03/23/22    PT Start Time 1104    PT Stop Time 1200    PT Time Calculation (min) 56 min    Activity Tolerance Patient tolerated treatment well    Behavior During Therapy [facility] for tasks assessed/performed             Past Medical History:  Diagnosis Date   Anemia May 2022   Chemotherapy   Asthma    with respiratory infection   Cancer [facility])    breast cancer - right   Cataract March 2018   Optometrist visit   Diabetes mellitus without complication ([place])    type II   Fatty liver    Fibroid uterus    GERD (gastroesophageal reflux disease) June 2021   Hypertension    Patient reports that she has never been diagnosied with HTN, "I take HCTZ for swelling in my feet, if needed."   Osteoarthritis 03/10/2015   PONV (postoperative nausea and vomiting)    2014   Refusal of blood transfusions as patient is Jehovah's Witness    NO BLOOD PRODUCTS   Sleep apnea    Past Surgical History:  Procedure Laterality Date   BREAST CYST ASPIRATION     BREAST EXCISIONAL BIOPSY     BREAST LUMPECTOMY WITH SENTINEL LYMPH NODE BIOPSY Right 05/21/2020   Procedure: RIGHT BREAST LUMPECTOMY WITH SENTINEL LYMPH NODE BIOPSY;  Surgeon: Stark Klein, MD;  Location: [place];  Service: General;  Laterality: Right;   CHOLECYSTECTOMY  02/2009   COLONOSCOPY     DILATATION & CURRETTAGE/HYSTEROSCOPY WITH RESECTOCOPE N/A 03/13/2014   Procedure: DILATATION & CURETTAGE/HYSTEROSCOPY ;  Surgeon: Lyman Speller, MD;  Location: [facility];  Service: Gynecology;  Laterality: N/A;   PORTA CATH REMOVAL  01/2021   PORTACATH PLACEMENT Left 05/21/2020   Procedure: INSERTION PORT-A-CATH;  Surgeon: Stark Klein, MD;  Location: [place];  Service:  General;  Laterality: Left;   RADIOACTIVE SEED GUIDED EXCISIONAL BREAST BIOPSY Left 12/30/2016   Procedure: RADIOACTIVE SEED GUIDED EXCISIONAL BREAST BIOPSY;  Surgeon: Stark Klein, MD;  Location: [place];  Service: General;  Laterality: Left;   Patient Active Problem List   Diagnosis Date Noted   Lymphedema of right arm 01/05/2022   Right ankle pain 05/12/2021   Bilateral hand pain 05/12/2021   Preventative health care 07/16/2020   Burning sensation of eye 07/16/2020   Port-A-Cath in place 06/18/2020   Malignant neoplasm of upper-outer quadrant of right breast in female, estrogen receptor positive ([place]) 04/18/2020   Hypercalcemia 04/10/2020   Diverticulosis of sigmoid colon 11/15/2019   Paresthesia and pain of right extremity 02/05/2019   Fatty liver 01/31/2018   Atypical lobular hyperplasia [facility]) of left breast 02/01/2017   Fibroadenoma 01/22/2017   Hyperlipidemia 06/12/2015   Diabetes mellitus type 2 in obese ([place]) 02/23/2014   Elevated alkaline phosphatase level 02/23/2014   Uterine fibroid 02/23/2014   OBESITY 11/26/2009   OBSTRUCTIVE SLEEP APNEA 06/01/2008   Essential hypertension 06/01/2008    PCP: Penni Homans, MD  REFERRING PROVIDER: Hale Bogus, MD  REFERRING DIAG: Right Breast Cancer/Lymphedema  THERAPY DIAG:  Lymphedema of breast  Abnormal posture  History of right breast cancer  Stiffness of  right shoulder, not elsewhere classified  ONSET DATE: 12/30/2021  Rationale for Evaluation and Treatment: Rehabilitation  SUBJECTIVE:                                                                                                                                                                                           SUBJECTIVE STATEMENT Things are going well. Still have not had pain for about a week or 2. I feel like I am doing well with MLD, and imy breast is more normal looking. I wore the sleeve this am for about an hour and it felt OK. I  have an appt on Feb 1 to be fit for a compression bra at [facility].  PERTINENT HISTORY:  Pt had a right lumpectomy with SLNB on 05/21/2020 .There were 7 LN's removed and none were malignant. She had a hematoma requiring multiple aspirations. 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 She had chemotherapy ending on 11/13/2020 and radiation ending 03/27/2021. Several providers had noted breast changes .   PAIN:  Are you having pain? Yes NPRS scale: 0/10 breast but tender to touch., 1/10 axilla Pain location: Right breast, axilla at times Pain orientation: Right  PAIN TYPE: aching and tight, occasionally sharp, tender in breast Pain description: intermittent  Aggravating factors: in bed, reaching, Relieving factors: not sure  PRECAUTIONS: Other: Right UE/breast lymphedema  WEIGHT BEARING RESTRICTIONS: No  FALLS:  Has patient fallen in last 6 months? No  LIVING ENVIRONMENT: Lives with: lives with their spouse Lives in: House/apartment Stairs: Yes; Internal: 17-20 steps; on left going up and External: 3 steps; none Has following equipment at home: None  OCCUPATION: retired,  LEISURE: sit outside, read  HAND DOMINANCE: right   PRIOR LEVEL OF FUNCTION: Independent  PATIENT GOALS: Learn how to treat herself at home and make it better   OBJECTIVE:  COGNITION: Overall cognitive status: Impaired Per pt report  PALPATION: Palpable cord right axillary region that runs into right lateral breast  OBSERVATIONS / OTHER ASSESSMENTS: Right breast with noted skin thickening from radiation, but no enlarged pores. Indentation noted at breast incision. Shortening of breast tissue noted from radiation. Right arm slightly larger, but no pitting edema and hard to tell if just hand dominance  SENSATION: Light touch: Appears intact POSTURE: forward head, rounded shoulders  UPPER EXTREMITY AROM/PROM:  A/PROM RIGHT   eval  RIGHT 03/02/2022 Right 03/11/2022  Shoulder extension 55    Shoulder flexion  127 156 162  Shoulder abduction 131 155 170  Shoulder internal rotation 53  74  Shoulder external rotation 103  109    (Blank rows = not  tested)  A/PROM LEFT   eval  Shoulder extension 50  Shoulder flexion 150  Shoulder abduction 163  Shoulder internal rotation 62  Shoulder external rotation 96    (Blank rows = not tested)  CERVICAL AROM: All within functional limits:    UPPER EXTREMITY STRENGTH: WFL   LYMPHEDEMA ASSESSMENTS:   SURGERY TYPE/DATE:Right lumpectomy with SLNB  NUMBER OF LYMPH NODES REMOVED: 0/7  CHEMOTHERAPY: yes, ended 11/13/2020  RADIATION:Yes, ended 03/27/2021  HORMONE TREATMENT: Yes, anastrozole  INFECTIONS: NO  LYMPHEDEMA ASSESSMENTS:   LANDMARK RIGHT  eval RIGHT 03/16/2022  10 cm proximal to olecranon process 27.7 27.2  Olecranon process 24.5 24.2  10 cm proximal to ulnar styloid process 21.0 20.4  Just proximal to ulnar styloid process 14.9 14.9  Across hand at thumb web space 18.0 17.7  At base of 2nd digit 5.4 5.1  (Blank rows = not tested)  LANDMARK LEFT  eval LEFT 03/16/2022  10 cm proximal to olecranon process 27.0 26.1  Olecranon process 23.6 23.2  10 cm proximal to ulnar styloid process 19.3 19.5  Just proximal to ulnar styloid process 14.35 14.1  Across hand at thumb web space 17.1 17.0  At base of 2nd digit 5.3 5.2  (Blank rows = not tested)     LLIS  18%  QUICK DASH SURVEY: NA   TODAY'S TREATMENT:    03/16/2022 Supine scapular series x 5 ea with yellow Pt performed all steps of right breast MLD with occaisonal instruction required by PT.Short neck, 5 diaphragmatic breaths, L axillary nodes and establishment of interaxillary pathway, R inguinal nodes and establishment of axilloinguinal pathway, then R breast moving fluid towards pathways then retracing all steps. Reviewed scar massage and checking areas of fibrosis first before doing MLD Updated HEP  Measured crcumferences  03/11/2022  Practiced donning/doffing class  1 Size 2 Medi [employer] sleeve and gauntlet. Showed pt how to use rubber gloves to don Measured right shoulder ROM Pt performed all steps of right breast MLD with education by PT prn for direction, sequence and stretch.Short neck, 5 diaphragmatic breaths, L axillary nodes and establishment of interaxillary pathway, R inguinal nodes and establishment of axilloinguinal pathway, then R breast moving fluid towards pathways then retracing all steps.   MFR to right axillary region and upper arm/elbow region to decrease cording PROM right UE with pinning for flexion, abd, and D2 flexion 03/09/2022 Overhead pulleys x 2 min flexion and 1 min. Abd Ball rolls on wall x 5 forward and 5 abduction MFR to right axillary region and upper arm/elbow region to decrease cording PROM right UE with pinning for flexion, abd, and D2 flexion. Pt performed all steps of right breast MLD with education by PT prn for direction, sequence and stretch.Short neck, 5 diaphragmatic breaths, L axillary nodes and establishment of interaxillary pathway, R inguinal nodes and establishment of axilloinguinal pathway, then R breast moving fluid towards pathways spending extra time in any areas of fibrosis then retracing all steps.  DATE:   03/04/2022 Discussed compression sleeves and pt will go with the Medi sleeve and gauntlet Class 1. MFR to right axillary region and upper arm/elbow region to decrease cording PROM right UE with pinning for flexion, abd, and D2 flexion. Supine bilateral shoulder flexion, scaption, horizontal abduction x 5 In supine: Short neck, 5 diaphragmatic breaths, L axillary nodes and establishment of interaxillary pathway, R inguinal nodes and establishment of axilloinguinal pathway, then R breast moving fluid towards pathways spending extra time in any areas of fibrosis then retracing all  steps. Pt being educated in amt of stretch, pressure, and practicing each step several times Pt given written instructions for breast MLD   03/02/2022  Overhead pulleys x 2 min flexion, 1 min scaption, 2 min abduction MFR to right axillary region and upper arm/elbow region to decrease cording PROM right UE with pinning for flexion, abd, and D2 flexion. Supine bilateral shoulder flexion, scaption, horizontal abduction x 5 In supine: Short neck, 5 diaphragmatic breaths, L axillary nodes and establishment of interaxillary pathway, R inguinal nodes and establishment of axilloinguinal pathway, then R breast moving fluid towards pathways spending extra time in any areas of fibrosis then retracing all steps. Pt being educated in amt of stretch, pressure, reasoning etc.   02/25/2022 Overhead pulleys x 2 min flexion, 1 min scaption, 2 min abduction Discussed compression bras and gave pt script and info for [facility] to see if she has coverage Checked compression sleeve. Fits fairly well except a little short at wrist and large at wrist. Measured pt and gave her measurements so she can order at home. MFR to right axillary region and upper arm/elbow region to decrease cording PROM right UE with pinning for flexion, abd, and D2 flexion. Supine wand flexion and scaption x 5 ea. Showed NTS   02/23/2022  PATIENT EDUCATION:  Education details: 4 post op exercises, cording, Performed exercises x 4-5 reps ea Person educated: Patient Education method: Explanation and Handouts Education comprehension: verbalized understanding and returned demonstration  HOME EXERCISE PROGRAM: 4 post op exercises.  ASSESSMENT:  CLINICAL IMPRESSION: Pt has achieved 3 out of 4 goals established. She is independent with MLD, she has restored shoulder ROM to WNL, Her pain is 50% better or more overall. She did not achieve goal for compression bra but she does have an appt to be fit on Thursday. She feels ready to be  released to independent management.hepatitis was updated today with theraband exercises. OBJECTIVE IMPAIRMENTS: decreased knowledge of condition, decreased ROM, increased fascial restrictions, impaired UE functional use, postural dysfunction, and pain.   ACTIVITY LIMITATIONS: sleeping, bed mobility, reach over head, and hygiene/grooming  PARTICIPATION LIMITATIONS: cleaning, laundry, and shopping  PERSONAL FACTORS: 1-2 comorbidities: Breast Cancer with 0/7 LN's, radiation  are also affecting patient's functional outcome.   REHAB POTENTIAL: Good  CLINICAL DECISION MAKING: Stable/uncomplicated  EVALUATION COMPLEXITY: Low  GOALS: Goals reviewed with patient? Yes  SHORT TERM GOALS =LONG TERM GOALS: Target date: 03/23/2021  Pt will be compliant  with use of compression bra/sleeve prn to decrease edema Baseline: Goal status: not met, is being fit for compression bra on Thursday.Does have her sleeve and gauntlet 2.  Pt will note decreased UE/breast pain by greater than 50% Baseline:  Goal status: MET 03/11/2022  3.  Pt will be independent with MLD to reduce swelling and demonstrate independence in self care Baseline:  Goal status: MET  03/16/2022  4.  Pt will restore right shoulder ROM to WNL Baseline:  Goal status:MET  03/11/2022  PLAN:  PT FREQUENCY: 2x/week  PT DURATION: 4 weeks  PLANNED INTERVENTIONS: Therapeutic exercises, Therapeutic activity, Patient/Family education, Self Care, Orthotic/Fit training, Manual lymph drainage, scar mobilization, Vasopneumatic device, Manual therapy, and Re-evaluation  PLAN FOR NEXT SESSION: pt is discharged to continue with independent self management PHYSICAL THERAPY DISCHARGE SUMMARY  Visits from Start of Care: 7  Current functional level related to goals / functional outcomes: Achieved 3/4 goals. Has not yet met compression bra goal but is being fit on Thursday   Remaining deficits: Still awaiting compression bra, but does have a sleeve  and gauntet, mild breast swelling/fibrosis which improves with MLD   Education / Equipment: HEP, theraband, compression sleeve/gauntlet, getting a compression bra   Patient agrees to discharge. Patient goals were partially met. Patient is being discharged due to being pleased with the current functional level.  Claris Pong, PT 03/16/2022, 12:59 PM

## 2022-04-17 ENCOUNTER — Other Ambulatory Visit: Payer: Self-pay | Admitting: Internal Medicine

## 2022-04-24 ENCOUNTER — Ambulatory Visit (INDEPENDENT_AMBULATORY_CARE_PROVIDER_SITE_OTHER): Payer: 59 | Admitting: Internal Medicine

## 2022-04-24 ENCOUNTER — Encounter: Payer: Self-pay | Admitting: Internal Medicine

## 2022-04-24 VITALS — BP 136/80 | HR 81 | Ht 63.0 in | Wt 174.0 lb

## 2022-04-24 DIAGNOSIS — K7581 Nonalcoholic steatohepatitis (NASH): Secondary | ICD-10-CM | POA: Diagnosis not present

## 2022-04-24 DIAGNOSIS — E119 Type 2 diabetes mellitus without complications: Secondary | ICD-10-CM | POA: Diagnosis not present

## 2022-04-24 DIAGNOSIS — K760 Fatty (change of) liver, not elsewhere classified: Secondary | ICD-10-CM | POA: Insufficient documentation

## 2022-04-24 LAB — POCT GLYCOSYLATED HEMOGLOBIN (HGB A1C): Hemoglobin A1C: 6.2 % — AB (ref 4.0–5.6)

## 2022-04-24 MED ORDER — EMPAGLIFLOZIN 10 MG PO TABS: 10.0000 mg | ORAL_TABLET | Freq: Every day | ORAL | 3 refills | Status: DC

## 2022-04-24 MED ORDER — METFORMIN HCL ER 500 MG PO TB24: 1000.0000 mg | ORAL_TABLET | Freq: Two times a day (BID) | ORAL | 3 refills | Status: DC

## 2022-04-24 NOTE — Patient Instructions (Signed)
-   Restart  Jardiance 10 mg , 1 tablet in the morning  - Continue Metformin 500 mg, 2 tablets with Breakfast and 2 tablet with Supper    HOW TO TREAT LOW BLOOD SUGARS (Blood sugar LESS THAN 70 MG/DL) Please follow the RULE OF 15 for the treatment of hypoglycemia treatment (when your (blood sugars are less than 70 mg/dL)   STEP 1: Take 15 grams of carbohydrates when your blood sugar is low, which includes:  3-4 GLUCOSE TABS  OR 3-4 OZ OF JUICE OR REGULAR SODA OR ONE TUBE OF GLUCOSE GEL    STEP 2: RECHECK blood sugar in 15 MINUTES STEP 3: If your blood sugar is still low at the 15 minute recheck --> then, go back to STEP 1 and treat AGAIN with another 15 grams of carbohydrates.

## 2022-04-24 NOTE — Progress Notes (Signed)
Name: Yolanda [name]  MRN/ DOB: [id number], 1960/08/15   Age/ Sex: 62 y.o., female    PCP: Mosie Lukes, MD   Reason for Endocrinology Evaluation: Type 2 Diabetes Mellitus     Date of Initial Endocrinology Visit: 08/20/2020    PATIENT IDENTIFIER: [name] is a 62 y.o. female with a past medical history of T2DM, Asthma, Breast ca and hx of Fatty liver. The patient presented for initial endocrinology clinic visit on 08/20/2020 for consultative assistance with her diabetes management.    Pt on adjuvant therapy for right Breast ca started 06/2020 ( cyclophosphamide, doxorubicin with pegfilgrastim on day 3 )but  she started weekly paclitaxel 08/21/2020.   DIABETIC HISTORY:  Yolanda [name] was diagnosed with DM in 2015. She has been on metformin only since her diagnosis.  Her hemoglobin A1c has ranged from 6.9% in 2019, peaking at 7.5% in 2020.   On her initial visit to our clinic she had an A1c of 7.4%, she was on metformin only, she has noted hyperglycemia during chemotherapy, we started Jardiance at the time.   SUBJECTIVE:   During the last visit (05/09/2021): A1c 7.6%    Today (12/24/2020): Yolanda [name] is here for follow-up on diabetes management. She has NOT been to our clinic in 12 months.     Since her last visit here she has been diagnosed with NASH  based on a liver biopsy 02/2022 and weight loss was recommended  She has been noted with weight loss since December , 2023  she checks her blood sugars 1 times daily. The patient has not had hypoglycemic episodes since the last clinic visit.  She did not continue on Jardiance as she did not see any change in her glucose   She continues to follow up with oncology for breast CA,completed  chemo 11/13/2020.  Completed radiation 03/2021. started anastrozole 03/2021   HOME DIABETES REGIMEN: Metformin 500 mg XR 2 tabs BID  Jardiance 25 mg daily  Statin: yes ACE-I/ARB: no- Allergic to lisinopril  Prior Diabetic Education:  no   METER DOWNLOAD SUMMARY:  112-141 mg/dL    DIABETIC COMPLICATIONS: Microvascular complications:   Denies: CKD, neuropathy, retinopathy  Last eye exam: Completed 09/16/2020  Macrovascular complications:   Denies: CAD, PVD, CVA   PAST HISTORY: Past Medical History:  Past Medical History:  Diagnosis Date   Anemia May 2022   Chemotherapy   Asthma    with respiratory infection   Cancer ([place])    breast cancer - right   Cataract March 2018   Optometrist visit   Diabetes mellitus without complication ([place])    type II   Fatty liver    Fibroid uterus    GERD (gastroesophageal reflux disease) June 2021   Hypertension    Patient reports that she has never been diagnosied with HTN, "I take HCTZ for swelling in my feet, if needed."   Osteoarthritis 03/10/2015   PONV (postoperative nausea and vomiting)    2014   Refusal of blood transfusions as patient is Jehovah's Witness    NO BLOOD PRODUCTS   Sleep apnea    Past Surgical History:  Past Surgical History:  Procedure Laterality Date   BREAST CYST ASPIRATION     BREAST EXCISIONAL BIOPSY     BREAST LUMPECTOMY WITH SENTINEL LYMPH NODE BIOPSY Right 05/21/2020   Procedure: RIGHT BREAST LUMPECTOMY WITH SENTINEL LYMPH NODE BIOPSY;  Surgeon: Stark Klein, MD;  Location: [facility];  Service: General;  Laterality: Right;   CHOLECYSTECTOMY  02/2009   COLONOSCOPY     DILATATION & CURRETTAGE/HYSTEROSCOPY WITH RESECTOCOPE N/A 03/13/2014   Procedure: DILATATION & CURETTAGE/HYSTEROSCOPY ;  Surgeon: Lyman Speller, MD;  Location: [facility];  Service: Gynecology;  Laterality: N/A;   PORTA CATH REMOVAL  01/2021   PORTACATH PLACEMENT Left 05/21/2020   Procedure: INSERTION PORT-A-CATH;  Surgeon: Stark Klein, MD;  Location: [facility];  Service: General;  Laterality: Left;   RADIOACTIVE SEED GUIDED EXCISIONAL BREAST BIOPSY Left 12/30/2016   Procedure: RADIOACTIVE SEED GUIDED EXCISIONAL BREAST BIOPSY;  Surgeon: Stark Klein, MD;  Location: [facility];  Service: General;  Laterality: Left;    Social History:  reports that she has never smoked. She has never been exposed to tobacco smoke. She has never used smokeless tobacco. She reports that she does not drink alcohol and does not use drugs. Family History:  Family History  Problem Relation Age of Onset   Asthma Mother    Diabetes Mother    Diabetes Father    Heart attack Father    Heart disease Father    Heart Problems Sister    Asthma Sister    Hypertension Sister    Diabetes Brother    Diabetes Brother    Asthma Brother    Heart disease Maternal Grandfather      HOME MEDICATIONS: Allergies as of 04/24/2022       Reactions   Lisinopril Shortness Of Breath, Cough   wheezing        Medication List        Accurate as of April 24, 2022  1:51 PM. If you have any questions, ask your nurse or doctor.          acetaminophen 500 MG tablet Commonly known as: TYLENOL Take 500 mg by mouth every 6 (six) hours as needed for moderate pain.   albuterol 108 (90 Base) MCG/ACT inhaler Commonly known as: VENTOLIN HFA Inhale 1-2 puffs into the lungs every 4 (four) hours as needed for wheezing or shortness of breath.   anastrozole 1 MG tablet Commonly known as: ARIMIDEX Take 1 tablet (1 mg total) by mouth daily.   BLINK TEARS OP Place 1 drop into both eyes daily as needed (dry eyes).   CALCIUM-MAGNESIUM PO Take 30 mLs by mouth once a week.   Cholecalciferol 125 MCG (5000 UT) capsule Take 5,000 Units by mouth daily.   empagliflozin 25 MG Tabs tablet Commonly known as: Jardiance Take 1 tablet (25 mg total) by mouth daily before breakfast.   loratadine 10 MG tablet Commonly known as: CLARITIN TAKE 1 TABLET(10 MG) BY MOUTH DAILY   metFORMIN 500 MG 24 hr tablet Commonly known as: GLUCOPHAGE-XR TAKE 2 TABLETS(1000 MG) BY MOUTH IN THE MORNING AND AT BEDTIME   multivitamin Liqd Take 30 mLs by mouth every other day.   Olopatadine HCl 0.6 %  Soln Place 2 sprays into both nostrils 2 (two) times daily as needed (allergies).   omeprazole 40 MG capsule Commonly known as: PRILOSEC Take 1 capsule (40 mg total) by mouth daily.   OneTouch Verio test strip Generic drug: glucose blood CHECK BLOOD GLUCOSE DAILY   triamcinolone ointment 0.5 % Commonly known as: KENALOG Apply 1 Application topically 2 (two) times daily.   Vitamin K2 100 MCG Caps Take 100 mcg by mouth daily.         ALLERGIES: Allergies  Allergen Reactions   Lisinopril Shortness Of Breath and Cough    wheezing     REVIEW OF SYSTEMS:  A comprehensive ROS was conducted with the patient and is negative except as per HPI     OBJECTIVE:   VITAL SIGNS: BP 136/80 (BP Location: Left Arm, Patient Position: Sitting, Cuff Size: Large)   Pulse 81   Ht '5\' 3"'$  (1.6 m)   Wt 174 lb (78.9 kg)   LMP 02/21/2014   SpO2 96%   BMI 30.82 kg/m    PHYSICAL EXAM:  General: Pt appears well and is in NAD  Lungs: Clear with good BS bilat   Heart: RRR   Abdomen: soft, nontender  Extremities:  Lower extremities - No pretibial edema.  Neuro: MS is good with appropriate affect, pt is alert and Ox3    DM foot exam: 04/24/2022  The skin of the feet is intact without sores or ulcerations. The pedal pulses are 2+ on right and 2+ on left. The sensation is intact to a screening 5.07, 10 gram monofilament bilaterally   DATA REVIEWED:  Lab Results  Component Value Date   HGBA1C 6.2 (A) 04/24/2022   HGBA1C 6.9 (H) 01/05/2022   HGBA1C 7.3 (H) 09/18/2021   Lab Results  Component Value Date   MICROALBUR <0.7 10/17/2020   LDLCALC 106 (H) 01/05/2022   CREATININE 0.62 01/05/2022   Lab Results  Component Value Date   MICRALBCREAT 2.8 10/17/2020    Lab Results  Component Value Date   CHOL 199 01/05/2022   HDL 64.60 01/05/2022   LDLCALC 106 (H) 01/05/2022   LDLDIRECT 101.0 10/17/2020   TRIG 145.0 01/05/2022   CHOLHDL 3 01/05/2022        Latest Reference Range &  Units 01/05/22 09:26  COMPREHENSIVE METABOLIC PANEL  Rpt !  Sodium 135 - 145 mEq/L 139  Potassium 3.5 - 5.1 mEq/L 4.4  Chloride 96 - 112 mEq/L 102  CO2 19 - 32 mEq/L 28  Glucose 70 - 99 mg/dL 100 (H)  BUN 6 - 23 mg/dL 13  Creatinine 0.40 - 1.20 mg/dL 0.62  Calcium 8.4 - 10.5 mg/dL 10.4  Alkaline Phosphatase 39 - 117 U/L 141 (H)  Albumin 3.5 - 5.2 g/dL 4.2  AST 0 - 37 U/L 20  ALT 0 - 35 U/L 20  Total Protein 6.0 - 8.3 g/dL 6.7  Total Bilirubin 0.2 - 1.2 mg/dL 0.3  GFR >60.00 mL/min 95.27   Old records , labs and images have been reviewed.    ASSESSMENT / PLAN / RECOMMENDATIONS:   1) Type 2 Diabetes Mellitus, Optimally controlled, Without complications - Most recent A1c of 6.2 %. Goal A1c < 7.0 %.    -Patient continues with optimal glucose control with just metformin, she has not been here in a year and has not been on Jardiance as per patient she did not notice a difference in her glucose control -We briefly discussed CGM as her husband is on it, since she is not on insulin and does not require frequent monitoring we have opted to hold off on this -I did encourage the patient to consider Jardiance due to the cardiovascular and renal benefits as well as weight loss benefits   MEDICATIONS: Restart Jardiance 10 mg , 1 tablet in the morning  Continue Metformin 500 mg, 2 tablets with Breakfast and 2 tablet with Supper    EDUCATION / INSTRUCTIONS: BG monitoring instructions: Patient is instructed to check her blood sugars 1 times a day, fasting . Call [facility] if: BG persistently < 70  I reviewed the Rule of 15 for the treatment of hypoglycemia  in detail with the patient. Literature supplied.   2) Diabetic complications:  Eye: Does not have known diabetic retinopathy.  Neuro/ Feet: Does not have known diabetic peripheral neuropathy. Renal: Patient does not have known baseline CKD. She is not on an ACEI/ARB at present.  3) NASH:   - S/P liver bx 01/2022   - Follows with Dr. Collene Mares - She was advised to lose weight  - We discussed Glp-1 a vs SGLT-2i, GLP-1 agonist did not appeal to her as she understands she will need to be on them long-term because if she will discontinue her appetite will return in the weight gain will recur -I have strongly encouraged her to follow a certain diet such as Noom or weight watchers -She already exercises an average of 240 minutes a week, which I have encouraged her to continue   F/U in 6 months     Signed electronically by: Mack Guise, MD  [facility]  [address] [place], [place] [address] Phone: [phone] FAX: [phone]   CC: Mosie Lukes, MD [address] [place] [address] Phone: [phone]  Fax: [phone]    Return to Endocrinology clinic as below: Future Appointments  Date Time Provider [place]  04/30/2022  9:30 AM Benay Pike, MD [facility] None  05/18/2022  9:40 AM Mosie Lukes, MD [facility]  12/29/2022  9:40 AM Bo Merino, MD [facility] None  02/01/2023  8:15 AM Megan Salon, MD [facility]

## 2022-04-27 ENCOUNTER — Telehealth: Payer: 59 | Admitting: Nurse Practitioner

## 2022-04-27 DIAGNOSIS — J069 Acute upper respiratory infection, unspecified: Secondary | ICD-10-CM | POA: Diagnosis not present

## 2022-04-27 DIAGNOSIS — J309 Allergic rhinitis, unspecified: Secondary | ICD-10-CM

## 2022-04-27 MED ORDER — ALBUTEROL SULFATE HFA 108 (90 BASE) MCG/ACT IN AERS: 2.0000 | INHALATION_SPRAY | Freq: Four times a day (QID) | RESPIRATORY_TRACT | 0 refills | Status: DC | PRN

## 2022-04-27 MED ORDER — LORATADINE 10 MG PO TABS: 10.0000 mg | ORAL_TABLET | Freq: Every day | ORAL | 11 refills | Status: DC

## 2022-04-27 MED ORDER — BENZONATATE 100 MG PO CAPS: 100.0000 mg | ORAL_CAPSULE | Freq: Three times a day (TID) | ORAL | 0 refills | Status: DC | PRN

## 2022-04-27 NOTE — Progress Notes (Signed)
Virtual Visit Consent   Yolanda [name], you are scheduled for a virtual visit with a [facility] provider today. Just as with appointments in the office, your consent must be obtained to participate. Your consent will be active for this visit and any virtual visit you may have with one of our providers in the next 365 days. If you have a MyChart account, a copy of this consent can be sent to you electronically.  As this is a virtual visit, video technology does not allow for your provider to perform a traditional examination. This may limit your provider's ability to fully assess your condition. If your provider identifies any concerns that need to be evaluated in person or the need to arrange testing (such as labs, EKG, etc.), we will make arrangements to do so. Although advances in technology are sophisticated, we cannot ensure that it will always work on either your end or our end. If the connection with a video visit is poor, the visit may have to be switched to a telephone visit. With either a video or telephone visit, we are not always able to ensure that we have a secure connection.  By engaging in this virtual visit, you consent to the provision of healthcare and authorize for your insurance to be billed (if applicable) for the services provided during this visit. Depending on your insurance coverage, you may receive a charge related to this service.  I need to obtain your verbal consent now. Are you willing to proceed with your visit today? Yolanda [name] has provided verbal consent on 04/27/2022 for a virtual visit (video or telephone). Apolonio Schneiders, FNP  Date: 04/27/2022 3:12 PM  Virtual Visit via Video Note   I, Apolonio Schneiders, connected with  Yolanda [name]  ([id number], 03/24/1960) on 04/27/22 at  3:15 PM EDT by a video-enabled telemedicine application and verified that I am speaking with the correct person using two identifiers.  Location: Patient: Virtual Visit Location Patient:  Home Provider: Virtual Visit Location Provider: Home Office   I discussed the limitations of evaluation and management by telemedicine and the availability of in person appointments. The patient expressed understanding and agreed to proceed.    History of Present Illness: Yolanda [name] is a 62 y.o. who identifies as a female who was assigned female at birth, and is being seen today for nasal congestion and dry cough.  Symptom onset was 04/24/22  She had a low grade fever one day 99.8 that has resolved   She was unable to take a home COVID test   Today compared to Friday she is feeling better   She does have an allergist and saw them recently and was told she is allergic to every tree and grass in [place]  She is not currently on any allergy medications She has been using Delsym   She has had an inhaler in the past but is not currently using one   Problems:  Patient Active Problem List   Diagnosis Date Noted   Type 2 diabetes mellitus without complication, without long-term current use of insulin ([place]) 04/24/2022   NASH (nonalcoholic steatohepatitis) 04/24/2022   Lymphedema of right arm 01/05/2022   Right ankle pain 05/12/2021   Bilateral hand pain 05/12/2021   Preventative health care 07/16/2020   Burning sensation of eye 07/16/2020   Port-A-Cath in place 06/18/2020   Malignant neoplasm of upper-outer quadrant of right breast in female, estrogen receptor positive ([place]) 04/18/2020   Hypercalcemia 04/10/2020  Diverticulosis of sigmoid colon 11/15/2019   Paresthesia and pain of right extremity 02/05/2019   Fatty liver 01/31/2018   Atypical lobular hyperplasia [facility]) of left breast 02/01/2017   Fibroadenoma 01/22/2017   Hyperlipidemia 06/12/2015   Diabetes mellitus type 2 in obese ([place]) 02/23/2014   Elevated alkaline phosphatase level 02/23/2014   Uterine fibroid 02/23/2014   OBESITY 11/26/2009   OBSTRUCTIVE SLEEP APNEA 06/01/2008   Essential hypertension 06/01/2008     Allergies:  Allergies  Allergen Reactions   Lisinopril Shortness Of Breath and Cough    wheezing   Medications:  Current Outpatient Medications:    acetaminophen (TYLENOL) 500 MG tablet, Take 500 mg by mouth every 6 (six) hours as needed for moderate pain., Disp: , Rfl:    albuterol (PROVENTIL HFA;VENTOLIN HFA) 108 (90 Base) MCG/ACT inhaler, Inhale 1-2 puffs into the lungs every 4 (four) hours as needed for wheezing or shortness of breath., Disp: 18 g, Rfl: 2   anastrozole (ARIMIDEX) 1 MG tablet, Take 1 tablet (1 mg total) by mouth daily., Disp: 30 tablet, Rfl: 4   CALCIUM-MAGNESIUM PO, Take 30 mLs by mouth once a week., Disp: , Rfl:    Cholecalciferol 125 MCG (5000 UT) capsule, Take 5,000 Units by mouth daily., Disp: , Rfl:    empagliflozin (JARDIANCE) 10 MG TABS tablet, Take 1 tablet (10 mg total) by mouth daily., Disp: 90 tablet, Rfl: 3   glucose blood (ONETOUCH VERIO) test strip, CHECK BLOOD GLUCOSE DAILY, Disp: 75 strip, Rfl: 0   loratadine (CLARITIN) 10 MG tablet, TAKE 1 TABLET(10 MG) BY MOUTH DAILY, Disp: 90 tablet, Rfl: 3   Menaquinone-7 (VITAMIN K2) 100 MCG CAPS, Take 100 mcg by mouth daily., Disp: , Rfl:    metFORMIN (GLUCOPHAGE-XR) 500 MG 24 hr tablet, Take 2 tablets (1,000 mg total) by mouth 2 (two) times daily with a meal., Disp: 360 tablet, Rfl: 3   Multiple Vitamin (MULTIVITAMIN) LIQD, Take 30 mLs by mouth every other day., Disp: , Rfl:    Olopatadine HCl 0.6 % SOLN, Place 2 sprays into both nostrils 2 (two) times daily as needed (allergies)., Disp: , Rfl:    omeprazole (PRILOSEC) 40 MG capsule, Take 1 capsule (40 mg total) by mouth daily., Disp: 30 capsule, Rfl: 2   Polyethylene Glycol 400 (BLINK TEARS OP), Place 1 drop into both eyes daily as needed (dry eyes)., Disp: , Rfl:    triamcinolone ointment (KENALOG) 0.5 %, Apply 1 Application topically 2 (two) times daily., Disp: 30 g, Rfl: 0 No current facility-administered medications for this visit.  Facility-Administered  Medications Ordered in Other Visits:    sodium chloride flush (NS) 0.9 % injection 10 mL, 10 mL, Intracatheter, PRN, Magrinat, Virgie Dad, MD  Observations/Objective: Patient is well-developed, well-nourished in no acute distress.  Resting comfortably  at home.  Head is normocephalic, atraumatic.  No labored breathing.  Speech is clear and coherent with logical content.  Patient is alert and oriented at baseline.    Assessment and Plan: 1. Viral upper respiratory tract infection  - benzonatate (TESSALON) 100 MG capsule; Take 1 capsule (100 mg total) by mouth 3 (three) times daily as needed.  Dispense: 30 capsule; Refill: 0  2. Allergic rhinitis, unspecified seasonality, unspecified trigger  - albuterol (VENTOLIN HFA) 108 (90 Base) MCG/ACT inhaler; Inhale 2 puffs into the lungs every 6 (six) hours as needed for wheezing or shortness of breath.  Dispense: 8 g; Refill: 0 - loratadine (CLARITIN) 10 MG tablet; Take 1 tablet (10 mg total) by  mouth daily.  Dispense: 30 tablet; Refill: 11    Continue nasal spray from allergist  Follow Up Instructions: I discussed the assessment and treatment plan with the patient. The patient was provided an opportunity to ask questions and all were answered. The patient agreed with the plan and demonstrated an understanding of the instructions.  A copy of instructions were sent to the patient via MyChart unless otherwise noted below.    The patient was advised to call back or seek an in-person evaluation if the symptoms worsen or if the condition fails to improve as anticipated.  Time:  I spent 10 minutes with the patient via telehealth technology discussing the above problems/concerns.    Apolonio Schneiders, FNP

## 2022-04-30 ENCOUNTER — Inpatient Hospital Stay: Payer: 59 | Attending: Hematology and Oncology | Admitting: Hematology and Oncology

## 2022-04-30 ENCOUNTER — Encounter: Payer: Self-pay | Admitting: Hematology and Oncology

## 2022-04-30 VITALS — BP 115/75 | HR 99 | Temp 97.8°F | Resp 14 | Ht 63.0 in | Wt 171.1 lb

## 2022-04-30 DIAGNOSIS — M8580 Other specified disorders of bone density and structure, unspecified site: Secondary | ICD-10-CM | POA: Insufficient documentation

## 2022-04-30 DIAGNOSIS — Z170 Estrogen receptor positive status [ER+]: Secondary | ICD-10-CM | POA: Diagnosis not present

## 2022-04-30 DIAGNOSIS — C50411 Malignant neoplasm of upper-outer quadrant of right female breast: Secondary | ICD-10-CM | POA: Insufficient documentation

## 2022-04-30 DIAGNOSIS — Z79811 Long term (current) use of aromatase inhibitors: Secondary | ICD-10-CM | POA: Insufficient documentation

## 2022-04-30 NOTE — Progress Notes (Signed)
Yolanda [name]  [phone] [phone]     ID: Yolanda [name] DOB: 08-03-1960  MR#: [id number]  [id number]  Patient Care Team: Mosie Lukes, MD as PCP - General (Family Medicine) Michel Bickers, MD as Consulting Physician (Infectious Diseases) Megan Salon, MD as Consulting Physician (Gynecology) Magrinat, Virgie Dad, MD (Inactive) as Consulting Physician (Oncology) Juanita Craver, MD as Consulting Physician (Gastroenterology) Stark Klein, MD as Consulting Physician (General Surgery) Mauro Kaufmann, RN as Oncology Nurse Navigator Rockwell Germany, RN as Oncology Nurse Navigator Amedeo Kinsman, OD (Optometry) Benay Pike, MD  CHIEF COMPLAINT: Estrogen receptor positive breast cancer  CURRENT TREATMENT: Anastrazole.   INTERVAL HISTORY:  Yolanda [name] returns today for follow up and treatment of her estrogen receptor positive breast cancer. Yolanda [name] is here by herself. She is now on anastrozole.  She has also been working with physical therapy for her lymphedema. In December 2023 she had ultrasound-guided biopsy of the liver which showed liver steatosis Last mammogram in July 2023, no evidence of malignancy, bilateral diagnostic mammogram recommended in 1 year. Rest of the pertinent 10 point ROS reviewed and negative  REVIEW OF SYSTEMS: A detailed review of systems was otherwise stable.   COVID 19 VACCINATION STATUS: fully vaccinated [employer]), with booster 12/2019   HISTORY OF CURRENT ILLNESS: From the original intake note:   I saw Yolanda [name] in December 2018 for evaluation of atypical lobular hyperplasia.  We discussed various strategies regarding breast cancer prevention at that time for her to consider.    More recently (around the time of the Super Bowl 2022) she found a lump in her right breast.  She underwent bilateral diagnostic mammography with tomography and right breast ultrasonography at [facility] on 04/10/2020  showing: breast density category C; palpable 3 cm mass in right breast at 12 o'clock; no enlarged adenopathy in right axilla.  Accordingly on the same day, she proceeded to biopsy of the right breast area in question. The pathology from this procedure [id number]) showed: invasive ductal carcinoma, grade 3; ductal carcinoma in situ. Prognostic indicators significant for: estrogen receptor, 90% positive with moderate staining intensity and progesterone receptor, 0% negative. Proliferation marker Ki67 at 25%. HER2 equivocal by immunohistochemistry (2+), but negative by fluorescent in situ hybridization with a signals ratio 1.49 and number per cell 2.75.   Cancer Staging  Malignant neoplasm of upper-outer quadrant of right breast in female, estrogen receptor positive ([place]) Staging form: Breast, AJCC 8th Edition - Clinical: Stage IIA (cT2, cN0, cM0, G3, ER+, PR+, HER2-) - Signed by Chauncey Cruel, MD on 04/18/2020   The patient's subsequent history is as detailed below.   PAST MEDICAL HISTORY: Past Medical History:  Diagnosis Date   Anemia May 2022   Chemotherapy   Asthma    with respiratory infection   Cancer [facility])    breast cancer - right   Cataract March 2018   Optometrist visit   Diabetes mellitus without complication ([place])    type II   Fatty liver    Fibroid uterus    GERD (gastroesophageal reflux disease) June 2021   Hypertension    Patient reports that she has never been diagnosied with HTN, "I take HCTZ for swelling in my feet, if needed."   Osteoarthritis 03/10/2015   PONV (postoperative nausea and vomiting)    2014   Refusal of blood transfusions as patient is Jehovah's Witness    NO BLOOD PRODUCTS   Sleep apnea  PAST SURGICAL HISTORY: Past Surgical History:  Procedure Laterality Date   BREAST CYST ASPIRATION     BREAST EXCISIONAL BIOPSY     BREAST LUMPECTOMY WITH SENTINEL LYMPH NODE BIOPSY Right 05/21/2020   Procedure: RIGHT BREAST LUMPECTOMY WITH SENTINEL  LYMPH NODE BIOPSY;  Surgeon: Stark Klein, MD;  Location: [place];  Service: General;  Laterality: Right;   CHOLECYSTECTOMY  02/2009   COLONOSCOPY     DILATATION & CURRETTAGE/HYSTEROSCOPY WITH RESECTOCOPE N/A 03/13/2014   Procedure: DILATATION & CURETTAGE/HYSTEROSCOPY ;  Surgeon: Lyman Speller, MD;  Location: [facility];  Service: Gynecology;  Laterality: N/A;   PORTA CATH REMOVAL  01/2021   PORTACATH PLACEMENT Left 05/21/2020   Procedure: INSERTION PORT-A-CATH;  Surgeon: Stark Klein, MD;  Location: [place];  Service: General;  Laterality: Left;   RADIOACTIVE SEED GUIDED EXCISIONAL BREAST BIOPSY Left 12/30/2016   Procedure: RADIOACTIVE SEED GUIDED EXCISIONAL BREAST BIOPSY;  Surgeon: Stark Klein, MD;  Location: [place];  Service: General;  Laterality: Left;    FAMILY HISTORY: Family History  Problem Relation Age of Onset   Asthma Mother    Diabetes Mother    Diabetes Father    Heart attack Father    Heart disease Father    Heart Problems Sister    Asthma Sister    Hypertension Sister    Diabetes Brother    Diabetes Brother    Asthma Brother    Heart disease Maternal Grandfather   The patient's father died from heart disease at the age of 69.  The patient's mother is 18 years old as of March 2022.  The patient has 4 brothers and 2 sisters.  She is not aware of any history of cancer in her family.   GYNECOLOGIC HISTORY:  Patient's last menstrual period was 02/21/2014. Menarche: 62 years old GX P 0 LMP age 36 Contraceptive 1 year, no complications HRT no  Hysterectomy? no BSO? 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  Her husband [name] works as a Marketing executive for [employer].  He has 2 children from a prior marriage, both living in [place].  One is traveling nurse.  The other 1 is a Administrator.  The patient is a Restaurant manager, fast food.    ADVANCED DIRECTIVES: In the absence of any documentation to the contrary, the  patient's spouse is their HCPOA.  The patient made it clear at her visit 04/18/2020 that she would not accept any blood transfusion or blood products for any reason including to stave off death   HEALTH MAINTENANCE: Social History   Tobacco Use   Smoking status: Never    Passive exposure: Never   Smokeless tobacco: Never  Vaping Use   Vaping Use: Never used  Substance Use Topics   Alcohol use: No   Drug use: No     Colonoscopy: 10/2019 (Dr. Collene Mares), recall 2026  PAP: 03/2017, negative  Bone density: 09/2018, -0.8   Allergies  Allergen Reactions   Lisinopril Shortness Of Breath and Cough    wheezing    Current Outpatient Medications  Medication Sig Dispense Refill   acetaminophen (TYLENOL) 500 MG tablet Take 500 mg by mouth every 6 (six) hours as needed for moderate pain.     albuterol (PROVENTIL HFA;VENTOLIN HFA) 108 (90 Base) MCG/ACT inhaler Inhale 1-2 puffs into the lungs every 4 (four) hours as needed for wheezing or shortness of breath. 18 g 2   albuterol (VENTOLIN HFA) 108 (90 Base) MCG/ACT  inhaler Inhale 2 puffs into the lungs every 6 (six) hours as needed for wheezing or shortness of breath. 8 g 0   anastrozole (ARIMIDEX) 1 MG tablet Take 1 tablet (1 mg total) by mouth daily. 30 tablet 4   benzonatate (TESSALON) 100 MG capsule Take 1 capsule (100 mg total) by mouth 3 (three) times daily as needed. 30 capsule 0   CALCIUM-MAGNESIUM PO Take 30 mLs by mouth once a week.     Cholecalciferol 125 MCG (5000 UT) capsule Take 5,000 Units by mouth daily.     empagliflozin (JARDIANCE) 10 MG TABS tablet Take 1 tablet (10 mg total) by mouth daily. 90 tablet 3   glucose blood (ONETOUCH VERIO) test strip CHECK BLOOD GLUCOSE DAILY 75 strip 0   loratadine (CLARITIN) 10 MG tablet TAKE 1 TABLET(10 MG) BY MOUTH DAILY 90 tablet 3   loratadine (CLARITIN) 10 MG tablet Take 1 tablet (10 mg total) by mouth daily. 30 tablet 11   Menaquinone-7 (VITAMIN K2) 100 MCG CAPS Take 100 mcg by mouth daily.      metFORMIN (GLUCOPHAGE-XR) 500 MG 24 hr tablet Take 2 tablets (1,000 mg total) by mouth 2 (two) times daily with a meal. 360 tablet 3   Multiple Vitamin (MULTIVITAMIN) LIQD Take 30 mLs by mouth every other day.     Olopatadine HCl 0.6 % SOLN Place 2 sprays into both nostrils 2 (two) times daily as needed (allergies).     omeprazole (PRILOSEC) 40 MG capsule Take 1 capsule (40 mg total) by mouth daily. 30 capsule 2   Polyethylene Glycol 400 (BLINK TEARS OP) Place 1 drop into both eyes daily as needed (dry eyes).     triamcinolone ointment (KENALOG) 0.5 % Apply 1 Application topically 2 (two) times daily. 30 g 0   No current facility-administered medications for this visit.   Facility-Administered Medications Ordered in Other Visits  Medication Dose Route Frequency Provider Last Rate Last Admin   sodium chloride flush (NS) 0.9 % injection 10 mL  10 mL Intracatheter PRN Magrinat, Virgie Dad, MD       SUPPLEMENTS: Aside from the medications listed above, the patient takes a teaspoon of olive oil daily, 1000 mg of vitamin C every other day, sunflower lecithin 600 mg every other day, vitamin D3 5000 mg every other day, magnesium 200 mg every other day, milk thistle 150 mg every other day, a B complex vitamin (B6 and B12) every other day, as well as Osteo Bi-Flex weekly and fluticasone as needed   OBJECTIVE:  Vitals:   04/30/22 0927  BP: 115/75  Pulse: 99  Resp: 14  Temp: 97.8 F (36.6 C)  SpO2: 97%       Body mass index is 30.31 kg/m.   Wt Readings from Last 3 Encounters:  04/30/22 171 lb 1.6 oz (77.6 kg)  04/24/22 174 lb (78.9 kg)  02/12/22 180 lb (81.6 kg)  ECOG FS:1 - Symptomatic but completely ambulatory  Physical Exam Constitutional:      Appearance: Normal appearance.  Chest:     Comments: Bilateral breasts examined.  Postradiation changes along with lymphedema noted in the right breast with some caving of the surgical scar which has been stable.  No other palpable masses or  regional adenopathy.  Left breast normal to inspection and palpation. Musculoskeletal:     Cervical back: Normal range of motion and neck supple. No rigidity.  Lymphadenopathy:     Cervical: No cervical adenopathy.  Neurological:     Mental Status:  She is alert.      LAB RESULTS:  CMP     Component Value Date/Time   NA 139 01/05/2022 0926   NA 138 11/04/2017 0000   K 4.4 01/05/2022 0926   CL 102 01/05/2022 0926   CO2 28 01/05/2022 0926   GLUCOSE 100 (H) 01/05/2022 0926   BUN 13 01/05/2022 0926   BUN 14 11/04/2017 0000   CREATININE 0.62 01/05/2022 0926   CREATININE 0.63 01/15/2021 0943   CREATININE 0.60 09/07/2014 0950   CALCIUM 10.4 01/05/2022 0926   CALCIUM 9.7 10/02/2020 1030   PROT 6.7 01/05/2022 0926   ALBUMIN 4.2 01/05/2022 0926   AST 20 01/05/2022 0926   AST 26 01/15/2021 0943   ALT 20 01/05/2022 0926   ALT 27 01/15/2021 0943   ALKPHOS 141 (H) 01/05/2022 0926   BILITOT 0.3 01/05/2022 0926   BILITOT 0.5 01/15/2021 0943   GFRNONAA >60 04/14/2021 0816   GFRNONAA >60 01/15/2021 0943   GFRAA >60 05/15/2019 1803    No results found for: "TOTALPROTELP", "ALBUMINELP", "A1GS", "A2GS", "BETS", "BETA2SER", "GAMS", "MSPIKE", "SPEI"  Lab Results  Component Value Date   WBC 6.2 02/12/2022   NEUTROABS 3.6 05/12/2021   HGB 13.3 02/12/2022   HCT 42.3 02/12/2022   MCV 93.4 02/12/2022   PLT 284 02/12/2022    No results found for: "LABCA2"  No components found for: "[id number]"  No results for input(s): "INR" in the last 168 hours.  No results found for: "LABCA2"  No results found for: "[id number]"  No results found for: "CAN125"  No results found for: "CAN153"  No results found for: "CA2729"  No components found for: "HGQUANT"  No results found for: "CEA1", "CEA" / No results found for: "CEA1", "CEA"   No results found for: "AFPTUMOR"  No results found for: "CHROMOGRNA"  No results found for: "KPAFRELGTCHN", "LAMBDASER", "KAPLAMBRATIO" (kappa/lambda light  chains)  No results found for: "HGBA", "HGBA2QUANT", "HGBFQUANT", "HGBSQUAN" (Hemoglobinopathy evaluation)   No results found for: "LDH"  Lab Results  Component Value Date   IRON 38 (L) 10/16/2009   IRONPCTSAT 10.2 (L) 10/16/2009   (Iron and TIBC)  Lab Results  Component Value Date   FERRITIN 8.9 (L) 10/16/2009    Urinalysis    Component Value Date/Time   COLORURINE YELLOW 03/11/2009 1100   APPEARANCEUR CLEAR 03/11/2009 1100   LABSPEC 1.017 03/11/2009 1100   PHURINE 7.0 03/11/2009 1100   GLUCOSEU NEGATIVE 03/11/2009 1100   HGBUR LARGE (A) 03/11/2009 1100   BILIRUBINUR N 05/21/2015 1614   KETONESUR NEGATIVE 03/11/2009 1100   PROTEINUR N 05/21/2015 1614   PROTEINUR NEGATIVE 03/11/2009 1100   UROBILINOGEN negative 05/21/2015 1614   UROBILINOGEN 0.2 03/11/2009 1100   NITRITE N 05/21/2015 1614   NITRITE NEGATIVE 03/11/2009 1100   LEUKOCYTESUR Negative 05/21/2015 1614    STUDIES: No results found.   ELIGIBLE FOR AVAILABLE RESEARCH PROTOCOL: no  ASSESSMENT: 62 y.o. [place] woman status post right breast upper outer quadrant biopsy 04/10/2020 for a clinical T2N0, stage IIA invasive ductal carcinoma, grade 3, estrogen receptor positive, progesterone receptor and HER-2 negative, with an MIB-1 of 25%.  (1) Oncotype obtained from the original biopsy shows a score of 48, predicting a recurrence rate outside the breast within 9 years of 28% if the only systemic therapy is antiestrogen for 5 years.  It also predicts a greater than 15% benefit from chemotherapy.  (2) right lumpectomy and sentinel lymph node dissection 05/21/2020 shows a pT2 pN0, stage IIB invasive ductal carcinoma, grade  3, with negative margins.  (3) adjuvant chemotherapy to consist of cyclophosphamide and doxorubicin in dose dense fashion x4 starting 06/18/2020, with Neulasta day 3; followed by paclitaxel weekly x12 started 08/28/2020 through 11/13/2020  (a) echo 05/13/2020 shows an ejection fraction in the  55-60% range  (4) adjuvant radiation 03/27/2020    PLAN:  Patient is here for follow-up on anastrozole.  She is doing really well on anastrozole. No concerns on PE, large pendulous breasts with post surgical and post radiation changes. No other concerns Mammogram July 2024. Most recent bone density with mild osteopenia, encouraged continuing calcium, vitamin D supplementation as tolerated as well as some weightbearing exercises. Encouraged walking 30 min a day, 5 days a week.  Return clinic in 6 months or sooner.  Self breast exam recommended monthly.  Total time spent: 30 minutes  *Total Encounter Time as defined by the Centers for Medicare and Medicaid Services includes, in addition to the face-to-face time of a patient visit (documented in the note above) non-face-to-face time: obtaining and reviewing outside history, ordering and reviewing medications, tests or procedures, care coordination (communications with other health care professionals or caregivers) and documentation in the medical record.

## 2022-05-17 NOTE — Assessment & Plan Note (Deleted)
Well controlled, no changes to meds. Encouraged heart healthy diet such as the DASH diet and exercise as tolerated.  °

## 2022-05-17 NOTE — Assessment & Plan Note (Deleted)
hgba1c acceptable, minimize simple carbs. Increase exercise as tolerated. Continue current meds 

## 2022-05-17 NOTE — Assessment & Plan Note (Deleted)
Patient encouraged to maintain heart healthy diet, regular exercise, adequate sleep. Consider daily probiotics. Take medications as prescribed. Labs ordered and reviewed. Shingrix is the new shingles shot, 2 shots over 2-6 months, confirm coverage with insurance and document, then can return here for shots with nurse appt or at pharmacy. MGM up todate. Follows with OB/GYN, Dr Sabra Heck for [facility] 08/2021 repeat in 2024, paps 2022 repeat in 2025-27, Dexa July 2023.

## 2022-05-18 ENCOUNTER — Encounter: Payer: 59 | Admitting: Family Medicine

## 2022-05-18 DIAGNOSIS — I10 Essential (primary) hypertension: Secondary | ICD-10-CM

## 2022-05-18 DIAGNOSIS — Z0000 Encounter for general adult medical examination without abnormal findings: Secondary | ICD-10-CM

## 2022-05-18 DIAGNOSIS — E119 Type 2 diabetes mellitus without complications: Secondary | ICD-10-CM

## 2022-05-29 ENCOUNTER — Other Ambulatory Visit: Payer: Self-pay

## 2022-05-29 MED ORDER — METFORMIN HCL ER 500 MG PO TB24: 1000.0000 mg | ORAL_TABLET | Freq: Two times a day (BID) | ORAL | 3 refills | Status: DC

## 2022-07-14 ENCOUNTER — Other Ambulatory Visit: Payer: Self-pay | Admitting: Hematology and Oncology

## 2022-07-15 ENCOUNTER — Encounter: Payer: Self-pay | Admitting: Oncology

## 2022-08-08 ENCOUNTER — Other Ambulatory Visit: Payer: Self-pay | Admitting: Hematology and Oncology

## 2022-08-10 ENCOUNTER — Encounter: Payer: Self-pay | Admitting: Oncology

## 2022-08-24 ENCOUNTER — Encounter (INDEPENDENT_AMBULATORY_CARE_PROVIDER_SITE_OTHER): Payer: Self-pay | Admitting: Otolaryngology

## 2022-08-24 ENCOUNTER — Ambulatory Visit (INDEPENDENT_AMBULATORY_CARE_PROVIDER_SITE_OTHER): Payer: 59 | Admitting: Otolaryngology

## 2022-08-24 VITALS — BP 135/78 | HR 72 | Ht 63.0 in | Wt 171.0 lb

## 2022-08-24 DIAGNOSIS — H60332 Swimmer's ear, left ear: Secondary | ICD-10-CM | POA: Diagnosis not present

## 2022-08-24 DIAGNOSIS — H6123 Impacted cerumen, bilateral: Secondary | ICD-10-CM | POA: Diagnosis not present

## 2022-08-24 DIAGNOSIS — H6993 Unspecified Eustachian tube disorder, bilateral: Secondary | ICD-10-CM | POA: Diagnosis not present

## 2022-08-24 DIAGNOSIS — H608X3 Other otitis externa, bilateral: Secondary | ICD-10-CM | POA: Diagnosis not present

## 2022-08-24 DIAGNOSIS — H9193 Unspecified hearing loss, bilateral: Secondary | ICD-10-CM

## 2022-08-24 DIAGNOSIS — E119 Type 2 diabetes mellitus without complications: Secondary | ICD-10-CM

## 2022-08-24 DIAGNOSIS — H9212 Otorrhea, left ear: Secondary | ICD-10-CM

## 2022-08-24 DIAGNOSIS — H938X3 Other specified disorders of ear, bilateral: Secondary | ICD-10-CM

## 2022-08-24 DIAGNOSIS — R0981 Nasal congestion: Secondary | ICD-10-CM

## 2022-08-24 MED ORDER — OFLOXACIN 0.3 % OT SOLN: 5.0000 [drp] | Freq: Every day | OTIC | 0 refills | Status: AC

## 2022-08-24 NOTE — Progress Notes (Signed)
ENT CONSULT:  Reason for Consult: Left ear pain and drainage concern for ear infection   HPI: Yolanda [name] is an 62 y.o. female with hx of left ear pain and bilateral ear drainage x 2 week. She has a hx of left ear infections, last ear infection was 2-3 yrs ago. She is diabetic, on oral medications. No prior ear surgeries. She feels hearing is muffled. She had audiogram 20 yrs ago, and at the time it was normal. Denies vertigo, but has mild buzzing in the left ear since she started having ear sx 2 weeks ago. No medications or ear drops. Denies URI or swimming recently.    Records Reviewed:  On Metformin  On PPI for reflux Hx of breast cancer  stage IIA Hx of OSA and NASH    Past Medical History:  Diagnosis Date   Anemia May 2022   Chemotherapy   Asthma    with respiratory infection   Cancer [facility])    breast cancer - right   Cataract March 2018   Optometrist visit   Diabetes mellitus without complication ([facility])    type II   Fatty liver    Fibroid uterus    GERD (gastroesophageal reflux disease) June 2021   Hypertension    Patient reports that she has never been diagnosied with HTN, "I take HCTZ for swelling in my feet, if needed."   Osteoarthritis 03/10/2015   PONV (postoperative nausea and vomiting)    2014   Refusal of blood transfusions as patient is Jehovah's Witness    NO BLOOD PRODUCTS   Sleep apnea     Past Surgical History:  Procedure Laterality Date   BREAST CYST ASPIRATION     BREAST EXCISIONAL BIOPSY     BREAST LUMPECTOMY WITH SENTINEL LYMPH NODE BIOPSY Right 05/21/2020   Procedure: RIGHT BREAST LUMPECTOMY WITH SENTINEL LYMPH NODE BIOPSY;  Surgeon: Almond Lint, MD;  Location[facility];  Service: General;  Laterality: Right;   CHOLECYSTECTOMY  02/2009   COLONOSCOPY     DILATATION & CURRETTAGE/HYSTEROSCOPY WITH RESECTOCOPE N/A 03/13/2014   Procedure: DILATATION & CURETTAGE/HYSTEROSCOPY ;  Surgeon: Annamaria Boots, MD;  Location: [facility];  Service:  Gynecology;  Laterality: N/A;   PORTA CATH REMOVAL  01/2021   PORTACATH PLACEMENT Left 05/21/2020   Procedure: INSERTION PORT-A-CATH;  Surgeon: Almond Lint, MD;  Location: [facility];  Service: General;  Laterality: Left;   RADIOACTIVE SEED GUIDED EXCISIONAL BREAST BIOPSY Left 12/30/2016   Procedure: RADIOACTIVE SEED GUIDED EXCISIONAL BREAST BIOPSY;  Surgeon: Almond Lint, MD;  Location: [facility];  Service: General;  Laterality: Left;    Family History  Problem Relation Age of Onset   Asthma Mother    Diabetes Mother    Diabetes Father    Heart attack Father    Heart disease Father    Heart Problems Sister    Asthma Sister    Hypertension Sister    Diabetes Brother    Diabetes Brother    Asthma Brother    Heart disease Maternal Grandfather     Social History:  reports that she has never smoked. She has never been exposed to tobacco smoke. She has never used smokeless tobacco. She reports that she does not drink alcohol and does not use drugs.  Allergies:  Allergies  Allergen Reactions   Lisinopril Shortness Of Breath and Cough    wheezing    Medications: I have reviewed the patient's current medications.  No results found for this or  any previous visit (from the past 48 hour(s)).  No results found.  The PMH, PSH, Medications, Allergies, and SH were reviewed and updated.  ROS: Constitutional: Negative for fever, weight loss and weight gain. Cardiovascular: Negative for chest pain and dyspnea on exertion. Respiratory: Is not experiencing shortness of breath at rest. Gastrointestinal: Negative for nausea and vomiting. Neurological: Negative for headaches. Psychiatric: The patient is not nervous/anxiou  Blood pressure 135/78, pulse 72, height 5\' 3"  (1.6 m), weight 171 lb (77.6 kg), last menstrual period 02/21/2014, SpO2 98 %.  PHYSICAL EXAM:  Exam: General: Well-developed, well-nourished Communication and Voice: Clear pitch and  clarity Respiratory Respiratory effort: Equal inspiration and expiration without stridor Cardiovascular Peripheral Vascular: Warm extremities with equal color/perfusion Eyes: No nystagmus with equal extraocular motion bilaterally Neuro/Psych/Balance: Patient oriented to person, place, and time; Appropriate mood and affect; Gait is intact with no imbalance; Cranial nerves I-XII are intact Head and Face Inspection: Normocephalic and atraumatic without mass or lesion Palpation: Facial skeleton intact without bony stepoffs Salivary Glands: No mass or tenderness Facial Strength: Facial motility symmetric and full bilaterally ENT Pinna: External ear intact and fully developed External canal: Canal is patent with intact skin Tympanic Membrane: Clear and mobile External Nose: No scar or anatomic deformity Internal Nose: Septum intact and midline. No edema, polyp, or rhinorrhea Lips, Teeth, and gums: Mucosa and teeth intact and viable TMJ: No pain to palpation with full mobility Oral cavity/oropharynx: No erythema or exudate, no lesions present Neck Neck and Trachea: Midline trachea without mass or lesion Thyroid: No mass or nodularity Lymphatics: No lymphadenopathy  Procedure:  Procedure: Cerumen Removal, Bilateral (CPT [id number])  Diagnosis: cerumen impaction, bilateral   Informed consent: Timeout performed and informed consent was obtained.  Procedure: Operating microscope was employed to evaluate the ear(s).  Cerumen curette, speculum and suction were employed to clear the cerumen.   Findings: Normal appearing tympanic membrane on the left without perforations, and external canals are normal after removal of cerumen.No middle ear fluid bilaterally.   Complications: None. Patient tolerated well.    Studies Reviewed:none  Assessment/Plan: Encounter Diagnoses  Name Primary?   Type 2 diabetes mellitus without complication, without long-term current use of insulin (HCC)     Bilateral hearing loss, unspecified hearing loss type Yes   Bilateral impacted cerumen    Sensation of fullness in both ears    Otorrhea of left ear    Acute swimmer's ear of left side    Chronic eczematous otitis externa of both ears    Dysfunction of both eustachian tubes    Nasal congestion    62 year old female with history of breast cancer of the right breast history of OSA nasal congestion who presents with recent episodes of left ear pain and drainage which improved at this point.  She was told that she has cerumen impaction.  Her ear drainage is mild and she denies severe ear pain, dizziness or vertigo.  No prior ear surgeries.  Exam consistent with cerumen impaction on both sides, she feels hearing is somewhat improved after bilateral cerumen impaction removal today.  Will order screening audiogram to exclude sensorineural hearing loss for other causes of conductive versus mixed hearing loss and she will return for follow-up after testing.   - ear drop (Floxin) left ear twice daily x 5 days then stop  - use Sweet Oil for itchy ears  - use soft diet/Motrin and warm compresses for jaw issue/TMJ pain on the left side  - continue Flonase and Zyrtec  OTC for allergies nasal congestion  - return in 6 weeks   Thank you for allowing me to participate in the care of this patient. Please do not hesitate to contact me with any questions or concerns.   Ashok Croon, MD Otolaryngology [facility] Phone: [phone] Fax: [phone]    08/24/2022, 6:33 PM

## 2022-08-24 NOTE — Patient Instructions (Signed)
-   ear drop (Floxin) left ear twice daily x 5 days then stop  - use Sweet Oil for itchy ears  - use soft diet/Motrin and warm compresses for jaw issue/TMJ pain on the left side  - continue Flonase and Zyrtec OTC for allergies nasal congestion  - return in 6 weeks

## 2022-09-16 ENCOUNTER — Encounter (INDEPENDENT_AMBULATORY_CARE_PROVIDER_SITE_OTHER): Payer: Self-pay

## 2022-09-18 ENCOUNTER — Ambulatory Visit: Payer: 59 | Attending: Otolaryngology | Admitting: Audiologist

## 2022-09-18 DIAGNOSIS — H903 Sensorineural hearing loss, bilateral: Secondary | ICD-10-CM | POA: Diagnosis not present

## 2022-09-18 NOTE — Procedures (Signed)
  Outpatient Audiology and [facility] [address] [place], [place]  [address] [phone]  AUDIOLOGICAL  EVALUATION  NAME: Yolanda [name]     DOB:   [birth year]      MRN: [id number]                                                                                     DATE: 09/18/2022     REFERENT: Bradd Canary, MD STATUS: Outpatient DIAGNOSIS: Sensorineural Hearing Loss   History: Yolanda [name] was seen for an audiological evaluation due to a recent ear infection. Yolanda [name] recently had an ear infection in the left ear. Yolanda [name] has seen Otolaryngology and received medication. She feels it is improving but still has some mild pressure and muffled hearing. She has some difficulty hearing people clearly, her husband feels she does not hear well. Yolanda [name]'s sister and brother have hearing loss and use aids. Yolanda [name] denies any hazardous noise exposure history. Yolanda [name] has history of diabetes. No other relevant case history reported.   Evaluation:  Otoscopy showed a clear view of the tympanic membranes, bilaterally Tympanometry results were consistent with normal middle ear function, bilaterally    Audiometric testing was completed using conventional audiometry with supraural transducer. Speech Recognition Thresholds were  30dB in the right ear and 25dB in the left ear. Word Recognition was performed  40dB SL, scored  100% in the right ear and 100% in the left ear. Pure tone thresholds show normal sloping after 3kHz to moderate sensorineural hearing loss in each ear.   Results:  The test results were reviewed with Yolanda [name]. Her middle ears has normal function showing left ear is healing. Yolanda [name] has a moderate high pitched hearing loss. She does not perceive daily difficulty hearing and is not interested in hearing aids at this time.    Recommendations: 1.   Annual audiometric testing recommended due to moderate high pitched loss with history of diabetes.   32 minutes spent testing and counseling on results.    Ammie Ferrier  Audiologist, Au.D., CCC-A 09/18/2022  9:31 AM  Cc: Bradd Canary, MD

## 2022-09-29 ENCOUNTER — Encounter: Payer: 59 | Admitting: Family Medicine

## 2022-10-05 ENCOUNTER — Encounter (INDEPENDENT_AMBULATORY_CARE_PROVIDER_SITE_OTHER): Payer: Self-pay | Admitting: Otolaryngology

## 2022-10-05 ENCOUNTER — Ambulatory Visit (INDEPENDENT_AMBULATORY_CARE_PROVIDER_SITE_OTHER): Payer: 59 | Admitting: Otolaryngology

## 2022-10-05 VITALS — BP 144/84 | HR 67

## 2022-10-05 DIAGNOSIS — B369 Superficial mycosis, unspecified: Secondary | ICD-10-CM | POA: Diagnosis not present

## 2022-10-05 DIAGNOSIS — H608X3 Other otitis externa, bilateral: Secondary | ICD-10-CM | POA: Diagnosis not present

## 2022-10-05 DIAGNOSIS — H903 Sensorineural hearing loss, bilateral: Secondary | ICD-10-CM

## 2022-10-05 DIAGNOSIS — H6123 Impacted cerumen, bilateral: Secondary | ICD-10-CM

## 2022-10-05 MED ORDER — CLOTRIMAZOLE 1 % EX SOLN: 1.0000 | Freq: Two times a day (BID) | CUTANEOUS | 0 refills | Status: AC

## 2022-10-05 NOTE — Patient Instructions (Signed)
-   use Clotrimazole ear drop on the right side, do it for 5 days then stop - keep the ears dry - switch to using Sweet Oil twice a week for itchy ears after you finish 5 days of Clotrimazole - return in 6-12 months for repeat ear check

## 2022-10-05 NOTE — Progress Notes (Signed)
ENT PROGRESS NOTE:  Update 10/05/22: She had Audiogram and it showed SNHL in high-freq c/w noise-induced hearing loss/age related hearing WRS 100%. She has itchy ears.  No ear pain. No ear drainage.   Original HPI:   Reason for Consult: Left ear pain and drainage concern for ear infection   HPI: Yolanda [name] is an 62 y.o. female with hx of left ear pain and bilateral ear drainage x 2 week. She has a hx of left ear infections, last ear infection was 2-3 yrs ago. She is diabetic, on oral medications. No prior ear surgeries. She feels hearing is muffled. She had audiogram 20 yrs ago, and at the time it was normal. Denies vertigo, but has mild buzzing in the left ear since she started having ear sx 2 weeks ago. No medications or ear drops. Denies URI or swimming recently.    Records Reviewed:  On Metformin  On PPI for reflux Hx of breast cancer  stage IIA Hx of OSA and NASH    Past Medical History:  Diagnosis Date   Anemia May 2022   Chemotherapy   Asthma    with respiratory infection   Cancer [facility])    breast cancer - right   Cataract March 2018   Optometrist visit   Diabetes mellitus without complication (HCC)    type II   Fatty liver    Fibroid uterus    GERD (gastroesophageal reflux disease) June 2021   Hypertension    Patient reports that she has never been diagnosied with HTN, "I take HCTZ for swelling in my feet, if needed."   Osteoarthritis 03/10/2015   PONV (postoperative nausea and vomiting)    2014   Refusal of blood transfusions as patient is Jehovah's Witness    NO BLOOD PRODUCTS   Sleep apnea     Past Surgical History:  Procedure Laterality Date   BREAST CYST ASPIRATION     BREAST EXCISIONAL BIOPSY     BREAST LUMPECTOMY WITH SENTINEL LYMPH NODE BIOPSY Right 05/21/2020   Procedure: RIGHT BREAST LUMPECTOMY WITH SENTINEL LYMPH NODE BIOPSY;  Surgeon: Almond Lint, MD;  Location: [facility] OR;  Service: General;  Laterality: Right;   CHOLECYSTECTOMY  02/2009    COLONOSCOPY     DILATATION & CURRETTAGE/HYSTEROSCOPY WITH RESECTOCOPE N/A 03/13/2014   Procedure: DILATATION & CURETTAGE/HYSTEROSCOPY ;  Surgeon: Annamaria Boots, MD;  Location: [facility] ORS;  Service: Gynecology;  Laterality: N/A;   PORTA CATH REMOVAL  01/2021   PORTACATH PLACEMENT Left 05/21/2020   Procedure: INSERTION PORT-A-CATH;  Surgeon: Almond Lint, MD;  Location: [facility] OR;  Service: General;  Laterality: Left;   RADIOACTIVE SEED GUIDED EXCISIONAL BREAST BIOPSY Left 12/30/2016   Procedure: RADIOACTIVE SEED GUIDED EXCISIONAL BREAST BIOPSY;  Surgeon: Almond Lint, MD;  Location: [facility];  Service: General;  Laterality: Left;    Family History  Problem Relation Age of Onset   Asthma Mother    Diabetes Mother    Diabetes Father    Heart attack Father    Heart disease Father    Heart Problems Sister    Asthma Sister    Hypertension Sister    Diabetes Brother    Diabetes Brother    Asthma Brother    Heart disease Maternal Grandfather     Social History:  reports that she has never smoked. She has never been exposed to tobacco smoke. She has never used smokeless tobacco. She reports that she does not drink alcohol and does not use drugs.  Allergies:  Allergies  Allergen Reactions   Lisinopril Shortness Of Breath and Cough    wheezing    Medications: I have reviewed the patient's current medications.  No results found for this or any previous visit (from the past 48 hour(s)).  No results found.  The PMH, PSH, Medications, Allergies, and SH were reviewed and updated.  ROS: Constitutional: Negative for fever, weight loss and weight gain. Cardiovascular: Negative for chest pain and dyspnea on exertion. Respiratory: Is not experiencing shortness of breath at rest. Gastrointestinal: Negative for nausea and vomiting. Neurological: Negative for headaches. Psychiatric: The patient is not nervous/anxious  Blood pressure (!) 144/84, pulse 67, last menstrual  period 02/21/2014, SpO2 94%.  PHYSICAL EXAM:  Exam: General: Well-developed, well-nourished Communication and Voice: Clear pitch and clarity Respiratory Respiratory effort: Equal inspiration and expiration without stridor Cardiovascular Eyes: No nystagmus with equal extraocular motion bilaterally Neuro/Psych/Balance: Patient oriented to person, place, and time; Appropriate mood and affect; Gait is intact with no imbalance; Cranial nerves I-XII are intact Head and Face Inspection: Normocephalic and atraumatic without mass or lesion Facial Strength: Facial motility symmetric and full bilaterally ENT Pinna: External ear intact and fully developed External canal: Canal is patent with intact skin Tympanic Membrane: Clear and mobile External Nose: No scar or anatomic deformity    Procedure:  Procedure: Cerumen Removal, Bilateral (CPT [id number])  Diagnosis: cerumen impaction, bilateral   Informed consent: Timeout performed and informed consent was obtained.  Procedure: Operating microscope was employed to evaluate the ear(s).  Cerumen curette, speculum and suction were employed to clear the cerumen.   Findings: Normal appearing tympanic membranes without perforations, and external canals are normal after removal of cerumen.No middle ear fluid bilaterally. White fungal hyphea mixed with cerumen on the right. Removed.   Complications: None. Patient tolerated well.    Studies Reviewed:none  Assessment/Plan: Encounter Diagnoses  Name Primary?   Chronic eczematous otitis externa of both ears    Otitis externa, fungal, right ear Yes   Bilateral impacted cerumen    Sensorineural hearing loss (SNHL) of both ears     62 year old female with history of breast cancer of the right breast history of OSA nasal congestion who presents with recent episodes of left ear pain and drainage which improved at this point.  She was told that she has cerumen impaction.  Her ear drainage is mild and  she denies severe ear pain, dizziness or vertigo.  No prior ear surgeries.  Exam consistent with cerumen impaction on both sides, she feels hearing is somewhat improved after bilateral cerumen impaction removal today.  Will order screening audiogram to exclude sensorineural hearing loss for other causes of conductive versus mixed hearing loss and she will return for follow-up after testing.   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 She has itchy ears.  No pain or ear drainage. She had evidence of fungal hyphae on the right side today along with bilateral cerumen impaction and dry skin debris, which we cleared with instrumentation. She did not have canal edema and no evidence of middle ear effusion.   - continue Flonase and  Zyrtec for nasal congestion - Use Clotrimazole ggt to the right ear for 5 days then stop - keep the ears dry - switch to using Sweet Oil twice a week for itchy ears after you finish 5 days of Clotrimazole - return in 6-12 months for repeat ear check (or sooner if develop other ENT sx or recurrence of sx)     Ashok Croon, MD Otolaryngology [facility] Phone: [phone] Fax: [phone]    10/05/2022, 9:09 AM

## 2022-10-07 NOTE — Assessment & Plan Note (Signed)
Patient encouraged to maintain heart healthy diet, regular exercise, adequate sleep. Consider daily probiotics. Take medications as prescribed. Labs ordered and reviewed. Follows with OB/GYN, Dr Hyacinth Meeker for [facility] 08/2021 repeat in next year, paps 2022 repeat in 2025 to 2027, Dexa done 08/2021 repeat in 2026-28. Given and reviewed copy of ACP documents from [employer] and encouraged to complete and return. Colonoscopy 2021 repeat in 2026

## 2022-10-07 NOTE — Assessment & Plan Note (Addendum)
Maintained Arimedex and following with oncology. She has had her incision open up over her right breast. She has been seen by her surgeon and for now they are treating it conservatively and it is healing slowly

## 2022-10-07 NOTE — Assessment & Plan Note (Signed)
hgba1c acceptable, minimize simple carbs. Increase exercise as tolerated. Continue current meds 

## 2022-10-08 ENCOUNTER — Ambulatory Visit: Payer: 59 | Admitting: Family Medicine

## 2022-10-08 ENCOUNTER — Encounter: Payer: Self-pay | Admitting: Family Medicine

## 2022-10-08 VITALS — BP 130/80 | HR 71 | Temp 98.0°F | Resp 16 | Ht 63.0 in | Wt 172.6 lb

## 2022-10-08 DIAGNOSIS — R319 Hematuria, unspecified: Secondary | ICD-10-CM

## 2022-10-08 DIAGNOSIS — E1169 Type 2 diabetes mellitus with other specified complication: Secondary | ICD-10-CM | POA: Diagnosis not present

## 2022-10-08 DIAGNOSIS — Z170 Estrogen receptor positive status [ER+]: Secondary | ICD-10-CM

## 2022-10-08 DIAGNOSIS — I10 Essential (primary) hypertension: Secondary | ICD-10-CM

## 2022-10-08 DIAGNOSIS — N898 Other specified noninflammatory disorders of vagina: Secondary | ICD-10-CM

## 2022-10-08 DIAGNOSIS — Z0000 Encounter for general adult medical examination without abnormal findings: Secondary | ICD-10-CM

## 2022-10-08 DIAGNOSIS — E785 Hyperlipidemia, unspecified: Secondary | ICD-10-CM

## 2022-10-08 DIAGNOSIS — E669 Obesity, unspecified: Secondary | ICD-10-CM | POA: Diagnosis not present

## 2022-10-08 DIAGNOSIS — R748 Abnormal levels of other serum enzymes: Secondary | ICD-10-CM

## 2022-10-08 DIAGNOSIS — C50411 Malignant neoplasm of upper-outer quadrant of right female breast: Secondary | ICD-10-CM

## 2022-10-08 DIAGNOSIS — N6092 Unspecified benign mammary dysplasia of left breast: Secondary | ICD-10-CM

## 2022-10-08 DIAGNOSIS — K760 Fatty (change of) liver, not elsewhere classified: Secondary | ICD-10-CM

## 2022-10-08 MED ORDER — FLUCONAZOLE 150 MG PO TABS: 150.0000 mg | ORAL_TABLET | ORAL | 1 refills | Status: DC

## 2022-10-08 NOTE — Patient Instructions (Addendum)
Encouraged increased hydration and fiber in diet. Daily probiotics. If bowels not moving can use Milk Of Magnesium   The Blue Zones on Netflix  Preventive Care 52-63 Years Old, Female Preventive care refers to lifestyle choices and visits with your health care provider that can promote health and wellness. Preventive care visits are also called wellness exams. What can I expect for my preventive care visit? Counseling Your health care provider may ask you questions about your: Medical history, including: Past medical problems. Family medical history. Pregnancy history. Current health, including: Menstrual cycle. Method of birth control. Emotional well-being. Home life and relationship well-being. Sexual activity and sexual health. Lifestyle, including: Alcohol, nicotine or tobacco, and drug use. Access to firearms. Diet, exercise, and sleep habits. Work and work Astronomer. Sunscreen use. Safety issues such as seatbelt and bike helmet use. Physical exam Your health care provider will check your: Height and weight. These may be used to calculate your BMI (body mass index). BMI is a measurement that tells if you are at a healthy weight. Waist circumference. This measures the distance around your waistline. This measurement also tells if you are at a healthy weight and may help predict your risk of certain diseases, such as type 2 diabetes and high blood pressure. Heart rate and blood pressure. Body temperature. Skin for abnormal spots. What immunizations do I need?  Vaccines are usually given at various ages, according to a schedule. Your health care provider will recommend vaccines for you based on your age, medical history, and lifestyle or other factors, such as travel or where you work. What tests do I need? Screening Your health care provider may recommend screening tests for certain conditions. This may include: Lipid and cholesterol levels. Diabetes screening. This is  done by checking your blood sugar (glucose) after you have not eaten for a while (fasting). Pelvic exam and Pap test. Hepatitis B test. Hepatitis C test. HIV (human immunodeficiency virus) test. STI (sexually transmitted infection) testing, if you are at risk. Lung cancer screening. Colorectal cancer screening. Mammogram. Talk with your health care provider about when you should start having regular mammograms. This may depend on whether you have a family history of breast cancer. BRCA-related cancer screening. This may be done if you have a family history of breast, ovarian, tubal, or peritoneal cancers. Bone density scan. This is done to screen for osteoporosis. Talk with your health care provider about your test results, treatment options, and if necessary, the need for more tests. Follow these instructions at home: Eating and drinking  Eat a diet that includes fresh fruits and vegetables, whole grains, lean protein, and low-fat dairy products. Take vitamin and mineral supplements as recommended by your health care provider. Do not drink alcohol if: Your health care provider tells you not to drink. You are pregnant, may be pregnant, or are planning to become pregnant. If you drink alcohol: Limit how much you have to 0-1 drink a day. Know how much alcohol is in your drink. In the [place] one drink equals one 12 oz bottle of beer (355 mL), one 5 oz glass of wine (148 mL), or one 1 oz glass of hard liquor (44 mL). Lifestyle Brush your teeth every morning and night with fluoride toothpaste. Floss one time each day. Exercise for at least 30 minutes 5 or more days each week. Do not use any products that contain nicotine or tobacco. These products include cigarettes, chewing tobacco, and vaping devices, such as e-cigarettes. If you need help quitting,  ask your health care provider. Do not use drugs. If you are sexually active, practice safe sex. Use a condom or other form of protection to  prevent STIs. If you do not wish to become pregnant, use a form of birth control. If you plan to become pregnant, see your health care provider for a prepregnancy visit. Take aspirin only as told by your health care provider. Make sure that you understand how much to take and what form to take. Work with your health care provider to find out whether it is safe and beneficial for you to take aspirin daily. Find healthy ways to manage stress, such as: Meditation, yoga, or listening to music. Journaling. Talking to a trusted person. Spending time with friends and family. Minimize exposure to UV radiation to reduce your risk of skin cancer. Safety Always wear your seat belt while driving or riding in a vehicle. Do not drive: If you have been drinking alcohol. Do not ride with someone who has been drinking. When you are tired or distracted. While texting. If you have been using any mind-altering substances or drugs. Wear a helmet and other protective equipment during sports activities. If you have firearms in your house, make sure you follow all gun safety procedures. Seek help if you have been physically or sexually abused. What's next? Visit your health care provider once a year for an annual wellness visit. Ask your health care provider how often you should have your eyes and teeth checked. Stay up to date on all vaccines. This information is not intended to replace advice given to you by your health care provider. Make sure you discuss any questions you have with your health care provider. Document Revised: 07/31/2020 Document Reviewed: 07/31/2020 Elsevier Patient Education  2024 [employer]  2 tbls po in 4 oz of warm prune juice by mouth every 2-3 days. If no results then repeat in 4 hours with  Dulcolax suppository pr, may repeat again in 4 more hours as needed. Seek care if symptoms worsen. Consider daily Miralax and/or Dulcolax if symptoms persist.

## 2022-10-09 ENCOUNTER — Other Ambulatory Visit: Payer: Self-pay | Admitting: Family Medicine

## 2022-10-09 DIAGNOSIS — R319 Hematuria, unspecified: Secondary | ICD-10-CM | POA: Insufficient documentation

## 2022-10-09 LAB — COMPREHENSIVE METABOLIC PANEL
ALT: 27 U/L (ref 0–35)
AST: 27 U/L (ref 0–37)
Albumin: 4.3 g/dL (ref 3.5–5.2)
Alkaline Phosphatase: 178 U/L — ABNORMAL HIGH (ref 39–117)
BUN: 11 mg/dL (ref 6–23)
CO2: 30 meq/L (ref 19–32)
Calcium: 11.2 mg/dL — ABNORMAL HIGH (ref 8.4–10.5)
Chloride: 100 meq/L (ref 96–112)
Creatinine, Ser: 0.7 mg/dL (ref 0.40–1.20)
GFR: 92.03 mL/min (ref >60.00)
Glucose, Bld: 93 mg/dL (ref 70–99)
Potassium: 4.4 meq/L (ref 3.5–5.1)
Sodium: 138 meq/L (ref 135–145)
Total Bilirubin: 0.3 mg/dL (ref 0.2–1.2)
Total Protein: 7.8 g/dL (ref 6.0–8.3)

## 2022-10-09 LAB — URINALYSIS, ROUTINE W REFLEX MICROSCOPIC
Bilirubin Urine: NEGATIVE
Hgb urine dipstick: NEGATIVE
Ketones, ur: NEGATIVE
Nitrite: NEGATIVE
Specific Gravity, Urine: <=1.005 — AB (ref 1.000–1.030)
Total Protein, Urine: NEGATIVE
Urine Glucose: 500 — AB
Urobilinogen, UA: 0.2 (ref 0.0–1.0)
pH: 6.5 (ref 5.0–8.0)

## 2022-10-09 LAB — MICROALBUMIN / CREATININE URINE RATIO
Creatinine,U: 12.4 mg/dL
Microalb Creat Ratio: 5.6 mg/g (ref 0.0–30.0)
Microalb, Ur: <0.7 mg/dL (ref 0.0–1.9)

## 2022-10-09 LAB — CBC WITH DIFFERENTIAL/PLATELET
Basophils Absolute: 0 10*3/uL (ref 0.0–0.1)
Basophils Relative: 0.2 % (ref 0.0–3.0)
Eosinophils Absolute: 0.3 10*3/uL (ref 0.0–0.7)
Eosinophils Relative: 4 % (ref 0.0–5.0)
HCT: 43.7 % (ref 36.0–46.0)
Hemoglobin: 13.6 g/dL (ref 12.0–15.0)
Lymphocytes Relative: 35.1 % (ref 12.0–46.0)
Lymphs Abs: 3 10*3/uL (ref 0.7–4.0)
MCHC: 31.1 g/dL (ref 30.0–36.0)
MCV: 91.6 fl (ref 78.0–100.0)
Monocytes Absolute: 0.8 10*3/uL (ref 0.1–1.0)
Monocytes Relative: 8.9 % (ref 3.0–12.0)
Neutro Abs: 4.4 10*3/uL (ref 1.4–7.7)
Neutrophils Relative %: 51.8 % (ref 43.0–77.0)
Platelets: 225 10*3/uL (ref 150.0–400.0)
RBC: 4.78 Mil/uL (ref 3.87–5.11)
RDW: 15.5 % (ref 11.5–15.5)
WBC: 8.5 10*3/uL (ref 4.0–10.5)

## 2022-10-09 LAB — LIPID PANEL
Cholesterol: 230 mg/dL — ABNORMAL HIGH (ref 0–200)
HDL: 62.1 mg/dL (ref >39.00)
LDL Cholesterol: 131 mg/dL — ABNORMAL HIGH (ref 0–99)
NonHDL: 168.02
Total CHOL/HDL Ratio: 4
Triglycerides: 187 mg/dL — ABNORMAL HIGH (ref 0.0–149.0)
VLDL: 37.4 mg/dL (ref 0.0–40.0)

## 2022-10-09 LAB — TSH: TSH: 1.81 u[IU]/mL (ref 0.35–5.50)

## 2022-10-09 LAB — HEMOGLOBIN A1C: Hgb A1c MFr Bld: 6.6 % — ABNORMAL HIGH (ref 4.6–6.5)

## 2022-10-09 NOTE — Assessment & Plan Note (Signed)
Will recheck isoenymes and monitor. Likely related to her open lesion in her breast

## 2022-10-09 NOTE — Assessment & Plan Note (Signed)
Well controlled, no changes to meds. Encouraged heart healthy diet such as the DASH diet and exercise as tolerated.  °

## 2022-10-09 NOTE — Progress Notes (Signed)
Subjective:    Patient ID: Yolanda [name], female    DOB: Jun 19, 1960, 62 y.o.   MRN: [id number]  Chief Complaint  Patient presents with   Annual Exam    Annual Exam    HPI Discussed the use of AI scribe software for clinical note transcription with the patient, who gave verbal consent to proceed.  History of Present Illness   The patient, with a history of fatty liver disease, presents with concerns about weight gain and difficulty maintaining weight loss. She reports a recent increase in carbohydrate intake, including rice and creamer in coffee. She has tried a 30-day diet in the past, which resulted in weight loss, but has since regained the weight.  The patient also reports dry lips, which she has been hydrating throughout the day with water. She has noticed an increase in urinary frequency, with urine having a red tinge. She denies any associated pain, fever, or chills. She also reports vaginal itching, but no other associated symptoms.  The patient has been experiencing constipation for the past few days, a change from her usual bowel habits of twice daily. She attributes this to an increase in sugar intake.  The patient also reports a healing surgical wound that has recently reopened in right breast.  She has seen a surgeon for this issue and has been managing it with antibiotic ointment and bandages. She denies any signs of infection such as redness, pus, or fever.  The patient is currently on Jardiance and has been managing her fatty liver disease with dietary changes. She has a history of liver biopsy and recent surgery.        Past Medical History:  Diagnosis Date   Anemia May 2022   Chemotherapy   Asthma    with respiratory infection   [facility]    breast cancer - right   Cataract March 2018   Optometrist visit   Diabetes mellitus without complication (HCC)    type II   Fatty liver    Fibroid uterus    GERD (gastroesophageal reflux disease) June 2021    Hypertension    Patient reports that she has never been diagnosied with HTN, "I take HCTZ for swelling in my feet, if needed."   Osteoarthritis 03/10/2015   PONV (postoperative nausea and vomiting)    2014   Refusal of blood transfusions as patient is Jehovah's Witness    NO BLOOD PRODUCTS   Sleep apnea     Past Surgical History:  Procedure Laterality Date   BREAST CYST ASPIRATION     BREAST EXCISIONAL BIOPSY     BREAST LUMPECTOMY WITH SENTINEL LYMPH NODE BIOPSY Right 05/21/2020   Procedure: RIGHT BREAST LUMPECTOMY WITH SENTINEL LYMPH NODE BIOPSY;  Surgeon: Almond Lint, MD;  Location: [facility] OR;  Service: General;  Laterality: Right;   CHOLECYSTECTOMY  02/2009   COLONOSCOPY     DILATATION & CURRETTAGE/HYSTEROSCOPY WITH RESECTOCOPE N/A 03/13/2014   Procedure: DILATATION & CURETTAGE/HYSTEROSCOPY ;  Surgeon: Annamaria Boots, MD;  Location: [facility];  Service: Gynecology;  Laterality: N/A;   PORTA CATH REMOVAL  01/2021   PORTACATH PLACEMENT Left 05/21/2020   Procedure: INSERTION PORT-A-CATH;  Surgeon: Almond Lint, MD;  Location: [facility] OR;  Service: General;  Laterality: Left;   RADIOACTIVE SEED GUIDED EXCISIONAL BREAST BIOPSY Left 12/30/2016   Procedure: RADIOACTIVE SEED GUIDED EXCISIONAL BREAST BIOPSY;  Surgeon: Almond Lint, MD;  Location: [facility];  Service: General;  Laterality: Left;    Family History  Problem Relation Age of Onset   Asthma Mother    Diabetes Mother    Diabetes Father    Heart attack Father    Heart disease Father    Heart Problems Sister    Asthma Sister    Hypertension Sister    Diabetes Brother    Diabetes Brother    Asthma Brother    Heart disease Maternal Grandfather     Social History   Socioeconomic History   Marital status: Married    Spouse name: Not on file   Number of children: Not on file   Years of education: Not on file   Highest education level: Not on file  Occupational History   Not on file  Tobacco Use    Smoking status: Never    Passive exposure: Never   Smokeless tobacco: Never  Vaping Use   Vaping status: Never Used  Substance and Sexual Activity   Alcohol use: No   Drug use: No   Sexual activity: Not Currently    Partners: Male    Birth control/protection: Post-menopausal  Other Topics Concern   Not on file  Social History Narrative   Not on file   Social Determinants of Health   Financial Resource Strain: Low Risk  (04/25/2020)   Overall Financial Resource Strain (CARDIA)    Difficulty of Paying Living Expenses: Not hard at all  Food Insecurity: No Food Insecurity (04/25/2020)   Hunger Vital Sign    Worried About Running Out of Food in the Last Year: Never true    Ran Out of Food in the Last Year: Never true  Transportation Needs: No Transportation Needs (04/25/2020)   PRAPARE - Administrator, Civil Service (Medical): No    Lack of Transportation (Non-Medical): No  Physical Activity: Not on file  Stress: Not on file  Social Connections: Unknown (04/25/2020)   Social Connection and Isolation Panel [NHANES]    Frequency of Communication with Friends and Family: Not on file    Frequency of Social Gatherings with Friends and Family: Not on file    Attends Religious Services: Not on file    Active Member of Clubs or Organizations: Not on file    Attends Banker Meetings: Not on file    Marital Status: Married  Intimate Partner Violence: Not on file    Outpatient Medications Prior to Visit  Medication Sig Dispense Refill   acetaminophen (TYLENOL) 500 MG tablet Take 500 mg by mouth every 6 (six) hours as needed for moderate pain.     albuterol (PROVENTIL HFA;VENTOLIN HFA) 108 (90 Base) MCG/ACT inhaler Inhale 1-2 puffs into the lungs every 4 (four) hours as needed for wheezing or shortness of breath. 18 g 2   albuterol (VENTOLIN HFA) 108 (90 Base) MCG/ACT inhaler Inhale 2 puffs into the lungs every 6 (six) hours as needed for wheezing or shortness of  breath. 8 g 0   anastrozole (ARIMIDEX) 1 MG tablet TAKE 1 TABLET BY MOUTH EVERY DAY 90 tablet 1   benzonatate (TESSALON) 100 MG capsule Take 1 capsule (100 mg total) by mouth 3 (three) times daily as needed. 30 capsule 0   CALCIUM-MAGNESIUM PO Take 30 mLs by mouth once a week.     Cholecalciferol 125 MCG (5000 UT) capsule Take 5,000 Units by mouth daily.     clotrimazole (LOTRIMIN) 1 % external solution Apply 1 Application topically 2 (two) times daily for 5 days. Right ear only 15 mL 0   empagliflozin (  JARDIANCE) 10 MG TABS tablet Take 1 tablet (10 mg total) by mouth daily. 90 tablet 3   glucose blood (ONETOUCH VERIO) test strip CHECK BLOOD GLUCOSE DAILY 75 strip 0   loratadine (CLARITIN) 10 MG tablet TAKE 1 TABLET(10 MG) BY MOUTH DAILY 90 tablet 3   loratadine (CLARITIN) 10 MG tablet Take 1 tablet (10 mg total) by mouth daily. 30 tablet 11   Menaquinone-7 (VITAMIN K2) 100 MCG CAPS Take 100 mcg by mouth daily.     metFORMIN (GLUCOPHAGE-XR) 500 MG 24 hr tablet Take 2 tablets (1,000 mg total) by mouth 2 (two) times daily with a meal. 360 tablet 3   Multiple Vitamin (MULTIVITAMIN) LIQD Take 30 mLs by mouth every other day.     Olopatadine HCl 0.6 % SOLN Place 2 sprays into both nostrils 2 (two) times daily as needed (allergies).     omeprazole (PRILOSEC) 40 MG capsule Take 1 capsule (40 mg total) by mouth daily. 30 capsule 2   Polyethylene Glycol 400 (BLINK TEARS OP) Place 1 drop into both eyes daily as needed (dry eyes).     triamcinolone ointment (KENALOG) 0.5 % Apply 1 Application topically 2 (two) times daily. 30 g 0   sodium chloride flush (NS) 0.9 % injection 10 mL      No facility-administered medications prior to visit.    Allergies  Allergen Reactions   Lisinopril Shortness Of Breath and Cough    wheezing    Review of Systems  Constitutional:  Negative for chills, fever and malaise/fatigue.  HENT:  Negative for congestion and hearing loss.   Eyes:  Negative for discharge.   Respiratory:  Negative for cough, sputum production and shortness of breath.   Cardiovascular:  Negative for chest pain, palpitations and leg swelling.  Gastrointestinal:  Negative for abdominal pain, blood in stool, constipation, diarrhea, heartburn, nausea and vomiting.  Genitourinary:  Positive for hematuria and urgency. Negative for dysuria and frequency.  Musculoskeletal:  Negative for back pain, falls and myalgias.  Skin:  Negative for rash.       Open sore on breast  Neurological:  Negative for dizziness, sensory change, loss of consciousness, weakness and headaches.  Endo/Heme/Allergies:  Negative for environmental allergies. Does not bruise/bleed easily.  Psychiatric/Behavioral:  Negative for depression and suicidal ideas. The patient is not nervous/anxious and does not have insomnia.        Objective:    Physical Exam Constitutional:      General: She is not in acute distress.    Appearance: Normal appearance. She is not diaphoretic.  HENT:     Head: Normocephalic and atraumatic.     Right Ear: Tympanic membrane, ear canal and external ear normal.     Left Ear: Tympanic membrane, ear canal and external ear normal.     Nose: Nose normal.     Mouth/Throat:     Mouth: Mucous membranes are moist.     Pharynx: Oropharynx is clear. No oropharyngeal exudate.  Eyes:     General: No scleral icterus.       Right eye: No discharge.        Left eye: No discharge.     Conjunctiva/sclera: Conjunctivae normal.     Pupils: Pupils are equal, round, and reactive to light.  Neck:     Thyroid: No thyromegaly.  Cardiovascular:     Rate and Rhythm: Normal rate and regular rhythm.     Heart sounds: Normal heart sounds. No murmur heard. Pulmonary:  Effort: Pulmonary effort is normal. No respiratory distress.     Breath sounds: Normal breath sounds. No wheezing or rales.  Abdominal:     General: Bowel sounds are normal. There is no distension.     Palpations: Abdomen is soft. There  is no mass.     Tenderness: There is no abdominal tenderness.  Musculoskeletal:        General: No tenderness. Normal range of motion.     Cervical back: Normal range of motion and neck supple.  Lymphadenopathy:     Cervical: No cervical adenopathy.  Skin:    General: Skin is warm and dry.     Findings: Lesion present. No rash.     Comments: Scar tissue in upper outer quadrant of right breast with open center and slight tan discharge noted.  Neurological:     General: No focal deficit present.     Mental Status: She is alert and oriented to person, place, and time.     Cranial Nerves: No cranial nerve deficit.     Coordination: Coordination normal.     Deep Tendon Reflexes: Reflexes are normal and symmetric. Reflexes normal.  Psychiatric:        Mood and Affect: Mood normal.        Behavior: Behavior normal.        Thought Content: Thought content normal.        Judgment: Judgment normal.     BP 130/80 (BP Location: Left Arm, Patient Position: Sitting, Cuff Size: Normal)   Pulse 71   Temp 98 F (36.7 C) (Oral)   Resp 16   Ht 5\' 3"  (1.6 m)   Wt 172 lb 9.6 oz (78.3 kg)   LMP 02/21/2014   SpO2 95%   BMI 30.57 kg/m  Wt Readings from Last 3 Encounters:  10/08/22 172 lb 9.6 oz (78.3 kg)  08/24/22 171 lb (77.6 kg)  04/30/22 171 lb 1.6 oz (77.6 kg)    Diabetic Foot Exam - Simple   No data filed    Lab Results  Component Value Date   WBC 8.5 10/08/2022   HGB 13.6 10/08/2022   HCT 43.7 10/08/2022   PLT 225.0 Repeated and verified X2. 10/08/2022   GLUCOSE 93 10/08/2022   CHOL 230 (H) 10/08/2022   TRIG 187.0 (H) 10/08/2022   HDL 62.10 10/08/2022   LDLDIRECT 101.0 10/17/2020   LDLCALC 131 (H) 10/08/2022   ALT 27 10/08/2022   AST 27 10/08/2022   NA 138 10/08/2022   K 4.4 10/08/2022   CL 100 10/08/2022   CREATININE 0.70 10/08/2022   BUN 11 10/08/2022   CO2 30 10/08/2022   TSH 1.81 10/08/2022   INR 0.9 02/12/2022   HGBA1C 6.6 (H) 10/08/2022   MICROALBUR <0.7  10/08/2022    Lab Results  Component Value Date   TSH 1.81 10/08/2022   Lab Results  Component Value Date   WBC 8.5 10/08/2022   HGB 13.6 10/08/2022   HCT 43.7 10/08/2022   MCV 91.6 10/08/2022   PLT 225.0 Repeated and verified X2. 10/08/2022   Lab Results  Component Value Date   NA 138 10/08/2022   K 4.4 10/08/2022   CO2 30 10/08/2022   GLUCOSE 93 10/08/2022   BUN 11 10/08/2022   CREATININE 0.70 10/08/2022   BILITOT 0.3 10/08/2022   ALKPHOS 178 (H) 10/08/2022   AST 27 10/08/2022   ALT 27 10/08/2022   PROT 7.8 10/08/2022   ALBUMIN 4.3 10/08/2022   CALCIUM 11.2 (H)  10/08/2022   ANIONGAP 6 04/14/2021   GFR 92.03 10/08/2022   Lab Results  Component Value Date   CHOL 230 (H) 10/08/2022   Lab Results  Component Value Date   HDL 62.10 10/08/2022   Lab Results  Component Value Date   LDLCALC 131 (H) 10/08/2022   Lab Results  Component Value Date   TRIG 187.0 (H) 10/08/2022   Lab Results  Component Value Date   CHOLHDL 4 10/08/2022   Lab Results  Component Value Date   HGBA1C 6.6 (H) 10/08/2022       Assessment & Plan:  Type 2 diabetes mellitus with obesity (HCC) Assessment & Plan: hgba1c acceptable, minimize simple carbs. Increase exercise as tolerated. Continue current meds   Orders: -     Lipid panel -     Comprehensive metabolic panel -     Hemoglobin A1c -     TSH -     Microalbumin / creatinine urine ratio  Preventative health care Assessment & Plan: Patient encouraged to maintain heart healthy diet, regular exercise, adequate sleep. Consider daily probiotics. Take medications as prescribed. Labs ordered and reviewed. Follows with OB/GYN, Dr Hyacinth Meeker for [facility] 08/2021 due now, paps 2022 repeat in 2025 to 2027, Dexa done 08/2021 repeat in 2026-28. Given and reviewed copy of ACP documents from [employer] and encouraged to complete and return. Colonoscopy 2021 repeat in 2026  Orders: -     Lipid panel -     TSH  Malignant neoplasm of  upper-outer quadrant of right breast in female, estrogen receptor positive (HCC) Assessment & Plan: Maintained Arimedex and following with oncology. She has had her incision open up over her right breast. She has been seen by her surgeon and for now they are treating it conservatively and it is healing slowly  Orders: -     CBC with Differential/Platelet  Hematuria, unspecified type Assessment & Plan: On Jardiance and some vaginal itching also noted. Has recently been on a course of Diflucan will give two more doses to take one weekly while we check a UA with culture. Hydrate well and report worsening symptoms.  Orders: -     Urinalysis, Routine w reflex microscopic -     Urine Culture  Itching in the vaginal area  Class 1 obesity in adult, unspecified BMI, unspecified obesity type, unspecified whether serious comorbidity present -     Amb Ref to [facility] Weight [facility]  Hyperlipidemia, unspecified hyperlipidemia type Assessment & Plan: Encourage heart healthy diet such as MIND or DASH diet, increase exercise, avoid trans fats, simple carbohydrates and processed foods, consider a krill or fish or flaxseed oil cap daily.  Cholesterol is up some have recommended starting a statin   Essential hypertension Assessment & Plan: Well controlled, no changes to meds. Encouraged heart healthy diet such as the DASH diet and exercise as tolerated.     Atypical lobular hyperplasia [facility]) of left breast  Hepatic steatosis determined by biopsy of liver Assessment & Plan: Grade 3 in 01/2022 determined by biopsy  Minimize processed foods and simple carbohydrates. Increase activity and patient is referred to [facility] for attempts at persistent weight loss   Hypercalcemia Assessment & Plan: Minimize calcium intake for now and check PTH and Vitamin d   Elevated alkaline phosphatase level Assessment & Plan: Will recheck isoenymes and monitor. Likely related to her open lesion in her  breast   Other orders -     Fluconazole; Take 1 tablet (150 mg total)  by mouth once a week.  Dispense: 2 tablet; Refill: 1    Assessment and Plan    Nonalcoholic Steatohepatitis (NASH) Grade 3 fatty liver confirmed on biopsy in December 2023. Recent weight gain from 161 to 173 lbs, BMI 30. Discussed the importance of weight loss and maintaining a healthy diet to manage NASH. -Refer to [facility] for bariatric medicine consultation. -Encourage hydration, stress management, and a minimum of 4000 steps per day.  Possible Urinary Tract Infection (UTI) Reports increased urinary frequency and red-tinged urine for the past few days. No fevers, chills, abdominal or back pain. -Collect urine sample for urinalysis and culture. -Consider Diflucan for possible yeast infection secondary to Jardiance use.  Constipation Reports decreased bowel movements from twice daily to every 2-3 days. No abdominal pain. -Encourage increased water intake, exercise, and consider a fiber supplement like Benefiber. -Recommend milk of magnesia and prune juice regimen if constipation persists.  Post-Surgical Wound Care Reports recurrent opening of surgical wound. No signs of infection observed on examination. -Continue current wound care regimen. -Consider referral to plastic surgery for possible debridement and skin flap if wound continues to reopen.  General Health Maintenance -Ensure up-to-date mammogram. -Check liver function tests and creatinine ratio. -Schedule follow-up appointment in 6 months.         Danise Edge, MD

## 2022-10-09 NOTE — Assessment & Plan Note (Signed)
Minimize calcium intake for now and check PTH and Vitamin d

## 2022-10-09 NOTE — Assessment & Plan Note (Addendum)
Grade 3 in 01/2022 determined by biopsy  Minimize processed foods and simple carbohydrates. Increase activity and patient is referred to [facility] for attempts at persistent weight loss

## 2022-10-09 NOTE — Assessment & Plan Note (Signed)
On Jardiance and some vaginal itching also noted. Has recently been on a course of Diflucan will give two more doses to take one weekly while we check a UA with culture. Hydrate well and report worsening symptoms.

## 2022-10-09 NOTE — Assessment & Plan Note (Signed)
Encourage heart healthy diet such as MIND or DASH diet, increase exercise, avoid trans fats, simple carbohydrates and processed foods, consider a krill or fish or flaxseed oil cap daily.  Cholesterol is up some have recommended starting a statin

## 2022-10-09 NOTE — Assessment & Plan Note (Deleted)
She has had her incision open up over her left breast. She has been seen by her surgeon and for now they are treating it conservatively and it is healing slowly

## 2022-10-12 ENCOUNTER — Other Ambulatory Visit: Payer: Self-pay

## 2022-10-12 DIAGNOSIS — R748 Abnormal levels of other serum enzymes: Secondary | ICD-10-CM

## 2022-10-12 DIAGNOSIS — E559 Vitamin D deficiency, unspecified: Secondary | ICD-10-CM

## 2022-10-13 ENCOUNTER — Other Ambulatory Visit (INDEPENDENT_AMBULATORY_CARE_PROVIDER_SITE_OTHER): Payer: 59

## 2022-10-13 DIAGNOSIS — E559 Vitamin D deficiency, unspecified: Secondary | ICD-10-CM

## 2022-10-13 DIAGNOSIS — R748 Abnormal levels of other serum enzymes: Secondary | ICD-10-CM

## 2022-10-13 LAB — VITAMIN D 25 HYDROXY (VIT D DEFICIENCY, FRACTURES): VITD: 51.69 ng/mL (ref 30.00–100.00)

## 2022-10-13 LAB — ALKALINE PHOSPHATASE: Alkaline Phosphatase: 166 U/L — ABNORMAL HIGH (ref 39–117)

## 2022-10-16 LAB — HM DIABETES EYE EXAM

## 2022-10-26 NOTE — Progress Notes (Unsigned)
Name: Yolanda [name]  MRN/ DOB: [id number], 1960-12-02   Age/ Sex: 62 y.o., female    PCP: Bradd Canary, MD   Reason for Endocrinology Evaluation: Type 2 Diabetes Mellitus     Date of Initial Endocrinology Visit: 08/20/2020    PATIENT IDENTIFIER: [name] is a 62 y.o. female with a past medical history of T2DM, Asthma, Breast ca and hx of Fatty liver. The patient presented for initial endocrinology clinic visit on 08/20/2020 for consultative assistance with her diabetes management.    Pt on adjuvant therapy for right Breast ca started 06/2020 ( cyclophosphamide, doxorubicin with pegfilgrastim on day 3 )but  she started weekly paclitaxel 08/21/2020.   DIABETIC HISTORY:  [name] was diagnosed with DM in 2015. She has been on metformin only since her diagnosis.  Her hemoglobin A1c has ranged from 6.9% in 2019, peaking at 7.5% in 2020.   On her initial visit to our clinic she had an A1c of 7.4%, she was on metformin only, she has noted hyperglycemia during chemotherapy, we started Jardiance at the time.   SUBJECTIVE:   During the last visit (04/24/2022): A1c 6.2%    Today (12/24/2020): [name] is here for follow-up on diabetes management.   Since her last visit here she has been diagnosed with NASH  based on a liver biopsy 02/2022    she checks her blood sugars 1 times daily.   She continues to follow up with oncology for breast CA,completed  chemo 11/13/2020.  Completed radiation 03/2021. started anastrozole 03/2021   HOME DIABETES REGIMEN: Metformin 500 mg XR 2 tabs BID  Jardiance 10 mg daily  Statin: yes ACE-I/ARB: no- Allergic to lisinopril  Prior Diabetic Education: no   METER DOWNLOAD SUMMARY:  112-141 mg/dL    DIABETIC COMPLICATIONS: Microvascular complications:   Denies: CKD, neuropathy, retinopathy  Last eye exam: Completed 09/16/2020  Macrovascular complications:   Denies: CAD, PVD, CVA   PAST HISTORY: Past Medical History:  Past  Medical History:  Diagnosis Date   Anemia May 2022   Chemotherapy   Asthma    with respiratory infection   Cancer (HCC)    breast cancer - right   Cataract March 2018   Optometrist visit   Diabetes mellitus without complication (HCC)    type II   Fatty liver    Fibroid uterus    GERD (gastroesophageal reflux disease) June 2021   Hypertension    Patient reports that she has never been diagnosied with HTN, "I take HCTZ for swelling in my feet, if needed."   Osteoarthritis 03/10/2015   PONV (postoperative nausea and vomiting)    2014   Refusal of blood transfusions as patient is Jehov[employer]'s Witness    NO BLOOD PRODUCTS   Sleep apnea    Past Surgical History:  Past Surgical History:  Procedure Laterality Date   BREAST CYST ASPIRATION     BREAST EXCISIONAL BIOPSY     BREAST LUMPECTOMY WITH SENTINEL LYMPH NODE BIOPSY Right 05/21/2020   Procedure: RIGHT BREAST LUMPECTOMY WITH SENTINEL LYMPH NODE BIOPSY;  Surgeon: Almond Lint, MD;  Location: [facility] OR;  Service: General;  Laterality: Right;   CHOLECYSTECTOMY  02/2009   COLONOSCOPY     DILATATION & CURRETTAGE/HYSTEROSCOPY WITH RESECTOCOPE N/A 03/13/2014   Procedure: DILATATION & CURETTAGE/HYSTEROSCOPY ;  Surgeon: Annamaria Boots, MD;  Location: [facility];  Service: Gynecology;  Laterality: N/A;   PORTA CATH REMOVAL  01/2021   PORTACATH PLACEMENT Left 05/21/2020   Procedure: INSERTION  PORT-A-CATH;  Surgeon: Almond Lint, MD;  Location: [facility] OR;  Service: General;  Laterality: Left;   RADIOACTIVE SEED GUIDED EXCISIONAL BREAST BIOPSY Left 12/30/2016   Procedure: RADIOACTIVE SEED GUIDED EXCISIONAL BREAST BIOPSY;  Surgeon: Almond Lint, MD;  Location: [facility];  Service: General;  Laterality: Left;    Social History:  reports that she has never smoked. She has never been exposed to tobacco smoke. She has never used smokeless tobacco. She reports that she does not drink alcohol and does not use drugs. Family History:  Family  History  Problem Relation Age of Onset   Asthma Mother    Diabetes Mother    Diabetes Father    Heart attack Father    Heart disease Father    Heart Problems Sister    Asthma Sister    Hypertension Sister    Diabetes Brother    Diabetes Brother    Asthma Brother    Heart disease Maternal Grandfather      HOME MEDICATIONS: Allergies as of 10/27/2022       Reactions   Lisinopril Shortness Of Breath, Cough   wheezing        Medication List        Accurate as of October 26, 2022  1:42 PM. If you have any questions, ask your nurse or doctor.          acetaminophen 500 MG tablet Commonly known as: TYLENOL Take 500 mg by mouth every 6 (six) hours as needed for moderate pain.   albuterol 108 (90 Base) MCG/ACT inhaler Commonly known as: VENTOLIN HFA Inhale 1-2 puffs into the lungs every 4 (four) hours as needed for wheezing or shortness of breath.   albuterol 108 (90 Base) MCG/ACT inhaler Commonly known as: VENTOLIN HFA Inhale 2 puffs into the lungs every 6 (six) hours as needed for wheezing or shortness of breath.   anastrozole 1 MG tablet Commonly known as: ARIMIDEX TAKE 1 TABLET BY MOUTH EVERY DAY   benzonatate 100 MG capsule Commonly known as: TESSALON Take 1 capsule (100 mg total) by mouth 3 (three) times daily as needed.   BLINK TEARS OP Place 1 drop into both eyes daily as needed (dry eyes).   CALCIUM-MAGNESIUM PO Take 30 mLs by mouth once a week.   Cholecalciferol 125 MCG (5000 UT) capsule Take 5,000 Units by mouth daily.   empagliflozin 10 MG Tabs tablet Commonly known as: Jardiance Take 1 tablet (10 mg total) by mouth daily.   fluconazole 150 MG tablet Commonly known as: Diflucan Take 1 tablet (150 mg total) by mouth once a week.   loratadine 10 MG tablet Commonly known as: CLARITIN TAKE 1 TABLET(10 MG) BY MOUTH DAILY   loratadine 10 MG tablet Commonly known as: CLARITIN Take 1 tablet (10 mg total) by mouth daily.   metFORMIN 500 MG  24 hr tablet Commonly known as: GLUCOPHAGE-XR Take 2 tablets (1,000 mg total) by mouth 2 (two) times daily with a meal.   multivitamin Liqd Take 30 mLs by mouth every other day.   Olopatadine HCl 0.6 % Soln Place 2 sprays into both nostrils 2 (two) times daily as needed (allergies).   omeprazole 40 MG capsule Commonly known as: PRILOSEC Take 1 capsule (40 mg total) by mouth daily.   OneTouch Verio test strip Generic drug: glucose blood CHECK BLOOD GLUCOSE DAILY   triamcinolone ointment 0.5 % Commonly known as: KENALOG Apply 1 Application topically 2 (two) times daily.   Vitamin K2 100 MCG  Caps Take 100 mcg by mouth daily.         ALLERGIES: Allergies  Allergen Reactions   Lisinopril Shortness Of Breath and Cough    wheezing     REVIEW OF SYSTEMS: A comprehensive ROS was conducted with the patient and is negative except as per HPI     OBJECTIVE:   VITAL SIGNS: LMP 02/21/2014    PHYSICAL EXAM:  General: Pt appears well and is in NAD  Lungs: Clear with good BS bilat   Heart: RRR   Abdomen: soft, nontender  Extremities:  Lower extremities - No pretibial edema.  Neuro: MS is good with appropriate affect, pt is alert and Ox3    DM foot exam: 04/24/2022  The skin of the feet is intact without sores or ulcerations. The pedal pulses are 2+ on right and 2+ on left. The sensation is intact to a screening 5.07, 10 gram monofilament bilaterally   DATA REVIEWED:  Lab Results  Component Value Date   HGBA1C 6.6 (H) 10/08/2022   HGBA1C 6.2 (A) 04/24/2022   HGBA1C 6.9 (H) 01/05/2022   Lab Results  Component Value Date   MICROALBUR <0.7 10/08/2022   LDLCALC 131 (H) 10/08/2022   CREATININE 0.70 10/08/2022   Lab Results  Component Value Date   MICRALBCREAT 5.6 10/08/2022    Lab Results  Component Value Date   CHOL 230 (H) 10/08/2022   HDL 62.10 10/08/2022   LDLCALC 131 (H) 10/08/2022   LDLDIRECT 101.0 10/17/2020   TRIG 187.0 (H) 10/08/2022   CHOLHDL 4  10/08/2022        Latest Reference Range & Units 01/05/22 09:26  COMPREHENSIVE METABOLIC PANEL  Rpt !  Sodium 135 - 145 mEq/L 139  Potassium 3.5 - 5.1 mEq/L 4.4  Chloride 96 - 112 mEq/L 102  CO2 19 - 32 mEq/L 28  Glucose 70 - 99 mg/dL 132 (H)  BUN 6 - 23 mg/dL 13  Creatinine 4.40 - 1.02 mg/dL 7.25  Calcium 8.4 - 36.6 mg/dL 44.0  Alkaline Phosphatase 39 - 117 U/L 141 (H)  Albumin 3.5 - 5.2 g/dL 4.2  AST 0 - 37 U/L 20  ALT 0 - 35 U/L 20  Total Protein 6.0 - 8.3 g/dL 6.7  Total Bilirubin 0.2 - 1.2 mg/dL 0.3  GFR >34.74 mL/min 95.27   Old records , labs and images have been reviewed.    ASSESSMENT / PLAN / RECOMMENDATIONS:   1) Type 2 Diabetes Mellitus, Optimally controlled, Without complications - Most recent A1c of 6.2 %. Goal A1c < 7.0 %.    -Patient continues with optimal glucose control with just metformin, she has not been here in a year and has not been on Jardiance as per patient she did not notice a difference in her glucose control -We briefly discussed CGM as her husband is on it, since she is not on insulin and does not require frequent monitoring we have opted to hold off on this -I did encourage the patient to consider Jardiance due to the cardiovascular and renal benefits as well as weight loss benefits   MEDICATIONS: Restart Jardiance 10 mg , 1 tablet in the morning  Continue Metformin 500 mg, 2 tablets with Breakfast and 2 tablet with Supper    EDUCATION / INSTRUCTIONS: BG monitoring instructions: Patient is instructed to check her blood sugars 1 times a day, fasting . Call [facility] if: BG persistently < 70  I reviewed the Rule of 15 for the treatment of hypoglycemia  in detail with the patient. Literature supplied.   2) Diabetic complications:  Eye: Does not have known diabetic retinopathy.  Neuro/ Feet: Does not have known diabetic peripheral neuropathy. Renal: Patient does not have known baseline CKD. She is not on an ACEI/ARB at  present.  3) NASH:   - S/P liver bx 01/2022  - Follows with Dr. Loreta Ave - She was advised to lose weight  - We discussed Glp-1 a vs SGLT-2i, GLP-1 agonist did not appeal to her as she understands she will need to be on them long-term because if she will discontinue her appetite will return in the weight gain will recur -I have strongly encouraged her to follow a certain diet such as Noom or weight watchers -She already exercises an average of 240 minutes a week, which I have encouraged her to continue   F/U in 6 months     Signed electronically by: Lyndle Herrlich, MD  [facility]  [facility] [address] [place] [place], [place] [address] Phone: [phone] FAX: [phone]   CC: Bradd Canary, MD [address] [place] [address] Phone: [phone]  Fax: [phone]    Return to Endocrinology clinic as below: Future Appointments  Date Time Provider Department Center  10/27/2022 11:50 AM Duchess Armendarez, Konrad Dolores, MD [facility] None  11/30/2022  9:45 AM Rachel Moulds, MD [facility] None  12/29/2022  9:40 AM Pollyann Savoy, MD [facility] None  02/01/2023  8:15 AM Jerene Bears, MD [facility]  04/08/2023  9:40 AM Bradd Canary, MD [facility]  10/04/2023  8:30 AM Ashok Croon, MD [facility] None

## 2022-10-27 ENCOUNTER — Ambulatory Visit (INDEPENDENT_AMBULATORY_CARE_PROVIDER_SITE_OTHER): Payer: 59 | Admitting: Internal Medicine

## 2022-10-27 ENCOUNTER — Encounter: Payer: Self-pay | Admitting: Internal Medicine

## 2022-10-27 ENCOUNTER — Telehealth: Payer: Self-pay

## 2022-10-27 VITALS — BP 140/85 | Ht 63.0 in | Wt 172.6 lb

## 2022-10-27 DIAGNOSIS — E785 Hyperlipidemia, unspecified: Secondary | ICD-10-CM

## 2022-10-27 DIAGNOSIS — E119 Type 2 diabetes mellitus without complications: Secondary | ICD-10-CM | POA: Diagnosis not present

## 2022-10-27 DIAGNOSIS — Z7984 Long term (current) use of oral hypoglycemic drugs: Secondary | ICD-10-CM | POA: Diagnosis not present

## 2022-10-27 DIAGNOSIS — K7581 Nonalcoholic steatohepatitis (NASH): Secondary | ICD-10-CM

## 2022-10-27 MED ORDER — SEMAGLUTIDE(0.25 OR 0.5MG/DOS) 2 MG/3ML ~~LOC~~ SOPN: 0.5000 mg | PEN_INJECTOR | SUBCUTANEOUS | 3 refills | Status: DC

## 2022-10-27 MED ORDER — METFORMIN HCL ER 500 MG PO TB24: 1000.0000 mg | ORAL_TABLET | Freq: Two times a day (BID) | ORAL | 3 refills | Status: DC

## 2022-10-27 NOTE — Telephone Encounter (Signed)
Medication Samples have been provided to the patient.  Drug name: Ozempic       Strength: 0.25mg         Qty: 1  LOT: [id number]  Exp.Date: 12/17/2023  Dosing instructions: Inject 0.25MG   once a week   The patient has been instructed regarding the correct time, dose, and frequency of taking this medication, including desired effects and most common side effects.   Berklee Battey L Domnic Vantol 2:02 PM 10/27/2022

## 2022-10-27 NOTE — Patient Instructions (Addendum)
-   Start Ozempic 0.25 mg once weekly for 6 weeks, then increase to 0.5 mg weekly - Continue Metformin 500 mg, 2 tablets with Breakfast and 2 tablet with Supper  -Stop Jardiance   HOW TO TREAT LOW BLOOD SUGARS (Blood sugar LESS THAN 70 MG/DL) Please follow the RULE OF 15 for the treatment of hypoglycemia treatment (when your (blood sugars are less than 70 mg/dL)   STEP 1: Take 15 grams of carbohydrates when your blood sugar is low, which includes:  3-4 GLUCOSE TABS  OR 3-4 OZ OF JUICE OR REGULAR SODA OR ONE TUBE OF GLUCOSE GEL    STEP 2: RECHECK blood sugar in 15 MINUTES STEP 3: If your blood sugar is still low at the 15 minute recheck --> then, go back to STEP 1 and treat AGAIN with another 15 grams of carbohydrates.

## 2022-11-12 ENCOUNTER — Telehealth: Payer: Self-pay | Admitting: *Deleted

## 2022-11-12 NOTE — Telephone Encounter (Signed)
This RN returned VM left by pt stating she has an area on her surgical breast that has opened and is now oozing blood and pus.  This RN returned call - and inquired if she has contacted the surgeon's office. She states she has been back to them 2 times for this issue with pin hole opening with some oozing " I was told this is normal after my surgery- but today it is now open more (1/2 inch) and oozing blood and pus"  "I need you to tell me what direction I need to go in "  This RN informed her to call the surgeon's as soon as this call is ended due to area is surgical related- pt is not under any immunosuppressive therapy.  Pt verbalized understanding and will do as above.

## 2022-11-14 ENCOUNTER — Other Ambulatory Visit: Payer: Self-pay | Admitting: General Surgery

## 2022-11-14 DIAGNOSIS — N611 Abscess of the breast and nipple: Secondary | ICD-10-CM

## 2022-11-17 ENCOUNTER — Ambulatory Visit
Admission: RE | Admit: 2022-11-17 | Discharge: 2022-11-17 | Disposition: A | Discharge status: Home / Self Care | Payer: 59 | Source: Ambulatory Visit | Attending: General Surgery | Admitting: General Surgery

## 2022-11-17 ENCOUNTER — Other Ambulatory Visit: Payer: Self-pay | Admitting: General Surgery

## 2022-11-17 ENCOUNTER — Inpatient Hospital Stay
Admission: RE | Admit: 2022-11-17 | Discharge: 2022-11-17 | Discharge status: Home / Self Care | Payer: 59 | Source: Ambulatory Visit | Attending: General Surgery | Admitting: General Surgery

## 2022-11-17 ENCOUNTER — Encounter: Payer: Self-pay | Admitting: General Surgery

## 2022-11-17 DIAGNOSIS — N611 Abscess of the breast and nipple: Secondary | ICD-10-CM

## 2022-11-17 HISTORY — PX: BREAST BIOPSY: SHX20

## 2022-11-18 LAB — SURGICAL PATHOLOGY

## 2022-11-27 ENCOUNTER — Other Ambulatory Visit: Payer: Self-pay | Admitting: Internal Medicine

## 2022-11-30 ENCOUNTER — Inpatient Hospital Stay: Payer: 59 | Attending: Hematology and Oncology | Admitting: Hematology and Oncology

## 2022-11-30 VITALS — BP 145/84 | HR 91 | Temp 97.3°F | Resp 17 | Ht 63.0 in | Wt 167.2 lb

## 2022-11-30 DIAGNOSIS — K760 Fatty (change of) liver, not elsewhere classified: Secondary | ICD-10-CM | POA: Diagnosis not present

## 2022-11-30 DIAGNOSIS — Z170 Estrogen receptor positive status [ER+]: Secondary | ICD-10-CM | POA: Insufficient documentation

## 2022-11-30 DIAGNOSIS — C50411 Malignant neoplasm of upper-outer quadrant of right female breast: Secondary | ICD-10-CM

## 2022-11-30 DIAGNOSIS — Z79811 Long term (current) use of aromatase inhibitors: Secondary | ICD-10-CM | POA: Diagnosis not present

## 2022-11-30 NOTE — Progress Notes (Signed)
[facility]  [phone] [phone]     ID: Yolanda [name] DOB: 1960-06-13  MR#: [id number]  UXL#:[id number]  Patient Care Team: Bradd Canary, MD as PCP - General (Family Medicine) Cliffton Asters, MD (Inactive) as Consulting Physician (Infectious Diseases) Jerene Bears, MD as Consulting Physician (Gynecology) Magrinat, Valentino Hue, MD (Inactive) as Consulting Physician (Oncology) Charna Elizabeth, MD as Consulting Physician (Gastroenterology) Almond Lint, MD as Consulting Physician (General Surgery) Pershing Proud, RN as Oncology Nurse Navigator Donnelly Angelica, RN as Oncology Nurse Navigator Hurshel Party, OD (Optometry) Rachel Moulds, MD  CHIEF COMPLAINT: Estrogen receptor positive breast cancer  CURRENT TREATMENT: Anastrazole.  INTERVAL HISTORY:  reeda hollers since her last visit here she had mammogram ultrasound and biopsy of the right breast 12 o'clock position 4 cm from the nipple which showed benign breast changes with acute and chronic inflammation urns today for follow up and treatment of her estrogen receptor positive breast cancer. She had her last follow-up with Dr. Donell Beers on September 27, recently completed antibiotics once again for right breast abscess. She is also on semaglutide for fatty liver, she lost about 5 lbs in 6 weeks. Other than the nonhealing wound in the right breast, she is doing quite well. Rest of the pertinent 10 point ROS reviewed and negative  REVIEW OF SYSTEMS: A detailed review of systems was otherwise stable.   COVID 19 VACCINATION STATUS: fully vaccinated [employer]), with booster 12/2019   HISTORY OF CURRENT ILLNESS: From the original intake note:   I saw Yolanda [name] in December 2018 for evaluation of atypical lobular hyperplasia.  We discussed various strategies regarding breast cancer prevention at that time for her to consider.    More recently (around the time of the Super Bowl 2022) she found  a lump in her right breast.  She underwent bilateral diagnostic mammography with tomography and right breast ultrasonography at [facility] on 04/10/2020 showing: breast density category C; palpable 3 cm mass in right breast at 12 o'clock; no enlarged adenopathy in right axilla.  Accordingly on the same day, she proceeded to biopsy of the right breast area in question. The pathology from this procedure ([id number]) showed: invasive ductal carcinoma, grade 3; ductal carcinoma in situ. Prognostic indicators significant for: estrogen receptor, 90% positive with moderate staining intensity and progesterone receptor, 0% negative. Proliferation marker Ki67 at 25%. HER2 equivocal by immunohistochemistry (2+), but negative by fluorescent in situ hybridization with a signals ratio 1.49 and number per cell 2.75.   Cancer Staging  Malignant neoplasm of upper-outer quadrant of right breast in female, estrogen receptor positive (HCC) Staging form: Breast, AJCC 8th Edition - Clinical: Stage IIA (cT2, cN0, cM0, G3, ER+, PR+, HER2-) - Signed by Lowella Dell, MD on 04/18/2020   The patient's subsequent history is as detailed below.   PAST MEDICAL HISTORY: Past Medical History:  Diagnosis Date   Anemia May 2022   Chemotherapy   Asthma    with respiratory infection   Cancer [facility])    breast cancer - right   Cataract March 2018   Optometrist visit   Diabetes mellitus without complication (HCC)    type II   Fatty liver    Fibroid uterus    GERD (gastroesophageal reflux disease) June 2021   Hypertension    Patient reports that she has never been diagnosied with HTN, "I take HCTZ for swelling in my feet, if needed."   Osteoarthritis 03/10/2015   PONV (postoperative nausea  and vomiting)    2014   Refusal of blood transfusions as patient is Jehovah's Witness    NO BLOOD PRODUCTS   Sleep apnea     PAST SURGICAL HISTORY: Past Surgical History:  Procedure Laterality Date   BREAST BIOPSY Right  11/17/2022   [place] RT BREAST BX W LOC DEV 1ST LESION IMG BX SPEC US GUIDE 11/17/2022 [facility] MAMMO[facility]   BREAST CYST ASPIRATION     BREAST EXCISIONAL BIOPSY     BREAST LUMPECTOMY WITH SENTINEL LYMPH NODE BIOPSY Right 05/21/2020   Procedure: RIGHT BREAST LUMPECTOMY WITH SENTINEL LYMPH NODE BIOPSY;  Surgeon: Almond Lint, MD;  Location: [facility] OR;  Service: General;  Laterality: Right;   CHOLECYSTECTOMY  02/2009   COLONOSCOPY     DILATATION & CURRETTAGE/HYSTEROSCOPY WITH RESECTOCOPE N/A 03/13/2014   Procedure: DILATATION & CURETTAGE/HYSTEROSCOPY ;  Surgeon: Annamaria Boots, MD;  Location: [facility];  Service: Gynecology;  Laterality: N/A;   PORTA CATH REMOVAL  01/2021   PORTACATH PLACEMENT Left 05/21/2020   Procedure: INSERTION PORT-A-CATH;  Surgeon: Almond Lint, MD;  Location: [facility] OR;  Service: General;  Laterality: Left;   RADIOACTIVE SEED GUIDED EXCISIONAL BREAST BIOPSY Left 12/30/2016   Procedure: RADIOACTIVE SEED GUIDED EXCISIONAL BREAST BIOPSY;  Surgeon: Almond Lint, MD;  Location: [facility];  Service: General;  Laterality: Left;    FAMILY HISTORY: Family History  Problem Relation Age of Onset   Asthma Mother    Diabetes Mother    Diabetes Father    Heart attack Father    Heart disease Father    Heart Problems Sister    Asthma Sister    Hypertension Sister    Diabetes Brother    Diabetes Brother    Asthma Brother    Heart disease Maternal Grandfather   The patient's father died from heart disease at the age of 23.  The patient's mother is 67 years old as of March 2022.  The patient has 4 brothers and 2 sisters.  She is not aware of any history of cancer in her family.   GYNECOLOGIC HISTORY:  Patient's last menstrual period was 02/21/2014. Menarche: 62 years old GX P 0 LMP age 60 Contraceptive 1 year, no complications HRT no  Hysterectomy? no BSO? 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  Her husband Yolanda [name] works  as a Systems developer for [employer].  He has 2 children from a prior marriage, both living in [place].  One is traveling nurse.  The other 1 is a Naval architect.  The patient is a Scientist, product/process development.    ADVANCED DIRECTIVES: In the absence of any documentation to the contrary, the patient's spouse is their HCPOA.  The patient made it clear at her visit 04/18/2020 that she would not accept any blood transfusion or blood products for any reason including to stave off death   HEALTH MAINTENANCE: Social History   Tobacco Use   Smoking status: Never    Passive exposure: Never   Smokeless tobacco: Never  Vaping Use   Vaping status: Never Used  Substance Use Topics   Alcohol use: No   Drug use: No     Colonoscopy: 10/2019 (Dr. Loreta Ave), recall 2026  PAP: 03/2017, negative  Bone density: 09/2018, -0.8   Allergies  Allergen Reactions   Lisinopril Shortness Of Breath and Cough    wheezing    Current Outpatient Medications  Medication Sig Dispense Refill   acetaminophen (TYLENOL) 500 MG tablet Take  500 mg by mouth every 6 (six) hours as needed for moderate pain.     albuterol (PROVENTIL HFA;VENTOLIN HFA) 108 (90 Base) MCG/ACT inhaler Inhale 1-2 puffs into the lungs every 4 (four) hours as needed for wheezing or shortness of breath. 18 g 2   albuterol (VENTOLIN HFA) 108 (90 Base) MCG/ACT inhaler Inhale 2 puffs into the lungs every 6 (six) hours as needed for wheezing or shortness of breath. 8 g 0   anastrozole (ARIMIDEX) 1 MG tablet TAKE 1 TABLET BY MOUTH EVERY DAY 90 tablet 1   benzonatate (TESSALON) 100 MG capsule Take 1 capsule (100 mg total) by mouth 3 (three) times daily as needed. (Patient not taking: Reported on 10/27/2022) 30 capsule 0   CALCIUM-MAGNESIUM PO Take 30 mLs by mouth once a week.     Cholecalciferol 125 MCG (5000 UT) capsule Take 5,000 Units by mouth daily.     fluconazole (DIFLUCAN) 150 MG tablet Take 1 tablet (150 mg total) by mouth once a week. (Patient not taking: Reported on  10/27/2022) 2 tablet 1   glucose blood (ONETOUCH VERIO) test strip CHECK BLOOD SUGAR DAILY 100 strip 4   loratadine (CLARITIN) 10 MG tablet TAKE 1 TABLET(10 MG) BY MOUTH DAILY 90 tablet 3   loratadine (CLARITIN) 10 MG tablet Take 1 tablet (10 mg total) by mouth daily. 30 tablet 11   Menaquinone-7 (VITAMIN K2) 100 MCG CAPS Take 100 mcg by mouth daily.     metFORMIN (GLUCOPHAGE-XR) 500 MG 24 hr tablet Take 2 tablets (1,000 mg total) by mouth 2 (two) times daily with a meal. 360 tablet 3   Multiple Vitamin (MULTIVITAMIN) LIQD Take 30 mLs by mouth every other day.     Olopatadine HCl 0.6 % SOLN Place 2 sprays into both nostrils 2 (two) times daily as needed (allergies).     omeprazole (PRILOSEC) 40 MG capsule Take 1 capsule (40 mg total) by mouth daily. 30 capsule 2   Polyethylene Glycol 400 (BLINK TEARS OP) Place 1 drop into both eyes daily as needed (dry eyes).     Semaglutide,0.25 or 0.5MG /DOS, 2 MG/3ML SOPN Inject 0.5 mg into the skin once a week. 9 mL 3   triamcinolone ointment (KENALOG) 0.5 % Apply 1 Application topically 2 (two) times daily. 30 g 0   No current facility-administered medications for this visit.   SUPPLEMENTS: Aside from the medications listed above, the patient takes a teaspoon of olive oil daily, 1000 mg of vitamin C every other day, sunflower lecithin 600 mg every other day, vitamin D3 5000 mg every other day, magnesium 200 mg every other day, milk thistle 150 mg every other day, a B complex vitamin (B6 and B12) every other day, as well as Osteo Bi-Flex weekly and fluticasone as needed   OBJECTIVE:  Vitals:   11/30/22 0957  BP: (!) 145/84  Pulse: 91  Resp: 17  Temp: (!) 97.3 F (36.3 C)  SpO2: 98%       Body mass index is 29.62 kg/m.   Wt Readings from Last 3 Encounters:  11/30/22 167 lb 3.2 oz (75.8 kg)  10/27/22 172 lb 9.6 oz (78.3 kg)  10/08/22 172 lb 9.6 oz (78.3 kg)  ECOG FS:1 - Symptomatic but completely ambulatory  Physical Exam Constitutional:       Appearance: Normal appearance.  Chest:     Comments: Bilateral breasts examined.  In the right breast, there is a small area of wound dehiscence with no clear purulence.  Postradiation changes noted.  She has large and pendulous breasts, no obvious palpable masses in both breast.  No regional adenopathy. Musculoskeletal:     Cervical back: Normal range of motion and neck supple. No rigidity.  Lymphadenopathy:     Cervical: No cervical adenopathy.  Neurological:     Mental Status: She is alert.      LAB RESULTS:  CMP     Component Value Date/Time   NA 138 10/08/2022 1615   NA 138 11/04/2017 0000   K 4.4 10/08/2022 1615   CL 100 10/08/2022 1615   CO2 30 10/08/2022 1615   GLUCOSE 93 10/08/2022 1615   BUN 11 10/08/2022 1615   BUN 14 11/04/2017 0000   CREATININE 0.70 10/08/2022 1615   CREATININE 0.63 01/15/2021 0943   CREATININE 0.60 09/07/2014 0950   CALCIUM 11.2 (H) 10/08/2022 1615   CALCIUM 9.7 10/02/2020 1030   PROT 7.8 10/08/2022 1615   ALBUMIN 4.3 10/08/2022 1615   AST 27 10/08/2022 1615   AST 26 01/15/2021 0943   ALT 27 10/08/2022 1615   ALT 27 01/15/2021 0943   ALKPHOS 166 (H) 10/13/2022 0918   BILITOT 0.3 10/08/2022 1615   BILITOT 0.5 01/15/2021 0943   GFRNONAA >60 04/14/2021 0816   GFRNONAA >60 01/15/2021 0943   GFRAA >60 05/15/2019 1803    No results found for: "TOTALPROTELP", "ALBUMINELP", "A1GS", "A2GS", "BETS", "BETA2SER", "GAMS", "MSPIKE", "SPEI"  Lab Results  Component Value Date   WBC 8.5 10/08/2022   NEUTROABS 4.4 10/08/2022   HGB 13.6 10/08/2022   HCT 43.7 10/08/2022   MCV 91.6 10/08/2022   PLT 225.0 Repeated and verified X2. 10/08/2022    No results found for: "LABCA2"  No components found for: "ZOXWRU045"  No results for input(s): "INR" in the last 168 hours.  No results found for: "LABCA2"  No results found for: "WUJ811"  No results found for: "CAN125"  No results found for: "CAN153"  No results found for: "CA2729"  No  components found for: "HGQUANT"  No results found for: "CEA1", "CEA" / No results found for: "CEA1", "CEA"   No results found for: "AFPTUMOR"  No results found for: "CHROMOGRNA"  No results found for: "KPAFRELGTCHN", "LAMBDASER", "KAPLAMBRATIO" (kappa/lambda light chains)  No results found for: "HGBA", "HGBA2QUANT", "HGBFQUANT", "HGBSQUAN" (Hemoglobinopathy evaluation)   No results found for: "LDH"  Lab Results  Component Value Date   IRON 38 (L) 10/16/2009   IRONPCTSAT 10.2 (L) 10/16/2009   (Iron and TIBC)  Lab Results  Component Value Date   FERRITIN 8.9 (L) 10/16/2009    Urinalysis    Component Value Date/Time   COLORURINE YELLOW 10/08/2022 1615   APPEARANCEUR CLEAR 10/08/2022 1615   LABSPEC <=1.005 (A) 10/08/2022 1615   PHURINE 6.5 10/08/2022 1615   GLUCOSEU 500 (A) 10/08/2022 1615   HGBUR NEGATIVE 10/08/2022 1615   BILIRUBINUR NEGATIVE 10/08/2022 1615   BILIRUBINUR N 05/21/2015 1614   KETONESUR NEGATIVE 10/08/2022 1615   PROTEINUR N 05/21/2015 1614   PROTEINUR NEGATIVE 03/11/2009 1100   UROBILINOGEN 0.2 10/08/2022 1615   NITRITE NEGATIVE 10/08/2022 1615   LEUKOCYTESUR SMALL (A) 10/08/2022 1615    STUDIES: [place] RT BREAST BX W LOC DEV 1ST LESION IMG BX SPEC US GUIDE  Addendum Date: 11/18/2022   ADDENDUM REPORT: 11/18/2022 11:15 ADDENDUM: Pathology revealed BENIGN BREAST TISSUE WITH ACUTE AND CHRONIC INFLAMMATION, ABSCESS FORMATION, NECROSIS, FOREIGN MATERIAL, GIANT CELL REACTIONS AND HEMOSIDERIN DEPOSIT CONSISTENT WITH PRIOR PROCEDURE CHANGES of the RIGHT breast, 12 o'clock, 4 cmfn, (coil clip). This was found to  be concordant by Dr. Frederico Hamman. Pathology results were discussed with the patient by telephone. The patient reported doing well after the biopsy with tenderness at the site. Post biopsy instructions and care were reviewed and questions were answered. The patient was encouraged to call [facility] for any additional  concerns. Surgical consultation has been arranged with Dr. Almond Lint at [facility] on December 11, 2022. Dr. Donell Beers was notified of biopsy results via secure EPIC message on November 18, 2022. Pathology results reported by Rene Kocher, RN on 11/18/2022. Electronically Signed   By: Frederico Hamman M.D.   On: 11/18/2022 11:15   Result Date: 11/18/2022 CLINICAL DATA:  62 year old female presenting for ultrasound-guided biopsy of a right breast mass. EXAM: ULTRASOUND GUIDED RIGHT BREAST CORE NEEDLE BIOPSY COMPARISON:  Previous exam(s). PROCEDURE: I met with the patient and we discussed the procedure of ultrasound-guided biopsy, including benefits and alternatives. We discussed the high likelihood of a successful procedure. We discussed the risks of the procedure, including infection, bleeding, tissue injury, clip migration, and inadequate sampling. Informed written consent was given. The usual time-out protocol was performed immediately prior to the procedure. Lesion quadrant: Upper outer quadrant Using sterile technique and 1% Lidocaine as local anesthetic, under direct ultrasound visualization, a 14 gauge spring-loaded device was used to perform biopsy of a mass in the right breast at 12 o'clock, 4 cm from the nipple using a lateral approach. At the conclusion of the procedure a coil shaped tissue marker clip was deployed into the biopsy cavity. Follow up 2 view mammogram was performed and dictated separately. IMPRESSION: Ultrasound guided biopsy of a right breast mass at 12 o'clock. No apparent complications. Electronically Signed: By: Frederico Hamman M.D. On: 11/17/2022 16:06   MM CLIP PLACEMENT RIGHT  Result Date: 11/17/2022 CLINICAL DATA:  Post biopsy mammogram of the right breast for clip placement. EXAM: 3D DIAGNOSTIC RIGHT MAMMOGRAM POST ULTRASOUND BIOPSY COMPARISON:  Previous exam(s). FINDINGS: 3D Mammographic images were obtained following ultrasound guided biopsy of the mass in the  superior right breast. The biopsy marking clip is in expected position at the site of biopsy. IMPRESSION: Appropriate positioning of the coil shaped biopsy marking clip at the site of biopsy in the superior right breast. Final Assessment: Post Procedure Mammograms for Marker Placement BI-RADS CATEGORY  35M: Post-Procedure Mammogram for Marker Placement Electronically Signed   By: Frederico Hamman M.D.   On: 11/17/2022 16:11   MM 3D DIAGNOSTIC MAMMOGRAM BILATERAL BREAST  Result Date: 11/17/2022 CLINICAL DATA:  62 year old female presenting for evaluation of a possible right breast abscess. 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 She has had problems with the surgical scar since surgery, including dehiscence with slow healing. Clinical notes indicate that the wound opened on 11/12/2022 oozing blood and pus. She has been started on a course of doxycycline, and is currently on day 4. She also has prior history of left breast excisional biopsy for atypical lobular hyperplasia in 2018. EXAM: DIGITAL DIAGNOSTIC BILATERAL MAMMOGRAM WITH TOMOSYNTHESIS AND CAD; ULTRASOUND RIGHT BREAST LIMITED TECHNIQUE: Bilateral digital diagnostic mammography and breast tomosynthesis was performed. The images were evaluated with computer-aided detection. ; Targeted ultrasound examination of the right breast was performed COMPARISON:  Previous exam(s). ACR Breast Density Category c: The breasts are heterogeneously dense, which may obscure small masses. FINDINGS: There is slight increased density at the lumpectomy site in the superior posterior right breast. There is  persistent skin thickening, similar to her prior exam in July of 2023. No other suspicious calcifications, masses or areas of distortion are seen in the bilateral breasts. Physical exam of the site demonstrates and open wound with yellowish white granulation appearing tissue. Though the wound is moist, no prelim  material is currently draining. Ultrasound targeted to the surgical scar at 12 o'clock, 4 cm from the nipple demonstrates an irregular hypoechoic mass extending in to the skin surface measuring 2.8 x 1.8 x 1.7 cm. Blood flow is seen within the hypoechoic area. No mobile fluid pockets are seen. Ultrasound of the right axilla demonstrates multiple normal-appearing lymph nodes. IMPRESSION: 1. There is a 2.8 cm mass extending to the skin surface at the site of the patient's surgical scar. No definite abscess is identified as no mobile fluid is seen. This may represent healing granulation tissue, however biopsy is recommended to ensure no recurrent malignancy. 2.  No evidence of right axillary lymphadenopathy. 3.  No evidence of left breast malignancy. RECOMMENDATION: Ultrasound guided biopsy is recommended for the right breast mass. This will be performed today and dictated in a separate report. I have discussed the findings and recommendations with the patient. If applicable, a reminder letter will be sent to the patient regarding the next appointment. BI-RADS CATEGORY  4: Suspicious. Electronically Signed   By: Frederico Hamman M.D.   On: 11/17/2022 16:01   [place] LIMITED ULTRASOUND INCLUDING AXILLA RIGHT BREAST  Result Date: 11/17/2022 CLINICAL DATA:  62 year old female presenting for evaluation of a possible right breast abscess. 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 She has had problems with the surgical scar since surgery, including dehiscence with slow healing. Clinical notes indicate that the wound opened on 11/12/2022 oozing blood and pus. She has been started on a course of doxycycline, and is currently on day 4. She also has prior history of left breast excisional biopsy for atypical lobular hyperplasia in 2018. EXAM: DIGITAL DIAGNOSTIC BILATERAL MAMMOGRAM WITH TOMOSYNTHESIS AND CAD; ULTRASOUND RIGHT BREAST LIMITED TECHNIQUE: Bilateral digital  diagnostic mammography and breast tomosynthesis was performed. The images were evaluated with computer-aided detection. ; Targeted ultrasound examination of the right breast was performed COMPARISON:  Previous exam(s). ACR Breast Density Category c: The breasts are heterogeneously dense, which may obscure small masses. FINDINGS: There is slight increased density at the lumpectomy site in the superior posterior right breast. There is persistent skin thickening, similar to her prior exam in July of 2023. No other suspicious calcifications, masses or areas of distortion are seen in the bilateral breasts. Physical exam of the site demonstrates and open wound with yellowish white granulation appearing tissue. Though the wound is moist, no prelim material is currently draining. Ultrasound targeted to the surgical scar at 12 o'clock, 4 cm from the nipple demonstrates an irregular hypoechoic mass extending in to the skin surface measuring 2.8 x 1.8 x 1.7 cm. Blood flow is seen within the hypoechoic area. No mobile fluid pockets are seen. Ultrasound of the right axilla demonstrates multiple normal-appearing lymph nodes. IMPRESSION: 1. There is a 2.8 cm mass extending to the skin surface at the site of the patient's surgical scar. No definite abscess is identified as no mobile fluid is seen. This may represent healing granulation tissue, however biopsy is recommended to ensure no recurrent malignancy. 2.  No evidence of right axillary lymphadenopathy. 3.  No evidence of left breast malignancy. RECOMMENDATION: Ultrasound guided biopsy is recommended for  the right breast mass. This will be performed today and dictated in a separate report. I have discussed the findings and recommendations with the patient. If applicable, a reminder letter will be sent to the patient regarding the next appointment. BI-RADS CATEGORY  4: Suspicious. Electronically Signed   By: Frederico Hamman M.D.   On: 11/17/2022 16:01     ELIGIBLE FOR  AVAILABLE RESEARCH PROTOCOL: no  ASSESSMENT: 62 y.o. [place] woman status post right breast upper outer quadrant biopsy 04/10/2020 for a clinical T2N0, stage IIA invasive ductal carcinoma, grade 3, estrogen receptor positive, progesterone receptor and HER-2 negative, with an MIB-1 of 25%.  (1) Oncotype obtained from the original biopsy shows a score of 48, predicting a recurrence rate outside the breast within 9 years of 28% if the only systemic therapy is antiestrogen for 5 years.  It also predicts a greater than 15% benefit from chemotherapy.  (2) right lumpectomy and sentinel lymph node dissection 05/21/2020 shows a pT2 pN0, stage IIB invasive ductal carcinoma, grade 3, with negative margins.  (3) adjuvant chemotherapy to consist of cyclophosphamide and doxorubicin in dose dense fashion x4 starting 06/18/2020, with Neulasta day 3; followed by paclitaxel weekly x12 started 08/28/2020 through 11/13/2020  (a) echo 05/13/2020 shows an ejection fraction in the 55-60% range  (4) adjuvant radiation 03/27/2021   (5) Started anastrozole March 2023.  PLAN:  Breast Abscess Persistent issue despite recent antibiotic treatment. No signs of infection such as fever or chills. Abscess still draining, but no pus noted on examination.  -Keep appointment with specialist on 12/11/2022 for further evaluation and management. -Continue monitoring for signs of infection.  Breast Cancer (Post-Treatment) On Anastrozole since Spring 2023 with no reported side effects. No issues with wound healing related to Anastrozole. -Continue Anastrozole as prescribed.  Nonalcoholic Fatty Liver Disease On Semaglutide with reported improvement in symptoms and some weight loss. -Continue Semaglutide as prescribed.  Follow-up in 6 months to monitor breast abscess and overall health. Self breast exam recommended monthly.  Total time spent: 30 minutes  *Total Encounter Time as defined by the Centers for Medicare and  Medicaid Services includes, in addition to the face-to-face time of a patient visit (documented in the note above) non-face-to-face time: obtaining and reviewing outside history, ordering and reviewing medications, tests or procedures, care coordination (communications with other health care professionals or caregivers) and documentation in the medical record.

## 2022-12-28 ENCOUNTER — Other Ambulatory Visit: Payer: Self-pay | Admitting: Adult Health

## 2022-12-29 ENCOUNTER — Ambulatory Visit: Payer: 59 | Admitting: Rheumatology

## 2022-12-31 NOTE — Telephone Encounter (Signed)
FYI:   I found eye exam notes that was sent to Dr. Lonzo Cloud. I have abstracted the exam notes.

## 2023-01-01 ENCOUNTER — Encounter: Payer: Self-pay | Admitting: Oncology

## 2023-01-06 ENCOUNTER — Encounter: Payer: Self-pay | Admitting: Oncology

## 2023-01-06 NOTE — Telephone Encounter (Signed)
Telephone call  

## 2023-02-01 ENCOUNTER — Ambulatory Visit (HOSPITAL_BASED_OUTPATIENT_CLINIC_OR_DEPARTMENT_OTHER): Payer: 59 | Admitting: Obstetrics & Gynecology

## 2023-02-02 ENCOUNTER — Ambulatory Visit (HOSPITAL_BASED_OUTPATIENT_CLINIC_OR_DEPARTMENT_OTHER): Payer: 59 | Admitting: Obstetrics & Gynecology

## 2023-02-17 DIAGNOSIS — Z419 Encounter for procedure for purposes other than remedying health state, unspecified: Secondary | ICD-10-CM | POA: Diagnosis not present

## 2023-02-19 ENCOUNTER — Other Ambulatory Visit: Payer: Self-pay | Admitting: Hematology and Oncology

## 2023-03-11 ENCOUNTER — Other Ambulatory Visit: Payer: Self-pay | Admitting: Family Medicine

## 2023-03-11 ENCOUNTER — Encounter: Payer: Self-pay | Admitting: Oncology

## 2023-03-11 NOTE — Telephone Encounter (Signed)
Last Fill: 10/27/2022  Last OV: 10/08/2022 Next OV: 04/08/2023  Routing to provider for review/authorization.

## 2023-03-11 NOTE — Telephone Encounter (Signed)
Copied from CRM [id number]. Topic: Clinical - Medication Refill >> Mar 11, 2023 12:56 PM Denese Killings wrote: Most Recent Primary Care Visit:  Provider: [facility]  Department: [facility]  Visit Type: LAB  Date: 10/13/2022  Medication: Semaglutide,0.25 or 0.5MG /DOS, 2 MG/3ML  Has the patient contacted their pharmacy? Yes (Agent: If no, request that the patient contact the pharmacy for the refill. If patient does not wish to contact the pharmacy document the reason why and proceed with request.) (Agent: If yes, when and what did the pharmacy advise?) awaiting on doctor  Is this the correct pharmacy for this prescription? Yes If no, delete pharmacy and type the correct one.  This is the patient's preferred pharmacy:   [id number] [place], [place] - [address] [place] [address] Phone: [phone] Fax: [phone]   Has the prescription been filled recently? No  Is the patient out of the medication? Yes  Has the patient been seen for an appointment in the last year OR does the patient have an upcoming appointment? Yes  Can we respond through MyChart? Yes  Agent: Please be advised that Rx refills may take up to 3 business days. We ask that you follow-up with your pharmacy.

## 2023-03-12 MED ORDER — SEMAGLUTIDE(0.25 OR 0.5MG/DOS) 2 MG/3ML ~~LOC~~ SOPN: 0.5000 mg | PEN_INJECTOR | SUBCUTANEOUS | 3 refills | Status: DC

## 2023-03-31 ENCOUNTER — Encounter: Payer: Self-pay | Admitting: Oncology

## 2023-03-31 ENCOUNTER — Telehealth: Payer: Self-pay

## 2023-03-31 ENCOUNTER — Other Ambulatory Visit (HOSPITAL_COMMUNITY): Payer: Self-pay

## 2023-03-31 NOTE — Telephone Encounter (Signed)
Pharmacy Patient Advocate Encounter   Received notification from CoverMyMeds that prior authorization for Ozempic (0.25 or 0.5 MG/DOSE) 2MG /3ML pen-injectors is required/requested.   Insurance verification completed.   The patient is insured through [facility] [place] .   Per test claim: PA required; PA submitted to above mentioned insurance via CoverMyMeds Key/confirmation #/EOC [id number] Status is pending

## 2023-03-31 NOTE — Telephone Encounter (Signed)
Pharmacy Patient Advocate Encounter  Received notification from [facility] that Prior Authorization for Ozempic (0.25 or 0.5 MG/DOSE) 2MG /3ML pen-injectors has been APPROVED from 03/17/2023 to 03/30/2024   PA #/Case ID/Reference #: [id number]

## 2023-04-01 ENCOUNTER — Encounter: Payer: Self-pay | Admitting: Oncology

## 2023-04-08 ENCOUNTER — Ambulatory Visit: Payer: 59 | Admitting: Family Medicine

## 2023-04-11 NOTE — Assessment & Plan Note (Signed)
 Encouraged DASH or MIND diet, decrease po intake and increase exercise as tolerated. Needs 7-8 hours of sleep nightly. Avoid trans fats, eat small, frequent meals every 4-5 hours with lean proteins, complex carbs and healthy fats. Minimize simple carbs, high fat foods and processed foods

## 2023-04-11 NOTE — Assessment & Plan Note (Signed)
 Hydrate and monitor

## 2023-04-11 NOTE — Assessment & Plan Note (Signed)
 Well controlled, no changes to meds. Encouraged heart healthy diet such as the DASH diet and exercise as tolerated.

## 2023-04-11 NOTE — Assessment & Plan Note (Signed)
 hgba1c acceptable, minimize simple carbs. Increase exercise as tolerated. Continue current meds

## 2023-04-11 NOTE — Assessment & Plan Note (Signed)
 Encourage heart healthy diet such as MIND or DASH diet, increase exercise, avoid trans fats, simple carbohydrates and processed foods, consider a krill or fish or flaxseed oil cap daily.

## 2023-04-11 NOTE — Progress Notes (Unsigned)
 MyChart Video Visit    Virtual Visit via Video Note   This patient is at least at moderate risk for complications without adequate follow up. This format is felt to be most appropriate for this patient at this time. Physical exam was limited by quality of the video and audio technology used for the visit. *** was able to get the patient set up on a video visit.  Patient location: *** Patient and provider in visit Provider location: Office  I discussed the limitations of evaluation and management by telemedicine and the availability of in person appointments. The patient expressed understanding and agreed to proceed.  Visit Date: 04/13/2023  Today's healthcare provider: Danise Edge, MD     Subjective:    Patient ID: Yolanda [name], female    DOB: [birth year], 63 y.o.   MRN: [id number]  No chief complaint on file.   HPI Discussed the use of AI scribe software for clinical note transcription with the patient, who gave verbal consent to proceed.  History of Present Illness            Past Medical History:  Diagnosis Date  . Anemia May 2022   Chemotherapy  . Asthma    with respiratory infection  . Cancer [facility])    breast cancer - right  . Cataract March 2018   Optometrist visit  . Diabetes mellitus without complication (HCC)    type II  . Fatty liver   . Fibroid uterus   . GERD (gastroesophageal reflux disease) June 2021  . Hypertension    Patient reports that she has never been diagnosied with HTN, "I take HCTZ for swelling in my feet, if needed."  . Osteoarthritis 03/10/2015  . PONV (postoperative nausea and vomiting)    2014  . Refusal of blood transfusions as patient is Jehovah's Witness    NO BLOOD PRODUCTS  . Sleep apnea     Past Surgical History:  Procedure Laterality Date  . BREAST BIOPSY Right 11/17/2022   [place] RT BREAST BX W LOC DEV 1ST LESION IMG BX SPEC US GUIDE 11/17/2022 [facility]  . BREAST CYST ASPIRATION    . BREAST EXCISIONAL  BIOPSY    . BREAST LUMPECTOMY WITH SENTINEL LYMPH NODE BIOPSY Right 05/21/2020   Procedure: RIGHT BREAST LUMPECTOMY WITH SENTINEL LYMPH NODE BIOPSY;  Surgeon: Almond Lint, MD;  Location: [facility] OR;  Service: General;  Laterality: Right;  . CHOLECYSTECTOMY  02/2009  . COLONOSCOPY    . DILATATION & CURRETTAGE/HYSTEROSCOPY WITH RESECTOCOPE N/A 03/13/2014   Procedure: DILATATION & CURETTAGE/HYSTEROSCOPY ;  Surgeon: Annamaria Boots, MD;  Location: [facility];  Service: Gynecology;  Laterality: N/A;  . PORTA CATH REMOVAL  01/2021  . PORTACATH PLACEMENT Left 05/21/2020   Procedure: INSERTION PORT-A-CATH;  Surgeon: Almond Lint, MD;  Location: [facility] OR;  Service: General;  Laterality: Left;  . RADIOACTIVE SEED GUIDED EXCISIONAL BREAST BIOPSY Left 12/30/2016   Procedure: RADIOACTIVE SEED GUIDED EXCISIONAL BREAST BIOPSY;  Surgeon: Almond Lint, MD;  Location: [facility];  Service: General;  Laterality: Left;    Family History  Problem Relation Age of Onset  . Asthma Mother   . Diabetes Mother   . Diabetes Father   . Heart attack Father   . Heart disease Father   . Heart Problems Sister   . Asthma Sister   . Hypertension Sister   . Diabetes Brother   . Diabetes Brother   . Asthma Brother   . Heart disease Maternal Grandfather  Social History   Socioeconomic History  . Marital status: Married    Spouse name: Not on file  . Number of children: Not on file  . Years of education: Not on file  . Highest education level: 12th grade  Occupational History  . Not on file  Tobacco Use  . Smoking status: Never    Passive exposure: Never  . Smokeless tobacco: Never  Vaping Use  . Vaping status: Never Used  Substance and Sexual Activity  . Alcohol use: No  . Drug use: No  . Sexual activity: Not Currently    Partners: Male    Birth control/protection: Post-menopausal  Other Topics Concern  . Not on file  Social History Narrative  . Not on file   Social Drivers of Health    Financial Resource Strain: Low Risk  (04/06/2023)   Overall Financial Resource Strain (CARDIA)   . Difficulty of Paying Living Expenses: Not hard at all  Food Insecurity: No Food Insecurity (04/06/2023)   Hunger Vital Sign   . Worried About Programme researcher, broadcasting/film/video in the Last Year: Never true   . Ran Out of Food in the Last Year: Never true  Transportation Needs: No Transportation Needs (04/06/2023)   PRAPARE - Transportation   . Lack of Transportation (Medical): No   . Lack of Transportation (Non-Medical): No  Physical Activity: Insufficiently Active (04/06/2023)   Exercise Vital Sign   . Days of Exercise per Week: 4 days   . Minutes of Exercise per Session: 30 min  Stress: No Stress Concern Present (04/06/2023)   [employer] of Occupational Health - Occupational Stress Questionnaire   . Feeling of Stress : Not at all  Social Connections: Socially Integrated (04/06/2023)   Social Connection and Isolation Panel [NHANES]   . Frequency of Communication with Friends and Family: More than three times a week   . Frequency of Social Gatherings with Friends and Family: More than three times a week   . Attends Religious Services: More than 4 times per year   . Active Member of Clubs or Organizations: Yes   . Attends Banker Meetings: More than 4 times per year   . Marital Status: Married  Catering manager Violence: Not on file    Outpatient Medications Prior to Visit  Medication Sig Dispense Refill  . acetaminophen (TYLENOL) 500 MG tablet Take 500 mg by mouth every 6 (six) hours as needed for moderate pain.    Marland Kitchen albuterol (PROVENTIL HFA;VENTOLIN HFA) 108 (90 Base) MCG/ACT inhaler Inhale 1-2 puffs into the lungs every 4 (four) hours as needed for wheezing or shortness of breath. 18 g 2  . albuterol (VENTOLIN HFA) 108 (90 Base) MCG/ACT inhaler Inhale 2 puffs into the lungs every 6 (six) hours as needed for wheezing or shortness of breath. 8 g 0  . anastrozole (ARIMIDEX) 1 MG  tablet TAKE 1 TABLET BY MOUTH EVERY DAY 30 tablet 5  . benzonatate (TESSALON) 100 MG capsule Take 1 capsule (100 mg total) by mouth 3 (three) times daily as needed. (Patient not taking: Reported on 10/27/2022) 30 capsule 0  . CALCIUM-MAGNESIUM PO Take 30 mLs by mouth once a week.    . Cholecalciferol 125 MCG (5000 UT) capsule Take 5,000 Units by mouth daily.    . fluconazole (DIFLUCAN) 150 MG tablet Take 1 tablet (150 mg total) by mouth once a week. (Patient not taking: Reported on 10/27/2022) 2 tablet 1  . glucose blood (ONETOUCH VERIO) test strip CHECK BLOOD  SUGAR DAILY 100 strip 4  . loratadine (CLARITIN) 10 MG tablet TAKE 1 TABLET(10 MG) BY MOUTH DAILY 90 tablet 3  . loratadine (CLARITIN) 10 MG tablet Take 1 tablet (10 mg total) by mouth daily. 30 tablet 11  . Menaquinone-7 (VITAMIN K2) 100 MCG CAPS Take 100 mcg by mouth daily.    . metFORMIN (GLUCOPHAGE-XR) 500 MG 24 hr tablet Take 2 tablets (1,000 mg total) by mouth 2 (two) times daily with a meal. 360 tablet 3  . Multiple Vitamin (MULTIVITAMIN) LIQD Take 30 mLs by mouth every other day.    . Olopatadine HCl 0.6 % SOLN Place 2 sprays into both nostrils 2 (two) times daily as needed (allergies).    Marland Kitchen omeprazole (PRILOSEC) 40 MG capsule Take 1 capsule (40 mg total) by mouth daily. 30 capsule 2  . Polyethylene Glycol 400 (BLINK TEARS OP) Place 1 drop into both eyes daily as needed (dry eyes).    . Semaglutide,0.25 or 0.5MG /DOS, 2 MG/3ML SOPN Inject 0.5 mg into the skin once a week. 9 mL 3  . triamcinolone ointment (KENALOG) 0.5 % Apply 1 Application topically 2 (two) times daily. 30 g 0   No facility-administered medications prior to visit.    Allergies  Allergen Reactions  . Lisinopril Shortness Of Breath and Cough    wheezing    Review of Systems  Constitutional:  Negative for fever and malaise/fatigue.  HENT:  Negative for congestion.   Eyes:  Negative for blurred vision.  Respiratory:  Negative for shortness of breath.    Cardiovascular:  Negative for chest pain, palpitations and leg swelling.  Gastrointestinal:  Negative for abdominal pain, blood in stool and nausea.  Genitourinary:  Negative for dysuria and frequency.  Musculoskeletal:  Negative for falls.  Skin:  Negative for rash.  Neurological:  Negative for dizziness, loss of consciousness and headaches.  Endo/Heme/Allergies:  Negative for environmental allergies.  Psychiatric/Behavioral:  Negative for depression. The patient is not nervous/anxious.       Objective:    Physical Exam Constitutional:      General: She is not in acute distress.    Appearance: Normal appearance. She is not ill-appearing or toxic-appearing.  HENT:     Head: Normocephalic and atraumatic.     Right Ear: External ear normal.     Left Ear: External ear normal.     Nose: Nose normal.  Eyes:     General:        Right eye: No discharge.        Left eye: No discharge.  Pulmonary:     Effort: Pulmonary effort is normal.  Skin:    Findings: No rash.  Neurological:     Mental Status: She is alert and oriented to person, place, and time.  Psychiatric:        Behavior: Behavior normal.   LMP 02/21/2014  Wt Readings from Last 3 Encounters:  11/30/22 167 lb 3.2 oz (75.8 kg)  10/27/22 172 lb 9.6 oz (78.3 kg)  10/08/22 172 lb 9.6 oz (78.3 kg)       Assessment & Plan:  Essential hypertension Assessment & Plan: Well controlled, no changes to meds. Encouraged heart healthy diet such as the DASH diet and exercise as tolerated.     Obesity, unspecified class, unspecified obesity type, unspecified whether serious comorbidity present Assessment & Plan: Encouraged DASH or MIND diet, decrease po intake and increase exercise as tolerated. Needs 7-8 hours of sleep nightly. Avoid trans fats, eat small, frequent meals  every 4-5 hours with lean proteins, complex carbs and healthy fats. Minimize simple carbs, high fat foods and processed foods    Type 2 diabetes mellitus with  obesity (HCC) Assessment & Plan: hgba1c acceptable, minimize simple carbs. Increase exercise as tolerated. Continue current meds    Hyperlipidemia, unspecified hyperlipidemia type Assessment & Plan: Encourage heart healthy diet such as MIND or DASH diet, increase exercise, avoid trans fats, simple carbohydrates and processed foods, consider a krill or fish or flaxseed oil cap daily.     Hypercalcemia Assessment & Plan: Hydrate and monitor       Assessment and Plan              I discussed the assessment and treatment plan with the patient. The patient was provided an opportunity to ask questions and all were answered. The patient agreed with the plan and demonstrated an understanding of the instructions.   The patient was advised to call back or seek an in-person evaluation if the symptoms worsen or if the condition fails to improve as anticipated.  Danise Edge, MD [facility] [phone] (phone) [phone] (fax)  [facility]

## 2023-04-13 ENCOUNTER — Telehealth (INDEPENDENT_AMBULATORY_CARE_PROVIDER_SITE_OTHER): Payer: 59 | Admitting: Family Medicine

## 2023-04-13 ENCOUNTER — Encounter: Payer: Self-pay | Admitting: Oncology

## 2023-04-13 ENCOUNTER — Encounter: Payer: Self-pay | Admitting: Family Medicine

## 2023-04-13 VITALS — BP 136/84 | HR 84 | Ht 63.0 in | Wt 173.5 lb

## 2023-04-13 DIAGNOSIS — E669 Obesity, unspecified: Secondary | ICD-10-CM

## 2023-04-13 DIAGNOSIS — I10 Essential (primary) hypertension: Secondary | ICD-10-CM

## 2023-04-13 DIAGNOSIS — E785 Hyperlipidemia, unspecified: Secondary | ICD-10-CM | POA: Diagnosis not present

## 2023-04-13 DIAGNOSIS — R0789 Other chest pain: Secondary | ICD-10-CM

## 2023-04-13 DIAGNOSIS — E1169 Type 2 diabetes mellitus with other specified complication: Secondary | ICD-10-CM

## 2023-04-13 DIAGNOSIS — N644 Mastodynia: Secondary | ICD-10-CM

## 2023-04-13 DIAGNOSIS — M549 Dorsalgia, unspecified: Secondary | ICD-10-CM

## 2023-04-13 DIAGNOSIS — R748 Abnormal levels of other serum enzymes: Secondary | ICD-10-CM

## 2023-04-13 NOTE — Progress Notes (Signed)
 Virtual Visit via Video Note   This patient is at least at moderate risk for complications without adequate follow up. This format is felt to be most appropriate for this patient at this time. Physical exam was limited by quality of the video and audio technology used for the visit. Juanetta, CMA was able to get the patient set up on a video visit.  Patient location: home Patient and provider in visit Provider location: Office  I discussed the limitations of evaluation and management by telemedicine and the availability of in person appointments. The patient expressed understanding and agreed to proceed.  Visit Date: 04/13/2023  Today's healthcare provider: Danise Edge, MD  Subjective:    Patient ID: Yolanda [name], female    DOB: October 10, 1960, 63 y.o.   MRN: [id number]  Chief Complaint  Patient presents with  . Follow-up    HPI Discussed the use of AI scribe software for clinical note transcription with the patient, who gave verbal consent to proceed.  History of Present Illness Yolanda [name] is a 63 year old female who presents with worsening breast pain and new back pain.  She experiences significant pain in her right breast, which has been present for an extended period and is exacerbated by certain movements, such as leaning over. The pain is described as 'tremendous' with a piercing sensation under the breast, in the bone section, which started approximately three weeks ago. A biopsy performed on October 1st revealed benign tissue with acute and chronic inflammation, abscess, necrosis, and giant cell reactions, but was negative for malignancy. She was on antibiotics at the time of the biopsy, which may have influenced the decision not to continue antibiotics. Despite this, the area is still crusty upon waking, indicating ongoing drainage.  She reports a new onset of sharp, constant pain in the middle to lower back, specifically at the bottom of the ribs on the right side,  which began one to two weeks ago. No recent falls or injuries that could have precipitated this pain.  In terms of systemic symptoms, she mentions having had a fever for a couple of days, which she did not initially connect to her breast symptoms. No nausea, vomiting, trouble breathing, or palpitations.    Past Medical History:  Diagnosis Date  . Anemia May 2022   Chemotherapy  . Asthma    with respiratory infection  . Cancer [facility])    breast cancer - right  . Cataract March 2018   Optometrist visit  . Diabetes mellitus without complication (HCC)    type II  . Fatty liver   . Fibroid uterus   . GERD (gastroesophageal reflux disease) June 2021  . Hypertension    Patient reports that she has never been diagnosied with HTN, "I take HCTZ for swelling in my feet, if needed."  . Osteoarthritis 03/10/2015  . PONV (postoperative nausea and vomiting)    2014  . Refusal of blood transfusions as patient is Jehovah's Witness    NO BLOOD PRODUCTS  . Sleep apnea     Past Surgical History:  Procedure Laterality Date  . BREAST BIOPSY Right 11/17/2022   [place] RT BREAST BX W LOC DEV 1ST LESION IMG BX SPEC US GUIDE 11/17/2022 [facility]  . BREAST CYST ASPIRATION    . BREAST EXCISIONAL BIOPSY    . BREAST LUMPECTOMY WITH SENTINEL LYMPH NODE BIOPSY Right 05/21/2020   Procedure: RIGHT BREAST LUMPECTOMY WITH SENTINEL LYMPH NODE BIOPSY;  Surgeon: Almond Lint, MD;  Location: [facility] OR;  Service: General;  Laterality: Right;  . CHOLECYSTECTOMY  02/2009  . COLONOSCOPY    . DILATATION & CURRETTAGE/HYSTEROSCOPY WITH RESECTOCOPE N/A 03/13/2014   Procedure: DILATATION & CURETTAGE/HYSTEROSCOPY ;  Surgeon: Annamaria Boots, MD;  Location: [facility];  Service: Gynecology;  Laterality: N/A;  . PORTA CATH REMOVAL  01/2021  . PORTACATH PLACEMENT Left 05/21/2020   Procedure: INSERTION PORT-A-CATH;  Surgeon: Almond Lint, MD;  Location: [facility] OR;  Service: General;  Laterality: Left;  . RADIOACTIVE SEED GUIDED  EXCISIONAL BREAST BIOPSY Left 12/30/2016   Procedure: RADIOACTIVE SEED GUIDED EXCISIONAL BREAST BIOPSY;  Surgeon: Almond Lint, MD;  Location: [facility];  Service: General;  Laterality: Left;    Family History  Problem Relation Age of Onset  . Asthma Mother   . Diabetes Mother   . Diabetes Father   . Heart attack Father   . Heart disease Father   . Heart Problems Sister   . Asthma Sister   . Hypertension Sister   . Diabetes Brother   . Diabetes Brother   . Asthma Brother   . Heart disease Maternal Grandfather     Social History   Socioeconomic History  . Marital status: Married    Spouse name: Not on file  . Number of children: Not on file  . Years of education: Not on file  . Highest education level: 12th grade  Occupational History  . Not on file  Tobacco Use  . Smoking status: Never    Passive exposure: Never  . Smokeless tobacco: Never  Vaping Use  . Vaping status: Never Used  Substance and Sexual Activity  . Alcohol use: No  . Drug use: No  . Sexual activity: Not Currently    Partners: Male    Birth control/protection: Post-menopausal  Other Topics Concern  . Not on file  Social History Narrative  . Not on file   Social Drivers of Health   Financial Resource Strain: Low Risk  (04/06/2023)   Overall Financial Resource Strain (CARDIA)   . Difficulty of Paying Living Expenses: Not hard at all  Food Insecurity: No Food Insecurity (04/06/2023)   Hunger Vital Sign   . Worried About Programme researcher, broadcasting/film/video in the Last Year: Never true   . Ran Out of Food in the Last Year: Never true  Transportation Needs: No Transportation Needs (04/06/2023)   PRAPARE - Transportation   . Lack of Transportation (Medical): No   . Lack of Transportation (Non-Medical): No  Physical Activity: Insufficiently Active (04/06/2023)   Exercise Vital Sign   . Days of Exercise per Week: 4 days   . Minutes of Exercise per Session: 30 min  Stress: No Stress Concern Present  (04/06/2023)   [employer] of Occupational Health - Occupational Stress Questionnaire   . Feeling of Stress : Not at all  Social Connections: Socially Integrated (04/06/2023)   Social Connection and Isolation Panel [NHANES]   . Frequency of Communication with Friends and Family: More than three times a week   . Frequency of Social Gatherings with Friends and Family: More than three times a week   . Attends Religious Services: More than 4 times per year   . Active Member of Clubs or Organizations: Yes   . Attends Banker Meetings: More than 4 times per year   . Marital Status: Married  Catering manager Violence: Not on file    Outpatient Medications Prior to Visit  Medication Sig Dispense Refill  .  acetaminophen (TYLENOL) 500 MG tablet Take 500 mg by mouth every 6 (six) hours as needed for moderate pain.    Marland Kitchen albuterol (PROVENTIL HFA;VENTOLIN HFA) 108 (90 Base) MCG/ACT inhaler Inhale 1-2 puffs into the lungs every 4 (four) hours as needed for wheezing or shortness of breath. 18 g 2  . albuterol (VENTOLIN HFA) 108 (90 Base) MCG/ACT inhaler Inhale 2 puffs into the lungs every 6 (six) hours as needed for wheezing or shortness of breath. 8 g 0  . anastrozole (ARIMIDEX) 1 MG tablet TAKE 1 TABLET BY MOUTH EVERY DAY 30 tablet 5  . benzonatate (TESSALON) 100 MG capsule Take 1 capsule (100 mg total) by mouth 3 (three) times daily as needed. 30 capsule 0  . CALCIUM-MAGNESIUM PO Take 30 mLs by mouth once a week.    . Cholecalciferol 125 MCG (5000 UT) capsule Take 5,000 Units by mouth daily.    . fluconazole (DIFLUCAN) 150 MG tablet Take 1 tablet (150 mg total) by mouth once a week. 2 tablet 1  . glucose blood (ONETOUCH VERIO) test strip CHECK BLOOD SUGAR DAILY 100 strip 4  . loratadine (CLARITIN) 10 MG tablet TAKE 1 TABLET(10 MG) BY MOUTH DAILY 90 tablet 3  . loratadine (CLARITIN) 10 MG tablet Take 1 tablet (10 mg total) by mouth daily. 30 tablet 11  . Menaquinone-7 (VITAMIN K2) 100  MCG CAPS Take 100 mcg by mouth daily.    . metFORMIN (GLUCOPHAGE-XR) 500 MG 24 hr tablet Take 2 tablets (1,000 mg total) by mouth 2 (two) times daily with a meal. 360 tablet 3  . Multiple Vitamin (MULTIVITAMIN) LIQD Take 30 mLs by mouth every other day.    . Olopatadine HCl 0.6 % SOLN Place 2 sprays into both nostrils 2 (two) times daily as needed (allergies).    Marland Kitchen omeprazole (PRILOSEC) 40 MG capsule Take 1 capsule (40 mg total) by mouth daily. 30 capsule 2  . Polyethylene Glycol 400 (BLINK TEARS OP) Place 1 drop into both eyes daily as needed (dry eyes).    . Semaglutide,0.25 or 0.5MG /DOS, 2 MG/3ML SOPN Inject 0.5 mg into the skin once a week. 9 mL 3  . triamcinolone ointment (KENALOG) 0.5 % Apply 1 Application topically 2 (two) times daily. 30 g 0   No facility-administered medications prior to visit.    Allergies  Allergen Reactions  . Lisinopril Shortness Of Breath and Cough    wheezing    Review of Systems  Constitutional:  Positive for malaise/fatigue. Negative for fever.  HENT:  Negative for congestion.   Eyes:  Negative for blurred vision.  Respiratory:  Negative for shortness of breath.   Cardiovascular:  Positive for chest pain. Negative for palpitations and leg swelling.  Gastrointestinal:  Negative for abdominal pain, blood in stool and nausea.  Genitourinary:  Negative for dysuria and frequency.  Musculoskeletal:  Positive for back pain and myalgias. Negative for falls.  Skin:  Negative for rash.  Neurological:  Negative for dizziness, loss of consciousness and headaches.  Endo/Heme/Allergies:  Negative for environmental allergies.  Psychiatric/Behavioral:  Negative for depression. The patient is not nervous/anxious.        Objective:    Physical Exam Constitutional:      General: She is not in acute distress.    Appearance: Normal appearance. She is not ill-appearing or toxic-appearing.  HENT:     Head: Normocephalic and atraumatic.     Right Ear: External ear  normal.     Left Ear: External ear normal.  Nose: Nose normal.  Eyes:     General:        Right eye: No discharge.        Left eye: No discharge.  Pulmonary:     Effort: Pulmonary effort is normal.  Skin:    Findings: No rash.  Neurological:     Mental Status: She is alert and oriented to person, place, and time.  Psychiatric:        Behavior: Behavior normal.    BP 136/84 Comment: Pt obtained  Pulse 84 Comment: Pt obtained  Ht 5\' 3"  (1.6 m) Comment: Pt stated  Wt 173 lb 8 oz (78.7 kg) Comment: Pt obtained  LMP 02/21/2014   BMI 30.73 kg/m  Wt Readings from Last 3 Encounters:  04/13/23 173 lb 8 oz (78.7 kg)  11/30/22 167 lb 3.2 oz (75.8 kg)  10/27/22 172 lb 9.6 oz (78.3 kg)    Diabetic Foot Exam - Simple   No data filed    Lab Results  Component Value Date   WBC 8.5 10/08/2022   HGB 13.6 10/08/2022   HCT 43.7 10/08/2022   PLT 225.0 Repeated and verified X2. 10/08/2022   GLUCOSE 93 10/08/2022   CHOL 230 (H) 10/08/2022   TRIG 187.0 (H) 10/08/2022   HDL 62.10 10/08/2022   LDLDIRECT 101.0 10/17/2020   LDLCALC 131 (H) 10/08/2022   ALT 27 10/08/2022   AST 27 10/08/2022   NA 138 10/08/2022   K 4.4 10/08/2022   CL 100 10/08/2022   CREATININE 0.70 10/08/2022   BUN 11 10/08/2022   CO2 30 10/08/2022   TSH 1.81 10/08/2022   INR 0.9 02/12/2022   HGBA1C 6.6 (H) 10/08/2022   MICROALBUR <0.7 10/08/2022    Lab Results  Component Value Date   TSH 1.81 10/08/2022   Lab Results  Component Value Date   WBC 8.5 10/08/2022   HGB 13.6 10/08/2022   HCT 43.7 10/08/2022   MCV 91.6 10/08/2022   PLT 225.0 Repeated and verified X2. 10/08/2022   Lab Results  Component Value Date   NA 138 10/08/2022   K 4.4 10/08/2022   CO2 30 10/08/2022   GLUCOSE 93 10/08/2022   BUN 11 10/08/2022   CREATININE 0.70 10/08/2022   BILITOT 0.3 10/08/2022   ALKPHOS 166 (H) 10/13/2022   AST 27 10/08/2022   ALT 27 10/08/2022   PROT 7.8 10/08/2022   ALBUMIN 4.3 10/08/2022   CALCIUM  11.2 (H) 10/08/2022   ANIONGAP 6 04/14/2021   GFR 92.03 10/08/2022   Lab Results  Component Value Date   CHOL 230 (H) 10/08/2022   Lab Results  Component Value Date   HDL 62.10 10/08/2022   Lab Results  Component Value Date   LDLCALC 131 (H) 10/08/2022   Lab Results  Component Value Date   TRIG 187.0 (H) 10/08/2022   Lab Results  Component Value Date   CHOLHDL 4 10/08/2022   Lab Results  Component Value Date   HGBA1C 6.6 (H) 10/08/2022       Assessment & Plan:  Essential hypertension Assessment & Plan: Well controlled, no changes to meds. Encouraged heart healthy diet such as the DASH diet and exercise as tolerated.    Orders: -     Comprehensive metabolic panel; Future -     CBC with Differential/Platelet; Future -     TSH; Future  Obesity, unspecified class, unspecified obesity type, unspecified whether serious comorbidity present Assessment & Plan: Encouraged DASH or MIND diet, decrease po intake and  increase exercise as tolerated. Needs 7-8 hours of sleep nightly. Avoid trans fats, eat small, frequent meals every 4-5 hours with lean proteins, complex carbs and healthy fats. Minimize simple carbs, high fat foods and processed foods    Type 2 diabetes mellitus with obesity (HCC) Assessment & Plan: hgba1c acceptable, minimize simple carbs. Increase exercise as tolerated. Continue current meds   Orders: -     Hemoglobin A1c; Future -     Microalbumin / creatinine urine ratio; Future  Hyperlipidemia, unspecified hyperlipidemia type Assessment & Plan: Encourage heart healthy diet such as MIND or DASH diet, increase exercise, avoid trans fats, simple carbohydrates and processed foods, consider a krill or fish or flaxseed oil cap daily.    Orders: -     Lipid panel; Future  Hypercalcemia Assessment & Plan: Hydrate and monitor   Orders: -     VITAMIN D 25 Hydroxy (Vit-D Deficiency, Fractures); Future -     Parathyroid hormone, intact (no Ca);  Future  Breast pain in female -     Sedimentation rate; Future -     High sensitivity CRP; Future -     MM 3D DIAGNOSTIC MAMMOGRAM UNILATERAL RIGHT BREAST; Future -     [place] LIMITED ULTRASOUND INCLUDING AXILLA RIGHT BREAST; Future  Elevated alkaline phosphatase level -     Alkaline phosphatase, isoenzymes; Future  Atypical chest pain -     DG Chest 2 View; Future  Mid back pain on right side -     DG Chest 2 View; Future -     DG Lumbar Spine Complete; Future    Assessment and Plan Assessment & Plan Breast Abscess Chronic, smoldering infection with increased pain and drainage. Prior biopsy in October showed benign tissue with acute and chronic inflammation, abscess, and necrosis. No malignancy. -Order diagnostic mammogram and ultrasound of the right breast. -Order CBC, ESR, CRP to assess for systemic inflammation/infection. -Order wound culture if possible. -Consider referral to surgeon or infectious disease specialist based on imaging and lab results.  Atypical Chest Pain New onset of pain under the right breast, exacerbated by certain movements. -Order chest x-ray to evaluate for possible causes.  Mid Back Pain on Right Side New onset, constant pain in the mid to lower back on the right side, started 1-2 weeks ago. -Order mid back x-ray to evaluate for possible causes.  Follow-up Plan -Schedule follow-up appointment in 2-3 months. -If symptoms worsen or patient develops fever, consider starting antibiotics sooner. -If labs, imaging, and symptoms indicate, consider CT scan to further evaluate.     Danise Edge, MD

## 2023-04-14 ENCOUNTER — Other Ambulatory Visit (INDEPENDENT_AMBULATORY_CARE_PROVIDER_SITE_OTHER): Payer: Medicaid Other

## 2023-04-14 ENCOUNTER — Encounter: Payer: Self-pay | Admitting: Family Medicine

## 2023-04-14 ENCOUNTER — Ambulatory Visit (INDEPENDENT_AMBULATORY_CARE_PROVIDER_SITE_OTHER): Payer: Medicaid Other | Admitting: Family Medicine

## 2023-04-14 VITALS — BP 127/81 | HR 89 | Ht 63.0 in | Wt 171.0 lb

## 2023-04-14 DIAGNOSIS — E1169 Type 2 diabetes mellitus with other specified complication: Secondary | ICD-10-CM

## 2023-04-14 DIAGNOSIS — I10 Essential (primary) hypertension: Secondary | ICD-10-CM

## 2023-04-14 DIAGNOSIS — E785 Hyperlipidemia, unspecified: Secondary | ICD-10-CM

## 2023-04-14 DIAGNOSIS — N644 Mastodynia: Secondary | ICD-10-CM | POA: Diagnosis not present

## 2023-04-14 DIAGNOSIS — E669 Obesity, unspecified: Secondary | ICD-10-CM

## 2023-04-14 DIAGNOSIS — R748 Abnormal levels of other serum enzymes: Secondary | ICD-10-CM | POA: Diagnosis not present

## 2023-04-14 LAB — CBC WITH DIFFERENTIAL/PLATELET
Basophils Absolute: 0 10*3/uL (ref 0.0–0.1)
Basophils Relative: 0.7 % (ref 0.0–3.0)
Eosinophils Absolute: 0.3 10*3/uL (ref 0.0–0.7)
Eosinophils Relative: 4.1 % (ref 0.0–5.0)
HCT: 42.3 % (ref 36.0–46.0)
Hemoglobin: 13.6 g/dL (ref 12.0–15.0)
Lymphocytes Relative: 33.8 % (ref 12.0–46.0)
Lymphs Abs: 2.1 10*3/uL (ref 0.7–4.0)
MCHC: 32.1 g/dL (ref 30.0–36.0)
MCV: 93.6 fL (ref 78.0–100.0)
Monocytes Absolute: 0.6 10*3/uL (ref 0.1–1.0)
Monocytes Relative: 9.1 % (ref 3.0–12.0)
Neutro Abs: 3.2 10*3/uL (ref 1.4–7.7)
Neutrophils Relative %: 52.3 % (ref 43.0–77.0)
Platelets: 284 10*3/uL (ref 150.0–400.0)
RBC: 4.52 Mil/uL (ref 3.87–5.11)
RDW: 14.7 % (ref 11.5–15.5)
WBC: 6.1 10*3/uL (ref 4.0–10.5)

## 2023-04-14 LAB — COMPREHENSIVE METABOLIC PANEL
ALT: 28 U/L (ref 0–35)
AST: 28 U/L (ref 0–37)
Albumin: 4.3 g/dL (ref 3.5–5.2)
Alkaline Phosphatase: 160 U/L — ABNORMAL HIGH (ref 39–117)
BUN: 14 mg/dL (ref 6–23)
CO2: 30 meq/L (ref 19–32)
Calcium: 10.7 mg/dL — ABNORMAL HIGH (ref 8.4–10.5)
Chloride: 99 meq/L (ref 96–112)
Creatinine, Ser: 0.65 mg/dL (ref 0.40–1.20)
GFR: 93.35 mL/min (ref >60.00)
Glucose, Bld: 117 mg/dL — ABNORMAL HIGH (ref 70–99)
Potassium: 4.7 meq/L (ref 3.5–5.1)
Sodium: 137 meq/L (ref 135–145)
Total Bilirubin: 0.5 mg/dL (ref 0.2–1.2)
Total Protein: 7.5 g/dL (ref 6.0–8.3)

## 2023-04-14 LAB — MICROALBUMIN / CREATININE URINE RATIO
Creatinine,U: 103.4 mg/dL
Microalb Creat Ratio: 14 mg/g (ref 0.0–30.0)
Microalb, Ur: 1.5 mg/dL (ref 0.0–1.9)

## 2023-04-14 LAB — HEMOGLOBIN A1C: Hgb A1c MFr Bld: 6.7 % — ABNORMAL HIGH (ref 4.6–6.5)

## 2023-04-14 LAB — LIPID PANEL
Cholesterol: 242 mg/dL — ABNORMAL HIGH (ref 0–200)
HDL: 83.4 mg/dL (ref >39.00)
LDL Cholesterol: 131 mg/dL — ABNORMAL HIGH (ref 0–99)
NonHDL: 158.13
Total CHOL/HDL Ratio: 3
Triglycerides: 138 mg/dL (ref 0.0–149.0)
VLDL: 27.6 mg/dL (ref 0.0–40.0)

## 2023-04-14 LAB — TSH: TSH: 2.76 u[IU]/mL (ref 0.35–5.50)

## 2023-04-14 LAB — VITAMIN D 25 HYDROXY (VIT D DEFICIENCY, FRACTURES): VITD: 43.26 ng/mL (ref 30.00–100.00)

## 2023-04-14 LAB — SEDIMENTATION RATE: Sed Rate: 20 mm/h (ref 0–30)

## 2023-04-14 LAB — HIGH SENSITIVITY CRP: CRP, High Sensitivity: 5.16 mg/L — ABNORMAL HIGH (ref 0.000–5.000)

## 2023-04-14 NOTE — Progress Notes (Signed)
 Acute Office Visit  Subjective:     Patient ID: Yolanda [name], female    DOB: 1960-03-17, 63 y.o.   MRN: [id number]  Chief Complaint  Patient presents with   Breast Problem    HPI Patient is in today for breast concern.  Discussed the use of AI scribe software for clinical note transcription with the patient, who gave verbal consent to proceed.  History of Present Illness Yolanda [name] is a 63 year old female with right breast cancer who presents with recurrent breast discomfort and incision/scar concerns.  She had a right breast abscess that was initially noted last fall, and she recalls receiving antibiotics at that time, though she does not remember the specific medication. The abscess was associated with bloody purulent drainage, and she underwent an ultrasound and biopsy at that time. Currently, there is no visible drainage, redness, or fever, but she describes the entire breast as tender with scabbing that is dry and white and only noticeable each morning she wakes up.  She experiences significant, generalized right breast tenderness, particularly upon waking, when she notices the spot is scabbed over, suggesting possible drainage. However, she has not observed any drainage on her clothes or sheets. No current fever, chills, or full-body aches are present. She has not applied any topical treatments as the scab falls off after showering and reappears the next morning. Reports the scab looks like white, dry, flaky skin; no color..   Reports she is scheduled to update her right breast US/mammogram in 1 month and does not have any scheduled follow-ups with Dr. Donell Beers (surgeon).        ROS All review of systems negative except what is listed in the HPI      Objective:    BP 127/81   Pulse 89   Ht 5\' 3"  (1.6 m)   Wt 171 lb (77.6 kg)   LMP 02/21/2014   SpO2 96%   BMI 30.29 kg/m     Physical Exam Vitals reviewed.  Constitutional:      Appearance: Normal  appearance.  Skin:    General: Skin is warm and dry.     Comments: See picture of dry scar, no obvious drainage, no erythema, warmth; dense tissues throughout right breast (patient reports baseline), does report generalized tenderness to palpation   Neurological:     Mental Status: She is alert and oriented to person, place, and time.  Psychiatric:        Mood and Affect: Mood normal.        Behavior: Behavior normal.        Thought Content: Thought content normal.        Judgment: Judgment normal.         No results found for any visits on 04/14/23.      Assessment & Plan:   Problem List Items Addressed This Visit   None Visit Diagnoses       Breast pain, right    -  Primary      Assessment & Plan Breast Cancer Post-Surgical Complication Recurrent scabbing and tenderness at the surgical site with a history of abscess in the same area. No current evidence of active infection or drainage. -She already has a lab appointment for this morning - we will keep an eye on CBC. -Advise patient to contact the breast center to move up the scheduled mammogram and ultrasound to next week if possible - let us know if we need to change orders.  -Recommend  follow-up with Dr. Donell Beers to evaluate the surgical site. -Patient aware of signs/symptoms requiring further/urgent evaluation.       No orders of the defined types were placed in this encounter.   Return if symptoms worsen or fail to improve.  Clayborne Dana, NP

## 2023-04-15 ENCOUNTER — Encounter: Payer: Self-pay | Admitting: Family Medicine

## 2023-04-15 LAB — PARATHYROID HORMONE, INTACT (NO CA): PTH: 94 pg/mL — ABNORMAL HIGH (ref 16–77)

## 2023-04-18 LAB — SPECIMEN STATUS REPORT

## 2023-04-18 LAB — ALKALINE PHOSPHATASE, ISOENZYMES
Alkaline Phosphatase: 180 IU/L — ABNORMAL HIGH (ref 44–121)
BONE FRACTION: 57 % (ref 14–68)
INTESTINAL FRAC.: 15 % (ref 0–18)
LIVER FRACTION: 28 % (ref 18–85)

## 2023-04-26 ENCOUNTER — Encounter: Payer: Self-pay | Admitting: Internal Medicine

## 2023-04-26 ENCOUNTER — Ambulatory Visit (INDEPENDENT_AMBULATORY_CARE_PROVIDER_SITE_OTHER): Payer: 59 | Admitting: Internal Medicine

## 2023-04-26 VITALS — BP 130/84 | HR 84 | Ht 63.0 in | Wt 175.2 lb

## 2023-04-26 DIAGNOSIS — E119 Type 2 diabetes mellitus without complications: Secondary | ICD-10-CM | POA: Diagnosis not present

## 2023-04-26 DIAGNOSIS — Z7985 Long-term (current) use of injectable non-insulin antidiabetic drugs: Secondary | ICD-10-CM

## 2023-04-26 DIAGNOSIS — E785 Hyperlipidemia, unspecified: Secondary | ICD-10-CM | POA: Diagnosis not present

## 2023-04-26 MED ORDER — ACCU-CHEK GUIDE W/DEVICE KIT: 1.0000 | PACK | Freq: Every day | 0 refills | Status: AC

## 2023-04-26 MED ORDER — METFORMIN HCL ER 500 MG PO TB24: 1000.0000 mg | ORAL_TABLET | Freq: Two times a day (BID) | ORAL | 3 refills | Status: DC

## 2023-04-26 MED ORDER — SEMAGLUTIDE(0.25 OR 0.5MG/DOS) 2 MG/3ML ~~LOC~~ SOPN: 0.5000 mg | PEN_INJECTOR | SUBCUTANEOUS | 3 refills | Status: DC

## 2023-04-26 MED ORDER — ACCU-CHEK GUIDE TEST VI STRP: 1.0000 | ORAL_STRIP | Freq: Every day | 3 refills | Status: AC

## 2023-04-26 MED ORDER — ROSUVASTATIN CALCIUM 5 MG PO TABS: 5.0000 mg | ORAL_TABLET | Freq: Every day | ORAL | 3 refills | Status: DC

## 2023-04-26 NOTE — Progress Notes (Signed)
 Name: Yolanda [name]  MRN/ DOB: [id number], [birth year]   Age/ Sex: 63 y.o., female    PCP: Bradd Canary, MD   Reason for Endocrinology Evaluation: Type 2 Diabetes Mellitus     Date of Initial Endocrinology Visit: 08/20/2020    PATIENT IDENTIFIER: Yolanda [name] is a 63 y.o. female with a past medical history of T2DM, Asthma, Breast ca and NASH ( S/P Bx 2024) . The patient presented for initial endocrinology clinic visit on 08/20/2020 for consultative assistance with her diabetes management.    Pt on adjuvant therapy for right Breast ca started 06/2020 ( cyclophosphamide, doxorubicin with pegfilgrastim on day 3 )but  she started weekly paclitaxel 08/21/2020.   DIABETIC HISTORY:  Yolanda [name] was diagnosed with DM in 2015. She has been on metformin only since her diagnosis.  Her hemoglobin A1c has ranged from 6.9% in 2019, peaking at 7.5% in 2020.   On her initial visit to our clinic she had an A1c of 7.4%, she was on metformin only, she has noted hyperglycemia during chemotherapy, we started Jardiance at the time.  Started Ozempic 10/2022 due to NASH diagnosis and the need for weight loss  Discontinued Jardiance 10/2022 due to recurrent genital infections SUBJECTIVE:   During the last visit (10/27/2022): A1c 6.6%   Today (04/26/23): Yolanda [name] is here for follow-up on diabetes management.   She follows with Dr. Loreta Ave for NASH She continues to follow-up with surgical oncology and oncology  for history of breast cancer  Patient has been noted with hypercalcemia  during routine labs 03/2022 at 10.7 Mg/DL (Corrected 78.29 mg/dL) with elevated PTH at 94 PG/mL  She checks her blood sugars 1x daily  She has not been on Ozempic due to changing insurance  She denies nausea or vomiting  Denies diarrhea but has occasional constipation  She is not on calcium    HOME DIABETES REGIMEN: Metformin 500 mg XR 2 tabs BID  Ozempic 0.5 mg weekly- not taking  Vitamin D daily on alternating  weeks    Statin: yes ACE-I/ARB: no- Allergic to lisinopril  Prior Diabetic Education: no   METER DOWNLOAD SUMMARY:  88-126 mg/dL    DIABETIC COMPLICATIONS: Microvascular complications:   Denies: CKD, neuropathy, retinopathy  Last eye exam: Completed 09/16/2020  Macrovascular complications:   Denies: CAD, PVD, CVA   PAST HISTORY: Past Medical History:  Past Medical History:  Diagnosis Date   Anemia May 2022   Chemotherapy   Asthma    with respiratory infection   Cancer (HCC)    breast cancer - right   Cataract March 2018   Optometrist visit   Diabetes mellitus without complication (HCC)    type II   Fatty liver    Fibroid uterus    GERD (gastroesophageal reflux disease) June 2021   Hypertension    Patient reports that she has never been diagnosied with HTN, "I take HCTZ for swelling in my feet, if needed."   Osteoarthritis 03/10/2015   PONV (postoperative nausea and vomiting)    2014   Refusal of blood transfusions as patient is Jehov[employer]'s Witness    NO BLOOD PRODUCTS   Sleep apnea    Past Surgical History:  Past Surgical History:  Procedure Laterality Date   BREAST BIOPSY Right 11/17/2022   [place] RT BREAST BX W LOC DEV 1ST LESION IMG BX SPEC US GUIDE 11/17/2022 [facility]   BREAST CYST ASPIRATION     BREAST EXCISIONAL BIOPSY     BREAST LUMPECTOMY  WITH SENTINEL LYMPH NODE BIOPSY Right 05/21/2020   Procedure: RIGHT BREAST LUMPECTOMY WITH SENTINEL LYMPH NODE BIOPSY;  Surgeon: Almond Lint, MD;  Location: [facility];  Service: General;  Laterality: Right;   CHOLECYSTECTOMY  02/2009   COLONOSCOPY     DILATATION & CURRETTAGE/HYSTEROSCOPY WITH RESECTOCOPE N/A 03/13/2014   Procedure: DILATATION & CURETTAGE/HYSTEROSCOPY ;  Surgeon: Annamaria Boots, MD;  Location: [facility];  Service: Gynecology;  Laterality: N/A;   PORTA CATH REMOVAL  01/2021   PORTACATH PLACEMENT Left 05/21/2020   Procedure: INSERTION PORT-A-CATH;  Surgeon: Almond Lint, MD;  Location: [facility];   Service: General;  Laterality: Left;   RADIOACTIVE SEED GUIDED EXCISIONAL BREAST BIOPSY Left 12/30/2016   Procedure: RADIOACTIVE SEED GUIDED EXCISIONAL BREAST BIOPSY;  Surgeon: Almond Lint, MD;  Location: [facility];  Service: General;  Laterality: Left;    Social History:  reports that she has never smoked. She has never been exposed to tobacco smoke. She has never used smokeless tobacco. She reports that she does not drink alcohol and does not use drugs. Family History:  Family History  Problem Relation Age of Onset   Asthma Mother    Diabetes Mother    Diabetes Father    Heart attack Father    Heart disease Father    Heart Problems Sister    Asthma Sister    Hypertension Sister    Diabetes Brother    Diabetes Brother    Asthma Brother    Heart disease Maternal Grandfather      HOME MEDICATIONS: Allergies as of 04/26/2023       Reactions   Lisinopril Shortness Of Breath, Cough   wheezing        Medication List        Accurate as of April 26, 2023 10:49 AM. If you have any questions, ask your nurse or doctor.          acetaminophen 500 MG tablet Commonly known as: TYLENOL Take 500 mg by mouth every 6 (six) hours as needed for moderate pain.   albuterol 108 (90 Base) MCG/ACT inhaler Commonly known as: VENTOLIN HFA Inhale 2 puffs into the lungs every 6 (six) hours as needed for wheezing or shortness of breath.   anastrozole 1 MG tablet Commonly known as: ARIMIDEX TAKE 1 TABLET BY MOUTH EVERY DAY   BLINK TEARS OP Place 1 drop into both eyes daily as needed (dry eyes).   CALCIUM-MAGNESIUM PO Take 30 mLs by mouth once a week.   Cholecalciferol 125 MCG (5000 UT) capsule Take 5,000 Units by mouth daily.   loratadine 10 MG tablet Commonly known as: CLARITIN Take 1 tablet (10 mg total) by mouth daily.   metFORMIN 500 MG 24 hr tablet Commonly known as: GLUCOPHAGE-XR Take 2 tablets (1,000 mg total) by mouth 2 (two) times daily with a meal.    multivitamin Liqd Take 30 mLs by mouth every other day.   Olopatadine HCl 0.6 % Soln Place 2 sprays into both nostrils 2 (two) times daily as needed (allergies).   omeprazole 40 MG capsule Commonly known as: PRILOSEC Take 1 capsule (40 mg total) by mouth daily.   OneTouch Verio test strip Generic drug: glucose blood CHECK BLOOD SUGAR DAILY   Vitamin K2 100 MCG Caps Take 100 mcg by mouth daily.         ALLERGIES: Allergies  Allergen Reactions   Lisinopril Shortness Of Breath and Cough    wheezing     REVIEW OF SYSTEMS: A  comprehensive ROS was conducted with the patient and is negative except as per HPI     OBJECTIVE:   VITAL SIGNS: Ht 5\' 3"  (1.6 m)   Wt 175 lb 3.2 oz (79.5 kg)   LMP 02/21/2014   BMI 31.04 kg/m    PHYSICAL EXAM:  General: Pt appears well and is in NAD  Lungs: Clear with good BS bilat   Heart: RRR   Abdomen: soft, nontender  Extremities:  Lower extremities - No pretibial edema.  Neuro: MS is good with appropriate affect, pt is alert and Ox3    DM foot exam: 04/24/2022  The skin of the feet is intact without sores or ulcerations. The pedal pulses are 2+ on right and 2+ on left. The sensation is intact to a screening 5.07, 10 gram monofilament bilaterally   DATA REVIEWED:  Lab Results  Component Value Date   HGBA1C 6.7 (H) 04/14/2023   HGBA1C 6.6 (H) 10/08/2022   HGBA1C 6.2 (A) 04/24/2022     Latest Reference Range & Units 04/14/23 08:21  Sodium 135 - 145 mEq/L 137  Potassium 3.5 - 5.1 mEq/L 4.7  Chloride 96 - 112 mEq/L 99  CO2 19 - 32 mEq/L 30  Glucose 70 - 99 mg/dL 161 (H)  BUN 6 - 23 mg/dL 14  Creatinine 0.96 - 0.45 mg/dL 4.09  Calcium 8.4 - 81.1 mg/dL 91.4 (H)  Alkaline Phosphatase 39 - 117 U/L 160 (H)  Albumin 3.5 - 5.2 g/dL 4.3  AST 0 - 37 U/L 28  ALT 0 - 35 U/L 28  Total Protein 6.0 - 8.3 g/dL 7.5  Total Bilirubin 0.2 - 1.2 mg/dL 0.5  GFR >78.29 mL/min 93.35     Latest Reference Range & Units 04/14/23 08:21   Total CHOL/HDL Ratio  3  Cholesterol 0 - 200 mg/dL 562 (H)  HDL Cholesterol >39.00 mg/dL 13.08  LDL (calc) 0 - 99 mg/dL 657 (H)  MICROALB/CREAT RATIO 0.0 - 30.0 mg/g 14.0  NonHDL  158.13  Triglycerides 0.0 - 149.0 mg/dL 846.9  VLDL 0.0 - 62.9 mg/dL 52.8    Latest Reference Range & Units 04/14/23 08:21  Creatinine,U mg/dL 413.2  Microalb, Ur 0.0 - 1.9 mg/dL 1.5  MICROALB/CREAT RATIO 0.0 - 30.0 mg/g 14.0       Old records , labs and images have been reviewed.    ASSESSMENT / PLAN / RECOMMENDATIONS:   1) Type 2 Diabetes Mellitus, Optimally controlled, Without complications - Most recent A1c of 6.7 %. Goal A1c < 7.0 %.    -Patient continues with optimal glucose control  -She does have severe genital irritation over the past month and would like to discontinue Jardiance, her PCP prescribed fluconazole but she has not started that yet -I did recommend GLP-1 agonist due to cardiovascular, renal and hepatic benefits especially with her history of fatty liver, caution against GI side effects  MEDICATIONS:   Start Ozempic 0.25 mg once weekly for 6 weeks, then increase to 0.5 mg weekly Stop Jardiance Continue Metformin 500 mg, 2 tablets with Breakfast and 2 tablet with Supper    EDUCATION / INSTRUCTIONS: BG monitoring instructions: Patient is instructed to check her blood sugars 1 times a day, fasting . Call [facility] if: BG persistently < 70  I reviewed the Rule of 15 for the treatment of hypoglycemia in detail with the patient. Literature supplied.   2) Diabetic complications:  Eye: Does not have known diabetic retinopathy.  Neuro/ Feet: Does not have known diabetic peripheral  neuropathy. Renal: Patient does not have known baseline CKD. She is not on an ACEI/ARB at present.    3) Dyslipidemia :  -LDL and TG remain above goal -She used  to be on statin therapy without intolerance issue, she stopped it because she did not have a refill -We discussed  cardiovascular benefits of statin therapy   Medication Start rosuvastatin 5 mg daily   4) Hypercalcemia:  - Most likely primary hyperparathyroidism -Corrected calcium normalizes to 10.48 Mg/DL -DXA scan 6213 showed osteopenia -GFR normal -Patient will need further workup to determine surgical candidacy  Recommendations Stay hydrated Avoid over-the-counter calcium tablets Consume 2-3 servings of dietary calcium daily Continue vitamin D3 daily on alternating weeks   F/U in 6 months     Signed electronically by: Lyndle Herrlich, MD  [facility]  [facility] [address] [place], [place] [address] Phone: [phone] FAX: [phone]   CC: Bradd Canary, MD [address] [place] [address] Phone: [phone]  Fax: [phone]    Return to Endocrinology clinic as below: Future Appointments  Date Time Provider Department Center  04/26/2023 10:50 AM Myriah Boggus, Konrad Dolores, MD [facility] None  04/30/2023  9:00 AM [facility] 1 [facility]  04/30/2023  9:10 AM [facility] [place] 1 [facility]  05/31/2023 10:15 AM Rachel Moulds, MD [facility] None  09/23/2023  3:20 PM Bradd Canary, MD [facility]  10/04/2023  8:30 AM Ashok Croon, MD [facility] None

## 2023-04-26 NOTE — Patient Instructions (Signed)
-   Continue Ozempic 0.5 mg weekly - Continue Metformin 500 mg, 2 tablets with Breakfast and 2 tablet with Supper     HOW TO TREAT LOW BLOOD SUGARS (Blood sugar LESS THAN 70 MG/DL) Please follow the RULE OF 15 for the treatment of hypoglycemia treatment (when your (blood sugars are less than 70 mg/dL)   STEP 1: Take 15 grams of carbohydrates when your blood sugar is low, which includes:  3-4 GLUCOSE TABS  OR 3-4 OZ OF JUICE OR REGULAR SODA OR ONE TUBE OF GLUCOSE GEL    STEP 2: RECHECK blood sugar in 15 MINUTES STEP 3: If your blood sugar is still low at the 15 minute recheck --> then, go back to STEP 1 and treat AGAIN with another 15 grams of carbohydrates.

## 2023-04-29 DIAGNOSIS — Z0120 Encounter for dental examination and cleaning without abnormal findings: Secondary | ICD-10-CM | POA: Diagnosis not present

## 2023-04-30 ENCOUNTER — Ambulatory Visit
Admission: RE | Admit: 2023-04-30 | Discharge: 2023-04-30 | Disposition: A | Discharge status: Home / Self Care | Payer: Medicaid Other | Source: Ambulatory Visit | Attending: Family Medicine | Admitting: Family Medicine

## 2023-04-30 DIAGNOSIS — N644 Mastodynia: Secondary | ICD-10-CM | POA: Diagnosis not present

## 2023-04-30 DIAGNOSIS — Z853 Personal history of malignant neoplasm of breast: Secondary | ICD-10-CM | POA: Diagnosis not present

## 2023-05-13 ENCOUNTER — Other Ambulatory Visit: Payer: Medicaid Other

## 2023-05-18 ENCOUNTER — Encounter: Payer: Self-pay | Admitting: Internal Medicine

## 2023-05-19 MED ORDER — ATORVASTATIN CALCIUM 10 MG PO TABS: 10.0000 mg | ORAL_TABLET | Freq: Every day | ORAL | 3 refills | Status: DC

## 2023-05-20 LAB — HM DIABETES EYE EXAM

## 2023-05-31 ENCOUNTER — Inpatient Hospital Stay: Payer: 59 | Attending: Hematology and Oncology | Admitting: Hematology and Oncology

## 2023-05-31 VITALS — BP 138/71 | HR 102 | Temp 97.4°F | Resp 18 | Ht 63.0 in | Wt 174.1 lb

## 2023-05-31 DIAGNOSIS — Z170 Estrogen receptor positive status [ER+]: Secondary | ICD-10-CM | POA: Insufficient documentation

## 2023-05-31 DIAGNOSIS — Z79899 Other long term (current) drug therapy: Secondary | ICD-10-CM | POA: Diagnosis not present

## 2023-05-31 DIAGNOSIS — Z1721 Progesterone receptor positive status: Secondary | ICD-10-CM | POA: Diagnosis not present

## 2023-05-31 DIAGNOSIS — Z1732 Human epidermal growth factor receptor 2 negative status: Secondary | ICD-10-CM | POA: Insufficient documentation

## 2023-05-31 DIAGNOSIS — K760 Fatty (change of) liver, not elsewhere classified: Secondary | ICD-10-CM | POA: Insufficient documentation

## 2023-05-31 DIAGNOSIS — Z79811 Long term (current) use of aromatase inhibitors: Secondary | ICD-10-CM | POA: Insufficient documentation

## 2023-05-31 DIAGNOSIS — M8580 Other specified disorders of bone density and structure, unspecified site: Secondary | ICD-10-CM | POA: Insufficient documentation

## 2023-05-31 DIAGNOSIS — C50411 Malignant neoplasm of upper-outer quadrant of right female breast: Secondary | ICD-10-CM | POA: Insufficient documentation

## 2023-05-31 NOTE — Progress Notes (Signed)
 [facility]  [phone] [phone]     ID: Yolanda [name] DOB: 12-19-1960  MR#: [id number]  NGE#:[id number]  Patient Care Team: Bradd Canary, MD as PCP - General (Family Medicine) Cliffton Asters, MD (Inactive) as Consulting Physician (Infectious Diseases) Jerene Bears, MD as Consulting Physician (Gynecology) Magrinat, Valentino Hue, MD (Inactive) as Consulting Physician (Oncology) Charna Elizabeth, MD as Consulting Physician (Gastroenterology) Almond Lint, MD as Consulting Physician (General Surgery) Pershing Proud, RN as Oncology Nurse Navigator Donnelly Angelica, RN as Oncology Nurse Navigator Hurshel Party, OD (Optometry) Rachel Moulds, MD  CHIEF COMPLAINT: Estrogen receptor positive breast cancer  CURRENT TREATMENT: Anastrazole.  INTERVAL HISTORY:  Discussed the use of AI scribe software for clinical note transcription with the patient, who gave verbal consent to proceed.  History of Present Illness  Yolanda [name] is a 63 year old female with breast cancer who presents with right breast pain and scar tissue.  She has ongoing issues with her right breast, which remains painful and has developed scabbing. A recent mammogram and ultrasound were unremarkable, but scar tissue was noted. No fever or chills. She is currently taking anastrozole and is unsure if it contributes to her symptoms.  She restarted Ozempic approximately one month ago for fatty liver disease, NO weight loss noted with her current weight at 174 pounds. She reports ongoing liver issues and is seeking dietary advice to manage her condition.  Her bone density was last assessed in July 2023, showing very mild osteopenia with a T-score of -1.1. She is taking vitamin D daily and engages in regular exercise.  Rest of the pertinent 10 point ROS reviewed and negative  REVIEW OF SYSTEMS: A detailed review of systems was otherwise stable.   COVID 19 VACCINATION STATUS: fully  vaccinated [employer]), with booster 12/2019   HISTORY OF CURRENT ILLNESS: From the original intake note:   I saw Yolanda [name] in December 2018 for evaluation of atypical lobular hyperplasia.  We discussed various strategies regarding breast cancer prevention at that time for her to consider.    More recently (around the time of the Super Bowl 2022) she found a lump in her right breast.  She underwent bilateral diagnostic mammography with tomography and right breast ultrasonography at [facility] on 04/10/2020 showing: breast density category C; palpable 3 cm mass in right breast at 12 o'clock; no enlarged adenopathy in right axilla.  Accordingly on the same day, she proceeded to biopsy of the right breast area in question. The pathology from this procedure ([id number]) showed: invasive ductal carcinoma, grade 3; ductal carcinoma in situ. Prognostic indicators significant for: estrogen receptor, 90% positive with moderate staining intensity and progesterone receptor, 0% negative. Proliferation marker Ki67 at 25%. HER2 equivocal by immunohistochemistry (2+), but negative by fluorescent in situ hybridization with a signals ratio 1.49 and number per cell 2.75.   Cancer Staging  Malignant neoplasm of upper-outer quadrant of right breast in female, estrogen receptor positive (HCC) Staging form: Breast, AJCC 8th Edition - Clinical: Stage IIA (cT2, cN0, cM0, G3, ER+, PR+, HER2-) - Signed by Lowella Dell, MD on 04/18/2020   The patient's subsequent history is as detailed below.   PAST MEDICAL HISTORY: Past Medical History:  Diagnosis Date   Anemia May 2022   Chemotherapy   Asthma    with respiratory infection   Cancer [facility])    breast cancer - right   Cataract March 2018   Optometrist visit   Diabetes  mellitus without complication (HCC)    type II   Fatty liver    Fibroid uterus    GERD (gastroesophageal reflux disease) June 2021   Hypertension    Patient reports that she has  never been diagnosied with HTN, "I take HCTZ for swelling in my feet, if needed."   Osteoarthritis 03/10/2015   PONV (postoperative nausea and vomiting)    2014   Refusal of blood transfusions as patient is Jehovah's Witness    NO BLOOD PRODUCTS   Sleep apnea     PAST SURGICAL HISTORY: Past Surgical History:  Procedure Laterality Date   BREAST BIOPSY Right 11/17/2022   [place] RT BREAST BX W LOC DEV 1ST LESION IMG BX SPEC US GUIDE 11/17/2022 [facility] MAMMOGRAPHY   BREAST CYST ASPIRATION     BREAST EXCISIONAL BIOPSY     BREAST LUMPECTOMY Right 05/2020   IDC with DCIS   BREAST LUMPECTOMY WITH SENTINEL LYMPH NODE BIOPSY Right 05/21/2020   Procedure: RIGHT BREAST LUMPECTOMY WITH SENTINEL LYMPH NODE BIOPSY;  Surgeon: Almond Lint, MD;  Location: [facility] OR;  Service: General;  Laterality: Right;   CHOLECYSTECTOMY  02/2009   COLONOSCOPY     DILATATION & CURRETTAGE/HYSTEROSCOPY WITH RESECTOCOPE N/A 03/13/2014   Procedure: DILATATION & CURETTAGE/HYSTEROSCOPY ;  Surgeon: Annamaria Boots, MD;  Location: [facility]S;  Service: Gynecology;  Laterality: N/A;   PORTA CATH REMOVAL  01/2021   PORTACATH PLACEMENT Left 05/21/2020   Procedure: INSERTION PORT-A-CATH;  Surgeon: Almond Lint, MD;  Location: [facility] OR;  Service: General;  Laterality: Left;   RADIOACTIVE SEED GUIDED EXCISIONAL BREAST BIOPSY Left 12/30/2016   Procedure: RADIOACTIVE SEED GUIDED EXCISIONAL BREAST BIOPSY;  Surgeon: Almond Lint, MD;  Location: [facility];  Service: General;  Laterality: Left;    FAMILY HISTORY: Family History  Problem Relation Age of Onset   Asthma Mother    Diabetes Mother    Diabetes Father    Heart attack Father    Heart disease Father    Heart Problems Sister    Asthma Sister    Hypertension Sister    Diabetes Brother    Diabetes Brother    Asthma Brother    Heart disease Maternal Grandfather   The patient's father died from heart disease at the age of 97.  The patient's mother is 60 years old  as of March 2022.  The patient has 4 brothers and 2 sisters.  She is not aware of any history of cancer in her family.   GYNECOLOGIC HISTORY:  Patient's last menstrual period was 02/21/2014. Menarche: 63 years old GX P 0 LMP age 23 Contraceptive 1 year, no complications HRT no  Hysterectomy? no BSO? 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  Her husband Zollie Beckers works as a Systems developer for [employer].  He has 2 children from a prior marriage, both living in [place].  One is traveling nurse.  The other 1 is a Naval architect.  The patient is a Scientist, product/process development.    ADVANCED DIRECTIVES: In the absence of any documentation to the contrary, the patient's spouse is their HCPOA.  The patient made it clear at her visit 04/18/2020 that she would not accept any blood transfusion or blood products for any reason including to stave off death   HEALTH MAINTENANCE: Social History   Tobacco Use   Smoking status: Never    Passive exposure: Never   Smokeless tobacco: Never  Vaping Use   Vaping  status: Never Used  Substance Use Topics   Alcohol use: No   Drug use: No     Colonoscopy: 10/2019 (Dr. Tova Fresh), recall 2026  PAP: 03/2017, negative  Bone density: 09/2018, -0.8   Allergies  Allergen Reactions   Lisinopril Shortness Of Breath and Cough    wheezing    Current Outpatient Medications  Medication Sig Dispense Refill   acetaminophen (TYLENOL) 500 MG tablet Take 500 mg by mouth every 6 (six) hours as needed for moderate pain.     albuterol (VENTOLIN HFA) 108 (90 Base) MCG/ACT inhaler Inhale 2 puffs into the lungs every 6 (six) hours as needed for wheezing or shortness of breath. 8 g 0   anastrozole (ARIMIDEX) 1 MG tablet TAKE 1 TABLET BY MOUTH EVERY DAY 30 tablet 5   atorvastatin (LIPITOR) 10 MG tablet Take 1 tablet (10 mg total) by mouth daily. 90 tablet 3   Blood Glucose Monitoring Suppl (ACCU-CHEK GUIDE) w/Device KIT 1 Device by Does not apply route daily  in the afternoon. 1 kit 0   Cholecalciferol 125 MCG (5000 UT) capsule Take 5,000 Units by mouth daily.     glucose blood (ACCU-CHEK GUIDE TEST) test strip 1 each by Other route daily in the afternoon. Use as instructed 100 each 3   loratadine (CLARITIN) 10 MG tablet Take 1 tablet (10 mg total) by mouth daily. 30 tablet 11   Menaquinone-7 (VITAMIN K2) 100 MCG CAPS Take 100 mcg by mouth daily.     metFORMIN (GLUCOPHAGE-XR) 500 MG 24 hr tablet Take 2 tablets (1,000 mg total) by mouth 2 (two) times daily with a meal. 360 tablet 3   Multiple Vitamin (MULTIVITAMIN) LIQD Take 30 mLs by mouth every other day.     Olopatadine HCl 0.6 % SOLN Place 2 sprays into both nostrils 2 (two) times daily as needed (allergies).     omeprazole (PRILOSEC) 40 MG capsule Take 1 capsule (40 mg total) by mouth daily. 30 capsule 2   Polyethylene Glycol 400 (BLINK TEARS OP) Place 1 drop into both eyes daily as needed (dry eyes).     Semaglutide,0.25 or 0.5MG /DOS, 2 MG/3ML SOPN Inject 0.5 mg into the skin once a week. 9 mL 3   No current facility-administered medications for this visit.   SUPPLEMENTS: Aside from the medications listed above, the patient takes a teaspoon of olive oil daily, 1000 mg of vitamin C every other day, sunflower lecithin 600 mg every other day, vitamin D3 5000 mg every other day, magnesium 200 mg every other day, milk thistle 150 mg every other day, a B complex vitamin (B6 and B12) every other day, as well as Osteo Bi-Flex weekly and fluticasone as needed   OBJECTIVE:  Vitals:   05/31/23 1027  BP: 138/71  Pulse: (!) 102  Resp: 18  Temp: (!) 97.4 F (36.3 C)  SpO2: 98%       Body mass index is 30.84 kg/m.   Wt Readings from Last 3 Encounters:  05/31/23 174 lb 1.6 oz (79 kg)  04/26/23 175 lb 3.2 oz (79.5 kg)  04/14/23 171 lb (77.6 kg)  ECOG FS:1 - Symptomatic but completely ambulatory  Physical Exam Constitutional:      Appearance: Normal appearance.  Chest:     Comments: Bilateral  breasts examined. Large and pendulous breasts, Right breast completely healed with scar tissue. No obvious masses or regional adenopathy Musculoskeletal:     Cervical back: Normal range of motion and neck supple. No rigidity.  Lymphadenopathy:  Cervical: No cervical adenopathy.  Neurological:     Mental Status: She is alert.      LAB RESULTS:  CMP     Component Value Date/Time   NA 137 04/14/2023 0821   NA 138 11/04/2017 0000   K 4.7 04/14/2023 0821   CL 99 04/14/2023 0821   CO2 30 04/14/2023 0821   GLUCOSE 117 (H) 04/14/2023 0821   BUN 14 04/14/2023 0821   BUN 14 11/04/2017 0000   CREATININE 0.65 04/14/2023 0821   CREATININE 0.63 01/15/2021 0943   CREATININE 0.60 09/07/2014 0950   CALCIUM 10.7 (H) 04/14/2023 0821   CALCIUM 9.7 10/02/2020 1030   PROT 7.5 04/14/2023 0821   ALBUMIN 4.3 04/14/2023 0821   AST 28 04/14/2023 0821   AST 26 01/15/2021 0943   ALT 28 04/14/2023 0821   ALT 27 01/15/2021 0943   ALKPHOS 160 (H) 04/14/2023 0821   BILITOT 0.5 04/14/2023 0821   BILITOT 0.5 01/15/2021 0943   GFRNONAA >60 04/14/2021 0816   GFRNONAA >60 01/15/2021 0943   GFRAA >60 05/15/2019 1803    No results found for: "TOTALPROTELP", "ALBUMINELP", "A1GS", "A2GS", "BETS", "BETA2SER", "GAMS", "MSPIKE", "SPEI"  Lab Results  Component Value Date   WBC 6.1 04/14/2023   NEUTROABS 3.2 04/14/2023   HGB 13.6 04/14/2023   HCT 42.3 04/14/2023   MCV 93.6 04/14/2023   PLT 284.0 04/14/2023    No results found for: "LABCA2"  No components found for: "WJXBJY782"  No results for input(s): "INR" in the last 168 hours.  No results found for: "LABCA2"  No results found for: "NFA213"  No results found for: "CAN125"  No results found for: "CAN153"  No results found for: "CA2729"  No components found for: "HGQUANT"  No results found for: "CEA1", "CEA" / No results found for: "CEA1", "CEA"   No results found for: "AFPTUMOR"  No results found for: "CHROMOGRNA"  No results  found for: "KPAFRELGTCHN", "LAMBDASER", "KAPLAMBRATIO" (kappa/lambda light chains)  No results found for: "HGBA", "HGBA2QUANT", "HGBFQUANT", "HGBSQUAN" (Hemoglobinopathy evaluation)   No results found for: "LDH"  Lab Results  Component Value Date   IRON 38 (L) 10/16/2009   IRONPCTSAT 10.2 (L) 10/16/2009   (Iron and TIBC)  Lab Results  Component Value Date   FERRITIN 8.9 (L) 10/16/2009    Urinalysis    Component Value Date/Time   COLORURINE YELLOW 10/08/2022 1615   APPEARANCEUR CLEAR 10/08/2022 1615   LABSPEC <=1.005 (A) 10/08/2022 1615   PHURINE 6.5 10/08/2022 1615   GLUCOSEU 500 (A) 10/08/2022 1615   HGBUR NEGATIVE 10/08/2022 1615   BILIRUBINUR NEGATIVE 10/08/2022 1615   BILIRUBINUR N 05/21/2015 1614   KETONESUR NEGATIVE 10/08/2022 1615   PROTEINUR N 05/21/2015 1614   PROTEINUR NEGATIVE 03/11/2009 1100   UROBILINOGEN 0.2 10/08/2022 1615   NITRITE NEGATIVE 10/08/2022 1615   LEUKOCYTESUR SMALL (A) 10/08/2022 1615    STUDIES: No results found.   ELIGIBLE FOR AVAILABLE RESEARCH PROTOCOL: no  ASSESSMENT: 63 y.o. [place] woman status post right breast upper outer quadrant biopsy 04/10/2020 for a clinical T2N0, stage IIA invasive ductal carcinoma, grade 3, estrogen receptor positive, progesterone receptor and HER-2 negative, with an MIB-1 of 25%.  (1) Oncotype obtained from the original biopsy shows a score of 48, predicting a recurrence rate outside the breast within 9 years of 28% if the only systemic therapy is antiestrogen for 5 years.  It also predicts a greater than 15% benefit from chemotherapy.  (2) right lumpectomy and sentinel lymph node dissection 05/21/2020 shows  a pT2 pN0, stage IIB invasive ductal carcinoma, grade 3, with negative margins.  (3) adjuvant chemotherapy to consist of cyclophosphamide and doxorubicin in dose dense fashion x4 starting 06/18/2020, with Neulasta day 3; followed by paclitaxel weekly x12 started 08/28/2020 through 11/13/2020  (a)  echo 05/13/2020 shows an ejection fraction in the 55-60% range  (4) adjuvant radiation 03/27/2021   (5) Started anastrozole March 2023.  PLAN: Assessment & Plan Right breast IDC No concern for breast cancer recurrence. Significant scar tissue causing persistent pain. Imaging showed no abnormalities. Pain expected to improve over time. - Order mammogram for October 2025.  Fatty liver disease On Ozempic for fatty liver disease. - Continue Ozempic. - Monitor liver function tests.  Osteopenia Bone density scan showed very mild osteopenia with T-score of -1.1. Requires lifestyle modifications. - Encourage regular weight-bearing exercise. - Advise daily vitamin D supplementation.  Total time spent: 30 minutes  *Total Encounter Time as defined by the Centers for Medicare and Medicaid Services includes, in addition to the face-to-face time of a patient visit (documented in the note above) non-face-to-face time: obtaining and reviewing outside history, ordering and reviewing medications, tests or procedures, care coordination (communications with other health care professionals or caregivers) and documentation in the medical record.

## 2023-06-18 ENCOUNTER — Other Ambulatory Visit: Payer: Self-pay | Admitting: Hematology and Oncology

## 2023-07-02 HISTORY — PX: CATARACT EXTRACTION: SUR2

## 2023-07-06 ENCOUNTER — Other Ambulatory Visit (HOSPITAL_COMMUNITY)
Admission: RE | Admit: 2023-07-06 | Discharge: 2023-07-06 | Disposition: A | Discharge status: Home / Self Care | Source: Ambulatory Visit | Attending: Obstetrics & Gynecology | Admitting: Obstetrics & Gynecology

## 2023-07-06 ENCOUNTER — Ambulatory Visit (HOSPITAL_BASED_OUTPATIENT_CLINIC_OR_DEPARTMENT_OTHER): Admitting: Obstetrics & Gynecology

## 2023-07-06 ENCOUNTER — Encounter (HOSPITAL_BASED_OUTPATIENT_CLINIC_OR_DEPARTMENT_OTHER): Payer: Self-pay | Admitting: Obstetrics & Gynecology

## 2023-07-06 VITALS — BP 155/90 | HR 78 | Ht 63.0 in | Wt 173.0 lb

## 2023-07-06 DIAGNOSIS — Z01411 Encounter for gynecological examination (general) (routine) with abnormal findings: Secondary | ICD-10-CM

## 2023-07-06 DIAGNOSIS — I10 Essential (primary) hypertension: Secondary | ICD-10-CM | POA: Diagnosis not present

## 2023-07-06 DIAGNOSIS — C50411 Malignant neoplasm of upper-outer quadrant of right female breast: Secondary | ICD-10-CM

## 2023-07-06 DIAGNOSIS — Z01419 Encounter for gynecological examination (general) (routine) without abnormal findings: Secondary | ICD-10-CM

## 2023-07-06 DIAGNOSIS — M85852 Other specified disorders of bone density and structure, left thigh: Secondary | ICD-10-CM

## 2023-07-06 DIAGNOSIS — Z170 Estrogen receptor positive status [ER+]: Secondary | ICD-10-CM

## 2023-07-06 DIAGNOSIS — Z124 Encounter for screening for malignant neoplasm of cervix: Secondary | ICD-10-CM | POA: Insufficient documentation

## 2023-07-06 NOTE — Progress Notes (Signed)
 ANNUAL EXAM Patient name: Yolanda [name] MRN [id number]  Date of birth: [birth year] Chief Complaint:   Annual Exam  History of Present Illness:   Yolanda [name] is a 63 y.o. G0P0 Caucasian female being seen today for a routine annual exam. Denies vaginal bleeding.    H/o breast cancer.  Still on Anastrozole  and will be on this for five years total.  Has been having some right breast pain.  Had MMG last October with breast biopsy that showed necrosis but no abnormal cells.  She had follow up in march.    H/o elevated calcium .  Followed by Dr. Nathalie Baize.    Patient's last menstrual period was 02/21/2014.   Last pap  01/02/2021. Results were: negative.  Last mammogram: 11/17/2022.  She had a biopsy done and follow up again in March, 2025.  Will have 6 months follow up.   Last colonoscopy: 11/15/2019.  Follow up 5 years.  Family h/o colorectal cancer: no     10/08/2022    3:13 PM 01/12/2022    8:58 AM 01/05/2022    8:48 AM 09/01/2021    1:47 PM 05/12/2021   10:35 AM  Depression screen PHQ 2/9  Decreased Interest 0 0 0 0 0  Down, Depressed, Hopeless 0 0 0 0 0  PHQ - 2 Score 0 0 0 0 0  Altered sleeping 0      Tired, decreased energy 0      Change in appetite 0      Feeling bad or failure about yourself  0      Trouble concentrating 0      Moving slowly or fidgety/restless 0      Suicidal thoughts 0      PHQ-9 Score 0      Difficult doing work/chores Not difficult at all            10/08/2022    3:13 PM  GAD 7 : Generalized Anxiety Score  Nervous, Anxious, on Edge 0  Control/stop worrying 0  Worry too much - different things 0  Trouble relaxing 0  Restless 0  Easily annoyed or irritable 0  Afraid - awful might happen 0  Total GAD 7 Score 0  Anxiety Difficulty Not difficult at all     Review of Systems:   Pertinent items are noted in HPI  Denies any new urinary changes except she does have some incontinence.  Denies bowel changed.   Pertinent History Reviewed:   Reviewed past medical,surgical, social and family history.  Reviewed problem list, medications and allergies. Physical Assessment:   Vitals:   07/06/23 1040  BP: (!) 155/90  Pulse: 78  Weight: 173 lb (78.5 kg)  Height: 5\' 3"  (1.6 m)  Body mass index is 30.65 kg/m.        Physical Examination:   General appearance - well appearing, and in no distress  Mental status - alert, oriented to person, place, and time  Psych:  She has a normal mood and affect  Skin - warm and dry, normal color, no suspicious lesions noted  Chest - effort normal, all lung fields clear to auscultation bilaterally  Heart - normal rate and regular rhythm  Neck:  midline trachea, no thyromegaly or nodules  Breasts - right breast with well healed scar that is tight and pulls in the breast tissue.  Dense findings beneath the scar, no suspicious masses, no axillary nodes.  Left breast without masses, skin changes, nipple discharge or LAD  Abdomen -  soft, nontender, nondistended, no masses or organomegaly  Pelvic - VULVA: normal appearing vulva with no masses, tenderness or lesions   VAGINA: normal appearing vagina with normal color and discharge, no lesions   CERVIX: normal appearing cervix without discharge or lesions, no CMT  Thin prep pap is done with HR HPV cotesting  UTERUS: uterus is felt to be normal size, shape, consistency and nontender   ADNEXA: No adnexal masses or tenderness noted.  Rectal - normal rectal, good sphincter tone, no masses felt.  Extremities:  No swelling or varicosities noted  Chaperone present for exam  Assessment & Plan:  1. Well woman exam with routine gynecological exam (Primary) - Pap smear updated today - Mammogram due 11/2023 - Colonoscopy 2021.  Follow up 10 years - Bone mineral density 2023 - lab work done with PCP, Dr. Nathalie Baize - vaccines reviewed/updated  2. Cervical cancer screening - Cytology - PAP( )  3. Malignant neoplasm of upper-outer quadrant of  right breast in female, estrogen receptor positive (HCC) - followed by Dr. Arno Bibles  4. Essential hypertension - on treatment  5. Osteopenia of neck of left femur - DG BONE DENSITY (DXA); Future   Meds: No orders of the defined types were placed in this encounter.   Follow-up: Return in about 1 year (around 07/05/2024).  Lillian Rein, MD 07/06/2023 11:58 AM

## 2023-07-09 LAB — CYTOLOGY - PAP
Comment: NEGATIVE
Diagnosis: NEGATIVE
High risk HPV: NEGATIVE

## 2023-07-14 ENCOUNTER — Ambulatory Visit (HOSPITAL_BASED_OUTPATIENT_CLINIC_OR_DEPARTMENT_OTHER): Payer: Self-pay | Admitting: Obstetrics & Gynecology

## 2023-08-26 ENCOUNTER — Ambulatory Visit: Admitting: Family Medicine

## 2023-08-26 ENCOUNTER — Ambulatory Visit: Payer: Self-pay | Admitting: Family Medicine

## 2023-08-26 ENCOUNTER — Encounter: Payer: Self-pay | Admitting: Family Medicine

## 2023-08-26 VITALS — BP 128/78 | HR 89 | Resp 16 | Ht 63.0 in | Wt 170.6 lb

## 2023-08-26 DIAGNOSIS — E1169 Type 2 diabetes mellitus with other specified complication: Secondary | ICD-10-CM | POA: Diagnosis not present

## 2023-08-26 DIAGNOSIS — E785 Hyperlipidemia, unspecified: Secondary | ICD-10-CM | POA: Diagnosis not present

## 2023-08-26 DIAGNOSIS — E669 Obesity, unspecified: Secondary | ICD-10-CM | POA: Diagnosis not present

## 2023-08-26 DIAGNOSIS — R1903 Right lower quadrant abdominal swelling, mass and lump: Secondary | ICD-10-CM

## 2023-08-26 DIAGNOSIS — I10 Essential (primary) hypertension: Secondary | ICD-10-CM

## 2023-08-26 LAB — LIPID PANEL
Cholesterol: 217 mg/dL — ABNORMAL HIGH (ref 0–200)
HDL: 69.8 mg/dL (ref >39.00)
LDL Cholesterol: 108 mg/dL — ABNORMAL HIGH (ref 0–99)
NonHDL: 147.08
Total CHOL/HDL Ratio: 3
Triglycerides: 195 mg/dL — ABNORMAL HIGH (ref 0.0–149.0)
VLDL: 39 mg/dL (ref 0.0–40.0)

## 2023-08-26 LAB — COMPREHENSIVE METABOLIC PANEL WITH GFR
ALT: 22 U/L (ref 0–35)
AST: 22 U/L (ref 0–37)
Albumin: 4.3 g/dL (ref 3.5–5.2)
Alkaline Phosphatase: 155 U/L — ABNORMAL HIGH (ref 39–117)
BUN: 12 mg/dL (ref 6–23)
CO2: 32 meq/L (ref 19–32)
Calcium: 10.9 mg/dL — ABNORMAL HIGH (ref 8.4–10.5)
Chloride: 101 meq/L (ref 96–112)
Creatinine, Ser: 0.72 mg/dL (ref 0.40–1.20)
GFR: 88.43 mL/min (ref >60.00)
Glucose, Bld: 119 mg/dL — ABNORMAL HIGH (ref 70–99)
Potassium: 5.6 meq/L — ABNORMAL HIGH (ref 3.5–5.1)
Sodium: 139 meq/L (ref 135–145)
Total Bilirubin: 0.3 mg/dL (ref 0.2–1.2)
Total Protein: 7.2 g/dL (ref 6.0–8.3)

## 2023-08-26 LAB — CBC WITH DIFFERENTIAL/PLATELET
Basophils Absolute: 0.1 K/uL (ref 0.0–0.1)
Basophils Relative: 0.8 % (ref 0.0–3.0)
Eosinophils Absolute: 0.3 K/uL (ref 0.0–0.7)
Eosinophils Relative: 4.2 % (ref 0.0–5.0)
HCT: 42.4 % (ref 36.0–46.0)
Hemoglobin: 13.7 g/dL (ref 12.0–15.0)
Lymphocytes Relative: 29.6 % (ref 12.0–46.0)
Lymphs Abs: 2 K/uL (ref 0.7–4.0)
MCHC: 32.3 g/dL (ref 30.0–36.0)
MCV: 91.2 fl (ref 78.0–100.0)
Monocytes Absolute: 0.6 K/uL (ref 0.1–1.0)
Monocytes Relative: 9.2 % (ref 3.0–12.0)
Neutro Abs: 3.8 K/uL (ref 1.4–7.7)
Neutrophils Relative %: 56.2 % (ref 43.0–77.0)
Platelets: 280 K/uL (ref 150.0–400.0)
RBC: 4.65 Mil/uL (ref 3.87–5.11)
RDW: 14.8 % (ref 11.5–15.5)
WBC: 6.7 K/uL (ref 4.0–10.5)

## 2023-08-26 LAB — HEMOGLOBIN A1C: Hgb A1c MFr Bld: 6.6 % — ABNORMAL HIGH (ref 4.6–6.5)

## 2023-08-26 LAB — TSH: TSH: 1.95 u[IU]/mL (ref 0.35–5.50)

## 2023-08-26 LAB — MICROALBUMIN / CREATININE URINE RATIO
Creatinine,U: 159.1 mg/dL
Microalb Creat Ratio: 22.4 mg/g (ref 0.0–30.0)
Microalb, Ur: 3.6 mg/dL — ABNORMAL HIGH (ref 0.0–1.9)

## 2023-08-27 ENCOUNTER — Telehealth (HOSPITAL_BASED_OUTPATIENT_CLINIC_OR_DEPARTMENT_OTHER): Payer: Self-pay

## 2023-08-27 ENCOUNTER — Encounter: Payer: Self-pay | Admitting: Family Medicine

## 2023-08-27 MED ORDER — AMLODIPINE BESYLATE 5 MG PO TABS: 5.0000 mg | ORAL_TABLET | Freq: Every day | ORAL | 1 refills | Status: AC

## 2023-08-27 NOTE — Telephone Encounter (Signed)
 Amlodipine  changed in chart from 2.5 mg to 5 mg and send into pharmacy.

## 2023-08-27 NOTE — Progress Notes (Signed)
 Subjective:    Patient ID: Yolanda [name], female    DOB: 05/29/1960, 63 y.o.   MRN: [id number]  Chief Complaint  Patient presents with   Abdominal Pain    Knot on stomach has been there since the end of June Has not changed in size and is painful [place] it was a bruise but did not hit it so dosnt know what's going on     HPI Discussed the use of AI scribe software for clinical note transcription with the patient, who gave verbal consent to proceed.  History of Present Illness Yolanda [name] is a 63 year old female who presents with a persistent bruise on her abdomen.  She has a persistent bruise on her abdomen that initially appeared as a large bruise at the end of June. The bruise dissipated, but she noticed a hard spot that has not resolved. There is no known trauma to the area, and she initially thought she might have bumped into something but later realized she did not. The bruise is described as 'really dark and blue' and has been present for about two to three weeks. The area is tender to the touch, and she is concerned about the unusual shape of the hard spot, which is more oval than round.  She experiences increased urinary frequency and mild incontinence with sneezing or lifting, which she attributes to aging. There is no urinary urgency or acute changes. Bowel movements occur most days without straining, and she denies seeing blood in her stool. Her appetite is normal, and she experiences no nausea or vomiting unless she consumes junk food. No systemic symptoms such as fevers, chills, or hot flashes.  She is currently taking amlodipine  5 mg daily for hypertension, which was prescribed after a recent episode of high blood pressure that required urgent care. She reports a previous adverse reaction to Rosuvastatin , which she has since discontinued.    Past Medical History:  Diagnosis Date   Anemia May 2022   Chemotherapy   Asthma    with respiratory infection   Cancer  [facility])    breast cancer - right   Cataract March 2018   Optometrist visit   Diabetes mellitus without complication (HCC)    type II   Fatty liver    Fibroid uterus    GERD (gastroesophageal reflux disease) June 2021   Hypertension    Patient reports that she has never been diagnosied with HTN, I take HCTZ for swelling in my feet, if needed.   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   BREAST LUMPECTOMY WITH SENTINEL LYMPH NODE BIOPSY Right 05/21/2020   Procedure: RIGHT BREAST LUMPECTOMY WITH SENTINEL LYMPH NODE BIOPSY;  Surgeon: Aron Shoulders, MD;  Location: [facility];  Service: General;  Laterality: Right;   CATARACT EXTRACTION Left 07/02/2023   CHOLECYSTECTOMY  02/2009   COLONOSCOPY     DILATATION & CURRETTAGE/HYSTEROSCOPY WITH RESECTOCOPE N/A 03/13/2014   Procedure: DILATATION & CURETTAGE/HYSTEROSCOPY ;  Surgeon: Ronal Area  Cleotilde, MD;  Location: [facility];  Service: Gynecology;  Laterality: N/A;   PORTA CATH REMOVAL  01/2021   PORTACATH PLACEMENT Left 05/21/2020   Procedure: INSERTION PORT-A-CATH;  Surgeon: Aron Shoulders, MD;  Location: [facility];  Service: General;  Laterality: Left;   RADIOACTIVE SEED GUIDED EXCISIONAL BREAST BIOPSY Left 12/30/2016   Procedure: RADIOACTIVE SEED GUIDED EXCISIONAL BREAST BIOPSY;  Surgeon: Aron Shoulders, MD;  Location: [facility];  Service: General;  Laterality: Left;    Family History  Problem Relation Age of Onset   Asthma Mother    Diabetes Mother    Diabetes Father    Heart attack Father    Heart disease Father     Heart Problems Sister    Asthma Sister    Hypertension Sister    Diabetes Brother    Diabetes Brother    Asthma Brother    Heart disease Maternal Grandfather     Social History   Socioeconomic History   Marital status: Married    Spouse name: Not on file   Number of children: Not on file   Years of education: Not on file   Highest education level: 12th grade  Occupational History   Not on file  Tobacco Use   Smoking status: Never    Passive exposure: Never   Smokeless tobacco: Never  Vaping Use   Vaping status: Never Used  Substance and Sexual Activity   Alcohol use: No   Drug use: No   Sexual activity: Not Currently    Partners: Male    Birth control/protection: Post-menopausal  Other Topics Concern   Not on file  Social History Narrative   Not on file   Social Drivers of Health   Financial Resource Strain: Low Risk  (08/25/2023)   Overall Financial Resource Strain (CARDIA)    Difficulty of Paying Living Expenses: Not hard at all  Food Insecurity: No Food Insecurity (08/25/2023)   Hunger Vital Sign    Worried About Running Out of Food in the Last Year: Never true    Ran Out of Food in the Last Year: Never true  Transportation Needs: No Transportation Needs (08/25/2023)   PRAPARE - Administrator, Civil Service (Medical): No    Lack of Transportation (Non-Medical): No  Physical Activity: Insufficiently Active (08/25/2023)   Exercise Vital Sign    Days of Exercise per Week: 3 days    Minutes of Exercise per Session: 20 min  Stress: No Stress Concern Present (08/25/2023)   [employer] of Occupational Health - Occupational Stress Questionnaire    Feeling of Stress: Not at all  Social Connections: Socially Integrated (08/25/2023)   Social Connection and Isolation Panel    Frequency of Communication with Friends and Family: More than three times a week    Frequency of Social Gatherings with Friends and Family: More than three times a week    Attends  Religious Services: More than 4 times per year    Active Member of [employer] or Organizations: Yes    Attends Engineer, structural: More than 4 times per year    Marital Status: Married  Catering manager Violence: Not on file    Outpatient Medications Prior to Visit  Medication Sig Dispense Refill   acetaminophen  (TYLENOL ) 500 MG tablet Take 500 mg by mouth every 6 (six) hours as needed for moderate pain.     albuterol  (VENTOLIN  HFA) 108 (90 Base) MCG/ACT inhaler Inhale 2 puffs into the lungs every 6 (six)  hours as needed for wheezing or shortness of breath. 8 g 0   anastrozole  (ARIMIDEX ) 1 MG tablet TAKE 1 TABLET BY MOUTH EVERY DAY 90 tablet 1   Blood Glucose Monitoring Suppl (ACCU-CHEK GUIDE) w/Device KIT 1 Device by Does not apply route daily in the afternoon. 1 kit 0   Cholecalciferol 125 MCG (5000 UT) capsule Take 5,000 Units by mouth daily.     glucose blood (ACCU-CHEK GUIDE TEST) test strip 1 each by Other route daily in the afternoon. Use as instructed 100 each 3   metFORMIN  (GLUCOPHAGE -XR) 500 MG 24 hr tablet Take 2 tablets (1,000 mg total) by mouth 2 (two) times daily with a meal. 360 tablet 3   Multiple Vitamin (MULTIVITAMIN) LIQD Take 30 mLs by mouth every other day.     ofloxacin  (OCUFLOX ) 0.3 % ophthalmic solution      omeprazole  (PRILOSEC) 40 MG capsule Take 1 capsule (40 mg total) by mouth daily. 30 capsule 2   Polyethylene Glycol 400 (BLINK TEARS OP) Place 1 drop into both eyes daily as needed (dry eyes).     prednisoLONE acetate (PRED FORTE) 1 % ophthalmic suspension SMARTSIG:In Eye(s)     Semaglutide ,0.25 or 0.5MG /DOS, 2 MG/3ML SOPN Inject 0.5 mg into the skin once a week. 9 mL 3   amLODipine  (NORVASC ) 2.5 MG tablet Take 1 tablet (2.5 mg total) by mouth daily.     loratadine  (CLARITIN ) 10 MG tablet Take 1 tablet (10 mg total) by mouth daily. (Patient not taking: Reported on 08/26/2023) 30 tablet 11   Menaquinone-7 (VITAMIN K2) 100 MCG CAPS Take 100 mcg by mouth daily.  (Patient not taking: Reported on 08/26/2023)     metformin  (FORTAMET ) 500 MG (OSM) 24 hr tablet 500 mg 2 (two) times daily with a meal.     Olopatadine HCl 0.6 % SOLN Place 2 sprays into both nostrils 2 (two) times daily as needed (allergies). (Patient not taking: Reported on 08/26/2023)     atorvastatin  (LIPITOR) 10 MG tablet Take 1 tablet (10 mg total) by mouth daily. (Patient not taking: Reported on 08/26/2023) 90 tablet 3   No facility-administered medications prior to visit.    Allergies  Allergen Reactions   Lisinopril  Shortness Of Breath and Cough    wheezing   Rosuvastatin  Shortness Of Breath    Review of Systems  Constitutional:  Negative for fever and malaise/fatigue.  HENT:  Negative for congestion.   Eyes:  Negative for blurred vision.  Respiratory:  Negative for shortness of breath.   Cardiovascular:  Negative for chest pain, palpitations and leg swelling.  Gastrointestinal:  Positive for abdominal pain. Negative for blood in stool and nausea.  Genitourinary:  Negative for dysuria and frequency.  Musculoskeletal:  Positive for myalgias. Negative for falls.  Skin:  Negative for rash.  Neurological:  Negative for dizziness, loss of consciousness and headaches.  Endo/Heme/Allergies:  Negative for environmental allergies. Bruises/bleeds easily.  Psychiatric/Behavioral:  Negative for depression. The patient is not nervous/anxious.        Objective:    Physical Exam Constitutional:      General: She is not in acute distress.    Appearance: Normal appearance. She is well-developed. She is not toxic-appearing.  HENT:     Head: Normocephalic and atraumatic.     Right Ear: External ear normal.     Left Ear: External ear normal.     Nose: Nose normal.  Eyes:     General:        Right eye:  No discharge.        Left eye: No discharge.     Conjunctiva/sclera: Conjunctivae normal.  Neck:     Thyroid : No thyromegaly.  Cardiovascular:     Rate and Rhythm: Normal rate and  regular rhythm.     Heart sounds: Normal heart sounds. No murmur heard. Pulmonary:     Effort: Pulmonary effort is normal. No respiratory distress.     Breath sounds: Normal breath sounds.  Abdominal:     General: Bowel sounds are normal.     Palpations: Abdomen is soft.     Tenderness: There is no abdominal tenderness. There is no guarding.     Comments: RLQ skin with fading bruise roughly 3 cm in diameter, in center is an oblong firm nodule roughly 1x1/2 cm lesion, mobile and circumscribed  Musculoskeletal:        General: Normal range of motion.     Cervical back: Neck supple.  Lymphadenopathy:     Cervical: No cervical adenopathy.  Skin:    General: Skin is warm and dry.  Neurological:     Mental Status: She is alert and oriented to person, place, and time.  Psychiatric:        Mood and Affect: Mood normal.        Behavior: Behavior normal.        Thought Content: Thought content normal.        Judgment: Judgment normal.     BP 128/78   Pulse 89   Resp 16   Ht 5' 3 (1.6 m)   Wt 170 lb 9.6 oz (77.4 kg)   LMP 02/21/2014   SpO2 98%   BMI 30.22 kg/m  Wt Readings from Last 3 Encounters:  08/26/23 170 lb 9.6 oz (77.4 kg)  07/06/23 173 lb (78.5 kg)  05/31/23 174 lb 1.6 oz (79 kg)    Diabetic Foot Exam - Simple   No data filed    Lab Results  Component Value Date   WBC 6.7 08/26/2023   HGB 13.7 08/26/2023   HCT 42.4 08/26/2023   PLT 280.0 08/26/2023   GLUCOSE 119 (H) 08/26/2023   CHOL 217 (H) 08/26/2023   TRIG 195.0 (H) 08/26/2023   HDL 69.80 08/26/2023   LDLDIRECT 101.0 10/17/2020   LDLCALC 108 (H) 08/26/2023   ALT 22 08/26/2023   AST 22 08/26/2023   NA 139 08/26/2023   K 5.6 No hemolysis seen (H) 08/26/2023   CL 101 08/26/2023   CREATININE 0.72 08/26/2023   BUN 12 08/26/2023   CO2 32 08/26/2023   TSH 1.95 08/26/2023   INR 0.9 02/12/2022   HGBA1C 6.6 (H) 08/26/2023   MICROALBUR 3.6 (H) 08/26/2023    Lab Results  Component Value Date   TSH 1.95  08/26/2023   Lab Results  Component Value Date   WBC 6.7 08/26/2023   HGB 13.7 08/26/2023   HCT 42.4 08/26/2023   MCV 91.2 08/26/2023   PLT 280.0 08/26/2023   Lab Results  Component Value Date   NA 139 08/26/2023   K 5.6 No hemolysis seen (H) 08/26/2023   CO2 32 08/26/2023   GLUCOSE 119 (H) 08/26/2023   BUN 12 08/26/2023   CREATININE 0.72 08/26/2023   BILITOT 0.3 08/26/2023   ALKPHOS 155 (H) 08/26/2023   AST 22 08/26/2023   ALT 22 08/26/2023   PROT 7.2 08/26/2023   ALBUMIN  4.3 08/26/2023   CALCIUM  10.9 (H) 08/26/2023   ANIONGAP 6 04/14/2021   GFR 88.43 08/26/2023  Lab Results  Component Value Date   CHOL 217 (H) 08/26/2023   Lab Results  Component Value Date   HDL 69.80 08/26/2023   Lab Results  Component Value Date   LDLCALC 108 (H) 08/26/2023   Lab Results  Component Value Date   TRIG 195.0 (H) 08/26/2023   Lab Results  Component Value Date   CHOLHDL 3 08/26/2023   Lab Results  Component Value Date   HGBA1C 6.6 (H) 08/26/2023       Assessment & Plan:  RLQ abdominal mass -     US  Abdomen Complete; Future  Dyslipidemia -     Lipid panel  Essential hypertension -     Comprehensive metabolic panel with GFR -     CBC with Differential/Platelet -     TSH  Type 2 diabetes mellitus with obesity (HCC) -     Hemoglobin A1c -     Microalbumin / creatinine urine ratio    Assessment and Plan Assessment & Plan Abdominal Bruise with Nodule Persistent bruise with tender nodule in right lower quadrant. Suspected reactive process, differential includes calcium  deposit or reactive lymph node. - Order ultrasound of right lower quadrant. - Advise to report increased tenderness, growth, bowel changes, or fever.  Hypertension Blood pressure improved to 128/78 on amlodipine  5 mg daily. - Continue amlodipine  5 mg daily. - Confirm amlodipine  strength at home. - Follow up in August for blood pressure and medication review.  Hyperlipidemia Previous adverse  reaction to Rosuvastatin . Considering alternative statin. - Remove Rosuvastatin  from medication list. - Consider rosuvastatin  pending further information on previous reaction.  General Health Maintenance Routine blood work due for review in August. Last done in February. - Order routine blood work before August appointment. - Discuss results during August follow-up.     Harlene Horton, MD

## 2023-09-01 ENCOUNTER — Other Ambulatory Visit: Payer: Self-pay | Admitting: Family Medicine

## 2023-09-01 ENCOUNTER — Ambulatory Visit: Payer: Self-pay | Admitting: Family Medicine

## 2023-09-01 ENCOUNTER — Ambulatory Visit (HOSPITAL_BASED_OUTPATIENT_CLINIC_OR_DEPARTMENT_OTHER)
Admission: RE | Admit: 2023-09-01 | Discharge: 2023-09-01 | Disposition: A | Discharge status: Home / Self Care | Source: Ambulatory Visit | Attending: Family Medicine | Admitting: Family Medicine

## 2023-09-01 DIAGNOSIS — I10 Essential (primary) hypertension: Secondary | ICD-10-CM

## 2023-09-01 DIAGNOSIS — E785 Hyperlipidemia, unspecified: Secondary | ICD-10-CM

## 2023-09-01 DIAGNOSIS — R1903 Right lower quadrant abdominal swelling, mass and lump: Secondary | ICD-10-CM

## 2023-09-01 DIAGNOSIS — E1169 Type 2 diabetes mellitus with other specified complication: Secondary | ICD-10-CM

## 2023-09-22 NOTE — Progress Notes (Unsigned)
 Subjective:     Patient ID: Yolanda [name], female    DOB: 1960-03-05, 63 y.o.   MRN: [id number]  No chief complaint on file.   HPI  Hc Breast Ca She follows with Dr. Kristie for NASH She continues to follow-up with surgical oncology and oncology  for history of breast cancer   Hypercalcemia- follows with Endocrinology Per Endo-DDx likely primary hyperparathyroidism.  History of Present Illness              Health Maintenance Due  Topic Date Due   Pneumococcal Vaccine: 19-49 Years (3 of 3 - PCV20 or PCV21) 02/01/2023   Pneumococcal Vaccine: 50+ Years (3 of 3 - PCV20 or PCV21) 02/01/2023   FOOT EXAM  04/24/2023   INFLUENZA VACCINE  09/17/2023    Past Medical History:  Diagnosis Date   Anemia May 2022   Chemotherapy   Asthma    with respiratory infection   Cancer [facility])    breast cancer - right   Cataract March 2018   Optometrist visit   Diabetes mellitus without complication (HCC)    type II   Fatty liver    Fibroid uterus    GERD (gastroesophageal reflux disease) June 2021   Hypertension    Patient reports that she has never been diagnosied with HTN, I take HCTZ for swelling in my feet, if needed.   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   BREAST LUMPECTOMY WITH SENTINEL LYMPH NODE BIOPSY Right 05/21/2020   Procedure: RIGHT BREAST LUMPECTOMY WITH SENTINEL LYMPH NODE BIOPSY;  Surgeon: Aron Shoulders, MD;  Location: [facility];  Service: General;  Laterality: Right;   CATARACT EXTRACTION Left 07/02/2023   CHOLECYSTECTOMY  02/2009    COLONOSCOPY     DILATATION & CURRETTAGE/HYSTEROSCOPY WITH RESECTOCOPE N/A 03/13/2014   Procedure: DILATATION & CURETTAGE/HYSTEROSCOPY ;  Surgeon: Ronal Elvie Pinal, MD;  Location: [facility];  Service: Gynecology;  Laterality: N/A;   PORTA CATH REMOVAL  01/2021   PORTACATH PLACEMENT Left 05/21/2020   Procedure: INSERTION PORT-A-CATH;  Surgeon: Aron Shoulders, MD;  Location: [facility];  Service: General;  Laterality: Left;   RADIOACTIVE SEED GUIDED EXCISIONAL BREAST BIOPSY Left 12/30/2016   Procedure: RADIOACTIVE SEED GUIDED EXCISIONAL BREAST BIOPSY;  Surgeon: Aron Shoulders, MD;  Location: [facility];  Service: General;  Laterality: Left;    Family History  Problem Relation Age of Onset   Asthma Mother    Diabetes Mother    Diabetes Father    Heart attack Father    Heart disease Father    Heart Problems Sister    Asthma Sister    Hypertension Sister    Diabetes Brother    Diabetes Brother    Asthma Brother    Heart disease Maternal Grandfather     Social History   Socioeconomic History   Marital status: Married    Spouse name: Not on file   Number of children: Not on file   Years of education: Not  on file   Highest education level: 12th grade  Occupational History   Not on file  Tobacco Use   Smoking status: Never    Passive exposure: Never   Smokeless tobacco: Never  Vaping Use   Vaping status: Never Used  Substance and Sexual Activity   Alcohol use: No   Drug use: No   Sexual activity: Not Currently    Partners: Male    Birth control/protection: Post-menopausal  Other Topics Concern   Not on file  Social History Narrative   Not on file   Social Drivers of Health   Financial Resource Strain: Low Risk  (08/25/2023)   Overall Financial Resource Strain (CARDIA)    Difficulty of Paying Living Expenses: Not hard at all  Food Insecurity: No Food Insecurity (08/25/2023)   Hunger Vital Sign    Worried About Running Out of Food in the Last Year: Never true     Ran Out of Food in the Last Year: Never true  Transportation Needs: No Transportation Needs (08/25/2023)   PRAPARE - Administrator, Civil Service (Medical): No    Lack of Transportation (Non-Medical): No  Physical Activity: Insufficiently Active (08/25/2023)   Exercise Vital Sign    Days of Exercise per Week: 3 days    Minutes of Exercise per Session: 20 min  Stress: No Stress Concern Present (08/25/2023)   [employer] of Occupational Health - Occupational Stress Questionnaire    Feeling of Stress: Not at all  Social Connections: Socially Integrated (08/25/2023)   Social Connection and Isolation Panel    Frequency of Communication with Friends and Family: More than three times a week    Frequency of Social Gatherings with Friends and Family: More than three times a week    Attends Religious Services: More than 4 times per year    Active Member of [employer] or Organizations: Yes    Attends Engineer, structural: More than 4 times per year    Marital Status: Married  Catering manager Violence: Not on file    Outpatient Medications Prior to Visit  Medication Sig Dispense Refill   acetaminophen  (TYLENOL ) 500 MG tablet Take 500 mg by mouth every 6 (six) hours as needed for moderate pain.     albuterol  (VENTOLIN  HFA) 108 (90 Base) MCG/ACT inhaler Inhale 2 puffs into the lungs every 6 (six) hours as needed for wheezing or shortness of breath. 8 g 0   amLODipine  (NORVASC ) 5 MG tablet Take 1 tablet (5 mg total) by mouth daily. 90 tablet 1   anastrozole  (ARIMIDEX ) 1 MG tablet TAKE 1 TABLET BY MOUTH EVERY DAY 90 tablet 1   Blood Glucose Monitoring Suppl (ACCU-CHEK GUIDE) w/Device KIT 1 Device by Does not apply route daily in the afternoon. 1 kit 0   Cholecalciferol 125 MCG (5000 UT) capsule Take 5,000 Units by mouth daily.     glucose blood (ACCU-CHEK GUIDE TEST) test strip 1 each by Other route daily in the afternoon. Use as instructed 100 each 3   metFORMIN  (GLUCOPHAGE -XR) 500 MG  24 hr tablet Take 2 tablets (1,000 mg total) by mouth 2 (two) times daily with a meal. 360 tablet 3   Multiple Vitamin (MULTIVITAMIN) LIQD Take 30 mLs by mouth every other day.     ofloxacin  (OCUFLOX ) 0.3 % ophthalmic solution      omeprazole  (PRILOSEC) 40 MG capsule Take 1 capsule (40 mg total) by mouth daily. 30 capsule 2   Polyethylene Glycol 400 (BLINK TEARS OP)  Place 1 drop into both eyes daily as needed (dry eyes).     prednisoLONE acetate (PRED FORTE) 1 % ophthalmic suspension SMARTSIG:In Eye(s)     Semaglutide ,0.25 or 0.5MG /DOS, 2 MG/3ML SOPN Inject 0.5 mg into the skin once a week. 9 mL 3   No facility-administered medications prior to visit.    Allergies  Allergen Reactions   Lisinopril  Shortness Of Breath and Cough    wheezing   Rosuvastatin  Shortness Of Breath    ROS     Objective:    Physical Exam   LMP 02/21/2014  Wt Readings from Last 3 Encounters:  08/26/23 170 lb 9.6 oz (77.4 kg)  07/06/23 173 lb (78.5 kg)  05/31/23 174 lb 1.6 oz (79 kg)       Assessment & Plan:   Problem List Items Addressed This Visit   None   Nonalcoholic Steatohepatitis (NASH) Grade 3 fatty liver confirmed on biopsy in December 2023. Discussed the importance of weight loss and maintaining a healthy diet to manage NASH. -Encourage hydration, stress management, and a minimum of 4000 steps per day.   I am having Yolanda [name] maintain her multivitamin, omeprazole , Cholecalciferol, acetaminophen , Polyethylene Glycol 400 (BLINK TEARS OP), albuterol , Accu-Chek Guide, Accu-Chek Guide Test, metFORMIN , Semaglutide (0.25 or 0.5MG /DOS), anastrozole , prednisoLONE acetate, ofloxacin , and amLODipine .  No orders of the defined types were placed in this encounter.

## 2023-09-23 ENCOUNTER — Ambulatory Visit (INDEPENDENT_AMBULATORY_CARE_PROVIDER_SITE_OTHER): Admitting: Student

## 2023-09-23 ENCOUNTER — Encounter: Payer: Self-pay | Admitting: Student

## 2023-09-23 ENCOUNTER — Ambulatory Visit: Payer: Medicaid Other | Admitting: Family Medicine

## 2023-09-23 VITALS — BP 130/80 | HR 76 | Resp 16 | Ht 63.0 in | Wt 172.8 lb

## 2023-09-23 DIAGNOSIS — E785 Hyperlipidemia, unspecified: Secondary | ICD-10-CM

## 2023-09-23 DIAGNOSIS — K219 Gastro-esophageal reflux disease without esophagitis: Secondary | ICD-10-CM

## 2023-09-23 DIAGNOSIS — E875 Hyperkalemia: Secondary | ICD-10-CM | POA: Diagnosis not present

## 2023-09-23 DIAGNOSIS — Z7985 Long-term (current) use of injectable non-insulin antidiabetic drugs: Secondary | ICD-10-CM

## 2023-09-23 DIAGNOSIS — Z0000 Encounter for general adult medical examination without abnormal findings: Secondary | ICD-10-CM

## 2023-09-23 DIAGNOSIS — I10 Essential (primary) hypertension: Secondary | ICD-10-CM

## 2023-09-23 DIAGNOSIS — E119 Type 2 diabetes mellitus without complications: Secondary | ICD-10-CM

## 2023-09-23 MED ORDER — SEMAGLUTIDE (1 MG/DOSE) 4 MG/3ML ~~LOC~~ SOPN: 1.0000 mg | PEN_INJECTOR | SUBCUTANEOUS | 1 refills | Status: DC

## 2023-09-23 MED ORDER — SEMAGLUTIDE (1 MG/DOSE) 4 MG/3ML ~~LOC~~ SOPN: 1.0000 mg | PEN_INJECTOR | SUBCUTANEOUS | 0 refills | Status: DC

## 2023-09-23 MED ORDER — OMEPRAZOLE 20 MG PO CPDR
20.0000 mg | DELAYED_RELEASE_CAPSULE | Freq: Every day | ORAL | 3 refills | Status: DC
Start: 2023-09-23 — End: 2023-12-30

## 2023-09-23 NOTE — Assessment & Plan Note (Signed)
 Encourage heart healthy diet such as MIND or DASH diet, increase exercise, avoid trans fats, simple carbohydrates and processed foods, consider a krill or fish or flaxseed oil cap daily.

## 2023-09-23 NOTE — Assessment & Plan Note (Addendum)
 hgba1c acceptable, minimize simple carbs. Increase exercise as tolerated.  On metformin  and semaglutide . Weight loss has plateaued on current dose semaglutide , increase semaglutide  to 1 mg injection weekly.

## 2023-09-23 NOTE — Assessment & Plan Note (Addendum)
 C/O uncontrolled GERD symptoms.  She has not been taking her PPI medication. Rx- omeprazole  20 mg daily.  Return to clinic if symptoms persist or worsen.

## 2023-09-23 NOTE — Assessment & Plan Note (Signed)
 Hydrate and monitor.  Follows with endocrinology, per Endo likely due to hyper PTH.

## 2023-09-23 NOTE — Assessment & Plan Note (Signed)
 Patient encouraged to maintain heart healthy diet, regular exercise, adequate sleep. Consider daily probiotics. Take medications as prescribed.   HCM: -Mammogram: Followed by Onc/Heme -Pap: Last 06/2023- WNL; next due 2030- follows with Gyn Dr. Cleotilde -DEXA: Ordered by GYN - Colonoscopy: Last 10/2019, Repeat 5 years- 2026 - Immunizations: PNA due

## 2023-09-23 NOTE — Assessment & Plan Note (Signed)
 Well controlled, no changes to meds. Encouraged heart healthy diet such as the DASH diet and exercise as tolerated.

## 2023-09-24 LAB — HEPATIC FUNCTION PANEL
ALT: 25 U/L (ref 0–35)
AST: 23 U/L (ref 0–37)
Albumin: 4.1 g/dL (ref 3.5–5.2)
Alkaline Phosphatase: 127 U/L — ABNORMAL HIGH (ref 39–117)
Bilirubin, Direct: 0 mg/dL (ref 0.0–0.3)
Total Bilirubin: 0.3 mg/dL (ref 0.2–1.2)
Total Protein: 6.9 g/dL (ref 6.0–8.3)

## 2023-09-24 LAB — COMPREHENSIVE METABOLIC PANEL WITH GFR
ALT: 25 U/L (ref 0–35)
AST: 23 U/L (ref 0–37)
Albumin: 4.1 g/dL (ref 3.5–5.2)
Alkaline Phosphatase: 127 U/L — ABNORMAL HIGH (ref 39–117)
BUN: 12 mg/dL (ref 6–23)
CO2: 28 meq/L (ref 19–32)
Calcium: 10.7 mg/dL — ABNORMAL HIGH (ref 8.4–10.5)
Chloride: 101 meq/L (ref 96–112)
Creatinine, Ser: 0.7 mg/dL (ref 0.40–1.20)
GFR: 91.41 mL/min (ref >60.00)
Glucose, Bld: 96 mg/dL (ref 70–99)
Potassium: 4.7 meq/L (ref 3.5–5.1)
Sodium: 139 meq/L (ref 135–145)
Total Bilirubin: 0.3 mg/dL (ref 0.2–1.2)
Total Protein: 6.9 g/dL (ref 6.0–8.3)

## 2023-09-26 ENCOUNTER — Ambulatory Visit: Payer: Self-pay | Admitting: Student

## 2023-10-04 ENCOUNTER — Ambulatory Visit (INDEPENDENT_AMBULATORY_CARE_PROVIDER_SITE_OTHER): Payer: 59 | Admitting: Otolaryngology

## 2023-10-04 ENCOUNTER — Encounter (INDEPENDENT_AMBULATORY_CARE_PROVIDER_SITE_OTHER): Payer: Self-pay | Admitting: Otolaryngology

## 2023-10-04 VITALS — BP 117/78 | HR 98

## 2023-10-04 DIAGNOSIS — H6123 Impacted cerumen, bilateral: Secondary | ICD-10-CM

## 2023-10-04 DIAGNOSIS — H905 Unspecified sensorineural hearing loss: Secondary | ICD-10-CM | POA: Diagnosis not present

## 2023-10-04 DIAGNOSIS — B369 Superficial mycosis, unspecified: Secondary | ICD-10-CM

## 2023-10-04 DIAGNOSIS — H6241 Otitis externa in other diseases classified elsewhere, right ear: Secondary | ICD-10-CM

## 2023-10-04 DIAGNOSIS — H903 Sensorineural hearing loss, bilateral: Secondary | ICD-10-CM

## 2023-10-04 MED ORDER — ACETIC ACID 2 % OT SOLN: 6.0000 [drp] | OTIC | 1 refills | Status: AC

## 2023-10-04 NOTE — Progress Notes (Signed)
 ENT PROGRESS NOTE:  Update 10/04/23 She reports intermittently itchy ears, but no changes in her hearing. She denies ear pain or drainage.   Update 10/05/22: She had Audiogram and it showed SNHL in high-freq c/w noise-induced hearing loss/age related hearing WRS 100%. She has itchy ears.  No ear pain. No ear drainage.   Original HPI:   Reason for Consult: Left ear pain and drainage concern for ear infection   HPI: Yolanda [name] is an 63 y.o. female with hx of left ear pain and bilateral ear drainage x 2 week. She has a hx of left ear infections, last ear infection was 2-3 yrs ago. She is diabetic, on oral medications. No prior ear surgeries. She feels hearing is muffled. She had audiogram 20 yrs ago, and at the time it was normal. Denies vertigo, but has mild buzzing in the left ear since she started having ear sx 2 weeks ago. No medications or ear drops. Denies URI or swimming recently.    Records Reviewed:  On Metformin   On PPI for reflux Hx of breast cancer  stage IIA Hx of OSA and NASH    Past Medical History:  Diagnosis Date   Anemia May 2022   Chemotherapy   Asthma    with respiratory infection   Cancer [facility])    breast cancer - right   Cataract March 2018   Optometrist visit   Diabetes mellitus without complication (HCC)    type II   Fatty liver    Fibroid uterus    GERD (gastroesophageal reflux disease) June 2021   Hypertension    Patient reports that she has never been diagnosied with HTN, I take HCTZ for swelling in my feet, if needed.   Osteoarthritis 03/10/2015   PONV (postoperative nausea and vomiting)    2014   Refusal of blood transfusions as patient is Jehovah's Witness    NO BLOOD PRODUCTS   Sleep apnea     Past Surgical History:  Procedure Laterality Date   BREAST BIOPSY Right 11/17/2022   US  RT BREAST BX W LOC DEV 1ST LESION IMG BX SPEC US  GUIDE 11/17/2022 [facility]   BREAST CYST ASPIRATION     BREAST EXCISIONAL BIOPSY     BREAST  LUMPECTOMY Right 05/2020   IDC with DCIS   BREAST LUMPECTOMY WITH SENTINEL LYMPH NODE BIOPSY Right 05/21/2020   Procedure: RIGHT BREAST LUMPECTOMY WITH SENTINEL LYMPH NODE BIOPSY;  Surgeon: Aron Shoulders, MD;  Location: [facility];  Service: General;  Laterality: Right;   CATARACT EXTRACTION Left 07/02/2023   CHOLECYSTECTOMY  02/2009   COLONOSCOPY     DILATATION & CURRETTAGE/HYSTEROSCOPY WITH RESECTOCOPE N/A 03/13/2014   Procedure: DILATATION & CURETTAGE/HYSTEROSCOPY ;  Surgeon: Ronal Elvie Pinal, MD;  Location: [facility];  Service: Gynecology;  Laterality: N/A;   PORTA CATH REMOVAL  01/2021   PORTACATH PLACEMENT Left 05/21/2020   Procedure: INSERTION PORT-A-CATH;  Surgeon: Aron Shoulders, MD;  Location: [facility];  Service: General;  Laterality: Left;   RADIOACTIVE SEED GUIDED EXCISIONAL BREAST BIOPSY Left 12/30/2016   Procedure: RADIOACTIVE SEED GUIDED EXCISIONAL BREAST BIOPSY;  Surgeon: Aron Shoulders, MD;  Location: [facility];  Service: General;  Laterality: Left;    Family History  Problem Relation Age of Onset   Asthma Mother    Diabetes Mother    Diabetes Father    Heart attack Father    Heart disease Father    Heart Problems Sister    Asthma Sister    Hypertension  Sister    Diabetes Brother    Diabetes Brother    Asthma Brother    Heart disease Maternal Grandfather     Social History:  reports that she has never smoked. She has never been exposed to tobacco smoke. She has never used smokeless tobacco. She reports that she does not drink alcohol and does not use drugs.  Allergies:  Allergies  Allergen Reactions   Lisinopril  Shortness Of Breath and Cough    wheezing   Rosuvastatin  Shortness Of Breath    Medications: I have reviewed the patient's current medications.  No results found for this or any previous visit (from the past 48 hours).  No results found.  The PMH, PSH, Medications, Allergies, and SH were reviewed and updated.  ROS: Constitutional:  Negative for fever, weight loss and weight gain. Cardiovascular: Negative for chest pain and dyspnea on exertion. Respiratory: Is not experiencing shortness of breath at rest. Gastrointestinal: Negative for nausea and vomiting. Neurological: Negative for headaches. Psychiatric: The patient is not nervous/anxious  Blood pressure 117/78, pulse 98, last menstrual period 02/21/2014, SpO2 93%.  PHYSICAL EXAM:  Exam: General: Well-developed, well-nourished Communication and Voice: Clear pitch and clarity Respiratory Respiratory effort: Equal inspiration and expiration without stridor Cardiovascular Eyes: No nystagmus with equal extraocular motion bilaterally Neuro/Psych/Balance: Patient oriented to person, place, and time; Appropriate mood and affect; Gait is intact with no imbalance; Cranial nerves I-XII are intact Head and Face Inspection: Normocephalic and atraumatic without mass or lesion Facial Strength: Facial motility symmetric and full bilaterally ENT Pinna: External ear intact and fully developed External canal: Canal is patent with intact skin after removal of cerumen with white spores (small amount) Tympanic Membrane: Clear and mobile External Nose: No scar or anatomic deformity    Procedure:  Procedure: Cerumen Removal, Bilateral (CPT I2977979)  Diagnosis: cerumen impaction, bilateral   Informed consent: Timeout performed and informed consent was obtained.  Procedure: Operating microscope was employed to evaluate the ear(s).  Cerumen curette, speculum and suction were employed to clear the cerumen.   Findings: Normal appearing tympanic membranes without perforations, and external canals are normal after removal of cerumen.No middle ear fluid bilaterally. White fungal hyphea mixed with cerumen on the right. Removed.   Complications: None. Patient tolerated well.    Studies Reviewed:none  Assessment/Plan: Encounter Diagnoses  Name Primary?   Sensorineural hearing  loss (SNHL) of both ears Yes   Bilateral impacted cerumen    Otitis externa, fungal, right ear      63 year old female with history of breast cancer of the right breast history of OSA nasal congestion who presents with recent episodes of left ear pain and drainage which improved at this point.  She was told that she has cerumen impaction.  Her ear drainage is mild and she denies severe ear pain, dizziness or vertigo.  No prior ear surgeries.  Exam consistent with cerumen impaction on both sides, she feels hearing is somewhat improved after bilateral cerumen impaction removal today.  Will order screening audiogram to exclude sensorineural hearing loss for other causes of conductive versus mixed hearing loss and she will return for follow-up after testing.   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 She has itchy ears.  No pain or ear drainage. She had evidence of fungal hyphae on the right side today along with bilateral cerumen impaction and dry skin debris, which we cleared with instrumentation. She did not have canal edema and no evidence of middle ear effusion.   - continue Flonase  and Zyrtec for nasal congestion - Use Clotrimazole  ggt to the right ear for 5 days then stop - keep the ears dry - switch to using Sweet Oil twice a week for itchy ears after you finish 5 days of Clotrimazole  - return in 6-12 months for repeat ear check (or sooner if develop other ENT sx or recurrence of sx)   Update 10/04/23  Cerumen impaction - s/p cleaning today, cerumen mixed with white spores c/w fungus but no edema or erythema of the ear canal - sweet oil and  keep ears dry  Fungal otitis externa - acetic acid  drops b/l x 14 days then stop   SNHL  - denies any changes in her hearing  - hearing test in 1 year   Elena Larry, MD Otolaryngology [facility] Phone: [phone] Fax: [phone]    10/04/2023, 9:09 AM

## 2023-10-18 ENCOUNTER — Encounter: Payer: Self-pay | Admitting: Family Medicine

## 2023-10-18 DIAGNOSIS — J309 Allergic rhinitis, unspecified: Secondary | ICD-10-CM

## 2023-10-19 MED ORDER — ALBUTEROL SULFATE HFA 108 (90 BASE) MCG/ACT IN AERS: 2.0000 | INHALATION_SPRAY | Freq: Four times a day (QID) | RESPIRATORY_TRACT | 0 refills | Status: DC | PRN

## 2023-10-21 ENCOUNTER — Other Ambulatory Visit: Payer: Self-pay

## 2023-10-21 ENCOUNTER — Telehealth: Payer: Self-pay

## 2023-10-21 ENCOUNTER — Ambulatory Visit
Admission: RE | Admit: 2023-10-21 | Discharge: 2023-10-21 | Disposition: A | Discharge status: Home / Self Care | Source: Ambulatory Visit

## 2023-10-21 VITALS — BP 166/90 | HR 95 | Temp 97.8°F | Resp 16

## 2023-10-21 DIAGNOSIS — J069 Acute upper respiratory infection, unspecified: Secondary | ICD-10-CM

## 2023-10-21 DIAGNOSIS — J309 Allergic rhinitis, unspecified: Secondary | ICD-10-CM

## 2023-10-21 LAB — POC COVID19/FLU A&B COMBO
Covid Antigen, POC: NEGATIVE
Influenza A Antigen, POC: NEGATIVE
Influenza B Antigen, POC: NEGATIVE

## 2023-10-21 MED ORDER — ALBUTEROL SULFATE HFA 108 (90 BASE) MCG/ACT IN AERS: 1.0000 | INHALATION_SPRAY | Freq: Four times a day (QID) | RESPIRATORY_TRACT | 0 refills | Status: AC | PRN

## 2023-10-21 MED ORDER — PREDNISONE 20 MG PO TABS: 40.0000 mg | ORAL_TABLET | Freq: Every day | ORAL | 0 refills | Status: AC

## 2023-10-21 MED ORDER — AZELASTINE HCL 0.1 % NA SOLN: 1.0000 | Freq: Two times a day (BID) | NASAL | 1 refills | Status: AC

## 2023-10-21 NOTE — Discharge Instructions (Signed)
  1. Viral upper respiratory tract infection (Primary) - POC Covid19/Flu A&B Antigen completed in UC is negative for COVID and influenza. - albuterol  (VENTOLIN  HFA) 108 (90 Base) MCG/ACT inhaler; Inhale 1-2 puffs into the lungs every 6 (six) hours as needed for wheezing or shortness of breath.  Dispense: 8 g; Refill: 0 - predniSONE  (DELTASONE ) 20 MG tablet; Take 2 tablets (40 mg total) by mouth daily for 5 days.  Dispense: 10 tablet; Refill: 0 - azelastine  (ASTELIN ) 0.1 % nasal spray; Place 1 spray into both nostrils 2 (two) times daily. Use in each nostril as directed  Dispense: 30 mL; Refill: 1

## 2023-10-21 NOTE — Telephone Encounter (Signed)
 Copied from CRM [id number]. Topic: Clinical - Medical Advice >> Oct 21, 2023  8:41 AM Rea ORN wrote: Reason for CRM: Pt stated she was sick over the weekend with sneezing, and bad cough. Pt stated OTC cough meds not working. She stated she feels congested and would like to speak to PCP's nurse.

## 2023-10-21 NOTE — ED Triage Notes (Signed)
 C/O sneezing onset 5 days ago; started with non-productive cough onset 2 days ago with a lot of chest congestion. C/O slight HA. Has been taking old albuterol  Rx and Delsym. Denies fevers.

## 2023-10-21 NOTE — ED Provider Notes (Signed)
 [facility]  Note:  This document was prepared using Dragon voice recognition software and may include unintentional dictation errors.  MRN: [id number] DOB: 1960-04-30  Subjective:   Yolanda [name] is a 63 y.o. female presenting for nonproductive cough, nasal congestion, chest congestion x 2 days.  Patient also reports having a mild headache.  Patient reports that prior to the onset of current symptoms she had sneezing for about 3 days.  Patient has been taking albuterol  and Delsym cough suppressant with moderate improvement.  Patient denies any fever, shortness of breath, chest pain, weakness, dizziness.  Denies any known sick contacts  No current facility-administered medications for this encounter.  Current Outpatient Medications:    anastrozole  (ARIMIDEX ) 1 MG tablet, TAKE 1 TABLET BY MOUTH EVERY DAY, Disp: 90 tablet, Rfl: 1   azelastine  (ASTELIN ) 0.1 % nasal spray, Place 1 spray into both nostrils 2 (two) times daily. Use in each nostril as directed, Disp: 30 mL, Rfl: 1   Cholecalciferol 125 MCG (5000 UT) capsule, Take 5,000 Units by mouth daily., Disp: , Rfl:    metFORMIN  (GLUCOPHAGE -XR) 500 MG 24 hr tablet, Take 2 tablets (1,000 mg total) by mouth 2 (two) times daily with a meal., Disp: 360 tablet, Rfl: 3   Multiple Vitamin (MULTIVITAMIN) LIQD, Take 30 mLs by mouth every other day., Disp: , Rfl:    omeprazole  (PRILOSEC) 20 MG capsule, Take 1 capsule (20 mg total) by mouth daily., Disp: 30 capsule, Rfl: 3   predniSONE  (DELTASONE ) 20 MG tablet, Take 2 tablets (40 mg total) by mouth daily for 5 days., Disp: 10 tablet, Rfl: 0   Semaglutide , 1 MG/DOSE, 4 MG/3ML SOPN, Inject 1 mg as directed once a week., Disp: 9 mL, Rfl: 0   acetaminophen  (TYLENOL ) 500 MG tablet, Take 500 mg by mouth every 6 (six) hours as needed for moderate pain., Disp: , Rfl:    acetic acid  2 % otic solution, Place 6 drops into both ears every 3 (three) hours., Disp: 15 mL, Rfl: 1   albuterol   (VENTOLIN  HFA) 108 (90 Base) MCG/ACT inhaler, Inhale 1-2 puffs into the lungs every 6 (six) hours as needed for wheezing or shortness of breath., Disp: 8 g, Rfl: 0   amLODipine  (NORVASC ) 5 MG tablet, Take 1 tablet (5 mg total) by mouth daily., Disp: 90 tablet, Rfl: 1   Blood Glucose Monitoring Suppl (ACCU-CHEK GUIDE) w/Device KIT, 1 Device by Does not apply route daily in the afternoon., Disp: 1 kit, Rfl: 0   glucose blood (ACCU-CHEK GUIDE TEST) test strip, 1 each by Other route daily in the afternoon. Use as instructed, Disp: 100 each, Rfl: 3   Polyethylene Glycol 400 (BLINK TEARS OP), Place 1 drop into both eyes daily as needed (dry eyes)., Disp: , Rfl:    Allergies  Allergen Reactions   Lisinopril  Shortness Of Breath and Cough    wheezing   Rosuvastatin  Shortness Of Breath    Past Medical History:  Diagnosis Date   Anemia May 2022   Chemotherapy   Asthma    with respiratory infection   Cancer [facility])    breast cancer - right   Cataract March 2018   Optometrist visit   Diabetes mellitus without complication (HCC)    type II   Fatty liver    Fibroid uterus    GERD (gastroesophageal reflux disease) June 2021   Hypertension    Patient reports that she has never been diagnosied with HTN, I take HCTZ for swelling in my  feet, if needed.   Osteoarthritis 03/10/2015   PONV (postoperative nausea and vomiting)    2014   Refusal of blood transfusions as patient is Jehovah's Witness    NO BLOOD PRODUCTS   Sleep apnea      Past Surgical History:  Procedure Laterality Date   BREAST BIOPSY Right 11/17/2022   US  RT BREAST BX W LOC DEV 1ST LESION IMG BX SPEC US  GUIDE 11/17/2022 [facility]MO[facility]AP[facility]   BREAST CYST ASPIRATION     BREAST EXCISIONAL BIOPSY     BREAST LUMPECTOMY Right 05/2020   IDC with DCIS   BREAST LUMPECTOMY WITH SENTINEL LYMPH NODE BIOPSY Right 05/21/2020   Procedure: RIGHT BREAST LUMPECTOMY WITH SENTINEL LYMPH NODE BIOPSY;  Surgeon: Aron Shoulders, MD;  Location: [facility];   Service: General;  Laterality: Right;   CATARACT EXTRACTION Left 07/02/2023   CHOLECYSTECTOMY  02/2009   COLONOSCOPY     DILATATION & CURRETTAGE/HYSTEROSCOPY WITH RESECTOCOPE N/A 03/13/2014   Procedure: DILATATION & CURETTAGE/HYSTEROSCOPY ;  Surgeon: Ronal Elvie Pinal, MD;  Location: [facility];  Service: Gynecology;  Laterality: N/A;   PORTA CATH REMOVAL  01/2021   PORTACATH PLACEMENT Left 05/21/2020   Procedure: INSERTION PORT-A-CATH;  Surgeon: Aron Shoulders, MD;  Location: [facility];  Service: General;  Laterality: Left;   RADIOACTIVE SEED GUIDED EXCISIONAL BREAST BIOPSY Left 12/30/2016   Procedure: RADIOACTIVE SEED GUIDED EXCISIONAL BREAST BIOPSY;  Surgeon: Aron Shoulders, MD;  Location: [facility];  Service: General;  Laterality: Left;    Family History  Problem Relation Age of Onset   Asthma Mother    Diabetes Mother    Diabetes Father    Heart attack Father    Heart disease Father    Heart Problems Sister    Asthma Sister    Hypertension Sister    Diabetes Brother    Diabetes Brother    Asthma Brother    Heart disease Maternal Grandfather     Social History   Tobacco Use   Smoking status: Never    Passive exposure: Never   Smokeless tobacco: Never  Vaping Use   Vaping status: Never Used  Substance Use Topics   Alcohol use: No   Drug use: No    ROS Refer to HPI for ROS details.  Objective:    Vitals: BP (!) 166/90 Comment: was supposed to be on meds, but then BP became too low, so pt stopped  Pulse 95   Temp 97.8 F (36.6 C) (Oral)   Resp 16   LMP 02/21/2014   SpO2 96%   Physical Exam Vitals and nursing note reviewed.  Constitutional:      General: She is not in acute distress.    Appearance: Normal appearance. She is well-developed. She is not ill-appearing or toxic-appearing.  HENT:     Head: Normocephalic and atraumatic.     Nose: Congestion present. No rhinorrhea.     Mouth/Throat:     Mouth: Mucous membranes are moist.     Pharynx:  Oropharynx is clear.  Eyes:     General:        Right eye: No discharge.        Left eye: No discharge.     Extraocular Movements: Extraocular movements intact.     Conjunctiva/sclera: Conjunctivae normal.  Cardiovascular:     Rate and Rhythm: Normal rate and regular rhythm.     Heart sounds: Normal heart sounds. No murmur heard. Pulmonary:     Effort: Pulmonary effort is normal. No  respiratory distress.     Breath sounds: No stridor. Wheezing present. No rhonchi or rales.  Chest:     Chest wall: No tenderness.  Skin:    General: Skin is warm and dry.  Neurological:     General: No focal deficit present.     Mental Status: She is alert and oriented to person, place, and time.  Psychiatric:        Mood and Affect: Mood normal.        Behavior: Behavior normal.     Procedures  Results for orders placed or performed during the hospital encounter of 10/21/23 (from the past 24 hours)  POC Covid19/Flu A&B Antigen     Status: None   Collection Time: 10/21/23  1:41 PM  Result Value Ref Range   Influenza A Antigen, POC Negative Negative   Influenza B Antigen, POC Negative Negative   Covid Antigen, POC Negative Negative    Assessment and Plan :     Discharge Instructions       1. Viral upper respiratory tract infection (Primary) - POC Covid19/Flu A&B Antigen completed in UC is negative for COVID and influenza. - albuterol  (VENTOLIN  HFA) 108 (90 Base) MCG/ACT inhaler; Inhale 1-2 puffs into the lungs every 6 (six) hours as needed for wheezing or shortness of breath.  Dispense: 8 g; Refill: 0 - predniSONE  (DELTASONE ) 20 MG tablet; Take 2 tablets (40 mg total) by mouth daily for 5 days.  Dispense: 10 tablet; Refill: 0 - azelastine  (ASTELIN ) 0.1 % nasal spray; Place 1 spray into both nostrils 2 (two) times daily. Use in each nostril as directed  Dispense: 30 mL; Refill: 1      Radhika Dershem B Rutherford, Myosha Cuadras B, NP 10/21/23 1350

## 2023-10-26 NOTE — Telephone Encounter (Signed)
 Called patient and she reports call was last week and she went to the urgent care. Advised patient message was send to the wrong basket and sorry for the mix up. Patient reports she was feeling better and it was fine.

## 2023-10-27 ENCOUNTER — Encounter: Payer: Self-pay | Admitting: Internal Medicine

## 2023-10-27 ENCOUNTER — Ambulatory Visit (INDEPENDENT_AMBULATORY_CARE_PROVIDER_SITE_OTHER): Admitting: Internal Medicine

## 2023-10-27 VITALS — BP 122/70 | HR 77 | Ht 63.0 in | Wt 171.4 lb

## 2023-10-27 DIAGNOSIS — E119 Type 2 diabetes mellitus without complications: Secondary | ICD-10-CM | POA: Diagnosis not present

## 2023-10-27 DIAGNOSIS — Z7984 Long term (current) use of oral hypoglycemic drugs: Secondary | ICD-10-CM

## 2023-10-27 DIAGNOSIS — E785 Hyperlipidemia, unspecified: Secondary | ICD-10-CM | POA: Diagnosis not present

## 2023-10-27 LAB — POCT GLYCOSYLATED HEMOGLOBIN (HGB A1C): Hemoglobin A1C: 6.1 % — AB (ref 4.0–5.6)

## 2023-10-27 MED ORDER — METFORMIN HCL ER 500 MG PO TB24: 500.0000 mg | ORAL_TABLET | Freq: Two times a day (BID) | ORAL | 3 refills | Status: AC

## 2023-10-27 NOTE — Patient Instructions (Addendum)
-   Continue Ozempic  1 mg weekly - Decrease  Metformin  500 mg, 1 tablet with Breakfast and 1 tablet with Supper   24-Hour Urine Collection  You will be collecting your urine for a 24-hour period of time. Your timer starts with your first urine of the morning (For example - If you first pee at 9AM, your timer will start at 9AM) Throw away your first urine of the morning Collect your urine every time you pee for the next 24 hours STOP your urine collection 24 hours after you started the collection (For example - You would stop at 9AM the day after you started)   HOW TO TREAT LOW BLOOD SUGARS (Blood sugar LESS THAN 70 MG/DL) Please follow the RULE OF 15 for the treatment of hypoglycemia treatment (when your (blood sugars are less than 70 mg/dL)   STEP 1: Take 15 grams of carbohydrates when your blood sugar is low, which includes:  3-4 GLUCOSE TABS  OR 3-4 OZ OF JUICE OR REGULAR SODA OR ONE TUBE OF GLUCOSE GEL    STEP 2: RECHECK blood sugar in 15 MINUTES STEP 3: If your blood sugar is still low at the 15 minute recheck --> then, go back to STEP 1 and treat AGAIN with another 15 grams of carbohydrates.

## 2023-10-27 NOTE — Progress Notes (Signed)
 Name: Yolanda [name]  MRN/ DOB: [id number], Feb 10, 1961   Age/ Sex: 63 y.o., female    PCP: Domenica Harlene LABOR, MD   Reason for Endocrinology Evaluation: Type 2 Diabetes Mellitus     Date of Initial Endocrinology Visit: 08/20/2020    PATIENT IDENTIFIER: Yolanda [name] is a 63 y.o. female with a past medical history of T2DM, Asthma, Breast ca and NASH ( S/P Bx 2024) . The patient presented for initial endocrinology clinic visit on 08/20/2020 for consultative assistance with her diabetes management.    Pt on adjuvant therapy for right Breast ca started 06/2020 ( cyclophosphamide , doxorubicin  with pegfilgrastim  on day 3 )but  she started weekly paclitaxel  08/21/2020.   DIABETIC HISTORY:  Yolanda [name] was diagnosed with DM in 2015. She has been on metformin  only since her diagnosis.  Her hemoglobin A1c has ranged from 6.9% in 2019, peaking at 7.5% in 2020.   On her initial visit to our clinic she had an A1c of 7.4%, she was on metformin  only, she has noted hyperglycemia during chemotherapy, we started Jardiance  at the time.  Started Ozempic  10/2022 due to NASH diagnosis and the need for weight loss  Discontinued Jardiance  10/2022 due to recurrent genital infections    LIPID HISTORY: I have restarted her on rosuvastatin  in March, 2025 due to dyslipidemia.  She contacted the office a month later attributing shortness of breath to rosuvastatin , literature research did not reveal shortness of breath with statin therapy unless there is a history of asthma, which the patient did not have  I switched her to atorvastatin  in April, 2025 but her insurance will not cover  Patient we attempted to use rosuvastatin  in September, 2025    HYPERCALCEMIA HISTORY: Patient has been noted with hypercalcemia  during routine labs 03/2022 at 10.7 Mg/DL (Corrected 89.51 mg/dL) with elevated PTH at 94 PG/mL    SUBJECTIVE:   During the last visit (04/26/2023): A1c 6.7%   Today (10/27/23): Yolanda [name] is  here for follow-up on diabetes management.   She follows with Dr. Kristie for NASH She continues to follow-up with surgical oncology and oncology  for history of breast cancer  Patient presented to urgent care for viral infection 10/21/2023, she was prescribed albuterol  inhaler as well as prednisone  taper   She checks her blood sugars 1x daily  Pt  NO nausea  Has alternating constipation and diarrhea  No polydipsia Has urinary frequency  She recently started OTC liquid multivitamin that contains calcium  Per patient, her insurance will not cover atorvastatin   HOME DIABETES REGIMEN: Metformin  500 mg XR 2 tabs BID  Ozempic  1 mg weekly     Statin: yes ACE-I/ARB: no- Allergic to lisinopril   Prior Diabetic Education: no   METER DOWNLOAD SUMMARY: 8/12-9/11/2023 Fingerstick Blood Glucose Tests = 10 Average Number Tests/Day = 0 Overall Mean FS Glucose = 104 Standard Deviation = 20  BG Ranges: Low = 85 High = 146  BG Target % Results: % In target = 100 % Over target = 0 % Under target = 0  Hypoglycemic Events/30 Days: BG < 50 = 0 Episodes of symptomatic severe hypoglycemia = 0    DIABETIC COMPLICATIONS: Microvascular complications:   Denies: CKD, neuropathy, retinopathy  Last eye exam: Completed 05/20/2023  Macrovascular complications:   Denies: CAD, PVD, CVA   PAST HISTORY: Past Medical History:  Past Medical History:  Diagnosis Date   Anemia May 2022   Chemotherapy   Asthma    with respiratory infection  Cancer [facility])    breast cancer - right   Cataract March 2018   Optometrist visit   Diabetes mellitus without complication (HCC)    type II   Fatty liver    Fibroid uterus    GERD (gastroesophageal reflux disease) June 2021   Hypertension    Patient reports that she has never been diagnosied with HTN, I take HCTZ for swelling in my feet, if needed.   Osteoarthritis 03/10/2015   PONV (postoperative nausea and vomiting)    2014   Refusal of blood  transfusions as patient is Jehovah's Witness    NO BLOOD PRODUCTS   Sleep apnea    Past Surgical History:  Past Surgical History:  Procedure Laterality Date   BREAST BIOPSY Right 11/17/2022   US  RT BREAST BX W LOC DEV 1ST LESION IMG BX SPEC US  GUIDE 11/17/2022 [facility] M[facility]MO[facility]AP[facility]   BREAST CYST ASPIRATION     BREAST EXCISIONAL BIOPSY     BREAST LUMPECTOMY Right 05/2020   IDC with DCIS   BREAST LUMPECTOMY WITH SENTINEL LYMPH NODE BIOPSY Right 05/21/2020   Procedure: RIGHT BREAST LUMPECTOMY WITH SENTINEL LYMPH NODE BIOPSY;  Surgeon: Aron Shoulders, MD;  Location: [facility];  Service: General;  Laterality: Right;   CATARACT EXTRACTION Left 07/02/2023   CHOLECYSTECTOMY  02/2009   COLONOSCOPY     DILATATION & CURRETTAGE/HYSTEROSCOPY WITH RESECTOCOPE N/A 03/13/2014   Procedure: DILATATION & CURETTAGE/HYSTEROSCOPY ;  Surgeon: Ronal Elvie Pinal, MD;  Location: [facility];  Service: Gynecology;  Laterality: N/A;   PORTA CATH REMOVAL  01/2021   PORTACATH PLACEMENT Left 05/21/2020   Procedure: INSERTION PORT-A-CATH;  Surgeon: Aron Shoulders, MD;  Location: [facility];  Service: General;  Laterality: Left;   RADIOACTIVE SEED GUIDED EXCISIONAL BREAST BIOPSY Left 12/30/2016   Procedure: RADIOACTIVE SEED GUIDED EXCISIONAL BREAST BIOPSY;  Surgeon: Aron Shoulders, MD;  Location: [facility];  Service: General;  Laterality: Left;    Social History:  reports that she has never smoked. She has never been exposed to tobacco smoke. She has never used smokeless tobacco. She reports that she does not drink alcohol and does not use drugs. Family History:  Family History  Problem Relation Age of Onset   Asthma Mother    Diabetes Mother    Diabetes Father    Heart attack Father    Heart disease Father    Heart Problems Sister    Asthma Sister    Hypertension Sister    Diabetes Brother    Diabetes Brother    Asthma Brother    Heart disease Maternal Grandfather      HOME MEDICATIONS: Allergies as of  10/27/2023       Reactions   Lisinopril  Shortness Of Breath, Cough   wheezing   Rosuvastatin  Shortness Of Breath        Medication List        Accurate as of October 27, 2023  9:28 AM. If you have any questions, ask your nurse or doctor.          Accu-Chek Guide Test test strip Generic drug: glucose blood 1 each by Other route daily in the afternoon. Use as instructed   Accu-Chek Guide w/Device Kit 1 Device by Does not apply route daily in the afternoon.   acetaminophen  500 MG tablet Commonly known as: TYLENOL  Take 500 mg by mouth every 6 (six) hours as needed for moderate pain.   acetic acid  2 % otic solution Place 6 drops into both ears every 3 (  three) hours.   albuterol  108 (90 Base) MCG/ACT inhaler Commonly known as: VENTOLIN  HFA Inhale 1-2 puffs into the lungs every 6 (six) hours as needed for wheezing or shortness of breath.   amLODipine  5 MG tablet Commonly known as: NORVASC  Take 1 tablet (5 mg total) by mouth daily.   anastrozole  1 MG tablet Commonly known as: ARIMIDEX  TAKE 1 TABLET BY MOUTH EVERY DAY   azelastine  0.1 % nasal spray Commonly known as: ASTELIN  Place 1 spray into both nostrils 2 (two) times daily. Use in each nostril as directed   BLINK TEARS OP Place 1 drop into both eyes daily as needed (dry eyes).   Cholecalciferol 125 MCG (5000 UT) capsule Take 5,000 Units by mouth daily.   metFORMIN  500 MG 24 hr tablet Commonly known as: GLUCOPHAGE -XR Take 2 tablets (1,000 mg total) by mouth 2 (two) times daily with a meal.   multivitamin Liqd Take 30 mLs by mouth every other day.   omeprazole  20 MG capsule Commonly known as: PRILOSEC Take 1 capsule (20 mg total) by mouth daily.   Semaglutide  (1 MG/DOSE) 4 MG/3ML Sopn Inject 1 mg as directed once a week.         ALLERGIES: Allergies  Allergen Reactions   Lisinopril  Shortness Of Breath and Cough    wheezing   Rosuvastatin  Shortness Of Breath     REVIEW OF SYSTEMS: A  comprehensive ROS was conducted with the patient and is negative except as per HPI     OBJECTIVE:   VITAL SIGNS: BP 122/70 (BP Location: Left Arm, Patient Position: Sitting)   Pulse 77   Ht 5' 3 (1.6 m)   Wt 171 lb 6.4 oz (77.7 kg)   LMP 02/21/2014   SpO2 98%   BMI 30.36 kg/m    PHYSICAL EXAM:  General: Pt appears well and is in NAD  Lungs: Clear with good BS bilat   Heart: RRR   Abdomen: soft, nontender  Extremities:  Lower extremities - No pretibial edema.  Neuro: MS is good with appropriate affect, pt is alert and Ox3    DM foot exam: 10/27/2023  The skin of the feet is intact without sores or ulcerations. The pedal pulses are 2+ on right and 2+ on left. The sensation is intact to a screening 5.07, 10 gram monofilament bilaterally   DATA REVIEWED:  Lab Results  Component Value Date   HGBA1C 6.6 (H) 08/26/2023   HGBA1C 6.7 (H) 04/14/2023   HGBA1C 6.6 (H) 10/08/2022     Latest Reference Range & Units 09/23/23 16:13  Sodium 135 - 145 mEq/L 139  Potassium 3.5 - 5.1 mEq/L 4.7  Chloride 96 - 112 mEq/L 101  CO2 19 - 32 mEq/L 28  Glucose 70 - 99 mg/dL 96  BUN 6 - 23 mg/dL 12  Creatinine 9.59 - 8.79 mg/dL 9.29  Calcium  8.4 - 10.5 mg/dL 89.2 (H)  Alkaline Phosphatase 39 - 117 U/L 39 - 117 U/L 127 (H) 127 (H)  Albumin  3.5 - 5.2 g/dL 3.5 - 5.2 g/dL 4.1 4.1  AST 0 - 37 U/L 0 - 37 U/L 23 23  ALT 0 - 35 U/L 0 - 35 U/L 25 25  Total Protein 6.0 - 8.3 g/dL 6.0 - 8.3 g/dL 6.9 6.9  Bilirubin, Direct 0.0 - 0.3 mg/dL 0.0  Total Bilirubin 0.2 - 1.2 mg/dL 0.2 - 1.2 mg/dL 0.3 0.3  GFR >39.99 mL/min 91.41     Latest Reference Range & Units 08/26/23 11:25  Creatinine,U mg/dL  159.1  Microalb, Ur 0.0 - 1.9 mg/dL 3.6 (H)  MICROALB/CREAT RATIO 0.0 - 30.0 mg/g 22.4  (H): Data is abnormally high   Old records , labs and images have been reviewed.    ASSESSMENT / PLAN / RECOMMENDATIONS:   1) Type 2 Diabetes Mellitus, Optimally controlled, Without complications -  Most recent A1c of 6.1 %. Goal A1c < 7.0 %.    -A1c remains within optimal range -Intolerant to Jardiance  due to severe genital irritation -I will decrease metformin  due to an A1c of 6.1% MEDICATIONS:   Continue Ozempic  1mg  weekly Decrease metformin  500 mg, 1 tablets with Breakfast and 1 tablet with Supper    EDUCATION / INSTRUCTIONS: BG monitoring instructions: Patient is instructed to check her blood sugars 1 times a day, fasting . Call [facility] if: BG persistently < 70  I reviewed the Rule of 15 for the treatment of hypoglycemia in detail with the patient. Literature supplied.   2) Diabetic complications:  Eye: Does not have known diabetic retinopathy.  Neuro/ Feet: Does not have known diabetic peripheral neuropathy. Renal: Patient does not have known baseline CKD. She is not on an ACEI/ARB at present.    3) Dyslipidemia :  -LDL and TG remain above goal - I had started her on rosuvastatin  5 mg, but she developed shortness of breath. - Her insurance would not cover atorvastatin  - She would like to retry using the rosuvastatin    Medication We start rosuvastatin  5 mg daily   4) Hypercalcemia:  - Most likely primary hyperparathyroidism -Serum calcium  and slightly elevated -Patient asymptomatic -DXA scan 2023 showed osteopenia - Patient advised to discontinue multivitamin and to avoid any OTC calcium  supplements - Will proceed with 24-hour urinary calcium  excretion  Recommendations Stay hydrated Avoid over-the-counter calcium  tablets Consume 2-3 servings of dietary calcium  daily   F/U in 6 months     Signed electronically by: Stefano Redgie Butts, MD  [facility]  [facility] [address] [place] [place], [place] [address] Phone: [phone] FAX: [phone]   CC: Domenica Harlene LABOR, MD [address] [place] [address] Phone: [phone]  Fax: [phone]    Return to  Endocrinology clinic as below: Future Appointments  Date Time Provider Department Center  10/27/2023  9:30 AM Madisan Bice, Donell Redgie, MD [facility] None  02/14/2024 11:20 AM Domenica Harlene LABOR, MD [facility] [address]  06/01/2024  9:45 AM Loretha Ash, MD [facility] None  07/13/2024  9:35 AM Cleotilde Ronal RAMAN, MD [facility] [phone] [place]

## 2023-10-29 ENCOUNTER — Other Ambulatory Visit

## 2023-10-29 ENCOUNTER — Telehealth (HOSPITAL_BASED_OUTPATIENT_CLINIC_OR_DEPARTMENT_OTHER): Payer: Self-pay | Admitting: Obstetrics & Gynecology

## 2023-10-29 DIAGNOSIS — M85852 Other specified disorders of bone density and structure, left thigh: Secondary | ICD-10-CM

## 2023-10-29 NOTE — Telephone Encounter (Signed)
 Spoke with patient. Patient would like to have her bone density scan performed at [facility] location. New order placed. Patient is aware she will be contacted to schedule.

## 2023-10-29 NOTE — Telephone Encounter (Signed)
 New message   The patient called, stating that the breast center is no longer performing bone density tests. She requested that the referral be sent to another facility.

## 2023-10-30 LAB — CALCIUM, 24-HOUR URINE WITH CREATININE
CALCIUM/CREATININE RATIO: 327 mg/g{creat} — ABNORMAL HIGH (ref 30–275)
Calcium, 24H Urine: 322 mg/(24.h) — ABNORMAL HIGH
Creatinine, 24H Ur: 0.98 g/(24.h) (ref 0.50–2.15)

## 2023-11-03 ENCOUNTER — Ambulatory Visit: Payer: Self-pay | Admitting: Internal Medicine

## 2023-11-09 ENCOUNTER — Encounter: Payer: Self-pay | Admitting: Emergency Medicine

## 2023-11-09 ENCOUNTER — Ambulatory Visit: Admission: EM | Admit: 2023-11-09 | Discharge: 2023-11-09 | Disposition: A | Discharge status: Home / Self Care

## 2023-11-09 DIAGNOSIS — S60011A Contusion of right thumb without damage to nail, initial encounter: Secondary | ICD-10-CM

## 2023-11-09 NOTE — ED Triage Notes (Addendum)
 Pt c/o right thumb bruising and slight swelling. States she noticed it today when she tried to push cart at [employer] and had some tenderness. There was not bruising at that time. Pain is minimal 1/10. Denies any known injury

## 2023-11-09 NOTE — ED Provider Notes (Addendum)
 [facility]  Note:  This document was prepared using Dragon voice recognition software and may include unintentional dictation errors.  MRN: [id number] DOB: [birth year]  Subjective:   Yolanda [name] is a 63 y.o. female presenting for right thumb emergency slight swelling since this morning patient reports that she was at [facility], pushing a shopping cart when she noticed slight pain to right thumb and mild bruising.  Patient reports that the bruising has expanded and she is now having some swelling.  Patient denies any known injury or trauma.  Patient reports that pain is only 1 out of 10.  Has not taken any over-the-counter medication to treat symptoms.  Patient denies any history of clotting disorder, not currently taking any anticoagulants or aspirin.  No current facility-administered medications for this encounter.  Current Outpatient Medications:    acetaminophen  (TYLENOL ) 500 MG tablet, Take 500 mg by mouth every 6 (six) hours as needed for moderate pain., Disp: , Rfl:    acetic acid  2 % otic solution, Place 6 drops into both ears every 3 (three) hours., Disp: 15 mL, Rfl: 1   albuterol  (VENTOLIN  HFA) 108 (90 Base) MCG/ACT inhaler, Inhale 1-2 puffs into the lungs every 6 (six) hours as needed for wheezing or shortness of breath., Disp: 8 g, Rfl: 0   amLODipine  (NORVASC ) 5 MG tablet, Take 1 tablet (5 mg total) by mouth daily. (Patient not taking: Reported on 10/27/2023), Disp: 90 tablet, Rfl: 1   anastrozole  (ARIMIDEX ) 1 MG tablet, TAKE 1 TABLET BY MOUTH EVERY DAY, Disp: 90 tablet, Rfl: 1   azelastine  (ASTELIN ) 0.1 % nasal spray, Place 1 spray into both nostrils 2 (two) times daily. Use in each nostril as directed, Disp: 30 mL, Rfl: 1   Blood Glucose Monitoring Suppl (ACCU-CHEK GUIDE) w/Device KIT, 1 Device by Does not apply route daily in the afternoon., Disp: 1 kit, Rfl: 0   Cholecalciferol 125 MCG (5000 UT) capsule, Take 5,000 Units by mouth daily., Disp: , Rfl:     glucose blood (ACCU-CHEK GUIDE TEST) test strip, 1 each by Other route daily in the afternoon. Use as instructed, Disp: 100 each, Rfl: 3   metFORMIN  (GLUCOPHAGE -XR) 500 MG 24 hr tablet, Take 1 tablet (500 mg total) by mouth 2 (two) times daily with a meal., Disp: 180 tablet, Rfl: 3   Multiple Vitamin (MULTIVITAMIN) LIQD, Take 30 mLs by mouth every other day., Disp: , Rfl:    omeprazole  (PRILOSEC) 20 MG capsule, Take 1 capsule (20 mg total) by mouth daily., Disp: 30 capsule, Rfl: 3   Polyethylene Glycol 400 (BLINK TEARS OP), Place 1 drop into both eyes daily as needed (dry eyes)., Disp: , Rfl:    Semaglutide , 1 MG/DOSE, 4 MG/3ML SOPN, Inject 1 mg as directed once a week., Disp: 9 mL, Rfl: 0   Allergies  Allergen Reactions   Lisinopril  Shortness Of Breath and Cough    wheezing   Rosuvastatin  Shortness Of Breath    Past Medical History:  Diagnosis Date   Anemia May 2022   Chemotherapy   Asthma    with respiratory infection   Cancer [facility])    breast cancer - right   Cataract March 2018   Optometrist visit   Diabetes mellitus without complication (HCC)    type II   Fatty liver    Fibroid uterus    GERD (gastroesophageal reflux disease) June 2021   Hypertension    Patient reports that she has never been diagnosied with HTN, I take  HCTZ for swelling in my feet, if needed.   Osteoarthritis 03/10/2015   PONV (postoperative nausea and vomiting)    2014   Refusal of blood transfusions as patient is Jehovah's Witness    NO BLOOD PRODUCTS   Sleep apnea      Past Surgical History:  Procedure Laterality Date   BREAST BIOPSY Right 11/17/2022   US  RT BREAST BX W LOC DEV 1ST LESION IMG BX SPEC US  GUIDE 11/17/2022 [facility] MAMMOGRAPHY   BREAST CYST ASPIRATION     BREAST EXCISIONAL BIOPSY     BREAST LUMPECTOMY Right 05/2020   IDC with DCIS   BREAST LUMPECTOMY WITH SENTINEL LYMPH NODE BIOPSY Right 05/21/2020   Procedure: RIGHT BREAST LUMPECTOMY WITH SENTINEL LYMPH NODE BIOPSY;  Surgeon:  Aron Shoulders, MD;  Location: [facility];  Service: General;  Laterality: Right;   CATARACT EXTRACTION Left 07/02/2023   CHOLECYSTECTOMY  02/2009   COLONOSCOPY     DILATATION & CURRETTAGE/HYSTEROSCOPY WITH RESECTOCOPE N/A 03/13/2014   Procedure: DILATATION & CURETTAGE/HYSTEROSCOPY ;  Surgeon: Ronal Elvie Pinal, MD;  Location: [facility];  Service: Gynecology;  Laterality: N/A;   PORTA CATH REMOVAL  01/2021   PORTACATH PLACEMENT Left 05/21/2020   Procedure: INSERTION PORT-A-CATH;  Surgeon: Aron Shoulders, MD;  Location: [facility];  Service: General;  Laterality: Left;   RADIOACTIVE SEED GUIDED EXCISIONAL BREAST BIOPSY Left 12/30/2016   Procedure: RADIOACTIVE SEED GUIDED EXCISIONAL BREAST BIOPSY;  Surgeon: Aron Shoulders, MD;  Location: [facility];  Service: General;  Laterality: Left;    Family History  Problem Relation Age of Onset   Asthma Mother    Diabetes Mother    Diabetes Father    Heart attack Father    Heart disease Father    Heart Problems Sister    Asthma Sister    Hypertension Sister    Diabetes Brother    Diabetes Brother    Asthma Brother    Heart disease Maternal Grandfather     Social History   Tobacco Use   Smoking status: Never    Passive exposure: Never   Smokeless tobacco: Never  Vaping Use   Vaping status: Never Used  Substance Use Topics   Alcohol use: No   Drug use: No    ROS Refer to HPI for ROS details.  Objective:   Vitals: BP 134/87 (BP Location: Right Arm)   Pulse 93   Temp 98.3 F (36.8 C) (Oral)   Resp 16   LMP 02/21/2014   SpO2 95%   Physical Exam Vitals and nursing note reviewed.  Constitutional:      General: She is not in acute distress.    Appearance: Normal appearance. She is not ill-appearing.  HENT:     Head: Normocephalic.  Cardiovascular:     Rate and Rhythm: Normal rate.  Pulmonary:     Effort: Pulmonary effort is normal. No respiratory distress.  Skin:    General: Skin is warm and dry.     Capillary  Refill: Capillary refill takes less than 2 seconds.     Findings: Bruising (Right thumb, no significant sign of trauma or injury.) present. No abrasion, erythema, signs of injury, rash or wound.  Neurological:     General: No focal deficit present.     Mental Status: She is alert and oriented to person, place, and time.  Psychiatric:        Mood and Affect: Mood normal.        Behavior: Behavior normal.  Procedures  No results found for this or any previous visit (from the past 24 hours).  No results found.   Assessment and Plan :     Discharge Instructions       1. Contusion of right thumb without damage to nail, initial encounter (Primary) - Bruising and swelling most likely secondary to subcutaneous blood vessel rupture. - Apply ice 2-3 times a day for 10 to 15 minutes at a time to help with inflammation and pain. - Continue to monitor symptoms for any changes.  If any escalation of current symptoms or symptoms follow-up for further evaluation management.       Jadynn Epping B Meghanne Pletz   Bricyn Labrada, Harvey B, NP 11/09/23 1257    Aurea Goodell B, NP 11/09/23 1259

## 2023-11-09 NOTE — Discharge Instructions (Addendum)
  1. Contusion of right thumb without damage to nail, initial encounter (Primary) - Bruising and swelling most likely secondary to subcutaneous blood vessel rupture. - Apply ice 2-3 times a day for 10 to 15 minutes at a time to help with inflammation and pain. - Continue to monitor symptoms for any changes.  If any escalation of current symptoms or symptoms follow-up for further evaluation management.

## 2023-12-02 ENCOUNTER — Ambulatory Visit (HOSPITAL_BASED_OUTPATIENT_CLINIC_OR_DEPARTMENT_OTHER)
Admission: RE | Admit: 2023-12-02 | Discharge: 2023-12-02 | Disposition: A | Discharge status: Home / Self Care | Source: Ambulatory Visit | Attending: Family Medicine | Admitting: Family Medicine

## 2023-12-02 DIAGNOSIS — R1903 Right lower quadrant abdominal swelling, mass and lump: Secondary | ICD-10-CM | POA: Diagnosis present

## 2023-12-05 ENCOUNTER — Ambulatory Visit: Payer: Self-pay | Admitting: Family Medicine

## 2023-12-09 ENCOUNTER — Encounter: Payer: Self-pay | Admitting: Family Medicine

## 2023-12-09 DIAGNOSIS — E119 Type 2 diabetes mellitus without complications: Secondary | ICD-10-CM

## 2023-12-09 MED ORDER — SEMAGLUTIDE (1 MG/DOSE) 4 MG/3ML ~~LOC~~ SOPN: 1.0000 mg | PEN_INJECTOR | SUBCUTANEOUS | 0 refills | Status: DC

## 2023-12-17 ENCOUNTER — Other Ambulatory Visit: Payer: Self-pay | Admitting: Hematology and Oncology

## 2023-12-22 ENCOUNTER — Other Ambulatory Visit: Payer: Self-pay | Admitting: Family Medicine

## 2023-12-22 ENCOUNTER — Ambulatory Visit (HOSPITAL_BASED_OUTPATIENT_CLINIC_OR_DEPARTMENT_OTHER)
Admission: RE | Admit: 2023-12-22 | Discharge: 2023-12-22 | Disposition: A | Discharge status: Home / Self Care | Source: Ambulatory Visit | Attending: Obstetrics & Gynecology | Admitting: Obstetrics & Gynecology

## 2023-12-22 DIAGNOSIS — M85852 Other specified disorders of bone density and structure, left thigh: Secondary | ICD-10-CM | POA: Diagnosis present

## 2023-12-22 DIAGNOSIS — Z1231 Encounter for screening mammogram for malignant neoplasm of breast: Secondary | ICD-10-CM

## 2023-12-23 ENCOUNTER — Encounter: Payer: Self-pay | Admitting: Oncology

## 2023-12-24 NOTE — Progress Notes (Signed)
   Acute Office Visit  Subjective:     Patient ID: Yolanda [name], female    DOB: 1960-12-14, 63 y.o.   MRN: [id number]  No chief complaint on file.   HPI   Yolanda [name] is a 63 year old female presents with concerns for new lump found on left breast.  Denies pain. She discovered a painless lump in her left breast during a self-exam. There is no associated nipple discharge, inversion, or other changes in the breast.   H/o breast cancer- right breast: s/p right breast upper outer quadrant biopsy 04/10/2020; right breast lumpectomy and sentinel lymph node dissection 05/21/2020. She is currently on anastrazole and follows with oncology.   Patient denies fever, chills, SOB, CP, palpitations, dyspnea, edema, HA, vision changes, N/V/D, abdominal pain, urinary symptoms, rash, weight changes, and recent illness or hospitalizations.     ROS  See hpi    Objective:    BP (!) 140/84   Pulse 80   Ht 5' 3 (1.6 m)   Wt 172 lb 6.4 oz (78.2 kg)   LMP 02/21/2014   SpO2 98%   BMI 30.54 kg/m    Physical Exam  General: No acute distress. Awake and conversant.  Eyes: Normal conjunctiva, anicteric. Round symmetric pupils.   Respiratory: Respirations are non-labored. No wheezing.  Skin: Warm. No rashes or ulcers.  +Breast Exam: B/L breasts large and dense, symmetrical, with no skin changes, nipple inversion, or discharge. Palpation reveals a small, non-tender, mass in the left breast, upper outer quadrant. No axillary or supraclavicular lymphadenopathy noted. Psych: Alert and oriented. Cooperative, Appropriate mood and affect, Normal judgment.  CV: RRR. No murmur. No lower extremity edema.  MSK: Normal ambulation. No clubbing or cyanosis.  Neuro:  CN II-XII grossly normal.    No results found for any visits on 12/28/23.      Assessment & Plan:   Problem List Items Addressed This Visit   None Visit Diagnoses       Breast nodule    -  Primary   Relevant Orders   MM 3D  DIAGNOSTIC MAMMOGRAM UNILATERAL LEFT BREAST     Mass of upper outer quadrant of left breast       Relevant Orders   MM 3D DIAGNOSTIC MAMMOGRAM UNILATERAL LEFT BREAST      Lump in left breast, upper outer quadrant Painless lump no nipple discharge or inversion. H/o right breast cancer, currently on anastrozole . - Ordered diagnostic mammogram- left breast - Refer to oncology if mammogram shows abnormalities for further evaluation,   No orders of the defined types were placed in this encounter.   No follow-ups on file.  Shamecka Hocutt L Oleta Gunnoe, NP

## 2023-12-26 ENCOUNTER — Ambulatory Visit (HOSPITAL_BASED_OUTPATIENT_CLINIC_OR_DEPARTMENT_OTHER): Payer: Self-pay | Admitting: Obstetrics & Gynecology

## 2023-12-28 ENCOUNTER — Encounter: Payer: Self-pay | Admitting: Student

## 2023-12-28 ENCOUNTER — Ambulatory Visit (INDEPENDENT_AMBULATORY_CARE_PROVIDER_SITE_OTHER): Admitting: Student

## 2023-12-28 VITALS — BP 148/74 | HR 80 | Ht 63.0 in | Wt 172.4 lb

## 2023-12-28 DIAGNOSIS — N6321 Unspecified lump in the left breast, upper outer quadrant: Secondary | ICD-10-CM | POA: Diagnosis not present

## 2023-12-28 DIAGNOSIS — N63 Unspecified lump in unspecified breast: Secondary | ICD-10-CM

## 2023-12-29 ENCOUNTER — Other Ambulatory Visit: Payer: Self-pay | Admitting: Student

## 2023-12-29 DIAGNOSIS — N63 Unspecified lump in unspecified breast: Secondary | ICD-10-CM

## 2023-12-29 DIAGNOSIS — N6321 Unspecified lump in the left breast, upper outer quadrant: Secondary | ICD-10-CM

## 2023-12-30 ENCOUNTER — Other Ambulatory Visit: Payer: Self-pay | Admitting: Student

## 2023-12-30 DIAGNOSIS — K219 Gastro-esophageal reflux disease without esophagitis: Secondary | ICD-10-CM

## 2023-12-31 ENCOUNTER — Ambulatory Visit
Admission: RE | Admit: 2023-12-31 | Discharge: 2023-12-31 | Disposition: A | Discharge status: Home / Self Care | Source: Ambulatory Visit | Attending: Student | Admitting: Student

## 2023-12-31 ENCOUNTER — Other Ambulatory Visit: Payer: Self-pay | Admitting: Student

## 2023-12-31 DIAGNOSIS — N6321 Unspecified lump in the left breast, upper outer quadrant: Secondary | ICD-10-CM

## 2023-12-31 DIAGNOSIS — N63 Unspecified lump in unspecified breast: Secondary | ICD-10-CM

## 2024-01-18 ENCOUNTER — Ambulatory Visit

## 2024-01-20 ENCOUNTER — Encounter: Payer: Self-pay | Admitting: Family Medicine

## 2024-01-20 ENCOUNTER — Other Ambulatory Visit: Payer: Self-pay | Admitting: Family

## 2024-01-20 DIAGNOSIS — G4733 Obstructive sleep apnea (adult) (pediatric): Secondary | ICD-10-CM

## 2024-02-13 NOTE — Assessment & Plan Note (Addendum)
 Following with endocrinology. hgba1c acceptable, minimize simple carbs. Increase exercise as tolerated. Continue current meds

## 2024-02-13 NOTE — Assessment & Plan Note (Signed)
 Well controlled, no changes to meds. Encouraged heart healthy diet such as the DASH diet and exercise as tolerated.

## 2024-02-13 NOTE — Progress Notes (Unsigned)
 "  Subjective:    Patient ID: Yolanda [name], female    DOB: [birth year], 63 y.o.   MRN: [id number]  No chief complaint on file.   HPI Discussed the use of AI scribe software for clinical note transcription with the patient, who gave verbal consent to proceed.  History of Present Illness     Past Medical History:  Diagnosis Date   Anemia May 2022   Chemotherapy   Asthma    with respiratory infection   Cancer [facility])    breast cancer - right   Cataract March 2018   Optometrist visit   Diabetes mellitus without complication (HCC)    type II   Fatty liver    Fibroid uterus    GERD (gastroesophageal reflux disease) June 2021   Hypertension    Patient reports that she has never been diagnosied with HTN, I take HCTZ for swelling in my feet, if needed.   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   BREAST LUMPECTOMY WITH SENTINEL LYMPH NODE BIOPSY Right 05/21/2020   Procedure: RIGHT BREAST LUMPECTOMY WITH SENTINEL LYMPH NODE BIOPSY;  Surgeon: Aron Shoulders, MD;  Location: [facility] OR;  Service: General;  Laterality: Right;   CATARACT EXTRACTION Left 07/02/2023   CHOLECYSTECTOMY  02/2009   COLONOSCOPY     DILATATION & CURRETTAGE/HYSTEROSCOPY WITH RESECTOCOPE N/A 03/13/2014   Procedure: DILATATION & CURETTAGE/HYSTEROSCOPY ;  Surgeon: Ronal Elvie Pinal, MD;  Location: [facility];  Service: Gynecology;  Laterality: N/A;   PORTA CATH REMOVAL  01/2021   PORTACATH PLACEMENT Left 05/21/2020   Procedure: INSERTION PORT-A-CATH;  Surgeon: Aron Shoulders,  MD;  Location: [facility] OR;  Service: General;  Laterality: Left;   RADIOACTIVE SEED GUIDED EXCISIONAL BREAST BIOPSY Left 12/30/2016   Procedure: RADIOACTIVE SEED GUIDED EXCISIONAL BREAST BIOPSY;  Surgeon: Aron Shoulders, MD;  Location: [facility];  Service: General;  Laterality: Left;    Family History  Problem Relation Age of Onset   Asthma Mother    Diabetes Mother    Diabetes Father    Heart attack Father    Heart disease Father    Heart Problems Sister    Asthma Sister    Hypertension Sister    Diabetes Brother    Diabetes Brother    Asthma Brother    Heart disease Maternal Grandfather     Social History   Socioeconomic History   Marital status: Married    Spouse name: Not on file   Number of children: Not on file   Years of education: Not on file   Highest education level: 12th grade  Occupational History   Not on file  Tobacco Use   Smoking status: Never    Passive exposure: Never   Smokeless tobacco: Never  Vaping Use   Vaping status: Never Used  Substance and Sexual Activity   Alcohol use: No   Drug use: No   Sexual activity: Not on file  Other Topics Concern   Not on file  Social History Narrative   Not on file   Social Drivers of Health   Tobacco Use: Low Risk (12/28/2023)   Patient History    Smoking Tobacco Use: Never    Smokeless Tobacco Use: Never    Passive Exposure: Never  Financial Resource Strain: Low Risk (12/24/2023)   Overall Financial Resource Strain (CARDIA)    Difficulty of Paying Living Expenses: Not hard at all  Food Insecurity: No Food Insecurity (12/24/2023)   Epic    Worried About Programme Researcher, Broadcasting/film/video in the Last Year: Never true    Ran Out of Food in the Last Year: Never true  Transportation Needs: No Transportation Needs (12/24/2023)   Epic    Lack of Transportation (Medical): No    Lack of Transportation (Non-Medical): No  Physical Activity: Insufficiently Active (12/24/2023)    Exercise Vital Sign    Days of Exercise per Week: 3 days    Minutes of Exercise per Session: 30 min  Stress: No Stress Concern Present (12/24/2023)   [employer] of Occupational Health - Occupational Stress Questionnaire    Feeling of Stress: Only a little  Social Connections: Socially Integrated (12/24/2023)   Social Connection and Isolation Panel    Frequency of Communication with Friends and Family: More than three times a week    Frequency of Social Gatherings with Friends and Family: More than three times a week    Attends Religious Services: More than 4 times per year    Active Member of [employer] or Organizations: Yes    Attends Banker Meetings: More than 4 times per year    Marital Status: Married  Catering Manager Violence: Not on file  Depression (PHQ2-9): Low Risk (12/28/2023)   Depression (PHQ2-9)    PHQ-2 Score: 0  Alcohol Screen: Not on file  Housing: Low Risk (12/24/2023)   Epic    Unable to Pay for Housing in the Last Year: No    Number of Times Moved in the Last Year: 0    Homeless in the Last Year: No  Utilities: Not on file  Health Literacy: Not on file    Outpatient Medications Prior to Visit  Medication Sig Dispense Refill   acetaminophen  (TYLENOL ) 500 MG tablet Take 500 mg by mouth every 6 (six) hours as needed for moderate pain.     acetic acid  2 % otic solution Place 6 drops into both ears every 3 (three) hours. 15 mL 1   albuterol  (VENTOLIN  HFA) 108 (90 Base) MCG/ACT inhaler Inhale 1-2 puffs into the lungs every 6 (six) hours as needed for wheezing or shortness of breath. 8 g 0   amLODipine  (NORVASC ) 5 MG tablet Take 1 tablet (5 mg total) by mouth daily. (Patient not taking: Reported on 10/27/2023) 90 tablet 1   anastrozole  (ARIMIDEX ) 1 MG tablet TAKE 1 TABLET BY MOUTH EVERY DAY 90 tablet 1   azelastine  (ASTELIN ) 0.1 % nasal spray Place 1 spray into both nostrils 2 (two) times daily. Use in each nostril as directed 30 mL 1    Blood Glucose Monitoring Suppl (ACCU-CHEK GUIDE) w/Device KIT 1 Device by Does not apply route daily in the afternoon. 1 kit 0   Cholecalciferol 125 MCG (5000 UT) capsule Take 5,000 Units by mouth daily.     glucose blood (ACCU-CHEK GUIDE TEST) test strip 1 each by Other route daily in the afternoon. Use as instructed 100 each 3   metFORMIN  (GLUCOPHAGE -XR) 500 MG 24 hr tablet Take 1 tablet (500 mg  total) by mouth 2 (two) times daily with a meal. 180 tablet 3   Multiple Vitamin (MULTIVITAMIN) LIQD Take 30 mLs by mouth every other day.     omeprazole  (PRILOSEC) 20 MG capsule Take 1 capsule (20 mg total) by mouth daily. 90 capsule 1   Polyethylene Glycol 400 (BLINK TEARS OP) Place 1 drop into both eyes daily as needed (dry eyes).     Semaglutide , 1 MG/DOSE, 4 MG/3ML SOPN Inject 1 mg as directed once a week. 9 mL 0   No facility-administered medications prior to visit.    Allergies[1]  Review of Systems  Constitutional:  Negative for fever and malaise/fatigue.  HENT:  Negative for congestion.   Eyes:  Negative for blurred vision.  Respiratory:  Negative for shortness of breath.   Cardiovascular:  Negative for chest pain, palpitations and leg swelling.  Gastrointestinal:  Negative for abdominal pain, blood in stool and nausea.  Genitourinary:  Negative for dysuria and frequency.  Musculoskeletal:  Negative for falls.  Skin:  Negative for rash.  Neurological:  Negative for dizziness, loss of consciousness and headaches.  Endo/Heme/Allergies:  Negative for environmental allergies.  Psychiatric/Behavioral:  Negative for depression. The patient is not nervous/anxious.        Objective:    Physical Exam Constitutional:      General: She is not in acute distress.    Appearance: Normal appearance. She is well-developed. She is not toxic-appearing.  HENT:     Head: Normocephalic and atraumatic.     Right Ear: External ear normal.     Left Ear: External ear normal.     Nose: Nose  normal.  Eyes:     General:        Right eye: No discharge.        Left eye: No discharge.     Conjunctiva/sclera: Conjunctivae normal.  Neck:     Thyroid : No thyromegaly.  Cardiovascular:     Rate and Rhythm: Normal rate and regular rhythm.     Heart sounds: Normal heart sounds. No murmur heard. Pulmonary:     Effort: Pulmonary effort is normal. No respiratory distress.     Breath sounds: Normal breath sounds.  Abdominal:     General: Bowel sounds are normal.     Palpations: Abdomen is soft.     Tenderness: There is no abdominal tenderness. There is no guarding.  Musculoskeletal:        General: Normal range of motion.     Cervical back: Neck supple.  Lymphadenopathy:     Cervical: No cervical adenopathy.  Skin:    General: Skin is warm and dry.  Neurological:     Mental Status: She is alert and oriented to person, place, and time.  Psychiatric:        Mood and Affect: Mood normal.        Behavior: Behavior normal.        Thought Content: Thought content normal.        Judgment: Judgment normal.    LMP 02/21/2014  Wt Readings from Last 3 Encounters:  12/28/23 172 lb 6.4 oz (78.2 kg)  10/27/23 171 lb 6.4 oz (77.7 kg)  09/23/23 172 lb 12.8 oz (78.4 kg)    Diabetic Foot Exam - Simple   No data filed    Lab Results  Component Value Date   WBC 6.7 08/26/2023   HGB 13.7 08/26/2023   HCT 42.4 08/26/2023   PLT 280.0 08/26/2023   GLUCOSE 96 09/23/2023  CHOL 217 (H) 08/26/2023   TRIG 195.0 (H) 08/26/2023   HDL 69.80 08/26/2023   LDLDIRECT 101.0 10/17/2020   LDLCALC 108 (H) 08/26/2023   ALT 25 09/23/2023   ALT 25 09/23/2023   AST 23 09/23/2023   AST 23 09/23/2023   NA 139 09/23/2023   K 4.7 09/23/2023   CL 101 09/23/2023   CREATININE 0.70 09/23/2023   BUN 12 09/23/2023   CO2 28 09/23/2023   TSH 1.95 08/26/2023   INR 0.9 02/12/2022   HGBA1C 6.1 (A) 10/27/2023   MICROALBUR 3.6 (H) 08/26/2023    Lab Results  Component Value Date   TSH 1.95 08/26/2023    Lab Results  Component Value Date   WBC 6.7 08/26/2023   HGB 13.7 08/26/2023   HCT 42.4 08/26/2023   MCV 91.2 08/26/2023   PLT 280.0 08/26/2023   Lab Results  Component Value Date   NA 139 09/23/2023   K 4.7 09/23/2023   CO2 28 09/23/2023   GLUCOSE 96 09/23/2023   BUN 12 09/23/2023   CREATININE 0.70 09/23/2023   BILITOT 0.3 09/23/2023   BILITOT 0.3 09/23/2023   ALKPHOS 127 (H) 09/23/2023   ALKPHOS 127 (H) 09/23/2023   AST 23 09/23/2023   AST 23 09/23/2023   ALT 25 09/23/2023   ALT 25 09/23/2023   PROT 6.9 09/23/2023   PROT 6.9 09/23/2023   ALBUMIN  4.1 09/23/2023   ALBUMIN  4.1 09/23/2023   CALCIUM  10.7 (H) 09/23/2023   ANIONGAP 6 04/14/2021   GFR 91.41 09/23/2023   Lab Results  Component Value Date   CHOL 217 (H) 08/26/2023   Lab Results  Component Value Date   HDL 69.80 08/26/2023   Lab Results  Component Value Date   LDLCALC 108 (H) 08/26/2023   Lab Results  Component Value Date   TRIG 195.0 (H) 08/26/2023   Lab Results  Component Value Date   CHOLHDL 3 08/26/2023   Lab Results  Component Value Date   HGBA1C 6.1 (A) 10/27/2023       Assessment & Plan:  Malignant neoplasm of upper-outer quadrant of right breast in female, estrogen receptor positive (HCC) Assessment & Plan: Tolerating Arimedex   Hyperlipidemia, unspecified hyperlipidemia type Assessment & Plan: Encourage heart healthy diet such as MIND or DASH diet, increase exercise, avoid trans fats, simple carbohydrates and processed foods, consider a krill or fish or flaxseed oil cap daily.     Essential hypertension Assessment & Plan: Well controlled, no changes to meds. Encouraged heart healthy diet such as the DASH diet and exercise as tolerated.     Type 2 diabetes mellitus in patient with obesity [facility]) Assessment & Plan: Following with endocrinology. hgba1c acceptable, minimize simple carbs. Increase exercise as tolerated. Continue current meds      Assessment and  Plan Assessment & Plan      Harlene Horton, MD     [1] Allergies Allergen Reactions   Lisinopril  Shortness Of Breath and Cough    wheezing   Rosuvastatin  Shortness Of Breath  "

## 2024-02-13 NOTE — Assessment & Plan Note (Signed)
Tolerating Arimedex. 

## 2024-02-13 NOTE — Assessment & Plan Note (Signed)
 Encourage heart healthy diet such as MIND or DASH diet, increase exercise, avoid trans fats, simple carbohydrates and processed foods, consider a krill or fish or flaxseed oil cap daily.

## 2024-02-14 ENCOUNTER — Ambulatory Visit: Admitting: Family Medicine

## 2024-02-14 ENCOUNTER — Encounter: Payer: Self-pay | Admitting: Family Medicine

## 2024-02-14 VITALS — BP 126/80 | HR 78 | Temp 98.3°F | Resp 16 | Ht 63.0 in | Wt 170.4 lb

## 2024-02-14 DIAGNOSIS — Z7985 Long-term (current) use of injectable non-insulin antidiabetic drugs: Secondary | ICD-10-CM | POA: Diagnosis not present

## 2024-02-14 DIAGNOSIS — K2100 Gastro-esophageal reflux disease with esophagitis, without bleeding: Secondary | ICD-10-CM

## 2024-02-14 DIAGNOSIS — K5730 Diverticulosis of large intestine without perforation or abscess without bleeding: Secondary | ICD-10-CM

## 2024-02-14 DIAGNOSIS — C50411 Malignant neoplasm of upper-outer quadrant of right female breast: Secondary | ICD-10-CM

## 2024-02-14 DIAGNOSIS — I10 Essential (primary) hypertension: Secondary | ICD-10-CM

## 2024-02-14 DIAGNOSIS — Z23 Encounter for immunization: Secondary | ICD-10-CM

## 2024-02-14 DIAGNOSIS — E669 Obesity, unspecified: Secondary | ICD-10-CM

## 2024-02-14 DIAGNOSIS — K760 Fatty (change of) liver, not elsewhere classified: Secondary | ICD-10-CM

## 2024-02-14 DIAGNOSIS — E785 Hyperlipidemia, unspecified: Secondary | ICD-10-CM

## 2024-02-14 DIAGNOSIS — K219 Gastro-esophageal reflux disease without esophagitis: Secondary | ICD-10-CM | POA: Diagnosis not present

## 2024-02-14 DIAGNOSIS — E1169 Type 2 diabetes mellitus with other specified complication: Secondary | ICD-10-CM | POA: Diagnosis not present

## 2024-02-14 DIAGNOSIS — Z170 Estrogen receptor positive status [ER+]: Secondary | ICD-10-CM | POA: Diagnosis not present

## 2024-02-14 LAB — MICROALBUMIN / CREATININE URINE RATIO
Creatinine,U: 102.6 mg/dL
Microalb Creat Ratio: 11.2 mg/g (ref 0.0–30.0)
Microalb, Ur: 1.1 mg/dL (ref 0.7–1.9)

## 2024-02-14 MED ORDER — OMEPRAZOLE 40 MG PO CPDR: 40.0000 mg | DELAYED_RELEASE_CAPSULE | Freq: Every day | ORAL | 3 refills | Status: DC

## 2024-02-14 MED ORDER — OMEPRAZOLE 40 MG PO CPDR: 40.0000 mg | DELAYED_RELEASE_CAPSULE | Freq: Two times a day (BID) | ORAL | 5 refills | Status: AC

## 2024-02-14 MED ORDER — SEMAGLUTIDE(0.25 OR 0.5MG/DOS) 2 MG/3ML ~~LOC~~ SOPN: 0.2500 mg | PEN_INJECTOR | SUBCUTANEOUS | 3 refills | Status: AC

## 2024-02-14 MED ORDER — HYOSCYAMINE SULFATE 0.125 MG SL SUBL: 0.1250 mg | SUBLINGUAL_TABLET | SUBLINGUAL | 0 refills | Status: AC | PRN

## 2024-02-14 NOTE — Patient Instructions (Signed)
 Heartburn Heartburn is when you feel pain or discomfort in your throat or chest. It may feel like a burning pain. It can also cause a bad, acid-like taste in your mouth. It may feel worse: When you lie down. When you bend over. At night. Heartburn may be caused by the contents in your stomach moving back up into your esophagus. Your esophagus is the part of your body that moves food from your mouth to your stomach. Follow these instructions at home: Eating and drinking Follow an eating plan as told by your health care provider. Avoid certain foods and drinks as told. These may include: Coffee and tea, with or without caffeine. Alcohol. Energy drinks and sports drinks. Fizzy drinks or sodas. Chocolate and cocoa. Peppermint and mint flavorings. Garlic and onions. Horseradish. Spicy and acidic foods. These include: Peppers. Chili powder and curry powder. Vinegar. Hot sauces and BBQ sauce. Citrus fruits and juices. These include: Oranges. Lemons. Limes. Tomato-based foods. These include: Red sauce and pizza with red sauce. Chili. Salsa. Fried and fatty foods. These include: Donuts. [place] fries. Potato chips. High-fat dressings. High-fat meats. These include: Hot dogs and sausage. Rib eye steak. Ham and bacon. High-fat dairy items. These include: Whole milk. Butter. Cream cheese. Eat small meals often. Avoid eating big meals. Avoid drinking lots of liquid with your meals. Try not to eat meals during the 2-3 hours before bedtime. Try not to lie down right after you eat. Do not exercise right after you eat. Lifestyle  If you're overweight, lose an amount of weight that's healthy for you. Ask your provider about a safe weight loss goal. Do not smoke, vape, or use nicotine or tobacco. These can make your symptoms worse. If you need help quitting, talk with your provider. Wear loose clothes. Do not wear things that are tight around your waist. When you sleep,  try: Raising the head of your bed about 6 inches (15 cm). You can use a wedge to do this. Lying down on your left side. Try to lower your stress. If you need help doing this, ask your provider. Medicines Take your medicines only as told by your provider. Do not take aspirin or ibuprofen unless you're told to. Stop medicines only as told by your provider. If you stop taking some medicines too quickly, your symptoms may get worse. General instructions Watch for any changes in your symptoms. Contact a health care provider if: You have new symptoms. You have weight loss for no known reason. You have trouble swallowing. It hurts to swallow. You have wheezing. This is when you make high-pitched whistling sounds when you breathe, most often when you breathe out. You have a cough that won't go away. Your symptoms don't get better with treatment. You have heartburn often for more than 2 weeks. Get help right away if: You have pain all of a sudden in your: Arms. Neck. Jaw. Teeth. Back. You feel sweaty, dizzy, or light-headed all of a sudden. You have chest pain or shortness of breath. You vomit and your vomit looks like blood or coffee grounds. Your poop is bloody or black. These symptoms may be an emergency. Call 911 right away. Do not wait to see if the symptoms will go away. Do not drive yourself to the hospital. This information is not intended to replace advice given to you by your health care provider. Make sure you discuss any questions you have with your health care provider. Document Revised: 12/15/2022 Document Reviewed: 06/26/2022 Elsevier Patient Education  2024 [employer]

## 2024-02-15 ENCOUNTER — Ambulatory Visit: Payer: Self-pay | Admitting: Family Medicine

## 2024-02-15 ENCOUNTER — Encounter: Payer: Self-pay | Admitting: Family Medicine

## 2024-02-15 LAB — CBC WITH DIFFERENTIAL/PLATELET
Basophils Absolute: 0.1 K/uL (ref 0.0–0.1)
Basophils Relative: 1.3 % (ref 0.0–3.0)
Eosinophils Absolute: 0.2 K/uL (ref 0.0–0.7)
Eosinophils Relative: 2.8 % (ref 0.0–5.0)
HCT: 37.8 % (ref 36.0–46.0)
Hemoglobin: 12.4 g/dL (ref 12.0–15.0)
Lymphocytes Relative: 37.2 % (ref 12.0–46.0)
Lymphs Abs: 3 K/uL (ref 0.7–4.0)
MCHC: 32.8 g/dL (ref 30.0–36.0)
MCV: 90.9 fl (ref 78.0–100.0)
Monocytes Absolute: 0.8 K/uL (ref 0.1–1.0)
Monocytes Relative: 9.5 % (ref 3.0–12.0)
Neutro Abs: 4 K/uL (ref 1.4–7.7)
Neutrophils Relative %: 49.2 % (ref 43.0–77.0)
Platelets: 236 K/uL (ref 150.0–400.0)
RBC: 4.16 Mil/uL (ref 3.87–5.11)
RDW: 14.3 % (ref 11.5–15.5)
WBC: 8.1 K/uL (ref 4.0–10.5)

## 2024-02-15 LAB — COMPREHENSIVE METABOLIC PANEL WITH GFR
ALT: 22 U/L (ref 3–35)
AST: 22 U/L (ref 5–37)
Albumin: 4 g/dL (ref 3.5–5.2)
Alkaline Phosphatase: 162 U/L — ABNORMAL HIGH (ref 39–117)
BUN: 17 mg/dL (ref 6–23)
CO2: 29 meq/L (ref 19–32)
Calcium: 10.7 mg/dL — ABNORMAL HIGH (ref 8.4–10.5)
Chloride: 103 meq/L (ref 96–112)
Creatinine, Ser: 0.72 mg/dL (ref 0.40–1.20)
GFR: 88.13 mL/min
Glucose, Bld: 83 mg/dL (ref 70–99)
Potassium: 4.4 meq/L (ref 3.5–5.1)
Sodium: 140 meq/L (ref 135–145)
Total Bilirubin: 0.2 mg/dL (ref 0.2–1.2)
Total Protein: 6.5 g/dL (ref 6.0–8.3)

## 2024-02-15 LAB — LIPID PANEL
Cholesterol: 195 mg/dL (ref 28–200)
HDL: 61.6 mg/dL
LDL Cholesterol: 115 mg/dL — ABNORMAL HIGH (ref 10–99)
NonHDL: 133.86
Total CHOL/HDL Ratio: 3
Triglycerides: 93 mg/dL (ref 10.0–149.0)
VLDL: 18.6 mg/dL (ref 0.0–40.0)

## 2024-02-15 LAB — HEMOGLOBIN A1C: Hgb A1c MFr Bld: 6.6 % — ABNORMAL HIGH (ref 4.6–6.5)

## 2024-02-15 LAB — TSH: TSH: 1.67 u[IU]/mL (ref 0.35–5.50)

## 2024-03-06 ENCOUNTER — Telehealth: Payer: Self-pay

## 2024-03-06 DIAGNOSIS — G4733 Obstructive sleep apnea (adult) (pediatric): Secondary | ICD-10-CM

## 2024-03-06 NOTE — Telephone Encounter (Signed)
 Patient was advised that sleep study was not approved and per Dr. Domenica she can place a referral to pulmonology, patient agrees with the referral.

## 2024-03-08 ENCOUNTER — Other Ambulatory Visit: Payer: Self-pay | Admitting: Family Medicine

## 2024-03-08 DIAGNOSIS — R5383 Other fatigue: Secondary | ICD-10-CM

## 2024-03-08 DIAGNOSIS — G4733 Obstructive sleep apnea (adult) (pediatric): Secondary | ICD-10-CM

## 2024-03-16 NOTE — Telephone Encounter (Signed)
 Open in error

## 2024-04-25 ENCOUNTER — Ambulatory Visit: Admitting: Internal Medicine

## 2024-06-01 ENCOUNTER — Ambulatory Visit: Admitting: Hematology and Oncology

## 2024-07-03 ENCOUNTER — Encounter: Admitting: Family Medicine

## 2024-07-05 ENCOUNTER — Encounter: Admitting: Student

## 2024-07-13 ENCOUNTER — Ambulatory Visit (HOSPITAL_BASED_OUTPATIENT_CLINIC_OR_DEPARTMENT_OTHER): Admitting: Obstetrics & Gynecology
